# Patient Record
Sex: Female | Born: 1980
Health system: Southern US, Community
[De-identification: ages and names within clinical notes are randomized; demographics above are authoritative.]

## PROBLEM LIST (undated history)

## (undated) ENCOUNTER — Inpatient Hospital Stay (HOSPITAL_COMMUNITY): Payer: Self-pay

## (undated) VITALS — BP 103/75 | HR 125 | Temp 98.0°F | Resp 16 | Ht 64.25 in | Wt 166.0 lb

## (undated) DIAGNOSIS — Z87410 Personal history of cervical dysplasia: Secondary | ICD-10-CM

## (undated) DIAGNOSIS — B977 Papillomavirus as the cause of diseases classified elsewhere: Secondary | ICD-10-CM

## (undated) DIAGNOSIS — F250 Schizoaffective disorder, bipolar type: Secondary | ICD-10-CM

## (undated) DIAGNOSIS — Z8619 Personal history of other infectious and parasitic diseases: Secondary | ICD-10-CM

## (undated) DIAGNOSIS — N926 Irregular menstruation, unspecified: Secondary | ICD-10-CM

## (undated) DIAGNOSIS — Z8489 Family history of other specified conditions: Secondary | ICD-10-CM

## (undated) DIAGNOSIS — J4520 Mild intermittent asthma, uncomplicated: Secondary | ICD-10-CM

## (undated) DIAGNOSIS — K219 Gastro-esophageal reflux disease without esophagitis: Secondary | ICD-10-CM

## (undated) DIAGNOSIS — F32A Depression, unspecified: Secondary | ICD-10-CM

## (undated) DIAGNOSIS — L309 Dermatitis, unspecified: Secondary | ICD-10-CM

## (undated) DIAGNOSIS — M5136 Other intervertebral disc degeneration, lumbar region: Secondary | ICD-10-CM

## (undated) DIAGNOSIS — J189 Pneumonia, unspecified organism: Secondary | ICD-10-CM

## (undated) DIAGNOSIS — Z973 Presence of spectacles and contact lenses: Secondary | ICD-10-CM

## (undated) DIAGNOSIS — M25861 Other specified joint disorders, right knee: Secondary | ICD-10-CM

## (undated) DIAGNOSIS — R011 Cardiac murmur, unspecified: Secondary | ICD-10-CM

## (undated) DIAGNOSIS — M25561 Pain in right knee: Secondary | ICD-10-CM

## (undated) DIAGNOSIS — F329 Major depressive disorder, single episode, unspecified: Secondary | ICD-10-CM

## (undated) DIAGNOSIS — M7050 Other bursitis of knee, unspecified knee: Secondary | ICD-10-CM

## (undated) DIAGNOSIS — M7918 Myalgia, other site: Secondary | ICD-10-CM

## (undated) DIAGNOSIS — K259 Gastric ulcer, unspecified as acute or chronic, without hemorrhage or perforation: Secondary | ICD-10-CM

## (undated) DIAGNOSIS — R569 Unspecified convulsions: Secondary | ICD-10-CM

## (undated) DIAGNOSIS — F1011 Alcohol abuse, in remission: Secondary | ICD-10-CM

## (undated) DIAGNOSIS — Z9851 Tubal ligation status: Secondary | ICD-10-CM

## (undated) DIAGNOSIS — F259 Schizoaffective disorder, unspecified: Secondary | ICD-10-CM

## (undated) DIAGNOSIS — G894 Chronic pain syndrome: Secondary | ICD-10-CM

## (undated) DIAGNOSIS — F988 Other specified behavioral and emotional disorders with onset usually occurring in childhood and adolescence: Secondary | ICD-10-CM

## (undated) DIAGNOSIS — R87610 Atypical squamous cells of undetermined significance on cytologic smear of cervix (ASC-US): Secondary | ICD-10-CM

## (undated) DIAGNOSIS — L700 Acne vulgaris: Secondary | ICD-10-CM

## (undated) DIAGNOSIS — F3160 Bipolar disorder, current episode mixed, unspecified: Secondary | ICD-10-CM

## (undated) DIAGNOSIS — M545 Low back pain: Secondary | ICD-10-CM

## (undated) DIAGNOSIS — M25511 Pain in right shoulder: Secondary | ICD-10-CM

## (undated) DIAGNOSIS — Z8669 Personal history of other diseases of the nervous system and sense organs: Secondary | ICD-10-CM

## (undated) DIAGNOSIS — F17200 Nicotine dependence, unspecified, uncomplicated: Secondary | ICD-10-CM

## (undated) DIAGNOSIS — N760 Acute vaginitis: Secondary | ICD-10-CM

## (undated) DIAGNOSIS — N870 Mild cervical dysplasia: Secondary | ICD-10-CM

## (undated) DIAGNOSIS — I10 Essential (primary) hypertension: Secondary | ICD-10-CM

## (undated) DIAGNOSIS — M47817 Spondylosis without myelopathy or radiculopathy, lumbosacral region: Secondary | ICD-10-CM

## (undated) DIAGNOSIS — M25562 Pain in left knee: Secondary | ICD-10-CM

## (undated) DIAGNOSIS — M25512 Pain in left shoulder: Secondary | ICD-10-CM

## (undated) DIAGNOSIS — IMO0002 Reserved for concepts with insufficient information to code with codable children: Secondary | ICD-10-CM

## (undated) DIAGNOSIS — M47816 Spondylosis without myelopathy or radiculopathy, lumbar region: Secondary | ICD-10-CM

## (undated) DIAGNOSIS — B9689 Other specified bacterial agents as the cause of diseases classified elsewhere: Secondary | ICD-10-CM

## (undated) DIAGNOSIS — F4310 Post-traumatic stress disorder, unspecified: Secondary | ICD-10-CM

## (undated) DIAGNOSIS — M1990 Unspecified osteoarthritis, unspecified site: Secondary | ICD-10-CM

## (undated) DIAGNOSIS — Z8709 Personal history of other diseases of the respiratory system: Secondary | ICD-10-CM

## (undated) DIAGNOSIS — Z8659 Personal history of other mental and behavioral disorders: Secondary | ICD-10-CM

## (undated) DIAGNOSIS — Z8742 Personal history of other diseases of the female genital tract: Secondary | ICD-10-CM

## (undated) DIAGNOSIS — G8929 Other chronic pain: Secondary | ICD-10-CM

## (undated) DIAGNOSIS — L209 Atopic dermatitis, unspecified: Secondary | ICD-10-CM

## (undated) DIAGNOSIS — N951 Menopausal and female climacteric states: Secondary | ICD-10-CM

## (undated) DIAGNOSIS — H5021 Vertical strabismus, right eye: Secondary | ICD-10-CM

## (undated) DIAGNOSIS — M4057 Lordosis, unspecified, lumbosacral region: Secondary | ICD-10-CM

## (undated) DIAGNOSIS — A5400 Gonococcal infection of lower genitourinary tract, unspecified: Principal | ICD-10-CM

## (undated) DIAGNOSIS — Z7251 High risk heterosexual behavior: Secondary | ICD-10-CM

## (undated) DIAGNOSIS — I639 Cerebral infarction, unspecified: Secondary | ICD-10-CM

## (undated) DIAGNOSIS — F411 Generalized anxiety disorder: Secondary | ICD-10-CM

## (undated) DIAGNOSIS — F1010 Alcohol abuse, uncomplicated: Secondary | ICD-10-CM

## (undated) DIAGNOSIS — M5442 Lumbago with sciatica, left side: Secondary | ICD-10-CM

## (undated) DIAGNOSIS — E663 Overweight: Secondary | ICD-10-CM

## (undated) DIAGNOSIS — G549 Nerve root and plexus disorder, unspecified: Secondary | ICD-10-CM

## (undated) DIAGNOSIS — N920 Excessive and frequent menstruation with regular cycle: Secondary | ICD-10-CM

## (undated) DIAGNOSIS — M479 Spondylosis, unspecified: Secondary | ICD-10-CM

## (undated) HISTORY — DX: Acute vaginitis: N76.0

## (undated) HISTORY — DX: Personal history of cervical dysplasia: Z87.410

## (undated) HISTORY — DX: Chronic pain syndrome: G89.4

## (undated) HISTORY — DX: Pain in left shoulder: M25.512

## (undated) HISTORY — DX: Lordosis, unspecified, lumbosacral region: M40.57

## (undated) HISTORY — DX: Other intervertebral disc degeneration, lumbar region: M51.36

## (undated) HISTORY — DX: Schizoaffective disorder, bipolar type: F25.0

## (undated) HISTORY — DX: Personal history of other mental and behavioral disorders: Z86.59

## (undated) HISTORY — DX: Gonococcal infection of lower genitourinary tract, unspecified: A54.00

## (undated) HISTORY — DX: Pain in right shoulder: M25.511

## (undated) HISTORY — DX: Excessive and frequent menstruation with regular cycle: N92.0

## (undated) HISTORY — DX: Nicotine dependence, unspecified, uncomplicated: F17.200

## (undated) HISTORY — DX: Papillomavirus as the cause of diseases classified elsewhere: B97.7

## (undated) HISTORY — DX: Other specified joint disorders, right knee: M25.861

## (undated) HISTORY — DX: Alcohol abuse, uncomplicated: F10.10

## (undated) HISTORY — DX: Atopic dermatitis, unspecified: L20.9

## (undated) HISTORY — DX: Irregular menstruation, unspecified: N92.6

## (undated) HISTORY — DX: Gastro-esophageal reflux disease without esophagitis: K21.9

## (undated) HISTORY — DX: Bipolar disorder, current episode mixed, unspecified: F31.60

## (undated) HISTORY — DX: Essential (primary) hypertension: I10

## (undated) HISTORY — DX: Pain in left knee: M25.562

## (undated) HISTORY — DX: Acne vulgaris: L70.0

## (undated) HISTORY — DX: Tubal ligation status: Z98.51

## (undated) HISTORY — DX: Personal history of other diseases of the female genital tract: Z87.42

## (undated) HISTORY — DX: Generalized anxiety disorder: F41.1

## (undated) HISTORY — DX: Other bursitis of knee, unspecified knee: M70.50

## (undated) HISTORY — DX: Reserved for concepts with insufficient information to code with codable children: IMO0002

## (undated) HISTORY — DX: Lumbago with sciatica, left side: M54.42

## (undated) HISTORY — DX: Pain in right knee: M25.561

## (undated) HISTORY — DX: Personal history of other diseases of the respiratory system: Z87.09

## (undated) HISTORY — DX: Myalgia, other site: M79.18

## (undated) HISTORY — DX: Nerve root and plexus disorder, unspecified: G54.9

## (undated) HISTORY — DX: Spondylosis without myelopathy or radiculopathy, lumbosacral region: M47.817

## (undated) HISTORY — DX: Vertical strabismus, right eye: H50.21

## (undated) HISTORY — DX: Other chronic pain: G89.29

## (undated) HISTORY — DX: Other specified bacterial agents as the cause of diseases classified elsewhere: B96.89

## (undated) HISTORY — DX: Spondylosis, unspecified: M47.9

## (undated) HISTORY — DX: Personal history of other infectious and parasitic diseases: Z86.19

## (undated) NOTE — *Deleted (*Deleted)
    SUBJECTIVE:   CHIEF COMPLAINT / HPI:   [name] is a 8 year old female who presents with urinary incontinence  Urinary incontinence Patient endorses ** history of urinary incontinence. Denies dysuria, frequency, hematuria, urge, voiding.  Has never seen PCP for this.  Has never been seen urology  PERTINENT  PMH / PSH: Hypertension, asthma, GERD, eczema  OBJECTIVE:   LMP 09/05/2016 Comment: tubal ligation   General: Alert, no acute distress HEENT: Neck non-tender without lymphadenopathy, masses or thyromegaly Cardio: Normal S1 and S2, no S3 or S4. Rhythm is regular***. No murmurs or rubs.   Pulm: Clear to auscultation bilaterally, no crackles, wheezing, or diminished breath sounds. Normal respiratory effort Abdomen: Bowel sounds normal. Abdomen soft and non-tender.  Extremities: No peripheral edema. Warm/ well perfused.  Strong radial and pedal pulses***. Neuro: Cranial nerves grossly intact  ASSESSMENT/PLAN:   No problem-specific Assessment & Plan notes found for this encounter.     Towanda Octave, MD [facility]

---
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
## 1999-01-14 ENCOUNTER — Ambulatory Visit (HOSPITAL_COMMUNITY): Admission: RE | Admit: 1999-01-14 | Discharge: 1999-01-14 | Payer: Self-pay | Admitting: *Deleted

## 1999-01-19 ENCOUNTER — Other Ambulatory Visit: Admission: RE | Admit: 1999-01-19 | Discharge: 1999-01-19 | Payer: Self-pay | Admitting: Obstetrics & Gynecology

## 1999-03-17 ENCOUNTER — Other Ambulatory Visit: Admission: RE | Admit: 1999-03-17 | Discharge: 1999-03-17 | Payer: Self-pay | Admitting: Obstetrics and Gynecology

## 1999-03-17 ENCOUNTER — Inpatient Hospital Stay (HOSPITAL_COMMUNITY): Admission: AD | Admit: 1999-03-17 | Discharge: 1999-03-17 | Payer: Self-pay | Admitting: Obstetrics & Gynecology

## 1999-03-30 ENCOUNTER — Inpatient Hospital Stay (HOSPITAL_COMMUNITY): Admission: AD | Admit: 1999-03-30 | Discharge: 1999-03-30 | Payer: Self-pay | Admitting: *Deleted

## 1999-05-20 ENCOUNTER — Inpatient Hospital Stay (HOSPITAL_COMMUNITY): Admission: AD | Admit: 1999-05-20 | Discharge: 1999-05-20 | Payer: Self-pay | Admitting: Obstetrics and Gynecology

## 1999-06-02 ENCOUNTER — Inpatient Hospital Stay (HOSPITAL_COMMUNITY): Admission: AD | Admit: 1999-06-02 | Discharge: 1999-06-05 | Payer: Self-pay | Admitting: Obstetrics and Gynecology

## 1999-08-14 HISTORY — PX: STRABISMUS SURGERY: SHX218

## 1999-08-15 ENCOUNTER — Inpatient Hospital Stay (HOSPITAL_COMMUNITY): Admission: AD | Admit: 1999-08-15 | Discharge: 1999-08-15 | Payer: Self-pay | Admitting: Obstetrics and Gynecology

## 1999-09-08 ENCOUNTER — Encounter: Admission: RE | Admit: 1999-09-08 | Discharge: 1999-09-08 | Payer: Self-pay | Admitting: Sports Medicine

## 1999-09-23 ENCOUNTER — Emergency Department (HOSPITAL_COMMUNITY): Admission: EM | Admit: 1999-09-23 | Discharge: 1999-09-23 | Payer: Self-pay | Admitting: Emergency Medicine

## 1999-09-24 ENCOUNTER — Encounter: Payer: Self-pay | Admitting: Emergency Medicine

## 1999-09-25 ENCOUNTER — Encounter: Admission: RE | Admit: 1999-09-25 | Discharge: 1999-09-25 | Payer: Self-pay | Admitting: Family Medicine

## 1999-11-10 ENCOUNTER — Encounter: Admission: RE | Admit: 1999-11-10 | Discharge: 1999-11-10 | Payer: Self-pay | Admitting: Sports Medicine

## 1999-11-13 ENCOUNTER — Encounter: Admission: RE | Admit: 1999-11-13 | Discharge: 1999-11-13 | Payer: Self-pay | Admitting: Family Medicine

## 1999-11-18 ENCOUNTER — Encounter: Admission: RE | Admit: 1999-11-18 | Discharge: 1999-11-18 | Payer: Self-pay | Admitting: Family Medicine

## 1999-12-08 ENCOUNTER — Encounter: Admission: RE | Admit: 1999-12-08 | Discharge: 1999-12-08 | Payer: Self-pay | Admitting: Family Medicine

## 1999-12-21 ENCOUNTER — Encounter: Admission: RE | Admit: 1999-12-21 | Discharge: 1999-12-21 | Payer: Self-pay | Admitting: Family Medicine

## 2000-01-23 ENCOUNTER — Inpatient Hospital Stay (HOSPITAL_COMMUNITY): Admission: AD | Admit: 2000-01-23 | Discharge: 2000-01-23 | Payer: Self-pay | Admitting: *Deleted

## 2000-02-10 ENCOUNTER — Encounter: Admission: RE | Admit: 2000-02-10 | Discharge: 2000-02-10 | Payer: Self-pay | Admitting: Family Medicine

## 2000-03-09 ENCOUNTER — Encounter: Admission: RE | Admit: 2000-03-09 | Discharge: 2000-03-09 | Payer: Self-pay | Admitting: Family Medicine

## 2000-03-16 ENCOUNTER — Encounter: Admission: RE | Admit: 2000-03-16 | Discharge: 2000-03-16 | Payer: Self-pay | Admitting: Family Medicine

## 2000-04-04 ENCOUNTER — Emergency Department (HOSPITAL_COMMUNITY): Admission: EM | Admit: 2000-04-04 | Discharge: 2000-04-05 | Payer: Self-pay | Admitting: Emergency Medicine

## 2000-06-16 ENCOUNTER — Encounter: Admission: RE | Admit: 2000-06-16 | Discharge: 2000-06-16 | Payer: Self-pay | Admitting: Sports Medicine

## 2000-06-21 ENCOUNTER — Encounter: Admission: RE | Admit: 2000-06-21 | Discharge: 2000-06-21 | Payer: Self-pay | Admitting: Family Medicine

## 2000-06-23 ENCOUNTER — Encounter: Admission: RE | Admit: 2000-06-23 | Discharge: 2000-06-23 | Payer: Self-pay | Admitting: Family Medicine

## 2000-07-20 ENCOUNTER — Encounter: Admission: RE | Admit: 2000-07-20 | Discharge: 2000-07-20 | Payer: Self-pay | Admitting: Family Medicine

## 2000-07-20 ENCOUNTER — Other Ambulatory Visit: Admission: RE | Admit: 2000-07-20 | Discharge: 2000-07-20 | Payer: Self-pay | Admitting: Family Medicine

## 2000-07-29 ENCOUNTER — Encounter: Admission: RE | Admit: 2000-07-29 | Discharge: 2000-07-29 | Payer: Self-pay | Admitting: Family Medicine

## 2000-09-01 ENCOUNTER — Encounter: Admission: RE | Admit: 2000-09-01 | Discharge: 2000-09-01 | Payer: Self-pay | Admitting: Family Medicine

## 2000-09-13 ENCOUNTER — Encounter: Admission: RE | Admit: 2000-09-13 | Discharge: 2000-09-13 | Payer: Self-pay | Admitting: Family Medicine

## 2000-10-31 ENCOUNTER — Encounter: Admission: RE | Admit: 2000-10-31 | Discharge: 2000-10-31 | Payer: Self-pay | Admitting: Family Medicine

## 2000-11-02 ENCOUNTER — Encounter: Admission: RE | Admit: 2000-11-02 | Discharge: 2001-01-09 | Payer: Self-pay | Admitting: Family Medicine

## 2000-11-17 ENCOUNTER — Encounter: Admission: RE | Admit: 2000-11-17 | Discharge: 2000-11-17 | Payer: Self-pay | Admitting: Family Medicine

## 2000-11-24 ENCOUNTER — Encounter: Admission: RE | Admit: 2000-11-24 | Discharge: 2000-11-24 | Payer: Self-pay | Admitting: Family Medicine

## 2001-01-24 ENCOUNTER — Encounter: Admission: RE | Admit: 2001-01-24 | Discharge: 2001-01-24 | Payer: Self-pay | Admitting: Family Medicine

## 2001-01-27 ENCOUNTER — Encounter: Admission: RE | Admit: 2001-01-27 | Discharge: 2001-01-27 | Payer: Self-pay | Admitting: Family Medicine

## 2001-02-27 ENCOUNTER — Encounter: Admission: RE | Admit: 2001-02-27 | Discharge: 2001-02-27 | Payer: Self-pay | Admitting: Family Medicine

## 2001-03-30 ENCOUNTER — Encounter: Admission: RE | Admit: 2001-03-30 | Discharge: 2001-03-30 | Payer: Self-pay | Admitting: Family Medicine

## 2001-05-15 ENCOUNTER — Encounter: Admission: RE | Admit: 2001-05-15 | Discharge: 2001-05-15 | Payer: Self-pay | Admitting: Family Medicine

## 2001-05-24 ENCOUNTER — Emergency Department (HOSPITAL_COMMUNITY): Admission: EM | Admit: 2001-05-24 | Discharge: 2001-05-24 | Payer: Self-pay | Admitting: Emergency Medicine

## 2001-05-28 ENCOUNTER — Emergency Department (HOSPITAL_COMMUNITY): Admission: EM | Admit: 2001-05-28 | Discharge: 2001-05-28 | Payer: Self-pay | Admitting: Emergency Medicine

## 2001-07-03 ENCOUNTER — Ambulatory Visit: Admission: RE | Admit: 2001-07-03 | Discharge: 2001-07-03 | Payer: Self-pay | Admitting: Family Medicine

## 2001-07-03 ENCOUNTER — Encounter: Admission: RE | Admit: 2001-07-03 | Discharge: 2001-07-03 | Payer: Self-pay | Admitting: Family Medicine

## 2001-11-29 ENCOUNTER — Encounter: Payer: Self-pay | Admitting: Emergency Medicine

## 2001-11-29 ENCOUNTER — Emergency Department (HOSPITAL_COMMUNITY): Admission: EM | Admit: 2001-11-29 | Discharge: 2001-11-29 | Payer: Self-pay | Admitting: Emergency Medicine

## 2002-02-12 ENCOUNTER — Encounter: Admission: RE | Admit: 2002-02-12 | Discharge: 2002-02-12 | Payer: Self-pay | Admitting: Family Medicine

## 2002-03-21 ENCOUNTER — Encounter: Admission: RE | Admit: 2002-03-21 | Discharge: 2002-03-21 | Payer: Self-pay | Admitting: Family Medicine

## 2002-04-05 ENCOUNTER — Encounter: Admission: RE | Admit: 2002-04-05 | Discharge: 2002-04-05 | Payer: Self-pay | Admitting: Family Medicine

## 2002-05-04 ENCOUNTER — Encounter: Admission: RE | Admit: 2002-05-04 | Discharge: 2002-05-04 | Payer: Self-pay | Admitting: Family Medicine

## 2002-05-14 ENCOUNTER — Encounter: Admission: RE | Admit: 2002-05-14 | Discharge: 2002-05-14 | Payer: Self-pay | Admitting: Family Medicine

## 2002-06-19 ENCOUNTER — Encounter (INDEPENDENT_AMBULATORY_CARE_PROVIDER_SITE_OTHER): Payer: Self-pay | Admitting: *Deleted

## 2002-06-19 ENCOUNTER — Encounter: Admission: RE | Admit: 2002-06-19 | Discharge: 2002-06-19 | Payer: Self-pay | Admitting: Family Medicine

## 2002-06-21 ENCOUNTER — Encounter: Admission: RE | Admit: 2002-06-21 | Discharge: 2002-06-21 | Payer: Self-pay | Admitting: Family Medicine

## 2002-06-28 ENCOUNTER — Encounter: Admission: RE | Admit: 2002-06-28 | Discharge: 2002-06-28 | Payer: Self-pay | Admitting: Family Medicine

## 2002-07-19 ENCOUNTER — Encounter: Admission: RE | Admit: 2002-07-19 | Discharge: 2002-07-19 | Payer: Self-pay | Admitting: Family Medicine

## 2002-08-09 ENCOUNTER — Encounter: Admission: RE | Admit: 2002-08-09 | Discharge: 2002-08-09 | Payer: Self-pay | Admitting: Family Medicine

## 2002-09-06 ENCOUNTER — Encounter: Admission: RE | Admit: 2002-09-06 | Discharge: 2002-09-06 | Payer: Self-pay | Admitting: Family Medicine

## 2002-10-16 ENCOUNTER — Encounter: Admission: RE | Admit: 2002-10-16 | Discharge: 2002-10-16 | Payer: Self-pay | Admitting: Family Medicine

## 2002-11-06 ENCOUNTER — Encounter: Admission: RE | Admit: 2002-11-06 | Discharge: 2002-11-06 | Payer: Self-pay | Admitting: Sports Medicine

## 2002-11-27 ENCOUNTER — Encounter: Admission: RE | Admit: 2002-11-27 | Discharge: 2002-11-27 | Payer: Self-pay | Admitting: Sports Medicine

## 2002-11-29 ENCOUNTER — Encounter: Admission: RE | Admit: 2002-11-29 | Discharge: 2002-11-29 | Payer: Self-pay | Admitting: Family Medicine

## 2003-01-22 ENCOUNTER — Encounter: Admission: RE | Admit: 2003-01-22 | Discharge: 2003-01-22 | Payer: Self-pay | Admitting: Family Medicine

## 2003-02-04 ENCOUNTER — Encounter: Admission: RE | Admit: 2003-02-04 | Discharge: 2003-02-04 | Payer: Self-pay | Admitting: Family Medicine

## 2003-02-11 ENCOUNTER — Encounter: Admission: RE | Admit: 2003-02-11 | Discharge: 2003-02-11 | Payer: Self-pay | Admitting: Family Medicine

## 2003-04-12 ENCOUNTER — Encounter: Admission: RE | Admit: 2003-04-12 | Discharge: 2003-04-12 | Payer: Self-pay | Admitting: Sports Medicine

## 2003-05-16 ENCOUNTER — Encounter: Admission: RE | Admit: 2003-05-16 | Discharge: 2003-05-16 | Payer: Self-pay | Admitting: Family Medicine

## 2003-07-05 ENCOUNTER — Encounter: Admission: RE | Admit: 2003-07-05 | Discharge: 2003-07-05 | Payer: Self-pay | Admitting: Family Medicine

## 2003-07-25 ENCOUNTER — Emergency Department (HOSPITAL_COMMUNITY): Admission: EM | Admit: 2003-07-25 | Discharge: 2003-07-25 | Payer: Self-pay | Admitting: Family Medicine

## 2003-12-02 ENCOUNTER — Inpatient Hospital Stay (HOSPITAL_COMMUNITY): Admission: AD | Admit: 2003-12-02 | Discharge: 2003-12-02 | Payer: Self-pay | Admitting: Obstetrics & Gynecology

## 2003-12-23 ENCOUNTER — Ambulatory Visit: Payer: Self-pay | Admitting: Family Medicine

## 2003-12-24 ENCOUNTER — Inpatient Hospital Stay (HOSPITAL_COMMUNITY): Admission: AD | Admit: 2003-12-24 | Discharge: 2003-12-24 | Payer: Self-pay | Admitting: *Deleted

## 2004-01-15 ENCOUNTER — Inpatient Hospital Stay (HOSPITAL_COMMUNITY): Admission: AD | Admit: 2004-01-15 | Discharge: 2004-01-15 | Payer: Self-pay | Admitting: Obstetrics and Gynecology

## 2004-01-21 ENCOUNTER — Ambulatory Visit: Payer: Self-pay | Admitting: Family Medicine

## 2004-02-04 ENCOUNTER — Ambulatory Visit: Payer: Self-pay | Admitting: Family Medicine

## 2004-02-20 ENCOUNTER — Ambulatory Visit: Payer: Self-pay | Admitting: Family Medicine

## 2004-02-27 ENCOUNTER — Ambulatory Visit (HOSPITAL_COMMUNITY): Admission: RE | Admit: 2004-02-27 | Discharge: 2004-02-27 | Payer: Self-pay | Admitting: Family Medicine

## 2004-03-10 ENCOUNTER — Inpatient Hospital Stay (HOSPITAL_COMMUNITY): Admission: AD | Admit: 2004-03-10 | Discharge: 2004-03-10 | Payer: Self-pay | Admitting: Obstetrics and Gynecology

## 2004-04-03 ENCOUNTER — Ambulatory Visit: Payer: Self-pay | Admitting: Family Medicine

## 2004-04-03 ENCOUNTER — Inpatient Hospital Stay (HOSPITAL_COMMUNITY): Admission: AD | Admit: 2004-04-03 | Discharge: 2004-04-06 | Payer: Self-pay | Admitting: *Deleted

## 2004-04-03 ENCOUNTER — Ambulatory Visit: Payer: Self-pay | Admitting: *Deleted

## 2004-04-07 ENCOUNTER — Ambulatory Visit: Payer: Self-pay | Admitting: Family Medicine

## 2004-04-20 ENCOUNTER — Inpatient Hospital Stay (HOSPITAL_COMMUNITY): Admission: AD | Admit: 2004-04-20 | Discharge: 2004-04-23 | Payer: Self-pay | Admitting: Obstetrics and Gynecology

## 2004-04-20 ENCOUNTER — Ambulatory Visit: Payer: Self-pay | Admitting: Family Medicine

## 2004-04-28 ENCOUNTER — Ambulatory Visit: Payer: Self-pay | Admitting: Family Medicine

## 2004-04-29 ENCOUNTER — Ambulatory Visit: Payer: Self-pay | Admitting: Sports Medicine

## 2004-05-01 ENCOUNTER — Encounter (INDEPENDENT_AMBULATORY_CARE_PROVIDER_SITE_OTHER): Payer: Self-pay | Admitting: *Deleted

## 2004-05-01 ENCOUNTER — Inpatient Hospital Stay (HOSPITAL_COMMUNITY): Admission: AD | Admit: 2004-05-01 | Discharge: 2004-05-03 | Payer: Self-pay | Admitting: Obstetrics and Gynecology

## 2004-05-06 ENCOUNTER — Inpatient Hospital Stay (HOSPITAL_COMMUNITY): Admission: AD | Admit: 2004-05-06 | Discharge: 2004-05-06 | Payer: Self-pay | Admitting: Obstetrics and Gynecology

## 2004-05-06 ENCOUNTER — Ambulatory Visit: Payer: Self-pay | Admitting: Family Medicine

## 2004-06-22 ENCOUNTER — Other Ambulatory Visit: Admission: RE | Admit: 2004-06-22 | Discharge: 2004-06-22 | Payer: Self-pay | Admitting: Family Medicine

## 2004-06-22 ENCOUNTER — Ambulatory Visit: Payer: Self-pay | Admitting: Sports Medicine

## 2004-11-19 ENCOUNTER — Ambulatory Visit: Payer: Self-pay | Admitting: Family Medicine

## 2004-11-26 ENCOUNTER — Ambulatory Visit: Payer: Self-pay | Admitting: Family Medicine

## 2004-12-21 ENCOUNTER — Ambulatory Visit: Payer: Self-pay | Admitting: Family Medicine

## 2004-12-21 ENCOUNTER — Other Ambulatory Visit: Admission: RE | Admit: 2004-12-21 | Discharge: 2004-12-21 | Payer: Self-pay | Admitting: Family Medicine

## 2004-12-24 ENCOUNTER — Ambulatory Visit: Payer: Self-pay | Admitting: Family Medicine

## 2005-01-28 ENCOUNTER — Ambulatory Visit: Payer: Self-pay | Admitting: Family Medicine

## 2005-03-01 ENCOUNTER — Ambulatory Visit: Payer: Self-pay | Admitting: Family Medicine

## 2005-03-05 ENCOUNTER — Ambulatory Visit: Payer: Self-pay | Admitting: Family Medicine

## 2005-04-10 ENCOUNTER — Emergency Department (HOSPITAL_COMMUNITY): Admission: EM | Admit: 2005-04-10 | Discharge: 2005-04-10 | Payer: Self-pay | Admitting: Podiatry

## 2005-04-19 ENCOUNTER — Emergency Department (HOSPITAL_COMMUNITY): Admission: EM | Admit: 2005-04-19 | Discharge: 2005-04-19 | Payer: Self-pay | Admitting: Family Medicine

## 2005-09-24 ENCOUNTER — Emergency Department (HOSPITAL_COMMUNITY): Admission: EM | Admit: 2005-09-24 | Discharge: 2005-09-24 | Payer: Self-pay | Admitting: Family Medicine

## 2005-10-13 ENCOUNTER — Encounter (INDEPENDENT_AMBULATORY_CARE_PROVIDER_SITE_OTHER): Payer: Self-pay | Admitting: *Deleted

## 2005-10-28 ENCOUNTER — Ambulatory Visit: Payer: Self-pay | Admitting: Family Medicine

## 2005-10-28 ENCOUNTER — Encounter (INDEPENDENT_AMBULATORY_CARE_PROVIDER_SITE_OTHER): Payer: Self-pay | Admitting: *Deleted

## 2005-11-01 ENCOUNTER — Emergency Department (HOSPITAL_COMMUNITY): Admission: EM | Admit: 2005-11-01 | Discharge: 2005-11-01 | Payer: Self-pay | Admitting: Emergency Medicine

## 2005-11-01 ENCOUNTER — Ambulatory Visit: Payer: Self-pay | Admitting: Family Medicine

## 2005-11-08 ENCOUNTER — Ambulatory Visit: Payer: Self-pay | Admitting: Family Medicine

## 2005-11-23 ENCOUNTER — Encounter: Admission: RE | Admit: 2005-11-23 | Discharge: 2005-11-23 | Payer: Self-pay | Admitting: Sports Medicine

## 2005-11-23 ENCOUNTER — Ambulatory Visit: Payer: Self-pay | Admitting: Sports Medicine

## 2005-12-02 ENCOUNTER — Ambulatory Visit: Payer: Self-pay | Admitting: Family Medicine

## 2006-02-02 ENCOUNTER — Ambulatory Visit: Payer: Self-pay | Admitting: Family Medicine

## 2006-02-21 ENCOUNTER — Emergency Department (HOSPITAL_COMMUNITY): Admission: EM | Admit: 2006-02-21 | Discharge: 2006-02-21 | Payer: Self-pay | Admitting: Emergency Medicine

## 2006-04-26 ENCOUNTER — Encounter (INDEPENDENT_AMBULATORY_CARE_PROVIDER_SITE_OTHER): Payer: Self-pay | Admitting: *Deleted

## 2006-04-26 ENCOUNTER — Ambulatory Visit: Payer: Self-pay | Admitting: Family Medicine

## 2006-04-26 LAB — CONVERTED CEMR LAB
Albumin: 4.7 g/dL (ref 3.5–5.2)
BUN: 6 mg/dL (ref 6–23)
Calcium: 9.5 mg/dL (ref 8.4–10.5)
Chloride: 97 meq/L (ref 96–112)
Creatinine, Ser: 0.78 mg/dL (ref 0.40–1.20)
Glucose, Bld: 72 mg/dL (ref 70–99)
Potassium: 3.5 meq/L (ref 3.5–5.3)
Prealbumin: 17.7 mg/dL — ABNORMAL LOW (ref 18.0–45.0)
Sodium: 137 meq/L (ref 135–145)
Total Protein: 7.5 g/dL (ref 6.0–8.3)

## 2006-05-12 ENCOUNTER — Encounter (INDEPENDENT_AMBULATORY_CARE_PROVIDER_SITE_OTHER): Payer: Self-pay | Admitting: Family Medicine

## 2006-05-12 ENCOUNTER — Other Ambulatory Visit: Admission: RE | Admit: 2006-05-12 | Discharge: 2006-05-12 | Payer: Self-pay | Admitting: Family Medicine

## 2006-05-12 ENCOUNTER — Encounter: Payer: Self-pay | Admitting: Family Medicine

## 2006-05-12 ENCOUNTER — Ambulatory Visit: Payer: Self-pay | Admitting: Family Medicine

## 2006-05-12 DIAGNOSIS — M47816 Spondylosis without myelopathy or radiculopathy, lumbar region: Secondary | ICD-10-CM | POA: Insufficient documentation

## 2006-05-12 DIAGNOSIS — Z8709 Personal history of other diseases of the respiratory system: Secondary | ICD-10-CM

## 2006-05-12 DIAGNOSIS — Z720 Tobacco use: Secondary | ICD-10-CM | POA: Insufficient documentation

## 2006-05-12 DIAGNOSIS — M479 Spondylosis, unspecified: Secondary | ICD-10-CM

## 2006-05-12 DIAGNOSIS — J4520 Mild intermittent asthma, uncomplicated: Secondary | ICD-10-CM | POA: Insufficient documentation

## 2006-05-12 DIAGNOSIS — F988 Other specified behavioral and emotional disorders with onset usually occurring in childhood and adolescence: Secondary | ICD-10-CM | POA: Insufficient documentation

## 2006-05-12 DIAGNOSIS — Z8659 Personal history of other mental and behavioral disorders: Secondary | ICD-10-CM | POA: Insufficient documentation

## 2006-05-12 DIAGNOSIS — F17200 Nicotine dependence, unspecified, uncomplicated: Secondary | ICD-10-CM

## 2006-05-12 HISTORY — DX: Spondylosis without myelopathy or radiculopathy, lumbar region: M47.816

## 2006-05-12 HISTORY — DX: Personal history of other mental and behavioral disorders: Z86.59

## 2006-05-12 HISTORY — DX: Spondylosis, unspecified: M47.9

## 2006-05-12 HISTORY — DX: Other specified behavioral and emotional disorders with onset usually occurring in childhood and adolescence: F98.8

## 2006-05-12 HISTORY — DX: Nicotine dependence, unspecified, uncomplicated: F17.200

## 2006-05-12 HISTORY — DX: Personal history of other diseases of the respiratory system: Z87.09

## 2006-05-12 LAB — CONVERTED CEMR LAB
AST: 11 units/L (ref 0–37)
Alkaline Phosphatase: 53 units/L (ref 39–117)
BUN: 9 mg/dL (ref 6–23)
CO2: 29 meq/L (ref 19–32)
Chlamydia, DNA Probe: NEGATIVE
Glucose, Bld: 79 mg/dL (ref 70–99)
Pap Smear: NORMAL
Sodium: 139 meq/L (ref 135–145)
Total Bilirubin: 0.7 mg/dL (ref 0.3–1.2)
Total Protein: 7.3 g/dL (ref 6.0–8.3)

## 2006-05-13 ENCOUNTER — Encounter (INDEPENDENT_AMBULATORY_CARE_PROVIDER_SITE_OTHER): Payer: Self-pay | Admitting: *Deleted

## 2006-05-13 ENCOUNTER — Ambulatory Visit (HOSPITAL_COMMUNITY): Admission: RE | Admit: 2006-05-13 | Discharge: 2006-05-13 | Payer: Self-pay | Admitting: Family Medicine

## 2006-05-18 ENCOUNTER — Telehealth: Payer: Self-pay | Admitting: Family Medicine

## 2006-05-24 ENCOUNTER — Ambulatory Visit: Payer: Self-pay | Admitting: Family Medicine

## 2006-05-25 ENCOUNTER — Encounter: Payer: Self-pay | Admitting: Family Medicine

## 2006-05-25 ENCOUNTER — Telehealth: Payer: Self-pay | Admitting: *Deleted

## 2006-05-25 LAB — CONVERTED CEMR LAB
Amphetamine Screen, Ur: NEGATIVE
Barbiturate Quant, Ur: NEGATIVE
Benzodiazepines.: NEGATIVE
Methadone: NEGATIVE
Phencyclidine (PCP): NEGATIVE
Propoxyphene: NEGATIVE

## 2006-05-26 ENCOUNTER — Ambulatory Visit: Payer: Self-pay | Admitting: Family Medicine

## 2006-06-14 HISTORY — PX: COLONOSCOPY: SHX174

## 2006-06-14 HISTORY — PX: ESOPHAGOGASTRODUODENOSCOPY: SHX1529

## 2006-06-20 ENCOUNTER — Encounter: Payer: Self-pay | Admitting: Family Medicine

## 2006-06-20 DIAGNOSIS — Z8619 Personal history of other infectious and parasitic diseases: Secondary | ICD-10-CM

## 2006-06-20 DIAGNOSIS — N72 Inflammatory disease of cervix uteri: Secondary | ICD-10-CM | POA: Insufficient documentation

## 2006-06-20 DIAGNOSIS — A5403 Gonococcal cervicitis, unspecified: Secondary | ICD-10-CM | POA: Insufficient documentation

## 2006-06-20 HISTORY — DX: Personal history of other infectious and parasitic diseases: Z86.19

## 2006-06-23 ENCOUNTER — Ambulatory Visit: Payer: Self-pay | Admitting: Internal Medicine

## 2006-07-01 ENCOUNTER — Encounter: Payer: Self-pay | Admitting: Family Medicine

## 2006-07-01 ENCOUNTER — Ambulatory Visit (HOSPITAL_COMMUNITY): Admission: RE | Admit: 2006-07-01 | Discharge: 2006-07-01 | Payer: Self-pay | Admitting: Internal Medicine

## 2006-07-12 ENCOUNTER — Ambulatory Visit: Payer: Self-pay | Admitting: Family Medicine

## 2006-07-12 ENCOUNTER — Telehealth: Payer: Self-pay | Admitting: *Deleted

## 2006-07-12 ENCOUNTER — Encounter (INDEPENDENT_AMBULATORY_CARE_PROVIDER_SITE_OTHER): Payer: Self-pay | Admitting: Family Medicine

## 2006-07-12 LAB — CONVERTED CEMR LAB
Albumin: 4.6 g/dL (ref 3.5–5.2)
BUN: 6 mg/dL (ref 6–23)
Calcium: 9 mg/dL (ref 8.4–10.5)
Chloride: 102 meq/L (ref 96–112)
Eosinophils Absolute: 0.7 10*3/uL
Glucose, Bld: 62 mg/dL — ABNORMAL LOW (ref 70–99)
Granulocyte percent: 50.7 %
HCT: 41.5 %
Lipase: <10 units/L (ref 0–75)
Lymphs Abs: 3.4 10*3/uL
Monocytes Relative: 3.4 %
Platelets: 222 10*3/uL
Potassium: 3.8 meq/L (ref 3.5–5.3)
WBC: 7.3 10*3/uL

## 2006-07-14 ENCOUNTER — Ambulatory Visit: Payer: Self-pay | Admitting: Sports Medicine

## 2006-07-19 ENCOUNTER — Encounter: Payer: Self-pay | Admitting: Family Medicine

## 2006-08-04 ENCOUNTER — Telehealth: Payer: Self-pay | Admitting: *Deleted

## 2006-08-09 ENCOUNTER — Telehealth: Payer: Self-pay | Admitting: *Deleted

## 2006-08-10 ENCOUNTER — Ambulatory Visit: Payer: Self-pay | Admitting: Family Medicine

## 2006-08-10 LAB — CONVERTED CEMR LAB: Beta hcg, urine, semiquantitative: NEGATIVE

## 2006-08-15 ENCOUNTER — Ambulatory Visit: Payer: Self-pay | Admitting: Family Medicine

## 2006-08-16 ENCOUNTER — Encounter: Admission: RE | Admit: 2006-08-16 | Discharge: 2006-08-16 | Payer: Self-pay | Admitting: Sports Medicine

## 2006-08-16 ENCOUNTER — Encounter: Payer: Self-pay | Admitting: Family Medicine

## 2006-09-13 ENCOUNTER — Ambulatory Visit: Payer: Self-pay | Admitting: Family Medicine

## 2006-09-13 ENCOUNTER — Encounter (INDEPENDENT_AMBULATORY_CARE_PROVIDER_SITE_OTHER): Payer: Self-pay | Admitting: Family Medicine

## 2006-09-13 LAB — CONVERTED CEMR LAB
Basophils Absolute: 0 10*3/uL (ref 0.0–0.1)
Basophils Relative: 0 % (ref 0–1)
Eosinophils Absolute: 0 10*3/uL (ref 0.0–0.7)
Eosinophils Relative: 1 % (ref 0–5)
HCT: 39.3 % (ref 36.0–46.0)
Hemoglobin: 13.5 g/dL (ref 12.0–15.0)
Lymphocytes Relative: 45 % (ref 12–46)
MCHC: 34.4 g/dL (ref 30.0–36.0)
MCV: 86.6 fL (ref 78.0–100.0)
Monocytes Absolute: 0.3 10*3/uL (ref 0.2–0.7)
RDW: 13.5 % (ref 11.5–14.0)
Urine culture (CF units/mL): NEGATIVE CFunits/mL

## 2006-09-23 ENCOUNTER — Ambulatory Visit: Payer: Self-pay | Admitting: Family Medicine

## 2006-09-23 ENCOUNTER — Telehealth: Payer: Self-pay | Admitting: *Deleted

## 2006-09-23 LAB — CONVERTED CEMR LAB
Bilirubin Urine: NEGATIVE
Specific Gravity, Urine: 1.02
Urobilinogen, UA: 4
pH: 7

## 2006-10-05 ENCOUNTER — Inpatient Hospital Stay (HOSPITAL_COMMUNITY): Admission: AD | Admit: 2006-10-05 | Discharge: 2006-10-05 | Payer: Self-pay | Admitting: Gynecology

## 2006-10-05 ENCOUNTER — Encounter (INDEPENDENT_AMBULATORY_CARE_PROVIDER_SITE_OTHER): Payer: Self-pay | Admitting: Family Medicine

## 2006-10-05 ENCOUNTER — Encounter: Payer: Self-pay | Admitting: *Deleted

## 2006-10-06 ENCOUNTER — Ambulatory Visit: Payer: Self-pay | Admitting: Family Medicine

## 2006-10-06 ENCOUNTER — Encounter: Payer: Self-pay | Admitting: Family Medicine

## 2006-10-06 LAB — CONVERTED CEMR LAB: Chlamydia, DNA Probe: NEGATIVE

## 2006-10-07 ENCOUNTER — Telehealth (INDEPENDENT_AMBULATORY_CARE_PROVIDER_SITE_OTHER): Payer: Self-pay | Admitting: Family Medicine

## 2006-10-13 ENCOUNTER — Ambulatory Visit: Payer: Self-pay | Admitting: Family Medicine

## 2006-10-13 ENCOUNTER — Telehealth: Payer: Self-pay | Admitting: *Deleted

## 2006-10-13 LAB — CONVERTED CEMR LAB
Bilirubin Urine: NEGATIVE
Glucose, Urine, Semiquant: NEGATIVE
Specific Gravity, Urine: >=1.03
pH: 6

## 2006-10-17 ENCOUNTER — Telehealth (INDEPENDENT_AMBULATORY_CARE_PROVIDER_SITE_OTHER): Payer: Self-pay | Admitting: Family Medicine

## 2006-10-19 ENCOUNTER — Telehealth (INDEPENDENT_AMBULATORY_CARE_PROVIDER_SITE_OTHER): Payer: Self-pay | Admitting: Family Medicine

## 2006-10-20 ENCOUNTER — Telehealth (INDEPENDENT_AMBULATORY_CARE_PROVIDER_SITE_OTHER): Payer: Self-pay | Admitting: Family Medicine

## 2006-10-21 ENCOUNTER — Telehealth: Payer: Self-pay | Admitting: Family Medicine

## 2006-10-21 ENCOUNTER — Telehealth (INDEPENDENT_AMBULATORY_CARE_PROVIDER_SITE_OTHER): Payer: Self-pay | Admitting: Family Medicine

## 2006-10-24 ENCOUNTER — Telehealth: Payer: Self-pay | Admitting: *Deleted

## 2006-10-24 ENCOUNTER — Telehealth (INDEPENDENT_AMBULATORY_CARE_PROVIDER_SITE_OTHER): Payer: Self-pay | Admitting: Family Medicine

## 2006-10-26 ENCOUNTER — Ambulatory Visit: Payer: Self-pay | Admitting: Family Medicine

## 2006-10-26 LAB — CONVERTED CEMR LAB
Blood in Urine, dipstick: NEGATIVE
Nitrite: NEGATIVE
Specific Gravity, Urine: 1.025
Urobilinogen, UA: 1

## 2006-10-27 ENCOUNTER — Ambulatory Visit: Payer: Self-pay | Admitting: Family Medicine

## 2006-10-27 ENCOUNTER — Inpatient Hospital Stay (HOSPITAL_COMMUNITY): Admission: AD | Admit: 2006-10-27 | Discharge: 2006-10-27 | Payer: Self-pay | Admitting: Obstetrics and Gynecology

## 2006-10-27 LAB — CONVERTED CEMR LAB: Glucose, Urine, Semiquant: NEGATIVE

## 2006-10-28 ENCOUNTER — Encounter (INDEPENDENT_AMBULATORY_CARE_PROVIDER_SITE_OTHER): Payer: Self-pay | Admitting: Family Medicine

## 2006-10-31 ENCOUNTER — Telehealth (INDEPENDENT_AMBULATORY_CARE_PROVIDER_SITE_OTHER): Payer: Self-pay | Admitting: Family Medicine

## 2006-11-03 ENCOUNTER — Ambulatory Visit: Payer: Self-pay | Admitting: Family Medicine

## 2006-11-04 ENCOUNTER — Encounter (INDEPENDENT_AMBULATORY_CARE_PROVIDER_SITE_OTHER): Payer: Self-pay | Admitting: Family Medicine

## 2006-11-04 ENCOUNTER — Telehealth: Payer: Self-pay | Admitting: *Deleted

## 2006-11-16 ENCOUNTER — Encounter: Payer: Self-pay | Admitting: *Deleted

## 2006-11-17 ENCOUNTER — Ambulatory Visit (HOSPITAL_COMMUNITY): Admission: RE | Admit: 2006-11-17 | Discharge: 2006-11-17 | Payer: Self-pay | Admitting: Family Medicine

## 2006-11-17 ENCOUNTER — Telehealth (INDEPENDENT_AMBULATORY_CARE_PROVIDER_SITE_OTHER): Payer: Self-pay | Admitting: Family Medicine

## 2006-11-17 ENCOUNTER — Encounter (INDEPENDENT_AMBULATORY_CARE_PROVIDER_SITE_OTHER): Payer: Self-pay | Admitting: Family Medicine

## 2006-11-21 ENCOUNTER — Encounter (INDEPENDENT_AMBULATORY_CARE_PROVIDER_SITE_OTHER): Payer: Self-pay | Admitting: Family Medicine

## 2006-11-23 ENCOUNTER — Ambulatory Visit: Payer: Self-pay | Admitting: Family Medicine

## 2006-11-23 ENCOUNTER — Telehealth (INDEPENDENT_AMBULATORY_CARE_PROVIDER_SITE_OTHER): Payer: Self-pay | Admitting: Family Medicine

## 2006-11-23 DIAGNOSIS — G44209 Tension-type headache, unspecified, not intractable: Secondary | ICD-10-CM | POA: Insufficient documentation

## 2006-11-29 ENCOUNTER — Telehealth (INDEPENDENT_AMBULATORY_CARE_PROVIDER_SITE_OTHER): Payer: Self-pay | Admitting: Family Medicine

## 2006-11-30 ENCOUNTER — Ambulatory Visit (HOSPITAL_COMMUNITY): Admission: RE | Admit: 2006-11-30 | Discharge: 2006-11-30 | Payer: Self-pay | Admitting: Family Medicine

## 2006-12-05 ENCOUNTER — Encounter (INDEPENDENT_AMBULATORY_CARE_PROVIDER_SITE_OTHER): Payer: Self-pay | Admitting: Family Medicine

## 2006-12-05 ENCOUNTER — Ambulatory Visit: Payer: Self-pay | Admitting: Family Medicine

## 2006-12-05 LAB — CONVERTED CEMR LAB
Bilirubin Urine: NEGATIVE
Chlamydia, DNA Probe: NEGATIVE
Epithelial cells, urine: >20 /lpf
Nitrite: NEGATIVE
Protein, U semiquant: 30
Specific Gravity, Urine: >=1.03

## 2006-12-06 ENCOUNTER — Encounter (INDEPENDENT_AMBULATORY_CARE_PROVIDER_SITE_OTHER): Payer: Self-pay | Admitting: Family Medicine

## 2006-12-12 ENCOUNTER — Encounter: Payer: Self-pay | Admitting: *Deleted

## 2006-12-15 ENCOUNTER — Ambulatory Visit: Payer: Self-pay | Admitting: Obstetrics & Gynecology

## 2006-12-16 ENCOUNTER — Ambulatory Visit: Payer: Self-pay | Admitting: Gynecology

## 2006-12-16 ENCOUNTER — Ambulatory Visit (HOSPITAL_COMMUNITY): Admission: RE | Admit: 2006-12-16 | Discharge: 2006-12-16 | Payer: Self-pay | Admitting: Family Medicine

## 2006-12-20 ENCOUNTER — Encounter (INDEPENDENT_AMBULATORY_CARE_PROVIDER_SITE_OTHER): Payer: Self-pay | Admitting: Family Medicine

## 2006-12-23 ENCOUNTER — Ambulatory Visit: Payer: Self-pay | Admitting: Obstetrics & Gynecology

## 2006-12-26 ENCOUNTER — Encounter (INDEPENDENT_AMBULATORY_CARE_PROVIDER_SITE_OTHER): Payer: Self-pay | Admitting: Family Medicine

## 2006-12-30 ENCOUNTER — Ambulatory Visit: Payer: Self-pay | Admitting: Obstetrics and Gynecology

## 2007-01-02 ENCOUNTER — Ambulatory Visit: Payer: Self-pay | Admitting: Family Medicine

## 2007-01-02 ENCOUNTER — Encounter: Payer: Self-pay | Admitting: Family Medicine

## 2007-01-06 ENCOUNTER — Ambulatory Visit: Payer: Self-pay | Admitting: Family Medicine

## 2007-01-06 ENCOUNTER — Ambulatory Visit: Payer: Self-pay | Admitting: Obstetrics & Gynecology

## 2007-01-06 ENCOUNTER — Encounter (INDEPENDENT_AMBULATORY_CARE_PROVIDER_SITE_OTHER): Payer: Self-pay | Admitting: Family Medicine

## 2007-01-13 ENCOUNTER — Encounter: Payer: Self-pay | Admitting: *Deleted

## 2007-01-13 ENCOUNTER — Observation Stay (HOSPITAL_COMMUNITY): Admission: AD | Admit: 2007-01-13 | Discharge: 2007-01-14 | Payer: Self-pay | Admitting: Obstetrics and Gynecology

## 2007-01-13 ENCOUNTER — Ambulatory Visit: Payer: Self-pay | Admitting: Obstetrics & Gynecology

## 2007-01-13 ENCOUNTER — Encounter (INDEPENDENT_AMBULATORY_CARE_PROVIDER_SITE_OTHER): Payer: Self-pay | Admitting: Family Medicine

## 2007-01-13 ENCOUNTER — Ambulatory Visit: Payer: Self-pay | Admitting: Obstetrics and Gynecology

## 2007-01-16 ENCOUNTER — Encounter: Payer: Self-pay | Admitting: Family Medicine

## 2007-01-16 ENCOUNTER — Ambulatory Visit: Payer: Self-pay | Admitting: Family Medicine

## 2007-01-16 ENCOUNTER — Telehealth: Payer: Self-pay | Admitting: *Deleted

## 2007-01-16 LAB — CONVERTED CEMR LAB: Glucose, Urine, Semiquant: NEGATIVE

## 2007-01-19 ENCOUNTER — Ambulatory Visit: Payer: Self-pay | Admitting: Family Medicine

## 2007-01-19 ENCOUNTER — Ambulatory Visit: Payer: Self-pay | Admitting: Psychology

## 2007-01-20 ENCOUNTER — Ambulatory Visit: Payer: Self-pay | Admitting: Obstetrics and Gynecology

## 2007-01-23 ENCOUNTER — Telehealth (INDEPENDENT_AMBULATORY_CARE_PROVIDER_SITE_OTHER): Payer: Self-pay | Admitting: *Deleted

## 2007-01-24 ENCOUNTER — Encounter (INDEPENDENT_AMBULATORY_CARE_PROVIDER_SITE_OTHER): Payer: Self-pay | Admitting: *Deleted

## 2007-01-26 ENCOUNTER — Telehealth (INDEPENDENT_AMBULATORY_CARE_PROVIDER_SITE_OTHER): Payer: Self-pay | Admitting: *Deleted

## 2007-01-26 ENCOUNTER — Encounter (INDEPENDENT_AMBULATORY_CARE_PROVIDER_SITE_OTHER): Payer: Self-pay | Admitting: Family Medicine

## 2007-01-26 ENCOUNTER — Ambulatory Visit: Payer: Self-pay | Admitting: Obstetrics & Gynecology

## 2007-01-26 ENCOUNTER — Encounter: Payer: Self-pay | Admitting: Family Medicine

## 2007-01-26 ENCOUNTER — Ambulatory Visit: Payer: Self-pay | Admitting: Family Medicine

## 2007-01-26 ENCOUNTER — Inpatient Hospital Stay (HOSPITAL_COMMUNITY): Admission: AD | Admit: 2007-01-26 | Discharge: 2007-01-28 | Payer: Self-pay | Admitting: Obstetrics & Gynecology

## 2007-01-26 LAB — CONVERTED CEMR LAB
Bilirubin Urine: NEGATIVE
Blood in Urine, dipstick: NEGATIVE
Chlamydia, DNA Probe: NEGATIVE
GC Probe Amp, Genital: NEGATIVE
Glucose, Urine, Semiquant: NEGATIVE
Nitrite: NEGATIVE
Protein, U semiquant: 30
Specific Gravity, Urine: 1.02
Urobilinogen, UA: 2
Whiff Test: NEGATIVE
pH: 7

## 2007-01-30 ENCOUNTER — Ambulatory Visit: Payer: Self-pay | Admitting: Family Medicine

## 2007-02-02 ENCOUNTER — Ambulatory Visit: Payer: Self-pay | Admitting: Obstetrics & Gynecology

## 2007-02-02 ENCOUNTER — Ambulatory Visit: Payer: Self-pay | Admitting: Family Medicine

## 2007-02-06 ENCOUNTER — Telehealth (INDEPENDENT_AMBULATORY_CARE_PROVIDER_SITE_OTHER): Payer: Self-pay | Admitting: Family Medicine

## 2007-02-07 ENCOUNTER — Ambulatory Visit: Payer: Self-pay | Admitting: Family Medicine

## 2007-02-07 LAB — CONVERTED CEMR LAB: Glucose, Urine, Semiquant: NEGATIVE

## 2007-02-08 ENCOUNTER — Ambulatory Visit: Payer: Self-pay | Admitting: Obstetrics and Gynecology

## 2007-02-14 ENCOUNTER — Telehealth: Payer: Self-pay | Admitting: *Deleted

## 2007-02-16 ENCOUNTER — Telehealth: Payer: Self-pay | Admitting: Psychology

## 2007-02-16 ENCOUNTER — Ambulatory Visit: Payer: Self-pay | Admitting: Obstetrics & Gynecology

## 2007-02-21 ENCOUNTER — Ambulatory Visit: Payer: Self-pay | Admitting: *Deleted

## 2007-02-21 ENCOUNTER — Inpatient Hospital Stay (HOSPITAL_COMMUNITY): Admission: AD | Admit: 2007-02-21 | Discharge: 2007-02-23 | Payer: Self-pay | Admitting: Obstetrics & Gynecology

## 2007-02-24 ENCOUNTER — Ambulatory Visit: Payer: Self-pay | Admitting: *Deleted

## 2007-02-26 ENCOUNTER — Inpatient Hospital Stay (HOSPITAL_COMMUNITY): Admission: AD | Admit: 2007-02-26 | Discharge: 2007-03-01 | Payer: Self-pay | Admitting: Obstetrics & Gynecology

## 2007-02-26 ENCOUNTER — Ambulatory Visit: Payer: Self-pay | Admitting: Obstetrics & Gynecology

## 2007-02-28 ENCOUNTER — Encounter: Payer: Self-pay | Admitting: *Deleted

## 2007-03-02 ENCOUNTER — Encounter: Payer: Self-pay | Admitting: *Deleted

## 2007-03-02 ENCOUNTER — Ambulatory Visit: Payer: Self-pay | Admitting: Obstetrics & Gynecology

## 2007-03-03 ENCOUNTER — Ambulatory Visit: Payer: Self-pay | Admitting: Gynecology

## 2007-03-07 ENCOUNTER — Telehealth: Payer: Self-pay | Admitting: *Deleted

## 2007-03-08 ENCOUNTER — Ambulatory Visit: Payer: Self-pay | Admitting: Obstetrics & Gynecology

## 2007-03-14 ENCOUNTER — Ambulatory Visit: Payer: Self-pay | Admitting: Psychology

## 2007-03-15 ENCOUNTER — Ambulatory Visit: Payer: Self-pay | Admitting: Obstetrics & Gynecology

## 2007-03-21 ENCOUNTER — Encounter: Payer: Self-pay | Admitting: Family Medicine

## 2007-03-23 ENCOUNTER — Encounter: Payer: Self-pay | Admitting: Psychology

## 2007-03-23 ENCOUNTER — Ambulatory Visit: Payer: Self-pay | Admitting: Obstetrics & Gynecology

## 2007-03-27 ENCOUNTER — Observation Stay (HOSPITAL_COMMUNITY): Admission: AD | Admit: 2007-03-27 | Discharge: 2007-03-28 | Payer: Self-pay | Admitting: Obstetrics & Gynecology

## 2007-03-27 ENCOUNTER — Ambulatory Visit: Payer: Self-pay | Admitting: Obstetrics and Gynecology

## 2007-03-30 ENCOUNTER — Ambulatory Visit: Payer: Self-pay | Admitting: Family Medicine

## 2007-04-06 ENCOUNTER — Ambulatory Visit: Payer: Self-pay | Admitting: Family Medicine

## 2007-04-07 ENCOUNTER — Inpatient Hospital Stay (HOSPITAL_COMMUNITY): Admission: AD | Admit: 2007-04-07 | Discharge: 2007-04-09 | Payer: Self-pay | Admitting: Obstetrics & Gynecology

## 2007-04-07 ENCOUNTER — Ambulatory Visit: Payer: Self-pay | Admitting: Obstetrics and Gynecology

## 2007-04-08 HISTORY — PX: TUBAL LIGATION: SHX77

## 2007-04-28 ENCOUNTER — Telehealth (INDEPENDENT_AMBULATORY_CARE_PROVIDER_SITE_OTHER): Payer: Self-pay | Admitting: *Deleted

## 2007-05-01 ENCOUNTER — Encounter: Payer: Self-pay | Admitting: *Deleted

## 2007-05-04 ENCOUNTER — Telehealth: Payer: Self-pay | Admitting: *Deleted

## 2007-05-09 ENCOUNTER — Telehealth: Payer: Self-pay | Admitting: Psychology

## 2007-05-18 ENCOUNTER — Encounter: Payer: Self-pay | Admitting: Psychology

## 2007-05-23 ENCOUNTER — Ambulatory Visit: Payer: Self-pay | Admitting: Family Medicine

## 2007-05-25 ENCOUNTER — Encounter: Payer: Self-pay | Admitting: *Deleted

## 2007-05-26 ENCOUNTER — Telehealth: Payer: Self-pay | Admitting: *Deleted

## 2007-05-29 ENCOUNTER — Ambulatory Visit: Payer: Self-pay | Admitting: Sports Medicine

## 2007-05-30 ENCOUNTER — Encounter (INDEPENDENT_AMBULATORY_CARE_PROVIDER_SITE_OTHER): Payer: Self-pay | Admitting: *Deleted

## 2007-06-08 ENCOUNTER — Encounter: Payer: Self-pay | Admitting: *Deleted

## 2007-06-15 ENCOUNTER — Ambulatory Visit: Payer: Self-pay | Admitting: Family Medicine

## 2007-06-16 ENCOUNTER — Encounter: Payer: Self-pay | Admitting: Family Medicine

## 2007-07-25 ENCOUNTER — Telehealth: Payer: Self-pay | Admitting: *Deleted

## 2007-07-26 ENCOUNTER — Ambulatory Visit: Payer: Self-pay | Admitting: Family Medicine

## 2007-08-03 ENCOUNTER — Encounter: Payer: Self-pay | Admitting: *Deleted

## 2007-08-10 ENCOUNTER — Telehealth: Payer: Self-pay | Admitting: Family Medicine

## 2007-08-14 ENCOUNTER — Telehealth: Payer: Self-pay | Admitting: *Deleted

## 2007-08-16 ENCOUNTER — Telehealth: Payer: Self-pay | Admitting: Family Medicine

## 2007-08-21 ENCOUNTER — Ambulatory Visit: Payer: Self-pay | Admitting: Family Medicine

## 2007-08-21 DIAGNOSIS — F3160 Bipolar disorder, current episode mixed, unspecified: Secondary | ICD-10-CM

## 2007-08-21 HISTORY — DX: Bipolar disorder, current episode mixed, unspecified: F31.60

## 2007-08-21 LAB — CONVERTED CEMR LAB
AST: 15 units/L (ref 0–37)
Albumin: 5.2 g/dL (ref 3.5–5.2)
BUN: 8 mg/dL (ref 6–23)
Calcium: 10.2 mg/dL (ref 8.4–10.5)
Chloride: 106 meq/L (ref 96–112)
Creatinine, Ser: 0.84 mg/dL (ref 0.40–1.20)
Glucose, Bld: 80 mg/dL (ref 70–99)
HCT: 45.9 % (ref 36.0–46.0)
Hemoglobin: 15.3 g/dL — ABNORMAL HIGH (ref 12.0–15.0)
Potassium: 4.5 meq/L (ref 3.5–5.3)
RBC: 5.16 M/uL — ABNORMAL HIGH (ref 3.87–5.11)
RDW: 13.8 % (ref 11.5–15.5)

## 2007-08-24 ENCOUNTER — Telehealth (INDEPENDENT_AMBULATORY_CARE_PROVIDER_SITE_OTHER): Payer: Self-pay | Admitting: *Deleted

## 2007-08-24 ENCOUNTER — Ambulatory Visit: Payer: Self-pay | Admitting: Family Medicine

## 2007-08-24 ENCOUNTER — Encounter: Payer: Self-pay | Admitting: Family Medicine

## 2007-08-24 LAB — CONVERTED CEMR LAB
Beta hcg, urine, semiquantitative: NEGATIVE
Bilirubin Urine: NEGATIVE
Blood in Urine, dipstick: NEGATIVE
Epithelial cells, urine: >20 /lpf
GC Probe Amp, Genital: NEGATIVE
Glucose, Urine, Semiquant: NEGATIVE
Ketones, urine, test strip: NEGATIVE
Nitrite: NEGATIVE
Specific Gravity, Urine: 1.02
Whiff Test: NEGATIVE
pH: 7

## 2007-08-25 ENCOUNTER — Encounter: Payer: Self-pay | Admitting: Family Medicine

## 2007-08-28 ENCOUNTER — Telehealth: Payer: Self-pay | Admitting: Family Medicine

## 2007-09-07 ENCOUNTER — Telehealth: Payer: Self-pay | Admitting: Psychology

## 2007-09-13 ENCOUNTER — Telehealth: Payer: Self-pay | Admitting: Family Medicine

## 2007-09-18 ENCOUNTER — Ambulatory Visit: Payer: Self-pay | Admitting: Psychology

## 2007-09-21 ENCOUNTER — Ambulatory Visit: Payer: Self-pay | Admitting: Family Medicine

## 2007-09-21 DIAGNOSIS — F911 Conduct disorder, childhood-onset type: Secondary | ICD-10-CM | POA: Insufficient documentation

## 2007-09-22 ENCOUNTER — Ambulatory Visit: Payer: Self-pay | Admitting: Family Medicine

## 2007-09-28 ENCOUNTER — Ambulatory Visit: Payer: Self-pay | Admitting: Family Medicine

## 2007-09-28 LAB — CONVERTED CEMR LAB
ALT: 12 units/L (ref 0–35)
CO2: 22 meq/L (ref 19–32)
Calcium: 9.6 mg/dL (ref 8.4–10.5)
Chloride: 105 meq/L (ref 96–112)
Creatinine, Ser: 0.84 mg/dL (ref 0.40–1.20)
Glucose, Bld: 77 mg/dL (ref 70–99)
HCT: 41.5 % (ref 36.0–46.0)
MCV: 87.4 fL (ref 78.0–100.0)
Platelets: 193 10*3/uL (ref 150–400)
RBC: 4.75 M/uL (ref 3.87–5.11)
Total Bilirubin: 0.4 mg/dL (ref 0.3–1.2)
Total Protein: 7.1 g/dL (ref 6.0–8.3)
WBC: 5.4 10*3/uL (ref 4.0–10.5)

## 2007-10-02 ENCOUNTER — Ambulatory Visit: Payer: Self-pay | Admitting: Psychology

## 2007-10-04 ENCOUNTER — Encounter: Payer: Self-pay | Admitting: Family Medicine

## 2007-10-05 ENCOUNTER — Ambulatory Visit: Payer: Self-pay | Admitting: Family Medicine

## 2007-10-05 LAB — CONVERTED CEMR LAB
Beta hcg, urine, semiquantitative: NEGATIVE
Chlamydia, DNA Probe: NEGATIVE
Prolactin: 8.8 ng/mL
Prothrombin Time: 14.2 s (ref 11.6–15.2)

## 2007-10-09 ENCOUNTER — Telehealth: Payer: Self-pay | Admitting: Family Medicine

## 2007-10-09 ENCOUNTER — Ambulatory Visit: Payer: Self-pay | Admitting: Family Medicine

## 2007-10-09 ENCOUNTER — Encounter: Payer: Self-pay | Admitting: Family Medicine

## 2007-10-12 ENCOUNTER — Encounter: Payer: Self-pay | Admitting: Family Medicine

## 2007-10-16 ENCOUNTER — Ambulatory Visit: Payer: Self-pay | Admitting: Psychology

## 2007-10-20 ENCOUNTER — Telehealth: Payer: Self-pay | Admitting: *Deleted

## 2007-10-20 ENCOUNTER — Ambulatory Visit: Payer: Self-pay | Admitting: Family Medicine

## 2007-10-20 DIAGNOSIS — J069 Acute upper respiratory infection, unspecified: Secondary | ICD-10-CM | POA: Insufficient documentation

## 2007-10-20 LAB — CONVERTED CEMR LAB: Rapid Strep: NEGATIVE

## 2007-10-26 ENCOUNTER — Ambulatory Visit: Payer: Self-pay | Admitting: Family Medicine

## 2007-10-27 ENCOUNTER — Telehealth: Payer: Self-pay | Admitting: Psychology

## 2007-11-02 ENCOUNTER — Encounter: Payer: Self-pay | Admitting: Family Medicine

## 2007-11-02 DIAGNOSIS — K219 Gastro-esophageal reflux disease without esophagitis: Secondary | ICD-10-CM

## 2007-11-02 HISTORY — DX: Gastro-esophageal reflux disease without esophagitis: K21.9

## 2007-11-09 ENCOUNTER — Telehealth: Payer: Self-pay | Admitting: Psychology

## 2007-11-09 ENCOUNTER — Telehealth: Payer: Self-pay | Admitting: *Deleted

## 2007-11-16 ENCOUNTER — Telehealth: Payer: Self-pay | Admitting: Psychology

## 2007-11-16 ENCOUNTER — Emergency Department (HOSPITAL_COMMUNITY): Admission: EM | Admit: 2007-11-16 | Discharge: 2007-11-17 | Payer: Self-pay | Admitting: Emergency Medicine

## 2007-11-16 ENCOUNTER — Telehealth: Payer: Self-pay | Admitting: *Deleted

## 2007-11-16 LAB — CONVERTED CEMR LAB
Beta hcg, urine, semiquantitative: NEGATIVE
CO2: 29 meq/L
Carbamazepine Lvl: 2.7 ug/mL
Chloride: 107 meq/L
Hemoglobin: 14.5 g/dL
Lymphs Abs: 3.9 10*3/uL
Marijuana Metabolite: POSITIVE
Potassium: 4 meq/L
RBC: 4.78 M/uL
WBC: 6.5 10*3/uL

## 2007-11-30 ENCOUNTER — Ambulatory Visit: Payer: Self-pay | Admitting: Family Medicine

## 2007-11-30 LAB — CONVERTED CEMR LAB
ALT: 15 units/L (ref 0–35)
AST: 14 units/L (ref 0–37)
Albumin: 4.7 g/dL (ref 3.5–5.2)
Total Protein: 7.2 g/dL (ref 6.0–8.3)

## 2007-12-06 ENCOUNTER — Telehealth: Payer: Self-pay | Admitting: Psychology

## 2007-12-21 IMAGING — US US OB LIMITED
1 series · 14 of 28 positions shown · non-contrast
Comparison: none

OBSTETRICAL ULTRASOUND:
 This ultrasound was performed in The [HOSPITAL], and the [facility] report will be stored to [REDACTED] PACS.

[Series 1: us ob limited · 14 of 30 slices shown]
[im 2/30]
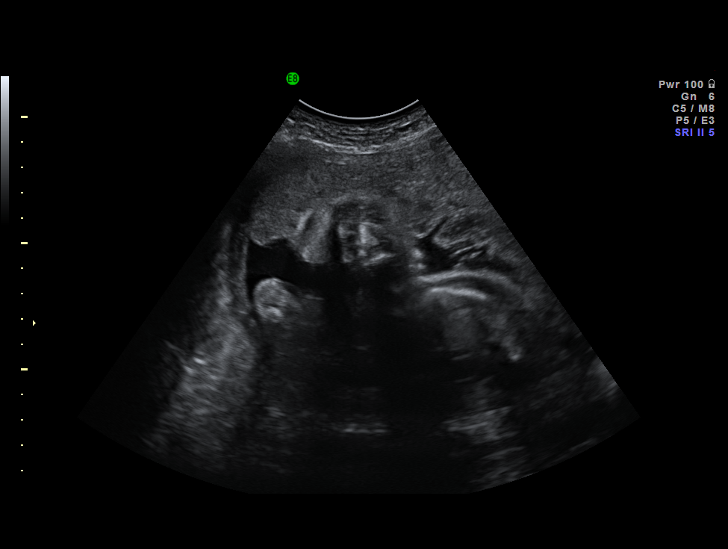
[im 4/30]
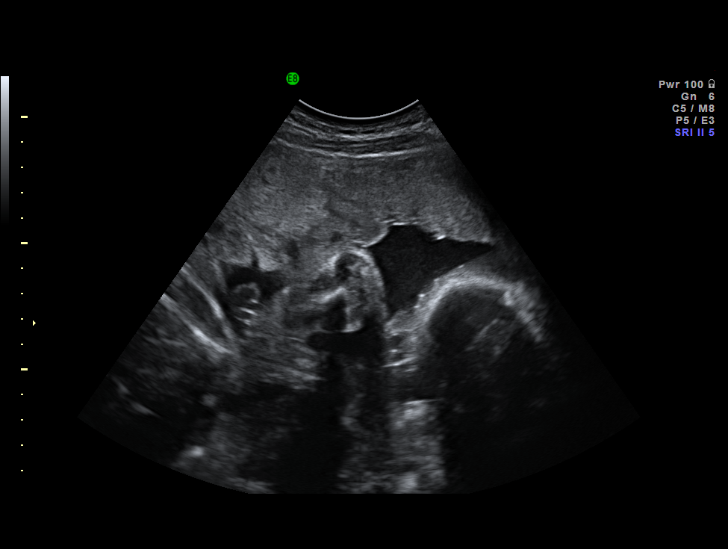
[im 6/30]
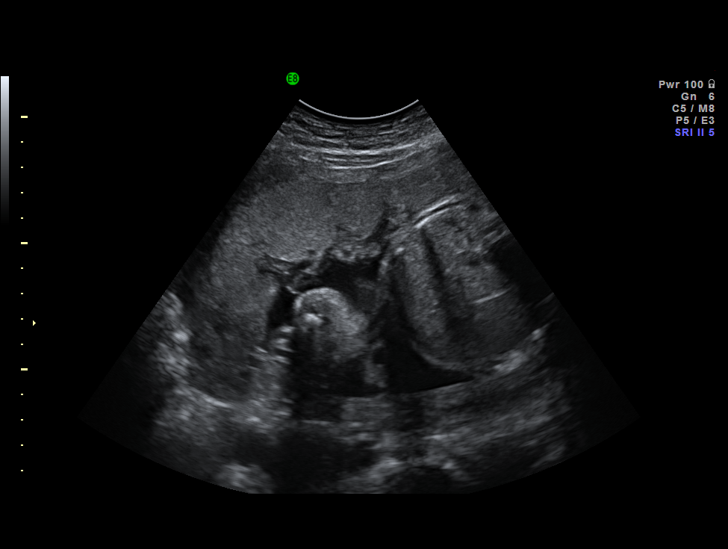
[im 8/30]
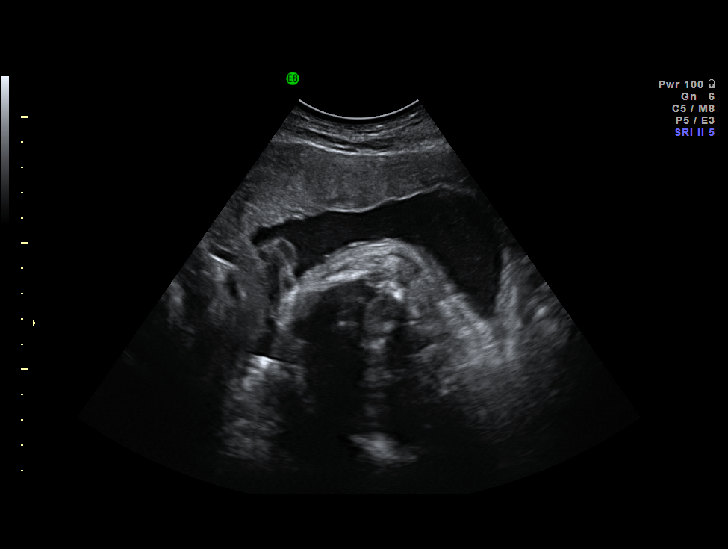
[im 10/30]
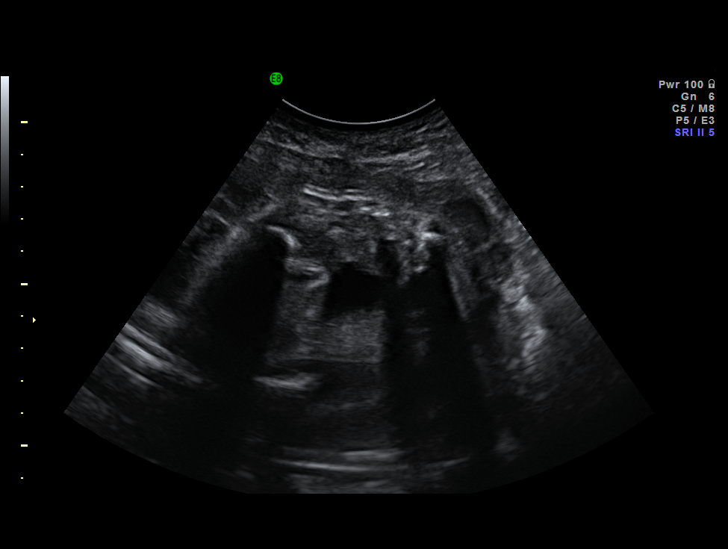
[im 12/30]
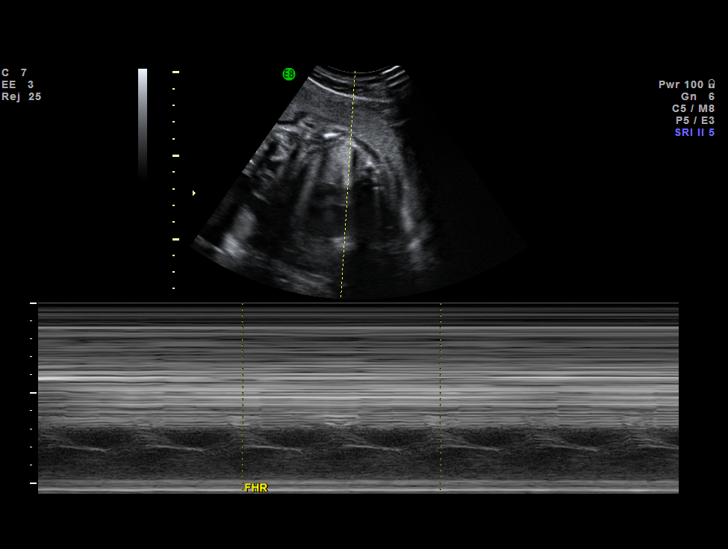
[im 14/30]
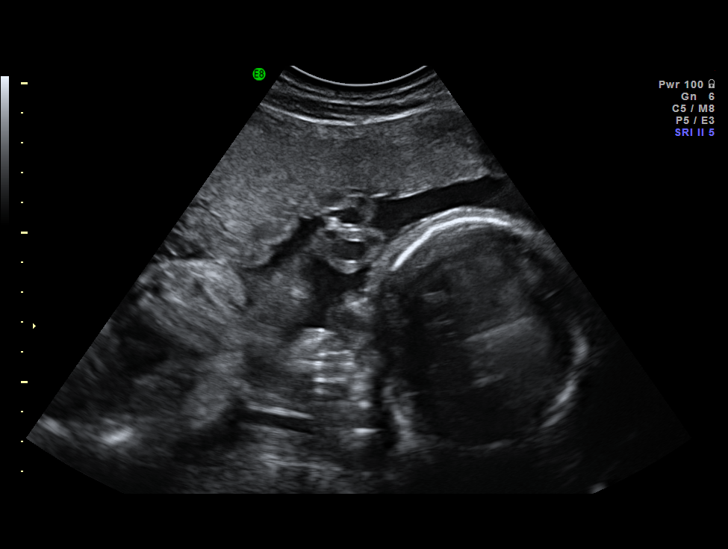
[im 17/30]
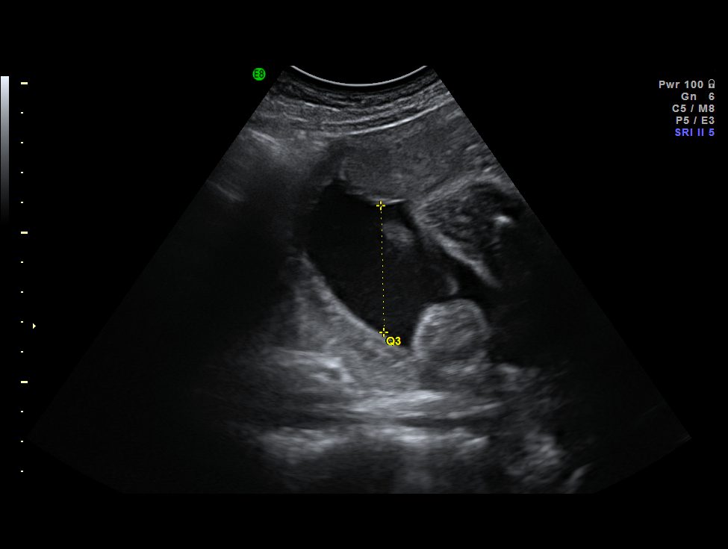
[im 19/30]
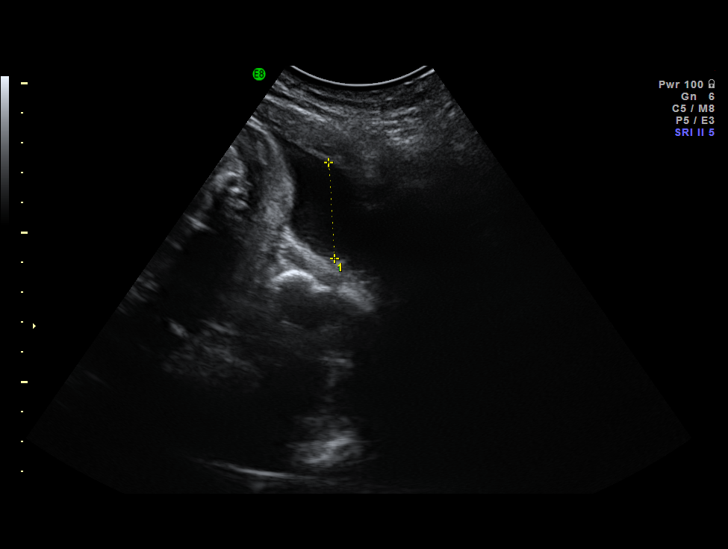
[im 21/30]
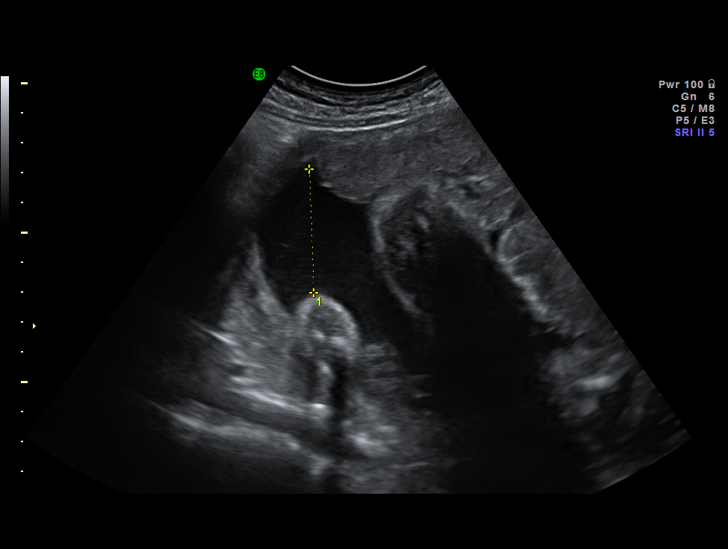
[im 23/30]
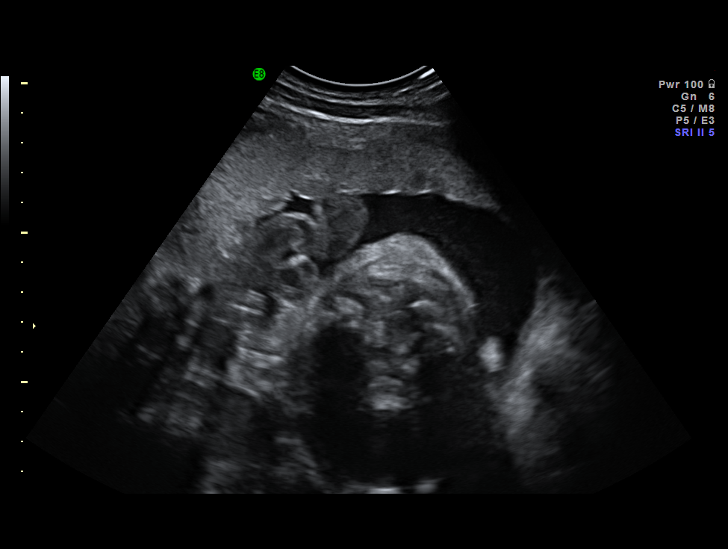
[im 25/30]
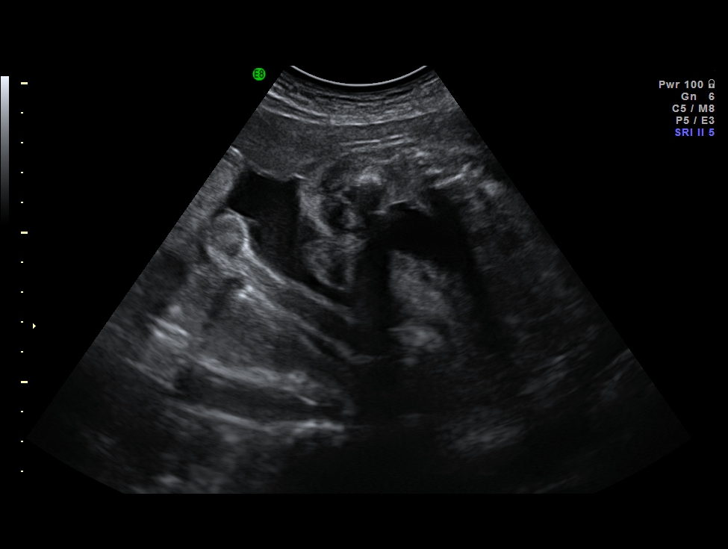
[im 27/30]
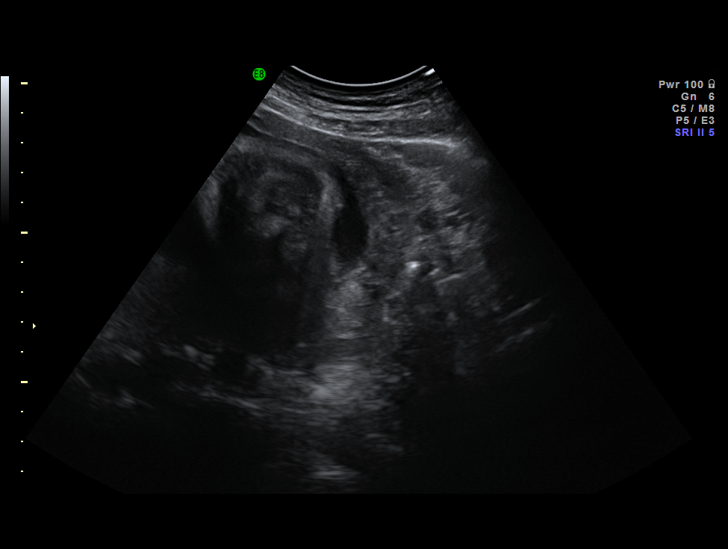
[im 30/30]
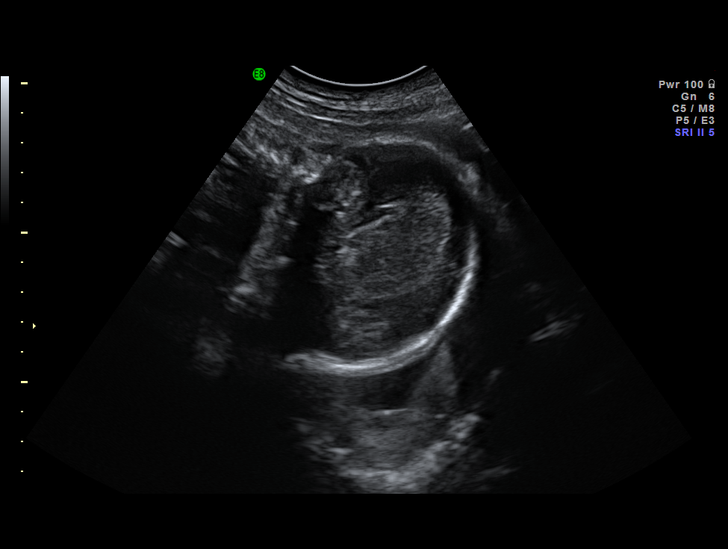

[14 of 28 positions shown; findings below may reference images not displayed]

IMPRESSION: The [facility] report has also been faxed to the ordering physician.

## 2007-12-28 ENCOUNTER — Encounter: Payer: Self-pay | Admitting: Psychology

## 2008-01-02 ENCOUNTER — Ambulatory Visit: Payer: Self-pay | Admitting: Family Medicine

## 2008-01-10 ENCOUNTER — Ambulatory Visit (HOSPITAL_COMMUNITY): Admission: RE | Admit: 2008-01-10 | Discharge: 2008-01-10 | Payer: Self-pay | Admitting: Family Medicine

## 2008-01-10 ENCOUNTER — Telehealth (INDEPENDENT_AMBULATORY_CARE_PROVIDER_SITE_OTHER): Payer: Self-pay | Admitting: *Deleted

## 2008-01-10 ENCOUNTER — Ambulatory Visit: Payer: Self-pay | Admitting: Family Medicine

## 2008-02-12 ENCOUNTER — Telehealth: Payer: Self-pay | Admitting: Family Medicine

## 2008-04-01 ENCOUNTER — Ambulatory Visit: Payer: Self-pay | Admitting: Family Medicine

## 2008-04-03 ENCOUNTER — Telehealth: Payer: Self-pay | Admitting: *Deleted

## 2008-04-04 ENCOUNTER — Encounter: Payer: Self-pay | Admitting: Family Medicine

## 2008-04-26 ENCOUNTER — Telehealth: Payer: Self-pay | Admitting: Family Medicine

## 2008-05-07 ENCOUNTER — Ambulatory Visit (HOSPITAL_COMMUNITY): Admission: RE | Admit: 2008-05-07 | Discharge: 2008-05-07 | Payer: Self-pay | Admitting: Family Medicine

## 2008-05-08 ENCOUNTER — Encounter: Payer: Self-pay | Admitting: Family Medicine

## 2008-05-13 ENCOUNTER — Ambulatory Visit: Payer: Self-pay | Admitting: Family Medicine

## 2008-05-13 DIAGNOSIS — N920 Excessive and frequent menstruation with regular cycle: Secondary | ICD-10-CM

## 2008-05-13 HISTORY — DX: Excessive and frequent menstruation with regular cycle: N92.0

## 2008-05-13 LAB — CONVERTED CEMR LAB: Chlamydia, DNA Probe: NEGATIVE

## 2008-05-15 ENCOUNTER — Telehealth: Payer: Self-pay | Admitting: Family Medicine

## 2008-05-15 DIAGNOSIS — N870 Mild cervical dysplasia: Secondary | ICD-10-CM

## 2008-05-15 DIAGNOSIS — N760 Acute vaginitis: Secondary | ICD-10-CM | POA: Insufficient documentation

## 2008-05-15 DIAGNOSIS — Z87410 Personal history of cervical dysplasia: Secondary | ICD-10-CM | POA: Insufficient documentation

## 2008-05-15 HISTORY — DX: Mild cervical dysplasia: N87.0

## 2008-05-15 HISTORY — DX: Personal history of cervical dysplasia: Z87.410

## 2008-06-12 ENCOUNTER — Telehealth: Payer: Self-pay | Admitting: Psychology

## 2008-06-21 ENCOUNTER — Ambulatory Visit: Payer: Self-pay | Admitting: Family Medicine

## 2008-06-27 ENCOUNTER — Encounter: Payer: Self-pay | Admitting: Psychology

## 2008-06-27 ENCOUNTER — Encounter: Payer: Self-pay | Admitting: Family Medicine

## 2008-06-27 ENCOUNTER — Telehealth: Payer: Self-pay | Admitting: Psychology

## 2008-07-15 ENCOUNTER — Telehealth: Payer: Self-pay | Admitting: Family Medicine

## 2008-07-18 ENCOUNTER — Ambulatory Visit: Payer: Self-pay | Admitting: Family Medicine

## 2008-07-18 DIAGNOSIS — F1010 Alcohol abuse, uncomplicated: Secondary | ICD-10-CM

## 2008-07-18 DIAGNOSIS — G4700 Insomnia, unspecified: Secondary | ICD-10-CM | POA: Insufficient documentation

## 2008-07-18 DIAGNOSIS — F1090 Alcohol use, unspecified, uncomplicated: Secondary | ICD-10-CM | POA: Insufficient documentation

## 2008-07-18 HISTORY — DX: Alcohol abuse, uncomplicated: F10.10

## 2008-07-29 ENCOUNTER — Encounter: Payer: Self-pay | Admitting: Family Medicine

## 2008-09-09 ENCOUNTER — Encounter: Payer: Self-pay | Admitting: Family Medicine

## 2008-10-14 ENCOUNTER — Telehealth: Payer: Self-pay | Admitting: Family Medicine

## 2008-10-17 ENCOUNTER — Ambulatory Visit: Payer: Self-pay | Admitting: Family Medicine

## 2008-10-18 LAB — CONVERTED CEMR LAB
Cholesterol: 147 mg/dL (ref 0–200)
HDL: 57 mg/dL (ref >39)
Total CHOL/HDL Ratio: 2.6
Triglycerides: 74 mg/dL (ref <150)
VLDL: 15 mg/dL (ref 0–40)

## 2008-11-01 IMAGING — CR DG FOOT COMPLETE 3+V*L*
3 series · 3 of 3 positions shown · non-contrast
Comparison: Left foot radiographs 07/25/2003.

CLINICAL DATA: Foot injury.  Question fracture of the fourth toe.

LEFT FOOT - COMPLETE 3+ VIEW

[t foot ap left]
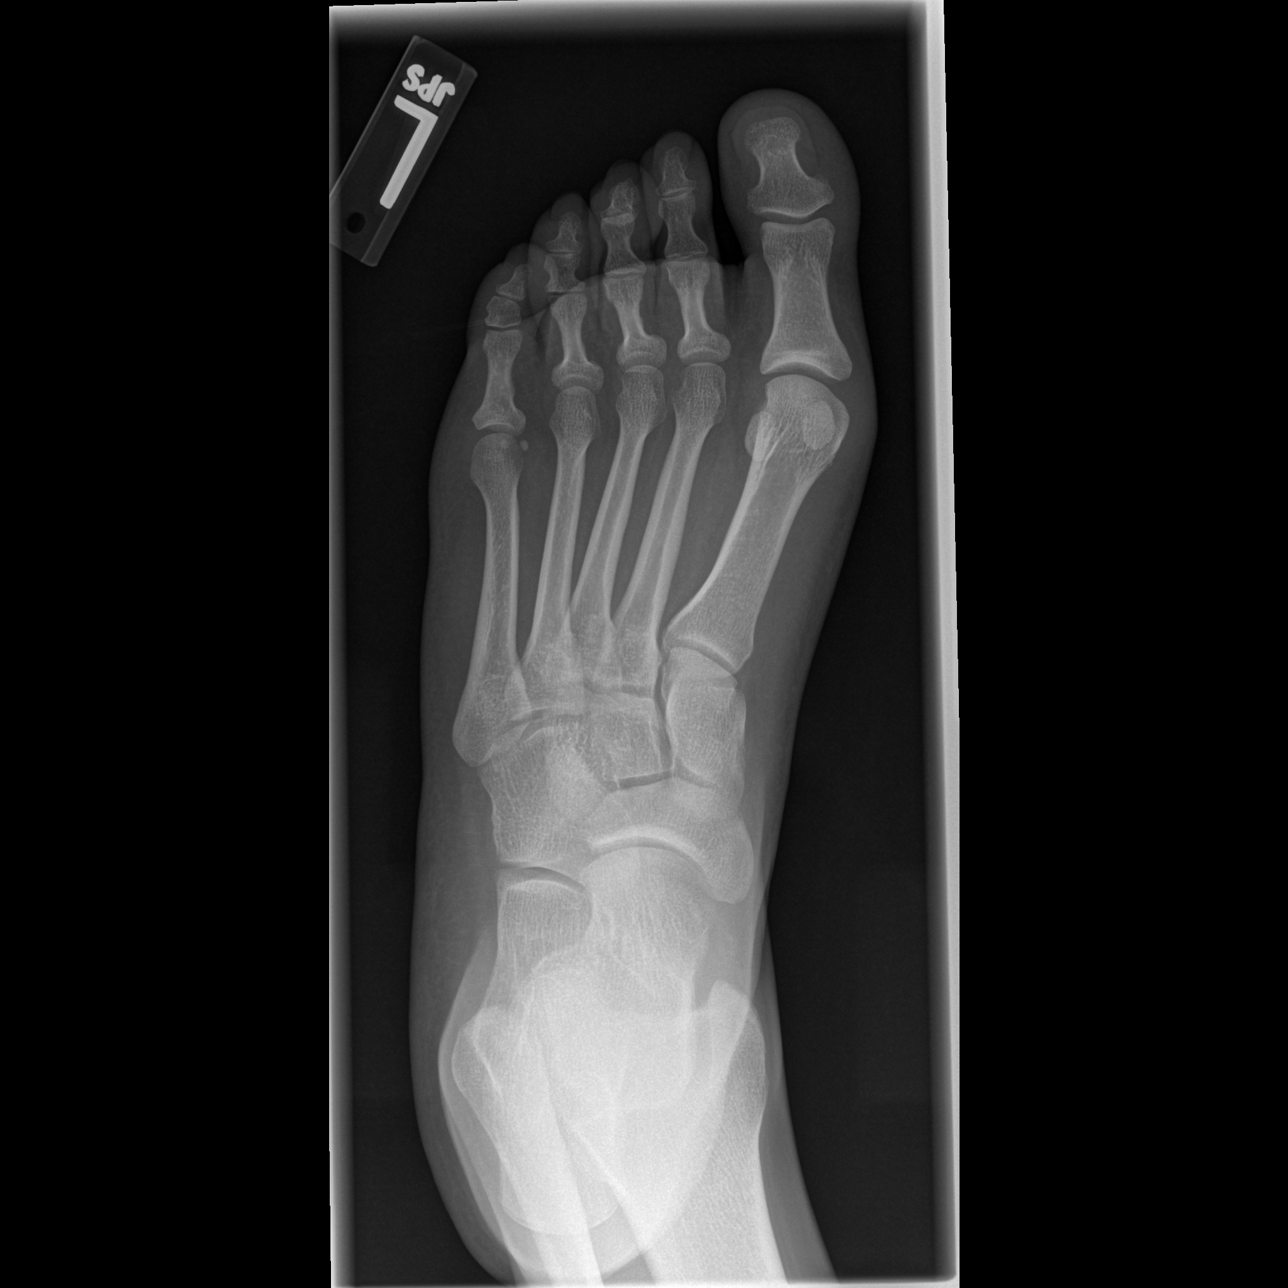

[t foot oblique left]
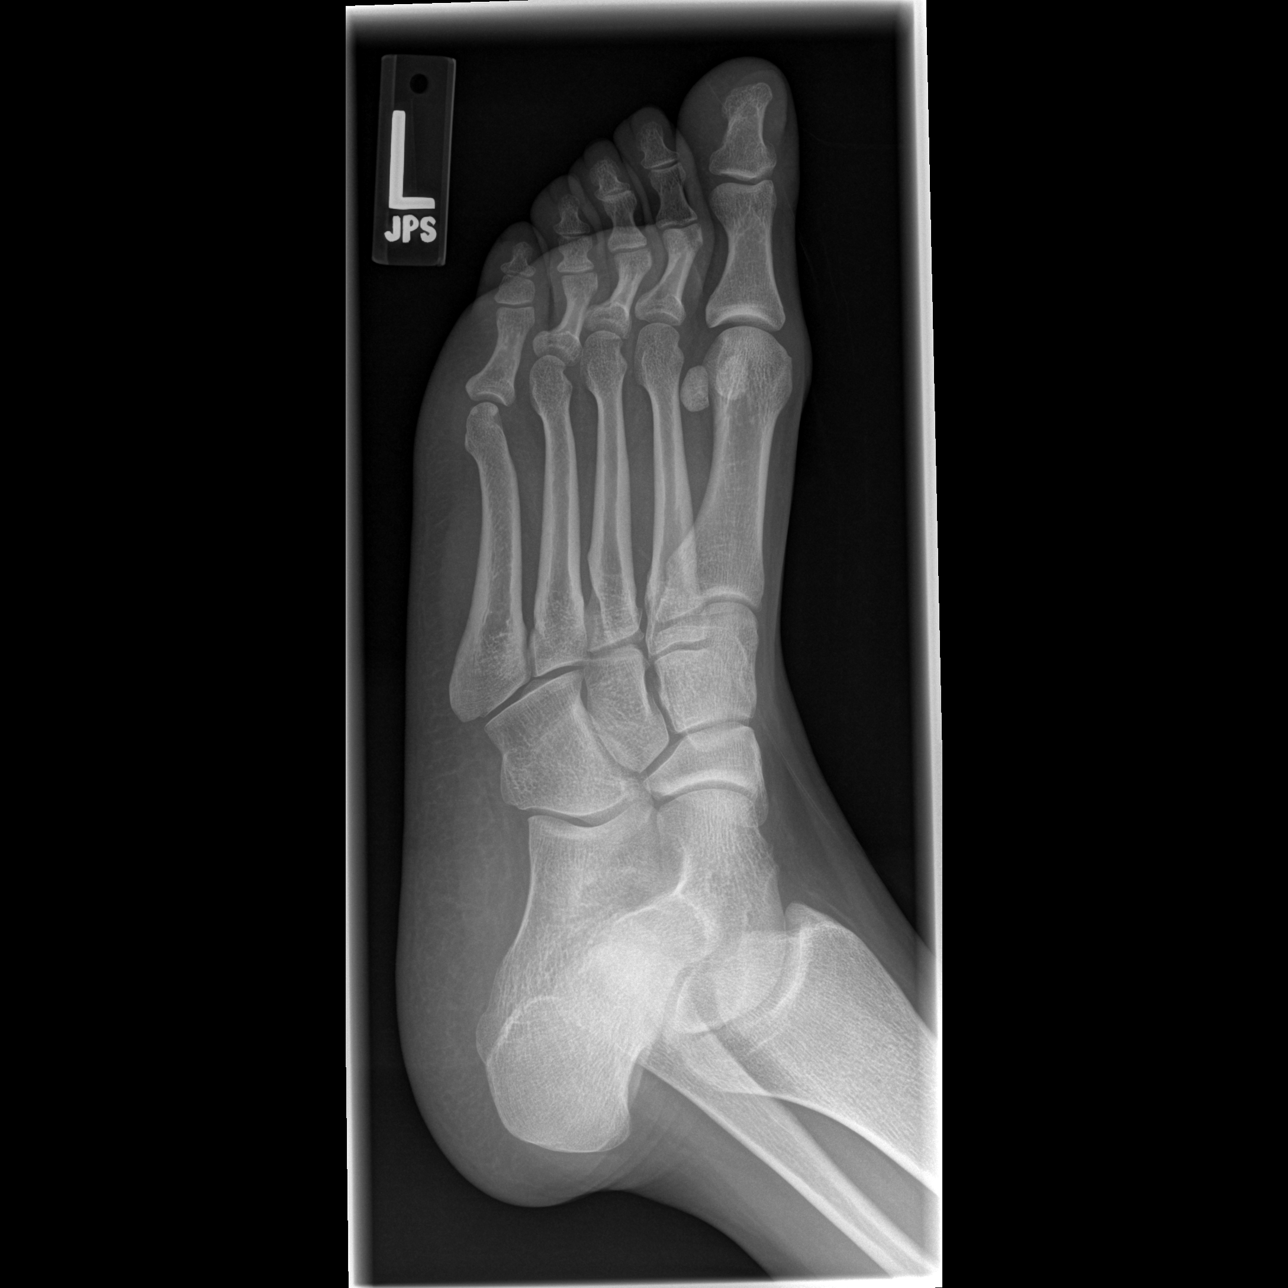

[t foot lat left]
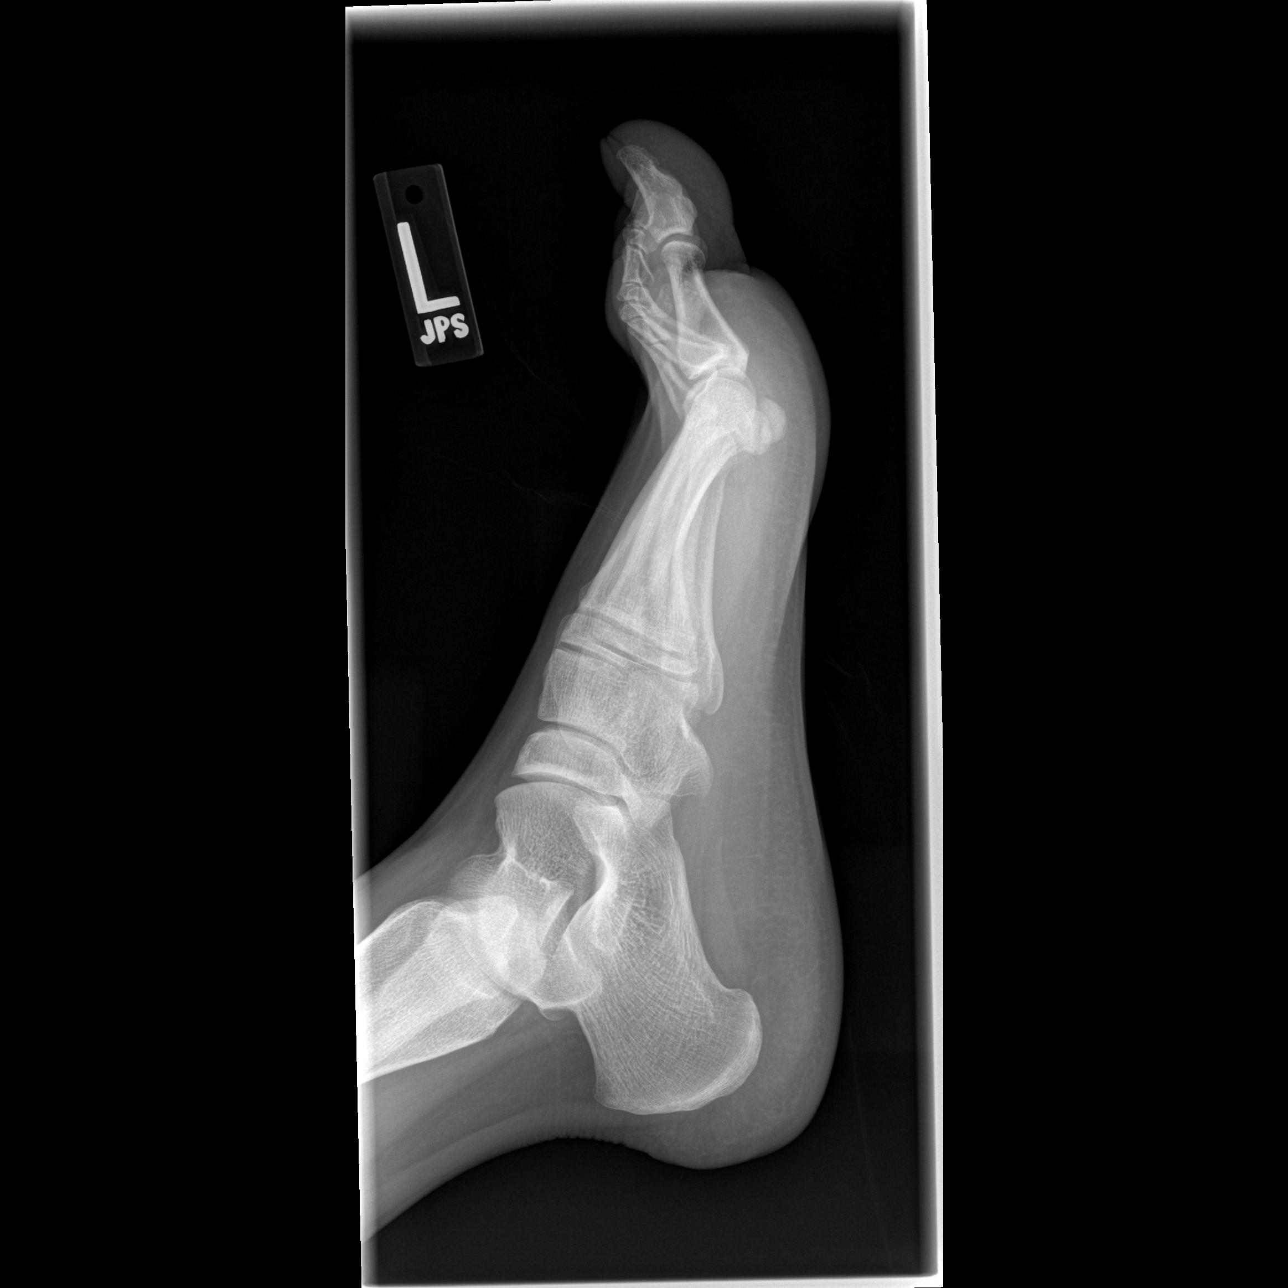

[3 of 3 positions shown; findings below may reference images not displayed]

FINDINGS: Mineralization and alignment appear stable.  There is no
evidence of acute fracture, dislocation or foreign body.
IMPRESSION: Stable examination.  No acute osseous findings.

## 2008-11-06 ENCOUNTER — Telehealth: Payer: Self-pay | Admitting: Psychology

## 2008-11-11 ENCOUNTER — Encounter: Payer: Self-pay | Admitting: Family Medicine

## 2008-11-13 ENCOUNTER — Encounter: Payer: Self-pay | Admitting: Family Medicine

## 2008-11-15 ENCOUNTER — Encounter: Payer: Self-pay | Admitting: Family Medicine

## 2008-11-28 ENCOUNTER — Ambulatory Visit: Payer: Self-pay | Admitting: Family Medicine

## 2008-12-16 ENCOUNTER — Encounter: Payer: Self-pay | Admitting: Family Medicine

## 2008-12-19 ENCOUNTER — Ambulatory Visit: Payer: Self-pay | Admitting: Family Medicine

## 2009-03-20 ENCOUNTER — Ambulatory Visit: Payer: Self-pay | Admitting: Family Medicine

## 2009-04-07 ENCOUNTER — Telehealth: Payer: Self-pay | Admitting: Family Medicine

## 2009-04-23 ENCOUNTER — Telehealth: Payer: Self-pay | Admitting: Family Medicine

## 2009-06-26 ENCOUNTER — Encounter: Payer: Self-pay | Admitting: *Deleted

## 2009-06-26 ENCOUNTER — Encounter: Payer: Self-pay | Admitting: Family Medicine

## 2009-06-26 ENCOUNTER — Ambulatory Visit: Payer: Self-pay | Admitting: Family Medicine

## 2009-06-26 LAB — CONVERTED CEMR LAB: Beta hcg, urine, semiquantitative: NEGATIVE

## 2009-06-30 ENCOUNTER — Encounter: Payer: Self-pay | Admitting: Family Medicine

## 2009-07-14 ENCOUNTER — Ambulatory Visit: Payer: Self-pay | Admitting: Family Medicine

## 2009-07-16 ENCOUNTER — Encounter: Payer: Self-pay | Admitting: Family Medicine

## 2009-07-18 ENCOUNTER — Telehealth: Payer: Self-pay | Admitting: *Deleted

## 2009-07-21 ENCOUNTER — Emergency Department (HOSPITAL_COMMUNITY): Admission: EM | Admit: 2009-07-21 | Discharge: 2009-07-21 | Payer: Self-pay | Admitting: Emergency Medicine

## 2009-07-21 ENCOUNTER — Encounter: Payer: Self-pay | Admitting: Family Medicine

## 2009-08-04 ENCOUNTER — Ambulatory Visit: Payer: Self-pay | Admitting: Family Medicine

## 2009-08-12 ENCOUNTER — Telehealth: Payer: Self-pay | Admitting: Family Medicine

## 2009-08-19 ENCOUNTER — Encounter: Payer: Self-pay | Admitting: Family Medicine

## 2009-08-21 ENCOUNTER — Ambulatory Visit: Payer: Self-pay | Admitting: Family Medicine

## 2009-08-21 DIAGNOSIS — M545 Low back pain, unspecified: Secondary | ICD-10-CM | POA: Insufficient documentation

## 2009-08-22 ENCOUNTER — Telehealth: Payer: Self-pay | Admitting: Family Medicine

## 2009-08-26 ENCOUNTER — Emergency Department (HOSPITAL_COMMUNITY): Admission: EM | Admit: 2009-08-26 | Discharge: 2009-08-26 | Payer: Self-pay | Admitting: Emergency Medicine

## 2009-08-26 ENCOUNTER — Encounter: Payer: Self-pay | Admitting: Family Medicine

## 2009-08-29 ENCOUNTER — Ambulatory Visit: Payer: Self-pay | Admitting: Family Medicine

## 2009-09-11 ENCOUNTER — Encounter: Payer: Self-pay | Admitting: Family Medicine

## 2009-09-11 ENCOUNTER — Ambulatory Visit: Payer: Self-pay | Admitting: Family Medicine

## 2009-09-11 LAB — CONVERTED CEMR LAB
ALT: 16 units/L (ref 0–35)
CO2: 26 meq/L (ref 19–32)
Calcium: 10.4 mg/dL (ref 8.4–10.5)
Chloride: 100 meq/L (ref 96–112)
Creatinine, Ser: 0.87 mg/dL (ref 0.40–1.20)
Total Protein: 7.6 g/dL (ref 6.0–8.3)

## 2009-09-12 ENCOUNTER — Telehealth: Payer: Self-pay | Admitting: *Deleted

## 2009-09-18 ENCOUNTER — Encounter: Payer: Self-pay | Admitting: Family Medicine

## 2009-11-14 ENCOUNTER — Telehealth: Payer: Self-pay | Admitting: *Deleted

## 2009-11-18 ENCOUNTER — Encounter: Payer: Self-pay | Admitting: Family Medicine

## 2009-12-15 ENCOUNTER — Telehealth: Payer: Self-pay | Admitting: Family Medicine

## 2010-01-12 ENCOUNTER — Ambulatory Visit: Payer: Self-pay | Admitting: Family Medicine

## 2010-01-12 DIAGNOSIS — L2089 Other atopic dermatitis: Secondary | ICD-10-CM | POA: Insufficient documentation

## 2010-01-12 LAB — CONVERTED CEMR LAB: Beta hcg, urine, semiquantitative: NEGATIVE

## 2010-02-02 ENCOUNTER — Telehealth: Payer: Self-pay | Admitting: Family Medicine

## 2010-02-13 ENCOUNTER — Encounter (INDEPENDENT_AMBULATORY_CARE_PROVIDER_SITE_OTHER): Payer: Self-pay | Admitting: *Deleted

## 2010-02-16 ENCOUNTER — Ambulatory Visit: Payer: Self-pay | Admitting: Family Medicine

## 2010-02-16 LAB — CONVERTED CEMR LAB
Calcium: 10.1 mg/dL (ref 8.4–10.5)
Chloride: 102 meq/L (ref 96–112)
Creatinine, Ser: 0.81 mg/dL (ref 0.40–1.20)
HDL: 47 mg/dL (ref >39)
Hemoglobin: 14 g/dL (ref 12.0–15.0)
LDL Cholesterol: 119 mg/dL — ABNORMAL HIGH (ref 0–99)
RBC: 4.5 M/uL (ref 3.87–5.11)
Total CHOL/HDL Ratio: 3.8
Triglycerides: 63 mg/dL (ref <150)
VLDL: 13 mg/dL (ref 0–40)

## 2010-02-17 DIAGNOSIS — I10 Essential (primary) hypertension: Secondary | ICD-10-CM | POA: Insufficient documentation

## 2010-02-17 HISTORY — DX: Essential (primary) hypertension: I10

## 2010-02-23 ENCOUNTER — Ambulatory Visit: Payer: Self-pay | Admitting: Family Medicine

## 2010-03-30 ENCOUNTER — Ambulatory Visit: Admit: 2010-03-30 | Payer: Self-pay | Admitting: Family Medicine

## 2010-04-05 ENCOUNTER — Encounter: Payer: Self-pay | Admitting: Family Medicine

## 2010-04-06 ENCOUNTER — Encounter: Payer: Self-pay | Admitting: Family Medicine

## 2010-04-14 NOTE — Miscellaneous (Signed)
Summary: [facility] ED Visit 08/26/09  Clinical Lists Changes  Observations: Added new observation of CT OF HEAD:  Clinical Data: Syncope/hypertension    CT HEAD WITHOUT CONTRAST    Technique:  Contiguous axial images were obtained from the base of   the skull through the vertex without contrast.    Comparison: 11/16/2007    Findings: Ventricular size and CSF spaces normal.     No evidence for acute infarct, hemorrhage, or mass lesion. No   extra-axial fluid collections or midline shift.  Calvarium intact.   No fluid in the sinuses visualized.    IMPRESSION:   No acute or significant findings.    Read By:  Bernerd Limbo,  M.D. (08/26/2009 8:25) Added new observation of CXR RESULTS: Clinical Data: Syncope and hypertension.  History of asthma.   Smoker    CHEST - 2 VIEW    Comparison: 02/26/2007    Findings: Heart and mediastinal contours are within normal limits.   The lung fields appear clear with no evidence for focal infiltrate   or congestive failure.  No pleural fluid or significant   peribronchial cuffing is seen.    Bony structures appear intact.    IMPRESSION:   No acute or focal cardiopulmonary abnormality noted.    Read By:  Bertha Stakes,  M.D. (08/26/2009 8:25)      CXR  Procedure date:  08/26/2009  Findings:      Clinical Data: Syncope and hypertension.  History of asthma.   Smoker    CHEST - 2 VIEW    Comparison: 02/26/2007    Findings: Heart and mediastinal contours are within normal limits.   The lung fields appear clear with no evidence for focal infiltrate   or congestive failure.  No pleural fluid or significant   peribronchial cuffing is seen.    Bony structures appear intact.    IMPRESSION:   No acute or focal cardiopulmonary abnormality noted.    Read By:  Bertha Stakes,  M.D.  CT Brain  Procedure date:  08/26/2009  Findings:       Clinical Data: Syncope/hypertension    CT HEAD WITHOUT CONTRAST    Technique:   Contiguous axial images were obtained from the base of   the skull through the vertex without contrast.    Comparison: 11/16/2007    Findings: Ventricular size and CSF spaces normal.     No evidence for acute infarct, hemorrhage, or mass lesion. No   extra-axial fluid collections or midline shift.  Calvarium intact.   No fluid in the sinuses visualized.    IMPRESSION:   No acute or significant findings.    Read By:  Bernerd Limbo,  M.D.   CXR  Procedure date:  08/26/2009  Findings:      Clinical Data: Syncope and hypertension.  History of asthma.   Smoker    CHEST - 2 VIEW    Comparison: 02/26/2007    Findings: Heart and mediastinal contours are within normal limits.   The lung fields appear clear with no evidence for focal infiltrate   or congestive failure.  No pleural fluid or significant   peribronchial cuffing is seen.    Bony structures appear intact.    IMPRESSION:   No acute or focal cardiopulmonary abnormality noted.    Read By:  Bertha Stakes,  M.D.  CT Brain  Procedure date:  08/26/2009  Findings:       Clinical Data: Syncope/hypertension    CT HEAD  WITHOUT CONTRAST    Technique:  Contiguous axial images were obtained from the base of   the skull through the vertex without contrast.    Comparison: 11/16/2007    Findings: Ventricular size and CSF spaces normal.     No evidence for acute infarct, hemorrhage, or mass lesion. No   extra-axial fluid collections or midline shift.  Calvarium intact.   No fluid in the sinuses visualized.    IMPRESSION:   No acute or significant findings.    Read By:  Bernerd Limbo,  M.D.

## 2010-04-14 NOTE — Progress Notes (Signed)
Summary: phn msg  Phone Note Call from Patient Call back at [facility] Phone [phone]   Caller: Patient Summary of Call: Pt wants Dr. Braxtin Bamba to call her so she can give him the information about the treatment he referred her to.  Would like only to speak with Dr. Aceyn Kathol about this.  Very personal. Initial call taken by: Clydell Hakim,  April 07, 2009 8:55 AM  Follow-up for Phone Call        Pt reports not drinking for last 2 weeks except for one drink of wine yesterday.   Patient has appointment with [facility] for recovery tx february 18.  No withdrawal symptoms.  Shaguana misses the calming effect on her nerves. Encourgaed Donye to attend open AA meetings with her husband.  Follow-up by: Tawanna Cooler Dyneshia Baccam MD,  April 07, 2009 9:44 AM

## 2010-04-14 NOTE — Miscellaneous (Signed)
Summary: RX refill  Prescriptions: [facility] OMEPRAZOLE 20.6 MG CPDR (OMEPRAZOLE MAGNESIUM) One tablet by mouth one hour before bedtime.  #30 x PRN   Entered and Authorized by:   Tawanna Cooler Zavannah Deblois MD   Signed by:   Tawanna Cooler Joud Pettinato MD on 06/26/2009   Method used:   Electronically to        [facility] [phone]* (retail)       [address]       [place], [place]  [address]       Ph: [phone]       Fax: [phone]   RxID:   [id number]  Patient needs a refill of Omeprazole called in to pharmacy.  She uses [facility] [address]. Bradly Bienenstock  June 26, 2009 12:17 PM

## 2010-04-14 NOTE — Miscellaneous (Signed)
Summary: Letter  Patient wants dr to write a letter for her disability.  She would like for it to be faxed  when ready. Bradly Bienenstock  June 26, 2009 2:35 PM  pt is applying for disability & wants pcp to write a letter for her. form to pcp.Golden Circle RN  June 26, 2009 2:55 PM  Pt sts that the number is on the back of the form. ............................................... Shanda Bumps [facility] June 27, 2009 10:58 AM  Letter fax'd today. Akim Watkinson MD  June 27, 2009 11:46 AM

## 2010-04-14 NOTE — Assessment & Plan Note (Signed)
Summary: depo  Nurse Visit    Allergies: No Known Drug Allergies Laboratory Results   Urine Tests  Date/Time Received: June 26, 2009 12:14 PM  Date/Time Reported: June 26, 2009 12:20 PM     Urine HCG: negative Comments: ...........test performed by...........Marland KitchenTerese Door, CMA     Medication Administration  Injection # 1:    Medication: Depo-Provera 150mg     Diagnosis: CONTRACEPTIVE MANAGEMENT (ICD-V25.09)    Route: IM    Site: RUOQ gluteus    Exp Date: 01/2012    Lot #: [id number]    Mfr: [email or web address]    Comments: next depo due June 30 thru September 25, 2009    Patient tolerated injection without complications    Given by: Theresia Lo RN (June 26, 2009 12:28 PM)  Orders Added: 1)  U Preg-[facility] [81025] 2)  Depo-Provera 150mg  [J1055] 3)  Admin of Injection (IM/SQ) [14782]    Medication Administration  Injection # 1:    Medication: Depo-Provera 150mg     Diagnosis: CONTRACEPTIVE MANAGEMENT (ICD-V25.09)    Route: IM    Site: RUOQ gluteus    Exp Date: 01/2012    Lot #: [id number]    Mfr: [email or web address]    Comments: next depo due June 30 thru September 25, 2009    Patient tolerated injection without complications    Given by: Theresia Lo RN (June 26, 2009 12:28 PM)  Orders Added: 1)  U Preg-[facility] [81025] 2)  Depo-Provera 150mg  [J1055] 3)  Admin of Injection (IM/SQ) [08657]  Appended Document: depo patient states she has had sexual activity in past 5 days. however states she has had tubal ligation and gets depo to regulate periods.
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
## 2010-04-14 NOTE — Assessment & Plan Note (Signed)
Summary: LOW POTASSIUM/KH    Complete Medication List: 1)  Depo-provera 150 Mg/ml Im Susp (Medroxyprogesterone acetate) .... 150mg  im injection every three months for contraception 2)  Fluoxetine Hcl 40 Mg Caps (Fluoxetine hcl) .... One capsule daily by mouth 3)  Cvs Omeprazole 20.6 Mg Cpdr (Omeprazole magnesium) .... One tablet by mouth one hour before bedtime. 4)  Zyprexa  .... Per [facility] 5)  Prilosec Otc 20 Mg Tbec (Omeprazole magnesium) .Marland Kitchen.. 1 tablet by mouth twice a day 6)  Naprosyn 500 Mg Tabs (Naproxen) .... One tablet by mouth three times a day for 10 days.  take with food. 7)  Nicotine Polacrilex 2 Mg Lozg (Nicotine polacrilex) .... Dissolve 1 lozenge in mouth q 1-2 hours as needed cravings for 6 weeks, then q 2-4 hours as needed for 2 weeks, then q 4-8 hours prn for 2 weeks. 8)  Proair Hfa 108 (90 Base) Mcg/act Aers (Albuterol sulfate) .... 2 sprays inhaled every 4 hours if needed for shorness of breath or wheezing 9)  Chlorthalidone 25 Mg Tabs (Chlorthalidone) .Marland Kitchen.. 1 tab by mouth qday 10)  Klor-con M20 20 Meq Cr-tabs (Potassium chloride crys cr) .... 2 tabs by mouth q4 hours x1 day, then 2 tabs by mouth daily.  Other Orders: Comp [facility] [phone]) Depo-Provera 150mg  (Z5638)  Patient Instructions: 1)  Please schedule follow up with Dr. McDiarmid for next thursday 7/7 Prescriptions: KLOR-CON M20 20 MEQ CR-TABS (POTASSIUM CHLORIDE CRYS CR) 2 tabs by mouth q4 hours x1 day, then 2 tabs by mouth daily.  #30 x 0   Entered by:   Loralee Pacas CMA   Authorized by:   Lequita Asal  MD   Signed by:   Lequita Asal  MD on 09/11/2009   Method used:   Electronically to        [facility] [phone]* (retail)       [address]       [place], [place]  [address]       Ph: [phone]       Fax: [phone]   RxID:   [id number]   patient seen by anesthesia yesterday for preop. found to have K+ of 2.6 (same on repeat). patient denies symptoms. started on  chlorthalidone earlier this month. will recheck today, start supplementation, and have patient follow-up in 1 week.  Lequita Asal  MD  September 11, 2009 10:07 AM  Medication Administration  Injection # 1:    Medication: Depo-Provera 150mg     Diagnosis: CONTRACEPTIVE MANAGEMENT (ICD-V25.09)    Route: IM    Site: R deltoid    Exp Date: 04/15/2012    Lot #: [id number]    Mfr: [email or web address]    Comments: To return to clinic Sept 9 - Sept 29    Patient tolerated injection without complications    Given by: Jone Baseman CMA (September 11, 2009 9:57 AM)  Orders Added: 1)  Comp [facility] [51761-60737] 2)  Depo-Provera 150mg  [J1055]   Appended Document: LOW POTASSIUM/KH    Clinical Lists Changes  Orders: Added new Test order of [facility]- Est Level  2 (10626) - Signed       Impression & Recommendations:  Problem # 1:  HYPOKALEMIA (ICD-276.8)  Orders: [facility]- Est Level  2 (94854)   Complete Medication List: 1)  Depo-provera 150 Mg/ml Im Susp (Medroxyprogesterone acetate) .... 150mg  im injection every three months for contraception 2)  Fluoxetine Hcl 40 Mg Caps (Fluoxetine hcl) .... One capsule daily by mouth 3)  Cvs Omeprazole 20.6  Mg Cpdr (Omeprazole magnesium) .... One tablet by mouth one hour before bedtime. 4)  Zyprexa  .... Per [facility] 5)  Prilosec Otc 20 Mg Tbec (Omeprazole magnesium) .Marland Kitchen.. 1 tablet by mouth twice a day 6)  Naprosyn 500 Mg Tabs (Naproxen) .... One tablet by mouth three times a day for 10 days.  take with food. 7)  Nicotine Polacrilex 2 Mg Lozg (Nicotine polacrilex) .... Dissolve 1 lozenge in mouth q 1-2 hours as needed cravings for 6 weeks, then q 2-4 hours as needed for 2 weeks, then q 4-8 hours prn for 2 weeks. 8)  Proair Hfa 108 (90 Base) Mcg/act Aers (Albuterol sulfate) .... 2 sprays inhaled every 4 hours if needed for shorness of breath or wheezing 9)  Chlorthalidone 25 Mg Tabs (Chlorthalidone) .Marland Kitchen.. 1 tab by mouth qday 10)  Klor-con M20 20 Meq Cr-tabs  (Potassium chloride crys cr) .... 2 tabs by mouth q4 hours x1 day, then 2 tabs by mouth daily.

## 2010-04-14 NOTE — Letter (Signed)
Summary: Letter of support for attending school   [facility]  [address]   [place], [place] [address]   Phone: [phone]  Fax: [phone]    June 27, 2009   To Whom It May Concern:   I am writing this letter at the request of Shirley [name] (DOB Jan 18, 1981) to describe those psychiatric conditions that may impact her ability to maintain learn and engage successfully with the student role.  I have provided primary care to [name] as her family physician since July 1999.     [name] is diagnosed with  Bipolar Disorder, type II.  Her psychiatric medications are managed by psychiatric providers at [facility]. Per my most recent office visit note, [name] is on mood-stabilizing medications that have been successful in stabilizing her mood.   My impression is that [name]'s psychiatric condition is in a stable state.  She is actively trying to improve herself and the situation for her family. I believe that [name] is in a state where she can engage successfully with a learning environment in a safe and constructive manner.    Sincerely,   Acquanetta Belling, MD Diplomat, [employer] [facility] Assistant Professor of Family Medicine,  [employer].

## 2010-04-14 NOTE — Miscellaneous (Signed)
Summary: rx for nicotine lozenge  Clinical Lists Changes  Medications: Added new medication of NICOTINE POLACRILEX 2 MG LOZG (NICOTINE POLACRILEX) Dissolve 1 lozenge in mouth q 1-2 hours as needed cravings for 6 weeks, then q 2-4 hours as needed for 2 weeks, then q 4-8 hours prn for 2 weeks. - Signed Removed medication of COMMIT 2 MG LOZG (NICOTINE POLACRILEX) 1 lozenger dissolve in mouth every 1-2 hours PRN for 6 wks, thenq2-4 hours PRN for 2 wks, then q  4-8 hours for 2 wks Rx of NICOTINE POLACRILEX 2 MG LOZG (NICOTINE POLACRILEX) Dissolve 1 lozenge in mouth q 1-2 hours as needed cravings for 6 weeks, then q 2-4 hours as needed for 2 weeks, then q 4-8 hours prn for 2 weeks.;  #72 x 0;  Signed;  Entered by: Sarah Swaziland MD;  Authorized by: Sarah Swaziland MD;  Method used: Faxed to [facility] [phone]*, [address], [place], [place]  [address], Ph: [phone], Fax: [phone]   Will change to other nicotine lozenge per preferred drug list. Sarah Swaziland MD  Jul 21, 2009 11:09 AM  Prescriptions: NICOTINE POLACRILEX 2 MG LOZG (NICOTINE POLACRILEX) Dissolve 1 lozenge in mouth q 1-2 hours as needed cravings for 6 weeks, then q 2-4 hours as needed for 2 weeks, then q 4-8 hours prn for 2 weeks.  #72 x 0   Entered and Authorized by:   Sarah Swaziland MD   Signed by:   Sarah Swaziland MD on 07/21/2009   Method used:   Faxed to ...       [facility] [address] [phone]* (retail)       [address]       [place], [place]  [address]       Ph: [phone]       Fax: [phone]   RxID:   [id number]

## 2010-04-14 NOTE — Progress Notes (Signed)
Summary: phn msg  Phone Note Call from Patient Call back at Home Phone [phone]   Caller: Patient Summary of Call: pt needs f/u on her mental health and need permission to double book Initial call taken by: De Nurse,  February 02, 2010 2:41 PM  Follow-up for Phone Call        May double book. Follow-up by: Tawanna Cooler Tony Friscia MD,  February 03, 2010 12:00 PM

## 2010-04-14 NOTE — Assessment & Plan Note (Signed)
Summary: left hip and leg pain/ls   Vital Signs:  Patient profile:   30 year old female Weight:      155.3 pounds BMI:     25.94 Temp:     98.6 degrees F Pulse rate:   75 / minute BP sitting:   119 / 82  (right arm)  Vitals Entered By: Starleen Blue RN (Jul 14, 2009 9:17 AM) CC: hip pain Is Patient Diabetic? No Pain Assessment Patient in pain? yes     Location: l hip Intensity: 7   Primary Care Provider:  Ellwood Steidle MD  CC:  hip pain.  History of Present Illness: Left leg pain Location: right anterior proximal thigh radiation: posterolateral thigh to just above knee Quality: cramping and intermittent sharp Preciptant: Walking and climbing stairs Improved: rest No weakness in leg.  No numbness in leg No back pain.   Habits & Providers  Alcohol-Tobacco-Diet     Alcohol drinks/day: <1     Tobacco Status: current     Cigarette Packs/Day: <0.25  Exercise-Depression-Behavior     Have you felt down or hopeless? no     Have you felt little pleasure in things? no     Depression Counseling: not indicated; screening negative for depression  Current Medications (verified): 1)  Depo-Provera 150 Mg/ml Im Susp (Medroxyprogesterone Acetate) .... 150mg  Im Injection Every Three Months For Contraception 2)  Fluoxetine Hcl 40 Mg Caps (Fluoxetine Hcl) .... One Capsule Daily By Mouth 3)  Cvs Omeprazole 20.6 Mg Cpdr (Omeprazole Magnesium) .... One Tablet By Mouth One Hour Before Bedtime. 4)  Zyprexa .... Per [facility] 5)  Nicotrol 10 Mg Inha (Nicotine) .... Inhale With Frequent Contiuous Puffing  For About 20 Minutes.  Use At Least 6 Cartridges A Day, Maxiumum 16 Catridges A Day. 6)  Prilosec Otc 20 Mg Tbec (Omeprazole Magnesium) .Marland Kitchen.. 1 Tablet By Mouth Twice A Day 7)  Naprosyn 500 Mg Tabs (Naproxen) .... One Tablet By Mouth Three Times A Day For 10 Days.  Take With Food.  Allergies: No Known Drug Allergies  Physical Exam  General:  Well-developed,well-nourished,in  no acute distress; alert,appropriate and cooperative throughout examination Msk:  Tender right proximal thigh distal to inguinal ligament. No palpable inguinal masses. No grtr trochanteric tenderness. Pain in anterior right  proximal thigh with resisted hip flexion and hip adduction. No pain with resisted hip abduction  No pain with passive hip extension.  No leg edema Equal calf circumferences bilaterally  Pulses:  R dorsalis pedis normal and L dorsalis pedis normal.   Neurologic:  (+)1 knee DTR Bilaterally (+)1 ankle DTR bilaterally    Impression & Recommendations:  Problem # 1:  Sx of THIGH PAIN (ICD-729.5) Assessment New  Right proximal anterior thigh pain with intermittent posterolateral thigh pain to knee.  General working diagnosis is nonspecific musculoskeletal thigh pain.  Trial of Naproxen 500mg  three times a day with increase in omeprazole to 20 mg two times a day General groin stretching exercises prescribed and patient education material given about exercises.  Orders: [facility]- Est Level  3 (88416)  Problem # 2:  Screening Cervical Cancer (ICD-V76.2) RTC in 2 months for Cervical cancer pap screening  Problem # 3:  TOBACCO DEPENDENCE (ICD-305.1)  patient smoking 6 to 10 cigarettes a day. Smoking filtered [place] 100s. Not reaching for cigarette first thing upon awakening after sleep. Interested in nicotrol inhalers. Dr Raymondo Band (Pharm D) demonstrated use of nicotrol inhaler. Ms Broman is to return to clinic in two weeks  to monitor success of smoking abstinence and asses tolerance to Nicotrol inhaler. Pt encorgaged to attend [facility] Smoking cessation classes.  Her updated medication list for this problem includes:    Nicotrol 10 Mg Inha (Nicotine) ..... Inhale with frequent contiuous puffing  for about 20 minutes.  use at least 6 cartridges a day, maxiumum 16 catridges a day.  Orders: [facility]- Est Level  3 (99213)  Complete Medication List: 1)  Depo-provera 150 Mg/ml Im  Susp (Medroxyprogesterone acetate) .... 150mg  im injection every three months for contraception 2)  Fluoxetine Hcl 40 Mg Caps (Fluoxetine hcl) .... One capsule daily by mouth 3)  Cvs Omeprazole 20.6 Mg Cpdr (Omeprazole magnesium) .... One tablet by mouth one hour before bedtime. 4)  Zyprexa  .... Per [facility] 5)  Nicotrol 10 Mg Inha (Nicotine) .... Inhale with frequent contiuous puffing  for about 20 minutes.  use at least 6 cartridges a day, maxiumum 16 catridges a day. 6)  Prilosec Otc 20 Mg Tbec (Omeprazole magnesium) .Marland Kitchen.. 1 tablet by mouth twice a day 7)  Naprosyn 500 Mg Tabs (Naproxen) .... One tablet by mouth three times a day for 10 days.  take with food.  Patient Instructions: 1)  Please schedule follow up office vist in 2 to 3 weeks with Dr Demir Titsworth to follow up smoking cessation.  May double book.  2)  Please schedule a follow-up appointment in 2 months.  Pap smear on next office visit.  3)  Consider coming in for Smoking Cessation classes at the [facility].  4)  Take one Naproxen tablet three times a day with food for next ten days.  This is to treat the bursititis in your left leg. 5)  Increase your omeprazole to one tablet twice a day while taking the Naproxen to avoid it upsetting your stomach.  6)    Prescriptions: NAPROSYN 500 MG TABS (NAPROXEN) One tablet by mouth three times a day for 10 days.  Take with food.  #30 x 1   Entered and Authorized by:   Tawanna Cooler Suzane Vanderweide MD   Signed by:   Tawanna Cooler Sargon Scouten MD on 07/14/2009   Method used:   Electronically to        [facility] [phone]* (retail)       [address]       [place], [place]  [address]       Ph: [phone]       Fax: [phone]   RxID:   [id number] PRILOSEC OTC 20 MG TBEC (OMEPRAZOLE MAGNESIUM) 1 tablet by mouth twice a day  #60 x 2   Entered and Authorized by:   Tawanna Cooler Delita Chiquito MD   Signed by:   Tawanna Cooler Ruslan Mccabe MD on 07/14/2009   Method used:   Electronically to        [facility] [phone]* (retail)       [address]       [place], [place]  [address]       Ph: [phone]       Fax: [phone]   RxID:   [id number] NICOTROL 10 MG INHA (NICOTINE) Inhale with frequent contiuous puffing  for about 20 minutes.  Use at least 6 cartridges a day, maxiumum 16 catridges a day.  #42 x 2   Entered and Authorized by:   Tawanna Cooler Harlis Champoux MD   Signed by:   Tawanna Cooler Ernesha Ramone MD on 07/14/2009   Method used:   Electronically to  [facility] [address] [phone]* (retail)       [address]       [place], [place]  [address]       Ph: [phone]       Fax: [phone]   RxID:   [id number]

## 2010-04-14 NOTE — Progress Notes (Signed)
Summary: pls call  Phone Note Call from Patient Call back at Home Phone [phone]   Caller: Patient Summary of Call: wants to talk to [facility] about bleeding issues Initial call taken by: De Nurse,  November 14, 2009 11:15 AM  Follow-up for Phone Call        Called but no answer.  Let VMM to call me back. Follow-up by: Dennison Nancy RN,  November 19, 2009 7:38 PM

## 2010-04-14 NOTE — Miscellaneous (Signed)
Summary: Asthma diagnosis staging  Clinical Lists Changes  Problems: Changed problem from ASTHMA, UNSPECIFIED (ICD-493.90) to ASTHMA, INTERMITTENT (ICD-493.90)
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
## 2010-04-14 NOTE — Assessment & Plan Note (Signed)
Summary: skin prob,df   Vital Signs:  Patient profile:   30 year old female Weight:      158.7 pounds Pulse rate:   92 / minute BP sitting:   130 / 87  (right arm)  Vitals Entered By: Renato Battles slade, cma CC: rash around neck x 5 mos. flu shot. depo provera. BTL 2009. depo was due 11-21-09 through 12-11-09 Is Patient Diabetic? No Pain Assessment Patient in pain? no        Primary Care Provider:  TODD MCDIARMID MD  CC:  rash around neck x 5 mos. flu shot. depo provera. BTL 2009. depo was due 11-21-09 through 12-11-09.  History of Present Illness: Digging and scratching at the back of her neck.  Has had problems with eczema in the past.  Has been using her daughters triamcinalone but not helping.  Itcing is worse at night or when she is hot.  Habits & Providers  Alcohol-Tobacco-Diet     Tobacco Status: current     Tobacco Counseling: to quit use of tobacco products     Cigarette Packs/Day: <0.25  Current Medications (verified): 1)  Depo-Provera 150 Mg/ml Im Susp (Medroxyprogesterone Acetate) .... 150mg  Im Injection Every Three Months For Contraception 2)  Cvs Omeprazole 20.6 Mg Cpdr (Omeprazole Magnesium) .... One Tablet By Mouth One Hour Before Bedtime. 3)  Zyprexa .... Per [facility] 4)  Prilosec Otc 20 Mg Tbec (Omeprazole Magnesium) .Marland Kitchen.. 1 Tablet By Mouth Twice A Day 5)  Proair Hfa 108 (90 Base) Mcg/act Aers (Albuterol Sulfate) .... 2 Sprays Inhaled Every 4 Hours If Needed For Shorness of Breath or Wheezing 6)  Chlorthalidone 25 Mg Tabs (Chlorthalidone) .Marland Kitchen.. 1 Tab By Mouth Qday 7)  Klor-Con M20 20 Meq Cr-Tabs (Potassium Chloride Crys Cr) .... 2 Tabs By Mouth Q4 Hours X1 Day, Then 2 Tabs By Mouth Daily. 8)  Zoloft 100 Mg Tabs (Sertraline Hcl) .... [facility] 9)  Depakote 500 Mg Tbec (Divalproex Sodium) .... [facility] 10)  Trazodone Hcl 150 Mg Tabs (Trazodone Hcl) .... [facility] 11)  Clobetasol Propionate 0.05 % Oint (Clobetasol Propionate) .... Apply To Dry,  Scaley Patches Two Times A Day, 30 Gm 12)  Hydroxyzine Hcl 25 Mg Tabs (Hydroxyzine Hcl) .... One At Bedtime For Severe Itching  Allergies: No Known Drug Allergies  Review of Systems      See HPI General:  Denies fatigue and fever. Derm:  Complains of dryness and itching.  Physical Exam  General:  Well-developed,well-nourished,in no acute distress; alert,appropriate and cooperative throughout examination Skin:  three eczemitized patches on the back of her neck with some break in skin integrity.  Other scars on scalp and neck from previous erruptions.   Impression & Recommendations:  Problem # 1:  DERMATITIS, ATOPIC (ICD-691.8) increase potency of topical steroid.  May use hydroxyzine 25 mg at bedtime to prevent scratching at night. Her updated medication list for this problem includes:    Clobetasol Propionate 0.05 % Oint (Clobetasol propionate) .Marland Kitchen... Apply to dry, scaley patches two times a day, 30 gm  Orders: [facility]- Est Level  3 (54098)  Complete Medication List: 1)  Depo-provera 150 Mg/ml Im Susp (Medroxyprogesterone acetate) .... 150mg  im injection every three months for contraception 2)  Cvs Omeprazole 20.6 Mg Cpdr (Omeprazole magnesium) .... One tablet by mouth one hour before bedtime. 3)  Zyprexa  .... Per [facility] 4)  Prilosec Otc 20 Mg Tbec (Omeprazole magnesium) .Marland Kitchen.. 1 tablet by mouth twice a day 5)  Proair Hfa 108 (  90 Base) Mcg/act Aers (Albuterol sulfate) .... 2 sprays inhaled every 4 hours if needed for shorness of breath or wheezing 6)  Chlorthalidone 25 Mg Tabs (Chlorthalidone) .Marland Kitchen.. 1 tab by mouth qday 7)  Klor-con M20 20 Meq Cr-tabs (Potassium chloride crys cr) .... 2 tabs by mouth q4 hours x1 day, then 2 tabs by mouth daily. 8)  Zoloft 100 Mg Tabs (Sertraline hcl) .... [facility] 9)  Depakote 500 Mg Tbec (Divalproex sodium) .... [facility] 10)  Trazodone Hcl 150 Mg Tabs (Trazodone hcl) .... [facility] 11)  Clobetasol Propionate 0.05 % Oint  (Clobetasol propionate) .... Apply to dry, scaley patches two times a day, 30 gm 12)  Hydroxyzine Hcl 25 Mg Tabs (Hydroxyzine hcl) .... One at bedtime for severe itching  Other Orders: U Preg-[facility] (60454) Influenza Vaccine NON MCR (09811) Depo-Provera 150mg  (B1478)  Patient Instructions: 1)  apply ointment as directed, do not scratch it 2)  Keep fingernails clean and cut 3)  my use hydroxyzine at bedtime to prevent scratchng during you sleep Prescriptions: HYDROXYZINE HCL 25 MG TABS (HYDROXYZINE HCL) one at bedtime for severe itching  #30 x 1   Entered and Authorized by:   Luretha Murphy NP   Signed by:   Luretha Murphy NP on 01/12/2010   Method used:   Electronically to        [facility] [address] [phone]* (retail)       [address]       [place], [place]  [address]       Ph: [phone]       Fax: [phone]   RxID:   [id number] CLOBETASOL PROPIONATE 0.05 % OINT (CLOBETASOL PROPIONATE) apply to dry, scaley patches two times a day, 30 GM  #1 x 6   Entered and Authorized by:   Luretha Murphy NP   Signed by:   Luretha Murphy NP on 01/12/2010   Method used:   Electronically to        [facility] [phone]* (retail)       [address]       [place], [place]  [address]       Ph: [phone]       Fax: [phone]   RxID:   [id number] PRILOSEC OTC 20 MG TBEC (OMEPRAZOLE MAGNESIUM) 1 tablet by mouth twice a day  #60 x 3   Entered and Authorized by:   Luretha Murphy NP   Signed by:   Luretha Murphy NP on 01/12/2010   Method used:   Electronically to        [facility] [phone]* (retail)       [address]       [place], [place]  [address]       Ph: [phone]       Fax: [phone]   RxID:   [id number]    Medication Administration  Injection # 1:    Medication: Depo-Provera 150mg     Diagnosis: Hx of POLYMENORRHEA (ICD-626.2)    Route: IM    Site: LUOQ gluteus    Exp Date: 02/13/2012    Lot #: [id number]    Mfr: [email or web address]    Comments: NEXT DEPO DUE 1-6 THROUGH  1-30, 2012. REMINDER CARD GIVEN TO PT.    Patient tolerated injection without complications    Given by: Arlyss Repress CMA, (January 12, 2010 10:43 AM)  Orders Added: 1)  U Preg-[facility] [81025] 2)  Influenza Vaccine NON MCR [00028] 3)  Depo-Provera 150mg  [J1055] 4)  [facility]- Est Level  3 [99213]   Immunizations Administered:  Influenza Vaccine # 1:    Vaccine Type: Fluvax Non-MCR    Site: left deltoid    Mfr: GlaxoSmithKline    Dose: 0.5 ml    Route: IM    Given by: Arlyss Repress CMA,    Exp. Date: 09/09/2010    Lot #: [id number]    VIS given: 10/07/09 version given January 12, 2010.  Flu Vaccine Consent Questions:    Do you have a history of severe allergic reactions to this vaccine? no    Any prior history of allergic reactions to egg and/or gelatin? no    Do you have a sensitivity to the preservative Thimersol? no    Do you have a past history of Guillan-Barre Syndrome? no    Do you currently have an acute febrile illness? no    Have you ever had a severe reaction to latex? no    Vaccine information given and explained to patient? yes    Are you currently pregnant? no   Immunizations Administered:  Influenza Vaccine # 1:    Vaccine Type: Fluvax Non-MCR    Site: left deltoid    Mfr: GlaxoSmithKline    Dose: 0.5 ml    Route: IM    Given by: Arlyss Repress CMA,    Exp. Date: 09/09/2010    Lot #: [id number]    VIS given: 10/07/09 version given January 12, 2010.  Laboratory Results   Urine Tests  Date/Time Received: January 12, 2010 10:22 AM  Date/Time Reported: January 12, 2010 10:28 AM     Urine HCG: negative Comments: ...........test performed by...........Marland KitchenTerese Door, CMA         Medication Administration  Injection # 1:    Medication: Depo-Provera 150mg     Diagnosis: Hx of POLYMENORRHEA (ICD-626.2)    Route: IM    Site: LUOQ gluteus    Exp Date: 02/13/2012    Lot #: [id number]    Mfr: [email or web address]    Comments: NEXT DEPO DUE 1-6 THROUGH 1-30, 2012.  REMINDER CARD GIVEN TO PT.    Patient tolerated injection without complications    Given by: Arlyss Repress CMA, (January 12, 2010 10:43 AM)  Orders Added: 1)  U Preg-[facility] [81025] 2)  Influenza Vaccine NON MCR [00028] 3)  Depo-Provera 150mg  [J1055] 4)  [facility]- Est Level  3 [73220]

## 2010-04-14 NOTE — Progress Notes (Signed)
Summary: needs letter  Phone Note Call from Patient Call back at Home Phone [phone]   Caller: Patient Summary of Call: trying to get back in school and needs a letter stating that she suffers from mental illness and how it affects at school.  Also that it is more controlled now  Please call when ready  Initial call taken by: De Nurse,  Aug 12, 2009 10:27 AM  Follow-up for Phone Call        Letter is ready for pick up from [employer]. Follow-up by: Knox Royalty,  August 18, 2009 9:19 AM

## 2010-04-14 NOTE — Progress Notes (Signed)
Summary: Rx Req  Phone Note Refill Request Call back at Home Phone [phone] Message from:  Patient  Refills Requested: Medication #1:  PROAIR HFA 108 (90 BASE) MCG/ACT AERS 2 sprays inhaled every 4 hours if needed for shorness of breath or wheezing [facility] [address]  AND ASKING ONE FOR ABBUTOROL.  Initial call taken by: Clydell Hakim,  December 15, 2009 9:58 AM    Prescriptions: PROAIR HFA 108 (90 BASE) MCG/ACT AERS (ALBUTEROL SULFATE) 2 sprays inhaled every 4 hours if needed for shorness of breath or wheezing  #1 x 3   Entered and Authorized by:   Tawanna Cooler McDiarmid MD   Signed by:   Tawanna Cooler McDiarmid MD on 12/15/2009   Method used:   Electronically to        [facility] [phone]* (retail)       [address]       [place], [place]  [address]       Ph: [phone]       Fax: [phone]   RxID:   [id number]

## 2010-04-14 NOTE — Assessment & Plan Note (Signed)
Summary: ER 6/14 f/u - syncope ,tcb   Vital Signs:  Patient profile:   30 year old female Weight:      153.7 pounds Temp:     98.5 degrees F oral Pulse rate:   82 / minute Pulse rhythm:   regular BP sitting:   131 / 88  (left arm) Cuff size:   regular  Vitals Entered By: Loralee Pacas CMA (August 29, 2009 11:12 AM) CC: follow-up visit   Primary Care Provider:  TODD MCDIARMID MD  CC:  follow-up visit.  History of Present Illness: 23F here for follow up of syncopal event x 2 (see prior note for full assessment); seen by Dr. Wallene Huh and discharged from ER on 08/26/09   1) Syncope: Likely secondary to some degree of orthostasis with starting diuretic (chlorthalidone) as well as some degree of dehydration. No red flags on exam while in ER, no new focal neurological signs, no acute findings on head CT, or CXR. No concern for ischemia on EKG.  Recommended that patient half dose of chlorthalidone, however she continued at full dose as her pressure remained elevated to 160's 170's at home. Patient has increased her water intake as instructed.    Denies further syncope, vertigo, chest pain, palpitations, abdominal pain, dyspnea, unilateral weakness or numbness, nausea, vomiting, diarrhea, URI symptoms.  2) Tobacco use: Actively quitting. Down from 1 pack per day to 2-3 cigarettes per day. Smokes when she is under stress.   Habits & Providers  Alcohol-Tobacco-Diet     Tobacco Status: current     Tobacco Counseling: to quit use of tobacco products     Cigarette Packs/Day: <0.25  Current Medications (verified): 1)  Depo-Provera 150 Mg/ml Im Susp (Medroxyprogesterone Acetate) .... 150mg  Im Injection Every Three Months For Contraception 2)  Fluoxetine Hcl 40 Mg Caps (Fluoxetine Hcl) .... One Capsule Daily By Mouth 3)  Cvs Omeprazole 20.6 Mg Cpdr (Omeprazole Magnesium) .... One Tablet By Mouth One Hour Before Bedtime. 4)  Zyprexa .... Per [facility] 5)  Prilosec Otc 20 Mg Tbec (Omeprazole  Magnesium) .Marland Kitchen.. 1 Tablet By Mouth Twice A Day 6)  Naprosyn 500 Mg Tabs (Naproxen) .... One Tablet By Mouth Three Times A Day For 10 Days.  Take With Food. 7)  Nicotine Polacrilex 2 Mg Lozg (Nicotine Polacrilex) .... Dissolve 1 Lozenge in Mouth Q 1-2 Hours As Needed Cravings For 6 Weeks, Then Q 2-4 Hours As Needed For 2 Weeks, Then Q 4-8 Hours Prn For 2 Weeks. 8)  Proair Hfa 108 (90 Base) Mcg/act Aers (Albuterol Sulfate) .... 2 Sprays Inhaled Every 4 Hours If Needed For Shorness of Breath or Wheezing 9)  Chlorthalidone 25 Mg Tabs (Chlorthalidone) .Marland Kitchen.. 1 Tab By Mouth Qday  Allergies (verified): No Known Drug Allergies  Review of Systems       see above   Physical Exam  General:  pleasant, NAD, alert  Eyes:  pupils equal, round and reactive to light, strabismus.   Mouth:  moist membranes  Neck:  no JVD  Lungs:  Normal respiratory effort, chest expands symmetrically. Lungs are clear to auscultation, no crackles or wheezes. Heart:  Normal rate and regular rhythm. S1 and S2 normal without gallop, murmur, click, rub or other extra sounds. Pulses:  2+ radials   Extremities:  no edema or cyanosis  Neurologic:  alert & oriented X3, cranial nerves II-XII intact, strength normal in all extremities, gait normal, DTRs symmetrical and normal, and finger-to-nose normal.   Psych:  Oriented X3, good eye  contact, not anxious appearing, not depressed appearing, and not agitated.     Impression & Recommendations:  Problem # 1:  SYNCOPE (ICD-780.2) Assessment Improved  Likely secondary to some degree of orthostasis and dehydration. Advised to continue at current dose of chlorthalidone, but to hold if systolic BP < 100 when she checks in AM; advised to stay well hydrated. Follow up with PCP in one month.   Orders: [facility]- Est  Level 4 ([address])  Problem # 2:  TOBACCO DEPENDENCE (ICD-305.1)  Currently quitting. Encouraged. Complete quit date is birthday.  Her updated medication list for this problem  includes:    Nicotine Polacrilex 2 Mg Lozg (Nicotine polacrilex) .Marland Kitchen... Dissolve 1 lozenge in mouth q 1-2 hours as needed cravings for 6 weeks, then q 2-4 hours as needed for 2 weeks, then q 4-8 hours prn for 2 weeks.  Orders: [facility]- Est  Level 4 ([address]214)  Complete Medication List: 1)  Depo-provera 150 Mg/ml Im Susp (Medroxyprogesterone acetate) .... 150mg  im injection every three months for contraception 2)  Fluoxetine Hcl 40 Mg Caps (Fluoxetine hcl) .... One capsule daily by mouth 3)  Cvs Omeprazole 20.6 Mg Cpdr (Omeprazole magnesium) .... One tablet by mouth one hour before bedtime. 4)  Zyprexa  .... Per [facility] 5)  Prilosec Otc 20 Mg Tbec (Omeprazole magnesium) .Marland Kitchen.. 1 tablet by mouth twice a day 6)  Naprosyn 500 Mg Tabs (Naproxen) .... One tablet by mouth three times a day for 10 days.  take with food. 7)  Nicotine Polacrilex 2 Mg Lozg (Nicotine polacrilex) .... Dissolve 1 lozenge in mouth q 1-2 hours as needed cravings for 6 weeks, then q 2-4 hours as needed for 2 weeks, then q 4-8 hours prn for 2 weeks. 8)  Proair Hfa 108 (90 Base) Mcg/act Aers (Albuterol sulfate) .... 2 sprays inhaled every 4 hours if needed for shorness of breath or wheezing 9)  Chlorthalidone 25 Mg Tabs (Chlorthalidone) .Marland Kitchen.. 1 tab by mouth qday

## 2010-04-14 NOTE — Progress Notes (Signed)
Summary: Rx Req  Phone Note Refill Request Call back at Home Phone [phone] Message from:  Patient  Refills Requested: Medication #1:  [facility] OMEPRAZOLE 20.6 MG CPDR One tablet by mouth one hour before bedtime. PT USES [facility] [address]  Initial call taken by: Clydell Hakim,  April 23, 2009 10:55 AM  Follow-up for Phone Call        will forward to MD. Follow-up by: Theresia Lo RN,  April 23, 2009 12:00 PM    Prescriptions: [facility]S OMEPRAZOLE 20.6 MG CPDR (OMEPRAZOLE MAGNESIUM) One tablet by mouth one hour before bedtime.  #30 x PRN   Entered and Authorized by:   Tawanna Cooler Carles Florea MD   Signed by:   Tawanna Cooler Hermila Millis MD on 04/24/2009   Method used:   Electronically to        [facility] [phone]* (retail)       [address]       [place], [place]  [address]       Ph: [phone]       Fax: [phone]   RxID:   [id number]

## 2010-04-14 NOTE — Letter (Signed)
Summary: Disability Letter  Disability Letter   Imported By: Bradly Bienenstock 06/30/2009 16:47:17  _____________________________________________________________________  External Attachment:    Type:   Image     Comment:   External Document

## 2010-04-14 NOTE — Miscellaneous (Signed)
  Clinical Lists Changes Medical Record Request Received medical record request Records went to [facility] Faxed on 01/24/2009 [facility]  February 13, 2010 2:34 PM

## 2010-04-14 NOTE — Miscellaneous (Signed)
Summary: prior auth  Clinical Lists Changes pa from [facility] with list of preferred drugs to pcp.Golden Circle RN  Jul 16, 2009 1:42 PM  Medicaid will not pay for Nicotrol Inhalers. I d/w Holy.  She agreed to switch to Nicotrol lozengers.   Medications: Removed medication of NICOTROL 10 MG INHA (NICOTINE) Inhale with frequent contiuous puffing  for about 20 minutes.  Use at least 6 cartridges a day, maxiumum 16 catridges a day. Added new medication of COMMIT 2 MG LOZG (NICOTINE POLACRILEX) 1 lozenger dissolve in mouth every 1-2 hours PRN for 6 wks, thenq2-4 hours PRN for 2 wks, then q  4-8 hours for 2 wks - Signed Rx of COMMIT 2 MG LOZG (NICOTINE POLACRILEX) 1 lozenger dissolve in mouth every 1-2 hours PRN for 6 wks, thenq2-4 hours PRN for 2 wks, then q  4-8 hours for 2 wks;  #72 x 1;  Signed;  Entered by: Tawanna Cooler Emilya Justen MD;  Authorized by: Tawanna Cooler Kiahna Banghart MD;  Method used: Electronically to [facility] [phone]*, [address] [place], [place]  [address], Ph: [phone], Fax: [phone]    Prescriptions: COMMIT 2 MG LOZG (NICOTINE POLACRILEX) 1 lozenger dissolve in mouth every 1-2 hours PRN for 6 wks, thenq2-4 hours PRN for 2 wks, then q  4-8 hours for 2 wks  #72 x 1   Entered and Authorized by:   Tawanna Cooler Torrey Horseman MD   Signed by:   Tawanna Cooler Jevonte Clanton MD on 07/16/2009   Method used:   Electronically to        [facility] [phone]* (retail)       [address]       [place], [place]  [address]       Ph: [phone]       Fax: [phone]   RxID:   [id number]

## 2010-04-14 NOTE — Progress Notes (Signed)
Summary: meds prob  Phone Note Call from Patient Call back at Home Phone [phone]   Caller: Patient Summary of Call: needs a PA before meds can be filled Initial call taken by: De Nurse,  Jul 18, 2009 9:13 AM  Follow-up for Phone Call        told her md has the form already. she is anxious to start. to pcp Follow-up by: Golden Circle RN,  Jul 18, 2009 9:17 AM  Additional Follow-up for Phone Call Additional follow up Details #1::        Please let patient know I hopefully have fixed the problem with her nicotene lozenger prescription at the pharmacy.  She should call them after 4 pm to see if they have gotten the authorization from [facility]. Additional Follow-up by: Tawanna Cooler McDiarmid MD,  Jul 18, 2009 2:32 PM    Additional Follow-up for Phone Call Additional follow up Details #2::    Patient informed of above, expressed understanding. Follow-up by: Garen Grams LPN,  Jul 18, 4780 3:43 PM

## 2010-04-14 NOTE — Progress Notes (Signed)
Summary: triage  Phone Note Call from Patient Call back at Home Phone [phone]   Caller: Patient Summary of Call: pt took BP this AM - 166/104, 141/104, 138/105, 143/101, 139/104  Initial call taken by: De Nurse,  August 22, 2009 8:41 AM  Follow-up for Phone Call        LM Follow-up by: Golden Circle RN,  August 22, 2009 8:43 AM  Additional Follow-up for Phone Call Additional follow up Details #1::        Pt returned call. Additional Follow-up by: Clydell Hakim,  August 22, 2009 8:46 AM    Additional Follow-up for Phone Call Additional follow up Details #2::    stated bp was high yesterday so she has been checking it. appt at 9:45 this am with Dr. Constance Goltz Follow-up by: Golden Circle RN,  August 22, 2009 8:49 AM  Additional Follow-up for Phone Call Additional follow up Details #3:: Details for Additional Follow-up Action Taken: I spoke with patient.  No worrisome symptoms.  Will have patient start low dose chlorthalidone and follow up with me at her next scheduled appointment.  Additional Follow-up by: Yousra Ivens MD,  August 22, 2009 9:17 AM  New/Updated Medications: CHLORTHALIDONE 25 MG TABS (CHLORTHALIDONE) One tablet by mouth daily Prescriptions: CHLORTHALIDONE 25 MG TABS (CHLORTHALIDONE) One tablet by mouth daily  #30 x 2   Entered and Authorized by:   Tawanna Cooler Veroncia Jezek MD   Signed by:   Tawanna Cooler Lasya Vetter MD on 08/22/2009   Method used:   Electronically to        [facility] [phone]* (retail)       [address]       [place], [place]  [address]       Ph: [phone]       Fax: [phone]   RxID:   [id number]   Appended Document: triage told her child's K form is at front desk

## 2010-04-14 NOTE — Progress Notes (Signed)
  Phone Note Outgoing Call   Call placed by: Lequita Asal  MD,  September 12, 2009 3:26 PM Summary of Call: potassium level normal. please inform. patient should go down to one 20 meq tab of KCL until her f/u appt Initial call taken by: Lequita Asal  MD,  September 12, 2009 3:26 PM  Follow-up for Phone Call        pt informed and will comply Follow-up by: Loralee Pacas CMA,  September 16, 2009 5:00 PM

## 2010-04-14 NOTE — Assessment & Plan Note (Signed)
Summary: stop smoking/rehab,df   Vital Signs:  Patient profile:   30 year old female Height:      65 inches Weight:      153 pounds BMI:     25.55 BSA:     1.77 Temp:     98.4 degrees F Pulse rate:   75 / minute BP sitting:   114 / 75  Vitals Entered By: Jone Baseman CMA (March 20, 2009 9:12 AM) CC: DISCUSS REHAB Is Patient Diabetic? No Pain Assessment Patient in pain? yes     Location: arthritis Intensity: 7   Primary Care Provider:  Essence Merle MD  CC:  DISCUSS REHAB.  History of Present Illness: Taresa wants to get into recovery from alcohol dependence. Drinking four to five drinks of spirits daily.  Has been trying to cut down on her own.  Drinking two drinks of spirits last two days.   She wants to stop drinking because she sees it impairing her relationships in her life. She drinks to control resentments and angers..While drinking once helped with these feelings, it does not work as well Smokes marijuana.  No tremors, fever, headache, n/v/d, seizure. Her disability application was denied.  She believes it was denied because of her alcohol use.   She continue to be seen at the [facility].  They changed her Seroquel to Zyprexa two weeks ago.  She has not picked the Zyprexa.  She continues on the prozac.   No thoughts of self-harm.  Sexual relations with husband in last two weeks  Genralized aches in shoulders, low back for last few days.      Habits & Providers  Alcohol-Tobacco-Diet     Alcohol drinks/day: 4     Alcohol type: spirits     >5/day in last 3 mos: yes     Feels need to cut down: yes     Feels annoyed by complaints: no     Feels guilty re: drinking: yes     Needs 'eye opener' in am: yes     Tobacco Status: current     Cigarette Packs/Day: <0.25  Current Medications (verified): 1)  Depo-Provera 150 Mg/ml Im Susp (Medroxyprogesterone Acetate) .... 150mg  Im Injection Every Three Months For Contraception 2)  Fluoxetine Hcl 40  Mg Caps (Fluoxetine Hcl) .... One Capsule Daily By Mouth 3)  Cvs Omeprazole 20.6 Mg Cpdr (Omeprazole Magnesium) .... One Tablet By Mouth One Hour Before Bedtime. 4)  Zyprexa .... Per [facility]  Allergies (verified): No Known Drug Allergies  Social History: Raised by mother in home broken by divorce at age 39 4 brothers or step-brothers Quit college @ [employer] to have baby,  Disability application denied 02/2009 Section 8 housing Smokes 1/4 to 1/2 pack per day,  Alcohol abuse (binging)  Marijuana abuse Has been [employer] member in past.   2nd child is former [redacted] week EGA premie, 3rd child is former [redacted] week EGA premie Unemployed Household Income is from her Dgt [name]'s disability check, and her husband's Workman's comp check.   Physical Exam  General:  alert, well-developed, well-nourished, and well-hydrated.   Psych:  Oriented X3, memory intact for recent and remote, normally interactive, good eye contact, not anxious appearing, and not depressed appearing. Occassionally tearful during interview when talking about her family    Impression & Recommendations:  Problem # 1:  ALCOHOL DEPENDENCE, CONTINUOUS (ICD-303.90) Assessment Deteriorated  Adwoa actively wants to enter recovery.  I believe she is at low risk for significant  alcohol withdrawal.  She does not appear intoxicated or  actively withdrawing currently. She was given telephone number for [facility], along with telephone number/URL for [facility] [place]. She said she would call them immediately.   My hope is that Cruz will enter an Intensive Outpatient Program thru [facility] with continued continuity via [facility].  I will see Madelyne next week to follow up on this plan.   Orders: [facility]- Est Level  3 ([address])  Problem # 2:  Hx of POLYMENORRHEA (ICD-626.2)  Urine pregancy negative.   Pt is S/P BTL.  Gave Depo-Provera 150 mg today for ongoing tx of her Heavy Menstrual Bleeding.  Her  updated medication list for this problem includes:    Depo-provera 150 Mg/ml Im Susp (Medroxyprogesterone acetate) ..... 150mg  im injection every three months for contraception  Orders: [facility]- Est Level  3 ([address])  Complete Medication List: 1)  Depo-provera 150 Mg/ml Im Susp (Medroxyprogesterone acetate) .... 150mg  im injection every three months for contraception 2)  Fluoxetine Hcl 40 Mg Caps (Fluoxetine hcl) .... One capsule daily by mouth 3)  Cvs Omeprazole 20.6 Mg Cpdr (Omeprazole magnesium) .... One tablet by mouth one hour before bedtime. 4)  Zyprexa  .... Per [facility]  Other Orders: U Preg-[facility] (09811) Ketorolac-Toradol 15mg  (B1478) Depo-Provera 150mg  (G9562)  Patient Instructions: 1)  Please schedule a follow-up appointment in 1 weeks.   Laboratory Results   Urine Tests  Date/Time Received: March 20, 2009 9:23 AM  Date/Time Reported: March 20, 2009 9:44 AM     Urine HCG: negative Comments: ...........test performed by...........Marland KitchenTerese Door, CMA      Medication Administration  Injection # 1:    Medication: Ketorolac-Toradol 15mg     Diagnosis: OSTEOARTHRITIS OF SPINE, NOS (ICD-721.90)    Route: IM    Site: RUOQ gluteus    Exp Date: 05/14/2010    Lot #: [id number]    Mfr: [email or web address]    Comments: 30mg  given    Patient tolerated injection without complications    Given by: Jone Baseman CMA (March 20, 2009 10:09 AM)  Injection # 2:    Medication: Depo-Provera 150mg     Diagnosis: CONTRACEPTIVE MANAGEMENT (ICD-V25.09)    Route: IM    Site: LUOQ gluteus    Exp Date: 06/14/2011    Lot #: [id number]    Mfr: [email or web address]    Comments: PT informed to RTC March 24 - April 7 for next injection    Patient tolerated injection without complications    Given by: Jone Baseman CMA (March 20, 2009 10:10 AM)  Orders Added: 1)  U Preg-[facility] [81025] 2)  Ketorolac-Toradol 15mg  [J1885] 3)  Depo-Provera 150mg  [J1055] 4)  [facility]- Est Level  3 [95284]

## 2010-04-14 NOTE — Assessment & Plan Note (Signed)
Summary: f/u,df   Vital Signs:  Patient profile:   30 year old female Height:      65 inches Weight:      162.3 pounds BMI:     27.11 Temp:     98.7 degrees F oral Pulse rate:   88 / minute BP sitting:   122 / 82  (left arm) Cuff size:   regular  Vitals Entered By: Garen Grams LPN (February 16, 2010 1:54 PM) CC: f/u Is Patient Diabetic? No Pain Assessment Patient in pain? no        Primary Care Provider:  TODD MCDIARMID MD  CC:  f/u.  History of Present Illness: [name] was not certain why she had made her appointment with me, but with conversation she bagan to talk about her troubled relationship with her husband, his family and her daughter, Sheria Lang. Over time she then said that she thinks her "stomach ulcer" is acting up, especially when she is in conflict with her husband. With further conversation she recalled that she is concerned about some intermittent pelvic pain.  Most of the interview was helping patient identify the reasons for her office visit.  She has not been on any medications for several months because she could not afford them despite having gotten her Medicaid enrollment back recently.  She has not been back to [facility] for her Bipolar disorder for several month.   Mood problems Intermittent sadness but not persistent Is seeing a therapist weekly, the same one her dgt Sheria Lang is seeing. She denies ahedonia.  She is having some difficulty falling and maintaining sleep. She denies loss of appetitie, or significant change in weight. She denies feeling guilty.  No thoughts of harming herself or others. Has had some difficulty with concentrating and organizing thoughts about her current situation Feels very tired and fatigued. She feels that she is not functioning as well as she has in past in role of mother and wife.  She has had two glasses of wine in last two weeks.  No drinking to intoxication.  She has not used other recreational drugs in  last month.   Denies hallucinations or unusual beliefs that others do not share She has become more active in her church and while she sometimes feels unaccepted there, she can also feel a connection there. She gfeels isolated from her friends patly because of her husband accusing her of seeing other men which she says is unfounded and sign of his irrational jealosy.  She thinks about leaving her husband and resents him not helping to support the family other than making car and car insurance payments.      [name] is interedsted in restarting her psychiatric medications  "Stomach Ulcer" taking omeprazole 20 mg daily with intermittent epigastric pain. No melena/hematochezia.  No N/V. No GER symptoms. Patient had Normal EGD and colonoscopy by Dr Leone Payor in 2008.  Habits & Providers  Alcohol-Tobacco-Diet     Alcohol drinks/day: <1     Alcohol Counseling: to decrease amount and/or frequency of alcohol intake     Alcohol type: spirits     >5/day in last 3 mos: yes     Feels need to cut down: yes     Feels annoyed by complaints: no     Feels guilty re: drinking: yes     Needs 'eye opener' in am: yes     Tobacco Status: current     Tobacco Counseling: to quit use of tobacco products  Cigarette Packs/Day: <0.25     Year Started: 11 yrs ago     Diet Counseling: to improve diet; diet is suboptimal  Current Problems (verified): 1)  Bipolar Affective Disorder, Mixed  (ICD-296.60) 2)  Asthma, Intermittent  (ICD-493.90) 3)  Dermatitis, Atopic  (ICD-691.8) 4)  Low Back Pain, Chronic  (ICD-724.2) 5)  Osteoarthritis of Spine, Nos  (ICD-721.90) 6)  Counseling Marital&partner Problems Unspecified  (ICD-V61.10) 7)  Hx of Intermittent Explosive Behavior  (ICD-312.34) 8)  Hx of Alcohol Dependence, Continuous  (ICD-303.90) 9)  Tobacco Dependence  (ICD-305.1) 10)  ? of Panic Attack  (ICD-300.01) 11)  Insomnia  (ICD-780.52) 12)  Psychosocial Problem  (ICD-V62.9) 13)  Hx of Attention Deficit  Hyperactivity Disorder, Adult  (ICD-314.01) 14)  Anger  (ICD-312.00) 15)  Headache, Tension  (ICD-307.81) 16)  Hx of Vaginitis, Bacterial  (ICD-616.10) 17)  Hx of Polymenorrhea  (ICD-626.2) 18)  Gastroesophageal Reflux Disease, Chronic  (ICD-530.81) 19)  Contraceptive Management  (ICD-V25.09) 20)  Hx of Dysplasia, Cervix, Mild  (ICD-622.11) 21)  Family Planning  (ICD-V25.09) 22)  Hx of Cervicitis, Chlamydial  (ICD-616.0) 23)  Hx of Cervicitis, Gonococcal, Acute  (ICD-098.15) 24)  Hx of Rhinitis, Allergic  (ICD-477.9)  Current Medications (verified): 1)  Depo-Provera 150 Mg/ml Im Susp (Medroxyprogesterone Acetate) .... 150mg  Im Injection Every Three Months For Contraception 2)  Omeprazole 40 Mg Cpdr (Omeprazole) .... One Tablet Twice A Day For Two Weeks , Then One Tablet Daily 3)  Proair Hfa 108 (90 Base) Mcg/act Aers (Albuterol Sulfate) .... 2 Sprays Inhaled Every 4 Hours If Needed For Shorness of Breath or Wheezing 4)  Chlorthalidone 25 Mg Tabs (Chlorthalidone) .Marland Kitchen.. 1 Tab By Mouth Qday  Allergies: No Known Drug Allergies PMH-FH-SH reviewed for relevance  Physical Exam  General:  Groomed, occassionally tearful during interview, sppech clear and prosodic, language organized and goal directed. Psych:  memory intact for recent and remote, normally interactive, and good eye contact.  Affect was congruent with mood.    Impression & Recommendations:  Problem # 1:  BIPOLAR AFFECTIVE DISORDER, MIXED (ICD-296.60) Assessment Deteriorated  It took a while for [name] to state what her concerns were.  Her mood appears to become more depressed with conflict with her husband, his family and her eldest dgt.  She is having some sleep difficulty.  I believe her initial difficulty in expressing the reason she was seeking care today may reflect to some degree her difficulty with organizing her thoughts about her current experiences and situation.   Will restart her Zoloft at 100mg  daily  and (re?)  start the atypical antipsychotic that she believes she was last on from the [facility], Zyprexa 10 mg at bedtime.  I will see her back next week to see how she is tolerating these psychotropic medications. I encouraged her to continue her counseling . Baseline LDL 119 and normal serum glucose today.  Will need to get release of information from [facility] to see what her last psychotropic medication regiment was. I will encourgae [name] to go back to the [facility]  Orders: [facility]- Est  Level 4 (84696)  Problem # 2:  HYPERTENSION, BENIGN ESSENTIAL (ICD-401.1) Assessment: Comment Only  Did not discuss this this OV.  Need to address in near future.  Tolerating Chlorthalidone, one talbet daily without syncope or dizziness. Her potassium today is within normal limits.  Her updated medication list for this problem includes:    Chlorthalidone 25 Mg Tabs (Chlorthalidone) .Marland Kitchen... 1 tab by mouth qday  Orders: [facility]- Est  Level 4 (52841)  Problem # 3:  GASTROESOPHAGEAL REFLUX DISEASE, CHRONIC (ICD-530.81)  Given no red flags and normal EGD in 2008, will just try trial of Increased PPI of omeprazole for two weeks.  Suspect much of her epigastric pain is a functional dyspepesia.  Will investigate more closely during F/U vist next week. Normal Hgb level today in office which is reassuring that no rush to judgment is necessary at this time.  The following medications were removed from the medication list:    Prilosec Otc 20 Mg Tbec (Omeprazole magnesium) .Marland Kitchen... 1 tablet by mouth twice a day Her updated medication list for this problem includes:    Omeprazole 40 Mg Cpdr (Omeprazole) ..... One tablet twice a day for two weeks , then one tablet daily  Orders: [facility]- Est  Level 4 (99214)  Complete Medication List: 1)  Depo-provera 150 Mg/ml Im Susp (Medroxyprogesterone acetate) .... 150mg  im injection every three months for contraception 2)  Omeprazole 40 Mg Cpdr (Omeprazole) .... One tablet  twice a day for two weeks , then one tablet daily 3)  Proair Hfa 108 (90 Base) Mcg/act Aers (Albuterol sulfate) .... 2 sprays inhaled every 4 hours if needed for shorness of breath or wheezing 4)  Chlorthalidone 25 Mg Tabs (Chlorthalidone) .Marland Kitchen.. 1 tab by mouth qday 5)  Zyprexa 10 Mg Tabs (Olanzapine) .... Take one talbet by moouth at bedtime  Other Orders: CBC-[facility] (32440) Basic Met-[facility] ([address]272-53664) Lipid-[facility] (40347-42595)  Patient Instructions: 1)  Please schedule a follow-up appointment in 1 weeks. May double book. 2)  Continue taking your Chlorthalidone for your blood pressure. 3)  Restart Zyprexa 10 mg tablet, one tablet daily 4)  Restart your Zoloft 100mg  , one tablet daily.  5)  Increase your Omeprazole to 40 mg by mouth twice a day for two weeks then decrease down to one tablet daily.  6)  There are refills for your ProAir inhaler for your asthma at the drug store.  Prescriptions: ZYPREXA 10 MG TABS (OLANZAPINE) Take one talbet by moouth at bedtime  #30 x 1   Entered and Authorized by:   Shirley Cooler McDiarmid MD   Signed by:   Shirley Cooler McDiarmid MD on 02/17/2010   Method used:   Electronically to        [facility] [phone]* (retail)       [address]       [place], [place]  [address]       Ph: [phone]       Fax: [phone]   RxID:   [id number] OMEPRAZOLE 40 MG CPDR (OMEPRAZOLE) One tablet twice a day for two weeks , then one tablet daily  #37 x 0   Entered and Authorized by:   Shirley Cooler McDiarmid MD   Signed by:   Shirley Cooler McDiarmid MD on 02/16/2010   Method used:   Electronically to        [facility] [phone]* (retail)       [address]       [place], [place]  [address]       Ph: [phone]       Fax: [phone]   RxID:   [id number] ZOLOFT 100 MG TABS (SERTRALINE HCL) [facility]  #30 x 1   Entered and Authorized by:   Shirley Cooler McDiarmid MD   Signed by:   Shirley Cooler McDiarmid MD on 02/16/2010   Method used:   Electronically to        [facility]  [id number]* (retail)       [address]       [place], [place]  [address]       Ph: [phone]       Fax: [phone]   RxID:   [id number] PROAIR HFA 108 (90 BASE) MCG/ACT AERS (ALBUTEROL SULFATE) 2 sprays inhaled every 4 hours if needed for shorness of breath or wheezing  #1 x 3   Entered and Authorized by:   Shirley Cooler McDiarmid MD   Signed by:   Shirley Cooler McDiarmid MD on 02/16/2010   Method used:   Electronically to        [id number]* (retail)       [address]       [place], [place]  [address]       Ph: [phone]       Fax: [phone]   RxID:   [id number] KLOR-CON M20 20 MEQ CR-TABS (POTASSIUM CHLORIDE CRYS CR) 2 tabs by mouth q4 hours x1 day, then 2 tabs by mouth daily.  #30 x 1   Entered and Authorized by:   Shirley Cooler McDiarmid MD   Signed by:   Shirley Cooler McDiarmid MD on 02/16/2010   Method used:   Electronically to        [facility] [phone]* (retail)       [address]       [place], [place]  [address]       Ph: [phone]       Fax: [phone]   RxID:   [id number] CHLORTHALIDONE 25 MG TABS (CHLORTHALIDONE) 1 tab by mouth qday  #30 x 1   Entered and Authorized by:   Shirley Cooler McDiarmid MD   Signed by:   Shirley Cooler McDiarmid MD on 02/16/2010   Method used:   Electronically to        [facility] [phone]* (retail)       [address]       [place], [place]  [address]       Ph: [phone]       Fax: [phone]   RxID:   [id number] ZYPREXA 10 MG TABS (OLANZAPINE) 1 tablet by mouth daily  #30 x 1   Entered and Authorized by:   Shirley Cooler McDiarmid MD   Signed by:   Shirley Cooler McDiarmid MD on 02/16/2010   Method used:   Electronically to        [facility] [phone]* (retail)       [address]       [place], [place]  [address]       Ph: [phone]       Fax: [phone]   RxID:   [id number]    Orders Added: 1)  CBC-[facility] [85027] 2)  Basic Met-[facility] [09233-00762] 3)  Lipid-[facility] [80061-22930] 4)  [facility]- Est  Level 4 [26333]     Prevention &  Chronic Care Immunizations   Influenza vaccine: Fluvax Non-MCR  (01/12/2010)   Influenza vaccine due: 11/29/2008    Tetanus booster: 11/30/2007: Tdap   Tetanus booster due: 11/29/2017    Pneumococcal vaccine: Done.  (03/15/2002)   Pneumococcal vaccine due: None  Other Screening   Pap smear: Normal  (10/12/2007)   Pap smear due: 10/11/2008   Smoking status: current  (02/16/2010)   Smoking cessation counseling: yes  (10/26/2007)  Hypertension   Last Blood Pressure: 122 / 82  (02/16/2010)   Serum creatinine: 0.87  (09/11/2009)   Serum potassium 3.7  (09/11/2009)    Hypertension  flowsheet reviewed?: Yes   Progress toward BP goal: At goal  Self-Management Support :   Personal Goals (by the next clinic visit) :      Personal blood pressure goal: 140/90  (02/16/2010)   Hypertension self-management support: Not documented    Hypertension self-management support not done because: Good outcomes  (02/16/2010)

## 2010-04-14 NOTE — Assessment & Plan Note (Signed)
Summary: f/u smoking cessation/eo   Vital Signs:  Patient profile:   30 year old female Height:      65 inches Weight:      152.5 pounds BMI:     25.47 Temp:     97.5 degrees F oral Pulse rate:   78 / minute BP sitting:   129 / 81  (left arm) Cuff size:   regular  Vitals Entered By: Gladstone Pih (Aug 04, 2009 9:25 AM) CC: F/U smoking cessation, form completed Is Patient Diabetic? No Pain Assessment Patient in pain? no        Primary Care Provider:  Sibel Khurana MD  CC:  F/U smoking cessation and form completed.  History of Present Illness: Smoking Cessation [name] did not start her smoking cessation process. She did pick up nicotene patches (not lozengers that we ordered) and has not started using the therapy yet. She is smoking 3 to 5 cigarettes a day.   She reports being abstinent from alcohol for last several weeks.   She chose not to start smoking cessation because of marital stress that has worsened recently.  Right thigh pain still a problem intermittently.  tolerating Naproxen.   I spent 20 minutes in counseling [name] around her marital stress.  She denies thoughts of harming herself or others.    Habits & Providers  Alcohol-Tobacco-Diet     Alcohol drinks/day: <1     Tobacco Status: current     Tobacco Counseling: to quit use of tobacco products     Cigarette Packs/Day: <0.25  Current Medications (verified): 1)  Depo-Provera 150 Mg/ml Im Susp (Medroxyprogesterone Acetate) .... 150mg  Im Injection Every Three Months For Contraception 2)  Fluoxetine Hcl 40 Mg Caps (Fluoxetine Hcl) .... One Capsule Daily By Mouth 3)  [facility] Omeprazole 20.6 Mg Cpdr (Omeprazole Magnesium) .... One Tablet By Mouth One Hour Before Bedtime. 4)  Zyprexa .... Per [facility] 5)  Prilosec Otc 20 Mg Tbec (Omeprazole Magnesium) .Marland Kitchen.. 1 Tablet By Mouth Twice A Day 6)  Naprosyn 500 Mg Tabs (Naproxen) .... One Tablet By Mouth Three Times A Day For 10 Days.  Take With Food. 7)   Nicotine Polacrilex 2 Mg Lozg (Nicotine Polacrilex) .... Dissolve 1 Lozenge in Mouth Q 1-2 Hours As Needed Cravings For 6 Weeks, Then Q 2-4 Hours As Needed For 2 Weeks, Then Q 4-8 Hours Prn For 2 Weeks.  Allergies: No Known Drug Allergies PMH reviewed for relevance  Physical Exam  General:  alert, well-nourished, and well-hydrated.   Psych:  memory intact for recent and remote, normally interactive, good eye contact, not anxious appearing, and not depressed appearing.     Impression & Recommendations:  Problem # 1:  COUNSELING MARITAL&PARTNER PROBLEMS UNSPECIFIED (ICD-V61.10) Assessment New  I spent 20 minutes in counseling with [name].  Ego supportive and problem solving process therapy provided.  I will see her back in one Month.  Orders: [facility]- Est Level  3 (47829)  Problem # 2:  TOBACCO DEPENDENCE (ICD-305.1)  [name] is planning to start the nicotene replacement this week along with smoking cessation.   Her updated medication list for this problem includes:    Nicotine Polacrilex 2 Mg Lozg (Nicotine polacrilex) .Marland Kitchen... Dissolve 1 lozenge in mouth q 1-2 hours as needed cravings for 6 weeks, then q 2-4 hours as needed for 2 weeks, then q 4-8 hours prn for 2 weeks.  Orders: [facility]- Est Level  3 (56213)  Problem # 3:  Sx of  THIGH PAIN (ICD-729.5)  Intermittent pain. If present on next visit will do imaging and possibly P.T. next visit.   Orders: [facility]- Est Level  3 (99213)  Complete Medication List: 1)  Depo-provera 150 Mg/ml Im Susp (Medroxyprogesterone acetate) .... 150mg  im injection every three months for contraception 2)  Fluoxetine Hcl 40 Mg Caps (Fluoxetine hcl) .... One capsule daily by mouth 3)  [facility]vs Omeprazole 20.6 Mg Cpdr (Omeprazole magnesium) .... One tablet by mouth one hour before bedtime. 4)  Zyprexa  .... Per [facility] 5)  Prilosec Otc 20 Mg Tbec (Omeprazole magnesium) .Marland Kitchen.. 1 tablet by mouth twice a day 6)  Naprosyn 500 Mg Tabs (Naproxen) .... One tablet  by mouth three times a day for 10 days.  take with food. 7)  Nicotine Polacrilex 2 Mg Lozg (Nicotine polacrilex) .... Dissolve 1 lozenge in mouth q 1-2 hours as needed cravings for 6 weeks, then q 2-4 hours as needed for 2 weeks, then q 4-8 hours prn for 2 weeks.  Patient Instructions: 1)  Please schedule a follow-up appointment in 1 month.
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
## 2010-04-17 ENCOUNTER — Inpatient Hospital Stay (INDEPENDENT_AMBULATORY_CARE_PROVIDER_SITE_OTHER)
Admission: RE | Admit: 2010-04-17 | Discharge: 2010-04-17 | Disposition: A | Discharge status: Home / Self Care | Payer: Medicaid Other | Source: Ambulatory Visit | Attending: Family Medicine | Admitting: Family Medicine

## 2010-04-17 DIAGNOSIS — M719 Bursopathy, unspecified: Secondary | ICD-10-CM

## 2010-04-17 DIAGNOSIS — M67919 Unspecified disorder of synovium and tendon, unspecified shoulder: Secondary | ICD-10-CM

## 2010-04-20 ENCOUNTER — Encounter: Payer: Self-pay | Admitting: *Deleted

## 2010-04-22 ENCOUNTER — Encounter: Payer: Self-pay | Admitting: Family Medicine

## 2010-04-23 ENCOUNTER — Ambulatory Visit (INDEPENDENT_AMBULATORY_CARE_PROVIDER_SITE_OTHER): Payer: Medicaid Other | Admitting: Family Medicine

## 2010-04-23 ENCOUNTER — Encounter: Payer: Self-pay | Admitting: Family Medicine

## 2010-04-23 ENCOUNTER — Other Ambulatory Visit: Payer: Self-pay | Admitting: Family Medicine

## 2010-04-23 VITALS — BP 112/80 | HR 72 | Temp 98.0°F | Ht 65.0 in | Wt 164.9 lb

## 2010-04-23 DIAGNOSIS — Z309 Encounter for contraceptive management, unspecified: Secondary | ICD-10-CM | POA: Insufficient documentation

## 2010-04-23 DIAGNOSIS — J069 Acute upper respiratory infection, unspecified: Secondary | ICD-10-CM

## 2010-04-23 DIAGNOSIS — M25519 Pain in unspecified shoulder: Secondary | ICD-10-CM

## 2010-04-23 MED ORDER — MEDROXYPROGESTERONE ACETATE 150 MG/ML IM SUSP
150.0000 mg | Freq: Once | INTRAMUSCULAR | Status: AC
  Administered 2010-04-23: 150 mg via INTRAMUSCULAR

## 2010-04-23 MED ORDER — CHLORPHEN-PSEUDOEPH-METHSCOP 120-2.5 & 8-2.5 MG PO TBCR: EXTENDED_RELEASE_TABLET | ORAL | Status: DC

## 2010-04-23 MED ORDER — SERTRALINE HCL 100 MG PO TABS: 100.0000 mg | ORAL_TABLET | Freq: Every day | ORAL | Status: DC

## 2010-04-23 NOTE — Assessment & Plan Note (Signed)
New problem. Recommend combo decongestant/chlorpheniramine as needed.  RTC if not improved in one week.

## 2010-04-23 NOTE — Patient Instructions (Addendum)
YOu may have a rotator cuff tenidinits. The injection steroid should start improving the pain in the next next or two. Let me know if you develop any swelling or redness or fever  Spell the alphabet with your arm hanging down for one minute ten times a day.   Take the antihistamine decongestant twice a day as needed for head and nasal congestion

## 2010-04-23 NOTE — Progress Notes (Signed)
  Subjective:    Patient ID: [name], female    DOB: 1980-09-16, 29 y.o.   MRN: [id number]  Shoulder Pain  The pain is present in the right shoulder (Right shoulder blade region). This is a new (Pt seen at [facility] on 2/3 and diagnosed with shoulder  tendonitis.  Treated with Prednisone  dose pack with only little improvement in pain and function of shoulder.   Pt is right handed and lifts her 31 year old frequently ) problem. The current episode started in the past 7 days. There has been no history of extremity trauma. The problem occurs intermittently. The problem has been unchanged. The pain is at a severity of 8/10. Associated symptoms include a limited range of motion. Pertinent negatives include no fever, numbness or tingling. Associated symptoms comments: Clicking with movement. Exacerbated by: movement of shoulder.  worse at night keeping her from sleeping. She has tried heat and NSAIDS (Prednisone from [facility] ) for the symptoms.  URI       Review of Systems  Constitutional: Negative for fever.  Musculoskeletal:       No swelling in shoulder.   Neurological: Negative for tingling, weakness and numbness.  (     Objective:   Physical Exam  Constitutional: She appears well-nourished.  HENT:  Right Ear: Tympanic membrane normal.  Left Ear: Tympanic membrane normal.  Nose: No mucosal edema or rhinorrhea.  Mouth/Throat: Uvula is midline and mucous membranes are normal. No oropharyngeal exudate, posterior oropharyngeal edema, posterior oropharyngeal erythema or tonsillar abscesses.  Eyes: Conjunctivae are normal.  Musculoskeletal:       Right shoulder: She exhibits decreased range of motion, tenderness and pain. She exhibits no swelling and no effusion.  Neurological:  Reflex Scores:      Tricep reflexes are 1+ on the right side.      Bicep reflexes are 1+ on the right side.  (+) impingement sign.  TTP right trapezius/supraspinatus/infrospinatus/deltoid FROM neck  ext/flex/lateral rotation right and left.         Assessment & Plan:

## 2010-04-23 NOTE — Assessment & Plan Note (Signed)
Diagnostic and Therapeutic right glenohumeral joint injection of 20 mg triamcinolone with 3 ml lidocaine after sterile prep.  Pt tolerated procedure well. She had 50% improvement in pain and ability to move the right shoulder about ten minutes after the injection.  Pt to do passive ROM exercises ten times a day for two minutes at a time.  RTC in 2 weeks to assess response to treatment.

## 2010-05-04 ENCOUNTER — Other Ambulatory Visit: Payer: Self-pay | Admitting: Family Medicine

## 2010-05-04 MED ORDER — OMEPRAZOLE 40 MG PO CPDR: 40.0000 mg | DELAYED_RELEASE_CAPSULE | Freq: Every day | ORAL | Status: DC

## 2010-05-07 ENCOUNTER — Ambulatory Visit: Payer: Medicaid Other | Admitting: Family Medicine

## 2010-05-08 ENCOUNTER — Other Ambulatory Visit: Payer: Self-pay | Admitting: Family Medicine

## 2010-05-08 ENCOUNTER — Telehealth: Payer: Self-pay | Admitting: Family Medicine

## 2010-05-08 DIAGNOSIS — K219 Gastro-esophageal reflux disease without esophagitis: Secondary | ICD-10-CM

## 2010-05-08 MED ORDER — OMEPRAZOLE 40 MG PO CPDR: 40.0000 mg | DELAYED_RELEASE_CAPSULE | Freq: Every day | ORAL | Status: DC

## 2010-05-08 NOTE — Telephone Encounter (Signed)
Please notify patient that prescription for omeprazole was sent to [facility] on [address].

## 2010-05-08 NOTE — Telephone Encounter (Signed)
Will route to her PCP. 

## 2010-05-08 NOTE — Telephone Encounter (Signed)
Pateints mother informed

## 2010-05-08 NOTE — Telephone Encounter (Signed)
Pt has been waiting all week to get a refill on her omeprazole, is completely out & is having pain from an ulcer. Pt goes to [facility]/[address].

## 2010-05-25 ENCOUNTER — Ambulatory Visit: Payer: Medicaid Other | Admitting: Family Medicine

## 2010-05-25 ENCOUNTER — Encounter: Payer: Self-pay | Admitting: Family Medicine

## 2010-05-25 ENCOUNTER — Other Ambulatory Visit: Payer: Self-pay | Admitting: Family Medicine

## 2010-05-25 DIAGNOSIS — K219 Gastro-esophageal reflux disease without esophagitis: Secondary | ICD-10-CM

## 2010-05-25 DIAGNOSIS — N920 Excessive and frequent menstruation with regular cycle: Secondary | ICD-10-CM

## 2010-05-25 DIAGNOSIS — F3160 Bipolar disorder, current episode mixed, unspecified: Secondary | ICD-10-CM

## 2010-05-25 DIAGNOSIS — J45909 Unspecified asthma, uncomplicated: Secondary | ICD-10-CM

## 2010-05-25 DIAGNOSIS — F17200 Nicotine dependence, unspecified, uncomplicated: Secondary | ICD-10-CM

## 2010-05-25 DIAGNOSIS — I10 Essential (primary) hypertension: Secondary | ICD-10-CM

## 2010-06-01 LAB — URINALYSIS, ROUTINE W REFLEX MICROSCOPIC
Bilirubin Urine: NEGATIVE
Hgb urine dipstick: NEGATIVE
Nitrite: NEGATIVE
Specific Gravity, Urine: 1.025 (ref 1.005–1.030)
pH: 6 (ref 5.0–8.0)

## 2010-06-01 LAB — DIFFERENTIAL
Basophils Relative: 1 % (ref 0–1)
Eosinophils Absolute: 0.1 10*3/uL (ref 0.0–0.7)
Eosinophils Relative: 1 % (ref 0–5)
Monocytes Absolute: 0.3 10*3/uL (ref 0.1–1.0)
Monocytes Relative: 5 % (ref 3–12)

## 2010-06-01 LAB — CBC
Hemoglobin: 15.1 g/dL — ABNORMAL HIGH (ref 12.0–15.0)
Platelets: 205 10*3/uL (ref 150–400)
RDW: 13.1 % (ref 11.5–15.5)
WBC: 6.2 10*3/uL (ref 4.0–10.5)

## 2010-06-01 LAB — COMPREHENSIVE METABOLIC PANEL
ALT: 17 U/L (ref 0–35)
Albumin: 4.4 g/dL (ref 3.5–5.2)
Alkaline Phosphatase: 62 U/L (ref 39–117)
Chloride: 100 mEq/L (ref 96–112)
Potassium: 3.2 mEq/L — ABNORMAL LOW (ref 3.5–5.1)
Sodium: 139 mEq/L (ref 135–145)
Total Bilirubin: 0.5 mg/dL (ref 0.3–1.2)
Total Protein: 7.4 g/dL (ref 6.0–8.3)

## 2010-06-01 LAB — URINE CULTURE: Colony Count: NO GROWTH

## 2010-06-02 ENCOUNTER — Telehealth: Payer: Self-pay | Admitting: Family Medicine

## 2010-06-02 NOTE — Telephone Encounter (Signed)
To Dr.Mcdiarmid

## 2010-06-02 NOTE — Telephone Encounter (Signed)
Wants referral Dr Onalee Hua Annalee Genta- [facility] - for her sinus drainage & acid reflux

## 2010-06-02 NOTE — Telephone Encounter (Signed)
Pt informed and agreeable. Will call back to schedule appt [place], [name]

## 2010-06-02 NOTE — Telephone Encounter (Signed)
[name] will need to be seen at [facility] prior to referral to ENT.  She may make appointment to see me or may make appointment as Same Day Appointment with first available provider.

## 2010-06-08 ENCOUNTER — Telehealth: Payer: Self-pay | Admitting: Family Medicine

## 2010-06-08 ENCOUNTER — Ambulatory Visit (INDEPENDENT_AMBULATORY_CARE_PROVIDER_SITE_OTHER): Payer: Medicaid Other | Admitting: *Deleted

## 2010-06-08 DIAGNOSIS — Z111 Encounter for screening for respiratory tuberculosis: Secondary | ICD-10-CM

## 2010-06-08 NOTE — Telephone Encounter (Signed)
Benjamin said that she needs an appt with you to get a referral.  Your first available is the middle of April and she doesn't want to wait that long.  Is there another time you could see her?

## 2010-06-09 ENCOUNTER — Other Ambulatory Visit: Payer: Self-pay | Admitting: Family Medicine

## 2010-06-09 DIAGNOSIS — J45909 Unspecified asthma, uncomplicated: Secondary | ICD-10-CM

## 2010-06-09 MED ORDER — ALBUTEROL SULFATE HFA 108 (90 BASE) MCG/ACT IN AERS: 2.0000 | INHALATION_SPRAY | RESPIRATORY_TRACT | Status: DC | PRN

## 2010-06-09 MED ORDER — CETIRIZINE HCL 10 MG PO TABS: 10.0000 mg | ORAL_TABLET | Freq: Every day | ORAL | Status: DC

## 2010-06-09 MED ORDER — FLUTICASONE PROPIONATE 50 MCG/ACT NA SUSP: NASAL | Status: DC

## 2010-06-09 NOTE — Telephone Encounter (Signed)
Ask Ms Reifsteck to try allergy medications I called into her [facility] pharmacy on [address] to see if these will help before she goes to see a specialist.

## 2010-06-10 ENCOUNTER — Ambulatory Visit: Payer: Medicaid Other

## 2010-06-12 ENCOUNTER — Telehealth: Payer: Self-pay | Admitting: Family Medicine

## 2010-06-12 DIAGNOSIS — I10 Essential (primary) hypertension: Secondary | ICD-10-CM

## 2010-06-12 MED ORDER — CHLORTHALIDONE 25 MG PO TABS: 25.0000 mg | ORAL_TABLET | Freq: Every day | ORAL | Status: DC

## 2010-06-12 NOTE — Telephone Encounter (Signed)
Please let Ms Mault know that her chlorthalidone Rx was sent to her pharmacy.

## 2010-06-12 NOTE — Telephone Encounter (Signed)
Pt has been out of Chlorithalidone for 2 days [facility] [address]

## 2010-06-12 NOTE — Telephone Encounter (Signed)
Refill request

## 2010-06-12 NOTE — Telephone Encounter (Signed)
Pt informed

## 2010-07-14 ENCOUNTER — Ambulatory Visit (INDEPENDENT_AMBULATORY_CARE_PROVIDER_SITE_OTHER): Payer: Medicaid Other | Admitting: *Deleted

## 2010-07-14 DIAGNOSIS — Z309 Encounter for contraceptive management, unspecified: Secondary | ICD-10-CM

## 2010-07-14 MED ORDER — MEDROXYPROGESTERONE ACETATE 150 MG/ML IM SUSP
150.0000 mg | Freq: Once | INTRAMUSCULAR | Status: AC
  Administered 2010-07-14: 150 mg via INTRAMUSCULAR

## 2010-07-28 NOTE — Discharge Summary (Signed)
[name], WREDE              ACCOUNT NO.:  [id number]   MEDICAL RECORD NO.:  [id number]          PATIENT TYPE:  WOC   LOCATION:  WOC                          FACILITY:  [facility]   PHYSICIAN:  Lesly Dukes, M.D. DATE OF BIRTH:  09-01-1980   DATE OF ADMISSION:  02/16/2007  DATE OF DISCHARGE:  02/23/2007                               DISCHARGE SUMMARY   HISTORY OF PRESENT ILLNESS:  The patient is a 30 year old G3, Para 1-1-0-  2, at 27-5/7 weeks estimated gestational age, who presented on February 21, 2007 with pre-term contractions. She has a history of a pre-term  delivery around 27 weeks. She had a slightly more advanced cervical  examination than the last time she was checked, approximately a week  ago. She was 2 to 3 cm, 50% effaced, and high. A week ago, she was 1 adn  50 and high. Her Procardia was stopped. She was started on Indocin and  this seemed to have helped. She was on this for approximately 48 hours  and her cervix is unchanged. She highly desires to go home because she  is having some issues with her family. She will go home today, come back  tomorrow for a cervical examination. She also is given strict pre-term  labor precautions and will come to the emergency room in the meantime,  if contractions start back up or cervical contractions resume. She  understands that she is to be on strict bedrest and strict vaginal rest.  Her followup appointment is in the [facility] at 11:00 a.m.  where Karlton Lemon will check her cervix, because he has done several  of her checks in the past and will be able to tell if she has made  cervical change.   DISCHARGE MEDICATIONS:  1. Prozac 20 mg p.o. daily.  2. Prenatal vitamin 1 tab p.o. daily.   DISCHARGE INSTRUCTIONS:  1. Vaginal rest.  2. Bedrest, may shower.  3. Come to the hospital if she has more than 5 contractions an hour,      breaks her water, has vaginal bleeding, or decreased fetal       movement.      Lesly Dukes, M.D.  Electronically Signed     KHL/MEDQ  D:  02/23/2007  T:  02/23/2007  Job:  [id number]

## 2010-07-28 NOTE — Op Note (Signed)
[name], SCHNIDER              ACCOUNT NO.:  [id number]   MEDICAL RECORD NO.:  [id number]          PATIENT TYPE:  INP   LOCATION:                                [facility]   PHYSICIAN:  Phil D. Okey Dupre, M.D.     DATE OF BIRTH:  [birth year]   DATE OF PROCEDURE:  04/08/2007  DATE OF DISCHARGE:                               OPERATIVE REPORT   PROCEDURE PERFORMED:  Bilateral tubal occlusion with Filshie clips.   PREOPERATIVE DIAGNOSIS:  Voluntary sterilization.   POSTOPERATIVE DIAGNOSIS:  Voluntary sterilization.   SURGEON:  Javier Glazier. Okey Dupre, M.D.   ANESTHESIA:  Spinal.   ESTIMATED BLOOD LOSS:  Minimal.   POSTOPERATIVE CONDITION:  Satisfactory.   SPECIMENS TO PATHOLOGY:  None.   PROCEDURE IN DETAIL:  The procedure went as follows.  Under satisfactory  spinal anesthesia, with the patient in dorsal supine position, the  abdomen was prepped and draped in the usual sterile manner and entered  through a transverse incision situated 1 cm below the umbilicus and  extending for a total length of 4 cm.  The abdomen was entered by layers  and entering the peritoneal cavity.  Each fallopian tube was identified,  followed out to the fimbriated end, and Filshie clips applied to the  midportion of each tube. No sign of any bleeding.  The peritoneum and  fascia were closed with continuous running zero Vicryl suture.  Atraumatic needle skin edges were approximated with Dermabond skin  adhesive.  Dry sterile dressing was applied, and the patient was  transferred to the recovery room in satisfactory condition.      Phil D. Okey Dupre, M.D.  Electronically Signed     PDR/MEDQ  D:  04/08/2007  T:  04/09/2007  Job:  [id number]

## 2010-07-28 NOTE — Discharge Summary (Signed)
[name], NELSON              ACCOUNT NO.:  [id number]   MEDICAL RECORD NO.:  [id number]          PATIENT TYPE:  INP   LOCATION:  9153                          [facility]ILITY[facility]  [facility]   PHYSICIAN:  Norton Blizzard, MD    DATE OF BIRTH:  1980/09/06   DATE OF ADMISSION:  01/26/2007  DATE OF DISCHARGE:  01/28/2007                               DISCHARGE SUMMARY   ADMISSION DIAGNOSIS:  Shortened cervix, rule out preterm labor.   DISCHARGE DIAGNOSIS:  Shortened cervix, no preterm labor.   HISTORY OF PRESENT ILLNESS:  The patient is a 30 year old G3, P1-1-0-2,  currently at 24-1/7 weeks who was admitted for concern of preterm labor.  The patient was seen in the clinic on January 26, 2007, with a chief  complaint of abdominal pain.  On examination she was found to be dilated  to 1-cm and a cervical length also of 1-cm.  Given the concern for  preterm labor, she was admitted for further observation.  Of note, the  patient denied any vaginal bleeding, loss of fluid, and had good fetal  movement.   PAST MEDICAL HISTORY:  1. Asthma.  2. Acid reflux.  3. Depression.   PAST SURGICAL HISTORY:  Eye surgery as a child.   PAST GYNECOLOGIC HISTORY:  1. History of abnormal Pap smears.  2. History of Gonorrhea and Chlamydia.   PAST OBSTETRICAL HISTORY:  1. One full term delivery.  2. One preterm delivery at 27 weeks.  Vaginal delivery for both.   SOCIAL HISTORY:  Quit tobacco and alcohol early in this pregnancy.  No  drugs.   FAMILY HISTORY:  Unremarkable.   MEDICATIONS:  Procardia, prenatal vitamins, Prozac, and weekly 17-P  injections.   ADMISSION PHYSICAL EXAMINATION:  VITAL SIGNS:  Temperature 98, blood  pressure 111/68, pulse 98, respirations 18.  GENERAL:  No apparent distress.  HEART:  Regular rate and rhythm.  LUNGS:  Clear to auscultation bilaterally.  ABDOMEN:  Soft, nontender, nondistended.  Gravid.  PELVIC:  On examination she was found to be fingertip, 50%, and high.  She  had a reassuring fetal heart tracing with a baseline in the 150s,  with moderate variability and no contractions on tocometer.   ADMISSION LABORATORY STUDIES:  Negative Gonorrhea, negative Chlamydia,  negative wet prep.  Ultrasound for growth on January 26, 2007, showed  an EFW of 579 grams, normal amniotic fluid volume, vertex, cervical  length 0.83-cm with V-shaped funneling.   HOSPITAL COURSE:  The patient was admitted and exhibited no signs or  symptoms of chorioamnionitis.  She remained afebrile, and there was no  maternal fetal tachycardia or abdominal tenderness.  She was not noted  to have any contractions.  She had several cervical checks which  remained stable at fingertip, 50, and high.  Throughout her visit she  did have reassuring fetal heart tracings in the 140s to 150s.  The  patient was also continued on her Procardia 30 mg b.i.d. and received a  full course of dexamethasone; her last dose was at 8 a.m. on January 28, 2007.  Of note, the patient was  seen by an MFM consult, who agreed  with the plan and also recommended further bed rest with bathroom  privileges.  On the morning of January 28, 2007, the patient was  asymptomatic.  Her cervical exam was fingertip, 50, and high which was  stable.  Given that she had no signs of preterm labor or  chorioamnionitis, the plan was made to send her home on bed rest with  bathroom privileges.   DISCHARGE CONDITION:  Stable.   DISCHARGE MEDICATIONS:  1. Procardia 30 mg p.o. b.i.d.  2. Prozac 10 mg p.o. b.i.d.  3. Prenatal vitamins p.o. daily.  4. 17-P injection IM weekly.   DISCHARGE INSTRUCTIONS:  The patient was told to return to the clinic or  come to the MAU for any contractions, vaginal bleeding, loss of fluid,  decreased fetal movement, or any other concerns.  She was told that bed  rest was recommended with bathroom privileges given her shortened  cervix; also recommended pelvic rest.  She was told to call for  any  concerns.  The patient is to follow up with [facility].      Norton Blizzard, MD  Electronically Signed     UAD/MEDQ  D:  01/28/2007  T:  01/29/2007  Job:  [id number]

## 2010-07-28 NOTE — Discharge Summary (Signed)
[name], [name]              ACCOUNT NO.:  [id number]   MEDICAL RECORD NO.:  [id number]          PATIENT TYPE:  INP   LOCATION:  9156                          FACILITY:  [facility]   PHYSICIAN:  Allie Bossier, MD        DATE OF BIRTH:  08-10-1980   DATE OF ADMISSION:  02/26/2007  DATE OF DISCHARGE:  03/01/2007                               DISCHARGE SUMMARY   ADMISSION DIAGNOSIS:  1. Intrauterine pregnancy at 28 weeks 0 days.  2. Preterm labor.  3. Persistent cough.   DISCHARGE DIAGNOSES:  1. Intrauterine gestation at 28 weeks 3 days.  2. Preterm labor, status post tocolysis with good response.  3. Bronchitis.   PROCEDURES:  1.Patient had a chest x-ray performed on December 14,  showing no acute abnormality.  1. Patient had an OB ultrasound performed on December 14, that showed      single intrauterine gestation, cephalic presentation.  Placenta was      anterior, above the cervical os.  AFI was 8.53.  2. Patient had repeat ultrasound performed on February 28, 2007,      showing a single intrauterine gestation, cephalic presentation.      Placenta was anterior above the cervical os.  The AFI was 11.67 at      that time.   COMPLICATIONS:  None.   CONSULTATIONS:  Neonatology.   PERTINENT LABORATORY FINDINGS:  Patient had an admission urinalysis that  showed 15 ketones and 2 urobilinogen, but it was otherwise normal.  She  had an admission CBC with white blood cell count 10, hemoglobin 11.5,  hematocrit 33, platelets 178, neutrophils 92, lymphocytes 6.  She had a  followup CBC later in the evening on hospital day #1.  White blood cell  count 10, hemoglobin 11, hematocrit 31.8, platelets 164, neutrophils 92  and lymphocytes 5.   BRIEF PERTINENT ADMISSION HISTORY:  This is a 30 year old gravida 3,  para 1-1-0-2, at 28 weeks 0 days with history of recent admission for  preterm labor that presents with complaints of increased pelvic pressure  and contractions.  She also states that  she was leaking fluid.  She has  a history of one preterm delivery at 27 weeks, currently receiving 17P  injections in the high-risk clinic.  On admission to the MAU, she was  noted to have cervical change from previous check to 3-4 cm dilated, 80%  effaced, -3 station.  She was assessed for rupture and found to be  pooling negative, fern negative, weakly positive nitrazine.  Her AFI at  that time was 8.  She was admitted with preterm labor.   HOSPITAL COURSE:  Upon admission, patient was started on Procardia 10 mg  p.o. every 20 minutes times four doses, then 20 mg every 6 hours.  She  was placed on continuous tocometry and NSTs daily.  She did have a cough  and was assessed with chest x-ray, which showed no abnormality.  This  cough persisted.  She was treated for bronchitis with Zithromax 500 mg  on day #1, followed by 250 mg days 2-5.  She  had not completed the  course prior to discharge.  She had received steroids on a previous  admission and she did not receive repeat doses of steroids.  She did  receive one albuterol treatment in the MAU for which she had a  tachycardic and anxiety-filled response.  Albuterol treatments were  discontinued at that time.  For her cough, she was also treated with  Tussionex, guaifenesin and Advair.  At time of discharge, her cough had  improved on the medications, but she did have some crackles and rhonchi  in her lungs.   She was reassessed on hospital day #3.  Cervix was felt to be unchanged  at that time.  She was monitored one more night and cervix was unchanged  on hospital day #4, at time of discharge.  Throughout her stay,  tocometry showed contractions as frequently as every ten minutes with a  lot of irritability.  At time of discharge, she was contracting 3-4  times an hour.  The baby did have reassuring NSTs while she was here.  Due to tocolysis without significant cervical change, patient was to be  discharged home on strict bed  rest.   DISCHARGE STATUS:  Stable.   DISCHARGE MEDICATIONS:  1. Continue home medications of prenatal vitamins, Prozac and      albuterol.  Patient has not seemed to respond in the same way to      albuterol inhaler in the past and it is felt safe that she can      continue this.  2. Procardia 20 mg one tablet by mouth every 6 hours.  3. Guaifenesin 600 mg one tablet by mouth twice daily as needed for      cough.  4. Zithromax 250 mg one tablet by mouth daily for four days.  5. Tussionex 5 mL by mouth every 12 hours as needed for cough.  6. Advair 250/50 disc one inhalation twice daily.  Labeled for home      use;no prescription provided.   DISCHARGE INSTRUCTIONS:  1. Discharge to home.  2. Regular diet.  3. Bed rest with bathroom privileges.  4. Nothing in the vagina for the remainder of the pregnancy.  5. Patient is to follow up in high-risk clinic on December 18 at 8:45      in the morning.      Karlton Lemon, MD  Electronically Signed     ______________________________  Allie Bossier, MD    NS/MEDQ  D:  03/01/2007  T:  03/01/2007  Job:  [id number]

## 2010-07-31 NOTE — Discharge Summary (Signed)
NAMESANAII, CAPORASO              ACCOUNT NO.:  [id number]   MEDICAL RECORD NO.:  [id number]          PATIENT TYPE:  INP   LOCATION:  9101                          [facility]ILITY[facility]  [facility]   PHYSICIAN:  Adrian Blackwater, MDDATE OF BIRTH:  03/23/1980   DATE OF ADMISSION:  04/03/2004  DATE OF DISCHARGE:  04/06/2004                                 DISCHARGE SUMMARY   DISCHARGE DIAGNOSES:  1.  A 23-week 5-day intrauterine pregnancy admitted with preterm uterine      contractions.  2.  Vaginosis.   DISCHARGE MEDICATIONS:  1.  Ampicillin 500 mg one tablet q.6 h. during three days for GBS      prophylaxis.  2.  Metronidazole 500 mg one tablet twice a day for four days for bacterial      vaginosis.  3.  Prenatal vitamins.  4.  Ferrous sulfate one tablet t.i.d. with meals.   BRIEF HISTORY:  This is a 30 year old G2, para 1-0-0-1 who presents at 23  weeks and two days estimated gestational age to MAU with complaints of  abdominal cramp and nausea since three days prior to admission.  The patient  has a previous MAU visit on March 11, 2004, with preterm contractions and  occasional palpitations.  The patient's last menstrual period was August  2005, estimated due date Jul 29, 2004.   PHYSICAL EXAMINATION:  VITAL SIGNS:  Stable.  CARDIOVASCULAR:  The patient presents in Irregular rhythm, systolic atrial  murmur 2/6.  CERVIX:  It was 1 cm dilated, 30% effaced, high position and vertex.  Fetal  heart rate baseline was 166 and patient was having contractions every two to  four minutes, lasting 60 seconds.   ADMISSION LABORATORY DATA:  Urinalysis was negative and wet prep done at  [facility] showed clue cells, also GC and Chlamydia done on  April 03, 2004, was negative, GBS April 03, 2004, was positive.   The patient was admitted to the antenatal unit with ibuprofen and treatment  with  __________ at 600 mg q.6 h. for 72 hours.  Also with GBS prophylaxis,  Unasyn 3 g IV q.6  h. to complete treatment for five days and with Flagyl  treatment for bacterial vaginosis to complete a seven-day course.   During the hospital course, the patient remained hemodynamically stable with  occasional complaint of cramps and heart rate regular and occasions with  systolic atrial murmur present.  Fetal heart monitor shows 150 heart rate.  Tocometer shows irritability but no contractions.  The day of discharge, had  one isolated contraction.  Ibuprofen was discontinued after 72 hours.  Also,  hemoglobin was 10.4 and ferrous sulfate therapy was started at one tablet  b.i.d.   PROCEDURE:  None.   LABORATORY DATA:  CBC with wbc 8.3, hemoglobin 10.4, hematocrit 29.9,  platelets 220.  Urinalysis negative.   The patient had ultrasound done on April 04, 2004, for growth and  presentation and shows single fetus, 168 beats per minute with positive  fetal movement, no __________, presentation cephalic, placenta - no previa,  posterior grade I.  Amniotic fluid index normal  with __________ 4.2 cm,  normal for 24 weeks.  Fetal biometry concurrent with mean gestational age of  [redacted] weeks and five days and shows fetal anatomy present lateral ventricle,  four chamber heart present, stomach present, core insertion site present,  both kidneys present and bladder present.  Additionally shows a female fetus  and cervical length, transabdominal, was 3.1 cm.   Patient had EKG done due to irregular heart rate and previous positive  history.  It showed normal sinus rhythm with occasional premature  supraventricular complex, nonspecific T-wave abnormality.   The patient will have follow-up appointment at [facility] with  Anastasio Auerbach, M.D., on April 07, 2004, at 2:45 p.m.      IM/MEDQ  D:  04/06/2004  T:  04/06/2004  Job:  [id number]

## 2010-07-31 NOTE — Assessment & Plan Note (Signed)
[facility]                         GASTROENTEROLOGY OFFICE NOTE   [name], [name]                     MRN:          [id number]  DATE:06/17/2006                            DOB:          December 14, 1980    PROBLEM:  Ulcer and possible Crohn's.   HISTORY:  [name] is a pleasant 30 year old African American female who  is referred for evaluation of weight loss and Hemoccult positive stool.  She says that she has been having abdominal discomfort in her right mid  quadrant, and epigastrium, which seems to migrate, but has been fairly  constant over the past few months.  She has been placed on Zantac by Dr.  McDiarmid, and was just given a prescription for omeprazole.  She has  been on the Zantac over the past several weeks, which she feels has  helped quite a bit.  She says she was having a lot of problems with  nausea and heartburn, which have decreased significantly, and had also  been having a lot of urgency and cramping with loose stools.  She is now  actually constipated, and not having bowel movements on a daily basis.  She has not noted any melena or hematochezia herself.  Still having some  burning in her abdomen with eating at times, but also feels that this  better.  She says that as far as her weight loss is concerned, she was  stressed about a year ago when the weight loss started due to some  personal problems, but those have resolved.  She feels that her weight  loss is not due to anxiety, etc.  She says she was having a lot of  nausea and stomach upset, and therefore was eating less.  She reports  that she has lost from 196 down to 134 over about the past year.  She is  not on any regular aspirin or NSAIDS.  Denies any regular ETOH.   CURRENT MEDICATIONS:  Zantac 150 b.i.d.   ALLERGIES:  NO KNOWN DRUG ALLERGIES.   PAST MEDICAL HISTORY:  1. Pertinent for history of asthma in the 90s.  2. Depression 2001 and 2006.  3. Seasonal  allergies.   She has not had any abdominal surgeries.  She did have a remote eye  surgery as an infant and at age 63, type unspecified.   FAMILY HISTORY:  Positive for diabetes in her grandparents and aunts.  Breast cancer in a maternal aunt.  No family history of colon cancer,  polyps, or inflammatory bowel disease.   SOCIAL HISTORY:  The patient is married.  She is employed with  [employer], and actually starting a new job this coming week.  She has 2  children.  She is a nonsmoker and nondrinker currently.   REVIEW OF SYSTEMS:  Chronic allergy sinus symptoms, seasonal.  She does  have intermittent headaches, low back pain.  Last menstrual period was  June 01, 2006.  GI:  As outlined above.  CARDIOVASCULAR:  Negative.  PULMONARY:  Negative.  GENITOURINARY:  Negative.   PHYSICAL EXAMINATION:  A well-developed, thin, African American female,  in no acute distress.  Height is 5 feet 6.  Weight is 134.  Blood pressure 110/82.  Pulse in  the 80s.  HEENT:  Atraumatic and normocephalic.  EOMI.  PERLA.  Sclerae anicteric.  No JVD.  No nodes.  CARDIOVASCULAR:  Regular rate and rhythm with S1 and S2.  No murmur, rub  or gallop.  PULMONARY:  Clear to auscultation and percussion.  ABDOMEN:  Soft.  Bowel sounds are active.  There is no palpable mass or  hepatosplenomegaly.  She is tender in the right lower quadrant.  There  is no mass palpable.  No guarding.  RECTAL EXAM:  Hemoccult positive previously documented, and not  repeated.  EXTREMITIES:  Without clubbing, cyanosis or edema.   IMPRESSION:  A 30 year old African Amercian female with weight loss,  nausea, gastroesophageal reflux disease, abdominal pain, epigastric and  right lower quadrant, and Hemoccult positive stool.  I suspect that she  does have reflux, which is being controlled with acid blockers.  Rule  out peptic ulcer disease or other gastropathy.  Rule out inflammatory  bowel disease.  Rule out occult colon lesion.   Rule out celiac disease,  though less likely, could.   PLAN:  1. Schedule upper endoscopy and colonoscopy.  2. Continue omeprazole at 40 mg p.o. daily.  She was given additional      samples of OTC Prilosec today.      Mike Gip, PA-C  Electronically Signed      Iva Boop, MD,FACG  Electronically Signed   AE/MedQ  DD: 06/17/2006  DT: 06/18/2006  Job #: [id number]   cc:   Leighton Roach McDiarmid, M.D.
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
## 2010-07-31 NOTE — Discharge Summary (Signed)
[name], [name]              ACCOUNT NO.:  [id number]   MEDICAL RECORD NO.:  [id number]          PATIENT TYPE:  INP   LOCATION:  9151                          FACILITY:  [facility]   PHYSICIAN:  Phil D. Okey Dupre, M.D.     DATE OF BIRTH:  08-07-1980   DATE OF ADMISSION:  04/20/2004  DATE OF DISCHARGE:  04/23/2004                                 DISCHARGE SUMMARY   DISCHARGE DIAGNOSES:  1.  Preterm labor.  2.  Intrauterine pregnancy at 26 weeks.  3.  TBS negative.   DISCHARGE MEDICATIONS:  Procardia XL 30 mg p.o. b.i.d.   DISPOSITION:  Patient discharged to home.   FOLLOWUP:  Follow up appointment at [facility] at 11:30 a.m.  February 14.   PROCEDURES:  1.  OB ultrasound which showed amniotic fluid at 13.2 cm.  Estimated      gestational age [redacted] weeks and 0 days done on February 6 and assigned      estimated due date Jul 27, 2004 by this ultrasound.  Growth was 50-75      percentile with AC being approximately two weeks behind the rest of      growth.  Cervix was 1.6 cm dilated at the office.  BPP was 6/8.  2.  Ultrasound for fetal Dopplers.  UA was 2.78 which was within normal      limits.  MCA 2.10 which was also within normal limits.  AFI at that time      was 16.2.   BRIEF ADMISSION HISTORY:  This is a 30 year old G2, P1-0-0-1 who presented  at 26-1/7 week after being seen at [facility] and noted to  have cervical change.  The patient was contracting approximately two times  within 45 minutes on the monitor with irritability.  Cervix at that time was  noted to be 1 cm loose, approximately 40-50% effaced, soft posterior.  The  patient was admitted for threatened preterm labor.   HOSPITAL COURSE:  #1 - PRETERM LABOR.  The patient received beta Methasone  x2.  She was also placed on Unasyn q.6h. during hospitalization.  She was  begun on Procardia XL 30 mg b.i.d. and tolerated well.  She was maintained  on bedrest.  Cervix was rechecked prior to  discharge, was 1 cm, 50% and  posterior.  Felt to be stable for discharge concerning cervix at 19.  Also,  on tocometer, the patient only had approximately 1-2 contractions within an  hour, on continuous monitoring.  On external fetal monitoring, infant was  well with good reactivity.  No decelerations.   #2 - FETAL GROWTH DISCREPANCY.  AC again was noticed to be two weeks behind  the rest of the infant's growth.  Fetal Dopplers were done to rule out any  insufficiency and flow, and again, those were within normal limits.   #3 - GBS was redone during hospitalization and was negative.      AD/MEDQ  D:  04/23/2004  T:  04/23/2004  Job:  [id number]   cc:   Anastasio Auerbach, MD  Fax: [phone]

## 2010-08-03 ENCOUNTER — Encounter: Payer: Medicaid Other | Admitting: Family Medicine

## 2010-08-17 ENCOUNTER — Encounter: Payer: Medicaid Other | Admitting: Family Medicine

## 2010-08-31 ENCOUNTER — Encounter: Payer: Medicaid Other | Admitting: Family Medicine

## 2010-09-17 ENCOUNTER — Other Ambulatory Visit: Payer: Self-pay | Admitting: Family Medicine

## 2010-09-17 DIAGNOSIS — K219 Gastro-esophageal reflux disease without esophagitis: Secondary | ICD-10-CM

## 2010-09-17 NOTE — Telephone Encounter (Signed)
Refill request

## 2010-09-21 ENCOUNTER — Ambulatory Visit (INDEPENDENT_AMBULATORY_CARE_PROVIDER_SITE_OTHER): Payer: Medicaid Other | Admitting: *Deleted

## 2010-09-21 DIAGNOSIS — Z309 Encounter for contraceptive management, unspecified: Secondary | ICD-10-CM

## 2010-09-21 MED ORDER — MEDROXYPROGESTERONE ACETATE 150 MG/ML IM SUSP
150.0000 mg | INTRAMUSCULAR | Status: DC
  Administered 2010-09-21: 150 mg via INTRAMUSCULAR

## 2010-09-21 NOTE — Progress Notes (Signed)
Patient came in 8 days early for her injection.  States that she was beginning to cramp.  Check with Dr. [place] who said it was okay to give it early.

## 2010-10-01 ENCOUNTER — Encounter: Payer: Self-pay | Admitting: Family Medicine

## 2010-10-01 ENCOUNTER — Ambulatory Visit (INDEPENDENT_AMBULATORY_CARE_PROVIDER_SITE_OTHER): Payer: Medicaid Other | Admitting: Family Medicine

## 2010-10-01 DIAGNOSIS — M25519 Pain in unspecified shoulder: Secondary | ICD-10-CM

## 2010-10-01 DIAGNOSIS — K219 Gastro-esophageal reflux disease without esophagitis: Secondary | ICD-10-CM

## 2010-10-01 DIAGNOSIS — F3160 Bipolar disorder, current episode mixed, unspecified: Secondary | ICD-10-CM

## 2010-10-01 DIAGNOSIS — N920 Excessive and frequent menstruation with regular cycle: Secondary | ICD-10-CM

## 2010-10-01 DIAGNOSIS — Z79899 Other long term (current) drug therapy: Secondary | ICD-10-CM

## 2010-10-01 LAB — COMPREHENSIVE METABOLIC PANEL
ALT: 21 U/L (ref 0–35)
AST: 25 U/L (ref 0–37)
Alkaline Phosphatase: 58 U/L (ref 39–117)
BUN: 9 mg/dL (ref 6–23)
Chloride: 99 mEq/L (ref 96–112)
Creat: 0.82 mg/dL (ref 0.50–1.10)
Total Bilirubin: 0.5 mg/dL (ref 0.3–1.2)

## 2010-10-01 LAB — CBC WITH DIFFERENTIAL/PLATELET
Basophils Absolute: 0 10*3/uL (ref 0.0–0.1)
Basophils Relative: 0 % (ref 0–1)
Eosinophils Absolute: 0 10*3/uL (ref 0.0–0.7)
Eosinophils Relative: 1 % (ref 0–5)
Lymphocytes Relative: 54 % — ABNORMAL HIGH (ref 12–46)
MCHC: 34.1 g/dL (ref 30.0–36.0)
MCV: 91.9 fL (ref 78.0–100.0)
Platelets: 285 10*3/uL (ref 150–400)
RDW: 14.7 % (ref 11.5–15.5)
WBC: 5.5 10*3/uL (ref 4.0–10.5)

## 2010-10-01 MED ORDER — SERTRALINE HCL 100 MG PO TABS: 100.0000 mg | ORAL_TABLET | Freq: Every day | ORAL | Status: DC

## 2010-10-01 MED ORDER — OLANZAPINE 10 MG PO TABS: 15.0000 mg | ORAL_TABLET | Freq: Every day | ORAL | Status: DC

## 2010-10-01 MED ORDER — CETIRIZINE HCL 10 MG PO TABS: 10.0000 mg | ORAL_TABLET | Freq: Every day | ORAL | Status: DC

## 2010-10-01 MED ORDER — OMEPRAZOLE 40 MG PO CPDR: 40.0000 mg | DELAYED_RELEASE_CAPSULE | Freq: Two times a day (BID) | ORAL | Status: DC

## 2010-10-02 ENCOUNTER — Telehealth: Payer: Self-pay | Admitting: Family Medicine

## 2010-10-02 MED ORDER — TRIAMCINOLONE ACETONIDE 10 MG/ML IJ SUSP: 20.0000 mg | Freq: Once | INTRAMUSCULAR | Status: DC

## 2010-10-02 MED ORDER — LIDOCAINE HCL 1 % IJ SOLN: 1.0000 mL | Freq: Once | INTRAMUSCULAR | Status: DC

## 2010-10-02 NOTE — Assessment & Plan Note (Signed)
Very high score on PHQ-9 of 23. Emotive components along with its interference with cognition and motivation is impairing her relationships.  No clear manic component currently.  Vegatative component of increased appetite. Not drinking alcohol for 2 weeks and denies use of marijuana or other illicit substances. Plan to restart Zyprexa at 15 mg daily and Zoloft 100 mg daily.  RTC in 2 weeks to monitor adverse effects and response.  Will need to get patient back into counseling thru [facility].

## 2010-10-02 NOTE — Telephone Encounter (Signed)
Informed patient of lower potassium level.  She had 5 tablets of Klor Con 20 M. I asked her to take all 5 tablets, one at a time, over the next 2 days.   Asked her to eat some dried fruits like apricots to increase her dietary potassium intake while on thiazide.

## 2010-10-02 NOTE — Assessment & Plan Note (Addendum)
Suspect that current family, financial and social stressors are major contributors as causes of Ms Finnan's epigastric pain and increased GER sympotoms.   No red flags at this time.  Will plan to tx with omeprazole 40 mg daily for now and follow up in 4 weeks. Will discuss her prolonged menstural bleed at that time.
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
## 2010-10-02 NOTE — Assessment & Plan Note (Signed)
Pain with abduction right shoulder at about 30 degrees. Unable elevate shoulder above 90 degree. (+) Hawkins and Neer sign.

## 2010-10-05 ENCOUNTER — Ambulatory Visit: Payer: Medicaid Other | Admitting: Family Medicine

## 2010-10-30 ENCOUNTER — Telehealth: Payer: Self-pay | Admitting: Family Medicine

## 2010-10-30 NOTE — Telephone Encounter (Signed)
Please let patient know when she get to to see you asap.  Still having bleeding and cramping real bad.  Let Britta Mccreedy know when to get pt in

## 2010-11-02 NOTE — Telephone Encounter (Signed)
If she needs to be seen immediately, then Ms Brinkley will need to be seen by another provider, otherwise, I can see her at my next clinic appointment time.

## 2010-11-03 NOTE — Telephone Encounter (Signed)
appt scheduled for 8/30/@ 11:00 with provider.  Pt agreed.

## 2010-11-12 ENCOUNTER — Other Ambulatory Visit: Payer: Self-pay | Admitting: Family Medicine

## 2010-11-12 ENCOUNTER — Ambulatory Visit (INDEPENDENT_AMBULATORY_CARE_PROVIDER_SITE_OTHER): Payer: Medicaid Other | Admitting: Family Medicine

## 2010-11-12 ENCOUNTER — Other Ambulatory Visit (HOSPITAL_COMMUNITY)
Admission: RE | Admit: 2010-11-12 | Discharge: 2010-11-12 | Disposition: A | Discharge status: Home / Self Care | Payer: Medicaid Other | Source: Ambulatory Visit | Attending: Family Medicine | Admitting: Family Medicine

## 2010-11-12 VITALS — BP 120/84 | HR 103 | Temp 98.0°F | Wt 165.9 lb

## 2010-11-12 DIAGNOSIS — N920 Excessive and frequent menstruation with regular cycle: Secondary | ICD-10-CM

## 2010-11-12 DIAGNOSIS — N921 Excessive and frequent menstruation with irregular cycle: Secondary | ICD-10-CM

## 2010-11-12 DIAGNOSIS — Z124 Encounter for screening for malignant neoplasm of cervix: Secondary | ICD-10-CM

## 2010-11-12 DIAGNOSIS — Z01419 Encounter for gynecological examination (general) (routine) without abnormal findings: Secondary | ICD-10-CM | POA: Insufficient documentation

## 2010-11-12 MED ORDER — IBUPROFEN 200 MG PO TABS
600.0000 mg | ORAL_TABLET | Freq: Once | ORAL | Status: AC
  Administered 2010-11-12: 600 mg via ORAL

## 2010-11-12 NOTE — Patient Instructions (Addendum)
Start Aleve (OTC) one tablet twice a day for next 14 days to see if this will stop the bleeding.  Once the biopsy results are back and if there is no evidence of cancer, will likely recommend two to three months of a hormonal therapy with estrogen to build back up the lining of the uterus.  Place endometrial biopsy patient instructions here.

## 2010-11-13 ENCOUNTER — Encounter: Payer: Self-pay | Admitting: Family Medicine

## 2010-11-13 DIAGNOSIS — Z8742 Personal history of other diseases of the female genital tract: Secondary | ICD-10-CM

## 2010-11-13 HISTORY — DX: Personal history of other diseases of the female genital tract: Z87.42

## 2010-11-13 LAB — CBC
HCT: 44.3 % (ref 36.0–46.0)
Hemoglobin: 14.6 g/dL (ref 12.0–15.0)
MCH: 31 pg (ref 26.0–34.0)
MCHC: 33 g/dL (ref 30.0–36.0)
MCV: 94.1 fL (ref 78.0–100.0)
RBC: 4.71 MIL/uL (ref 3.87–5.11)

## 2010-11-13 NOTE — Assessment & Plan Note (Addendum)
Await endometrial biopsy results results.  If unremarkable, then suspect that spotting and bleeding are secondary to atrophic endometrium with or without atrophic endometritis.  Will likely recommend an monophasic OCP with ethinyl estradiol 0.03mg  and levonorgesterel 0.15 mg daily, e.g. Portia or Nordette 28 for 3 months, possiblly longer, to rebuild endometrium with a scheduled sloughing of endometrium and stop the medroxyprogesterone.   If OCP helpful, could consider a 90 day cycle OCP, e.g. Camrese.   Gc/Chlamydia DNA probe 11/12/10 were negative.  Lab Results  Component Value Date   WBC 6.7 11/12/2010   HGB 14.6 11/12/2010   HCT 44.3 11/12/2010   MCV 94.1 11/12/2010   PLT 295 11/12/2010   Lab Results  Component Value Date   WBC 6.7 11/12/2010   HGB 14.6 11/12/2010   HCT 44.3 11/12/2010   MCV 94.1 11/12/2010   PLT 295 11/12/2010   Lab Results  Component Value Date   TSH 0.540 NORMAL 11/12/2010      Pt to return in 3 weeks to assess restarting psychotropic medications.

## 2010-11-13 NOTE — Progress Notes (Signed)
Subjective:    Patient ID: [name], female    DOB: [birth year], 31 y.o.   MRN: [id number]  HPI Polymenorrhea  Daily spotting to bleed similar to a menstrual bleed.  Dark clots occasionally.  Onset in July.  Did not improve with Depo-Provera 150 mg IM. Occasional cramping. Presumed mutually monogamous relations with husband. Normal Pap smear 7/09.  Hx of normal findings on coloposcopy 2008 for ASCUS/LGSIL?Marland Kitchen  Mother recently diagnosed with Endometrial Carcinoma which has her very worried about cancer for herself.  05/13/08 OV for polymenorrhea: [id number] 30 year old woman in a mutually monogamous relationship complaining of having daily vaginal bleeding since mid-December 2009. She has been using about 5 tampons a day.  The tampons are not soaked when she changes them.    Associated symptom: A new deep, aching,  constant pain in low LLQ since December 2009.  The pain can worsen with lifting heavy objects or straining to reach.    She is one year post-partum after NSVD of 34-week female.  Patient has been seen previously for complaint of polymenorrhea in July27, 20 09. She complained of break though bleeding twice a month since the birth of her son 04/2008. Each bleeding epidode lasting 2 to 3 days.  Each bleed required a half-box of tampons.  Her diagnostic work-up revealed normal TSH, normal prolactin, normal INR & PTT, normal GC/ Chlamydia DNA probe, negative urine prgegnancy, aan adequate and normal Pap smear.  The patient declined to go for Pelvic & TVUS.   Patient elected a trial of Depo-provera for both contraception and possible amenorrheic state.  She has had three cycles of Depo-provera.  Patient has been adherent to the injection every three months (10/09/2007, 01/02/2008, and 04/01/2008).  She was amenorrheic almost immediately until the start of this current daily bleeds starting in mid-December.  05/07/2008 TVUS Findings: The uterus has a normal size and echotexture.  The uterine  dimensions are 6.8 x 3.8 x 4.4 cm.  The endometrium is thin and homogeneous, measuring 8 mm in greatest width.  Both ovaries have a normal size and appearance.  The right ovary measures 2.7 x 1.8 x 2.2 cm, and the left ovary measures 2.8 x 1.6 x 2.8 cm.  No adnexal masses or free pelvic fluid are identified.  IMPRESSION: Normal pelvic ultrasound.  03/20/2009 OV for Polymenorrhea Urine pregancy negative.   Pt is S/P BTL.  Gave Depo-Provera 150 mg today for ongoing tx of her Heavy Menstrual Bleeding.  Her updated medication list for this problem includes:    Depo-provera 150 Mg/ml Im Susp (Medroxyprogesterone acetate) ..... 150mg  im injection every three months for contraception  Patients last Depo-Provera 150 mg injection was 09/21/10.   Review of Systems     Objective:   Physical Exam  Abdominal: Soft. Normal appearance and bowel sounds are normal. There is no tenderness.  Genitourinary: Vagina normal. Pelvic exam was performed with patient supine. There is no rash on the right labia. There is no rash on the left labia. Cervix exhibits no motion tenderness, no discharge and no friability.            Assessment & Plan:   Endometrial Biopsy Procedure Note  Pre-operative Diagnosis: metrorrhagia  Post-operative Diagnosis: normal  Indications: abnormal uterine bleeding  Procedure Details   Urine pregnancy test was done  and result was negative.  The risks (including infection, bleeding, pain, and uterine perforation) and benefits of the procedure were explained to the patient and Verbal informed consent was  obtained.  Antibiotic prophylaxis against endocarditis was not indicated.   The patient was placed in the dorsal lithotomy position A speculum inserted in the vagina, and the cervix prepped with povidone iodine.  Endocervical curettage with a Kevorkian curette was not performed.   A sharp tenaculum was applied to the anterior lip of the cervix for stabilization.  A Pipelle  endometrial aspirator was used to sample the endometrium.  Sample was sent for pathologic examination.  Pap smear of cervix was obtained just prior to endometrial biopsy.  Condition: Stable  Complications: None  Plan:  The patient was advised to call for any fever or for prolonged or severe pain or bleeding. She was advised to use NSAID and OTC ibuprofen as needed for mild to moderate pain. She was advised to avoid vaginal intercourse for 48 hours or until the bleeding has completely stopped.

## 2010-11-23 ENCOUNTER — Other Ambulatory Visit: Payer: Self-pay | Admitting: Family Medicine

## 2010-11-23 ENCOUNTER — Ambulatory Visit: Payer: Medicaid Other | Admitting: Family Medicine

## 2010-11-23 DIAGNOSIS — N921 Excessive and frequent menstruation with irregular cycle: Secondary | ICD-10-CM

## 2010-11-23 DIAGNOSIS — Z124 Encounter for screening for malignant neoplasm of cervix: Secondary | ICD-10-CM

## 2010-11-23 DIAGNOSIS — N920 Excessive and frequent menstruation with regular cycle: Secondary | ICD-10-CM

## 2010-11-24 ENCOUNTER — Telehealth: Payer: Self-pay | Admitting: Family Medicine

## 2010-11-24 ENCOUNTER — Encounter: Payer: Self-pay | Admitting: Family Medicine

## 2010-11-24 NOTE — Telephone Encounter (Signed)
I relayed reassuring results of cervical PAP, endometrial biopsy, and GC/Chlamydia DNA probe.  Will go over details of reports on patient OV in 2 weeks.

## 2010-12-03 ENCOUNTER — Ambulatory Visit (INDEPENDENT_AMBULATORY_CARE_PROVIDER_SITE_OTHER): Payer: Self-pay | Admitting: Family Medicine

## 2010-12-03 ENCOUNTER — Encounter: Payer: Self-pay | Admitting: Family Medicine

## 2010-12-03 DIAGNOSIS — F3160 Bipolar disorder, current episode mixed, unspecified: Secondary | ICD-10-CM

## 2010-12-03 DIAGNOSIS — N920 Excessive and frequent menstruation with regular cycle: Secondary | ICD-10-CM

## 2010-12-03 DIAGNOSIS — I10 Essential (primary) hypertension: Secondary | ICD-10-CM

## 2010-12-03 LAB — URINALYSIS, ROUTINE W REFLEX MICROSCOPIC
Glucose, UA: NEGATIVE
Hgb urine dipstick: NEGATIVE
Protein, ur: NEGATIVE
Specific Gravity, Urine: 1.015
Urobilinogen, UA: 0.2

## 2010-12-03 LAB — CBC
MCHC: 34.3
MCV: 92.7
Platelets: 189
RBC: 3.92

## 2010-12-03 LAB — POCT URINALYSIS DIP (DEVICE)
Bilirubin Urine: NEGATIVE
Glucose, UA: NEGATIVE
Hgb urine dipstick: NEGATIVE
Nitrite: NEGATIVE
Operator id: 120861
Specific Gravity, Urine: 1.015
Urobilinogen, UA: 1

## 2010-12-03 LAB — URINE MICROSCOPIC-ADD ON

## 2010-12-03 MED ORDER — HYDROCHLOROTHIAZIDE 25 MG PO TABS: 25.0000 mg | ORAL_TABLET | Freq: Every day | ORAL | Status: DC

## 2010-12-03 MED ORDER — RISPERIDONE 1 MG PO TABS: 1.0000 mg | ORAL_TABLET | Freq: Every day | ORAL | Status: DC

## 2010-12-03 MED ORDER — NORGESTIMATE-ETH ESTRADIOL 0.25-35 MG-MCG PO TABS: 1.0000 | ORAL_TABLET | Freq: Every day | ORAL | Status: DC

## 2010-12-03 MED ORDER — CITALOPRAM HYDROBROMIDE 20 MG PO TABS: 20.0000 mg | ORAL_TABLET | Freq: Every day | ORAL | Status: DC

## 2010-12-03 NOTE — Patient Instructions (Signed)
Stop Chlortalidone Start Hydrochlorothiazide to treat your high blood pressure.  Take one tablet a day.  Stop Zoloft Start Citalopram to treat your Bipolar disorder.  Take one tablet daily.  Stop Zyprexa Start Risperidone to treat your Bipolar disorder and to help with sleep.  Take one tablet at bedtime daily.   Start Sprintec hormonal therapy to treat your vaginal bleeding and improve the lining of your uterus.  Will likely stop the Depo-Provera if this Sprintec helps.

## 2010-12-04 LAB — CBC
HCT: 35.6 — ABNORMAL LOW
Hemoglobin: 12.2
Hemoglobin: 12.8
MCHC: 34
MCV: 92.7
MCV: 93
Platelets: 172
RBC: 4.04
RDW: 14.4
WBC: 10.2

## 2010-12-04 LAB — URINALYSIS, ROUTINE W REFLEX MICROSCOPIC
Glucose, UA: NEGATIVE
Ketones, ur: NEGATIVE
Nitrite: NEGATIVE
Protein, ur: NEGATIVE
Specific Gravity, Urine: 1.025
Urobilinogen, UA: 0.2
pH: 5.5

## 2010-12-04 LAB — POCT URINALYSIS DIP (DEVICE)
Glucose, UA: NEGATIVE
Glucose, UA: NEGATIVE
Nitrite: NEGATIVE
Nitrite: NEGATIVE
Operator id: 148111
Operator id: 159681
Protein, ur: 30 — AB
Protein, ur: NEGATIVE
Urobilinogen, UA: 1
Urobilinogen, UA: 1

## 2010-12-04 LAB — COMPREHENSIVE METABOLIC PANEL
ALT: 8
AST: 16
CO2: 23
Calcium: 9
GFR calc non Af Amer: >60

## 2010-12-04 LAB — URINE MICROSCOPIC-ADD ON

## 2010-12-04 LAB — RH IMMUNE GLOB WKUP(>/=20WKS)(NOT WOMEN'S HOSP): Fetal Screen: NEGATIVE

## 2010-12-04 NOTE — Assessment & Plan Note (Signed)
Patient unable to afford the Zyprexa or Zoloft secondary to cost.  Will start lower cost neuroleptic risperidal 1 mg by mouth at night, may increase to twice a day in future.  Will change to Celexa 20 mg daily which she can get $4 plan at [facility].  Recommend that pt start counseling thru [facility].

## 2010-12-04 NOTE — Assessment & Plan Note (Addendum)
Endometrial biopsy (11/12/10) showed inactive endometrium - no hyperplasia or carcinoma.  Plan: Promote endometrium recovery with Sprintec monophasic OCP. Trial for 3 months.  If successful and tolerated witll stop Depoprovera and continue with a Sprintec or a  could consider a 90 day cycle OCP, e.g. Camrese.

## 2010-12-04 NOTE — Progress Notes (Signed)
  Subjective:    Patient ID: [name], female    DOB: 1981-02-04, 30 y.o.   MRN: [id number]  HPI Polymenorrhea Has not had bleed or spotting.  Is having some cramping.    Bipolar Disorder  Not taking her Zyprexa or Zoloft Cannot afford.  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 Having trouble with both falling asleep and maintaining sleep.  Easily irritated and angry, especially at work.  Fears losing her job because of her irritability. Conitnued strained relations with her husband.  Children are doing well and not causing irriation for her.  No thoughts of harming herself or others.   No alcohol intake.  Denies illicit substance use.  Feels like her thoughts are racing and easily distractable.   Stressed by application process for her dgt, [name] recently lost Disability assignment.   Is interested in restarting her pyschiatric medications.   CHRONIC HYPERTENSION  Disease Monitoring  Blood pressure range: not measuring at homje  Chest pain: no   Dyspnea: no   Claudication: no   Medication compliance: no, could not afford chlorthalidone.   Medication Side Effects  Lightheadedness: no   Urinary frequency: no   Edema: no   Preventitive Healthcare:  Exercise: no, no formal exercise   Diet Pattern: high fat and sugar content.   Salt Restriction: watches sodium in diet.         Review of Systems     Objective:   Physical Exam        Assessment & Plan:

## 2010-12-04 NOTE — Assessment & Plan Note (Signed)
Inadequate DBP control likely secondary to noncompliance with medication secondary to cost.  Plan: Stop Chlorthalidone and start HCTZ 25 mg daily which she can get on $4 plans at [facility] pharmacy.

## 2010-12-16 LAB — DIFFERENTIAL
Basophils Absolute: 0.1
Eosinophils Absolute: 0.1
Lymphocytes Relative: 60 — ABNORMAL HIGH
Lymphs Abs: 3.9
Neutrophils Relative %: 33 — ABNORMAL LOW

## 2010-12-16 LAB — CARBAMAZEPINE LEVEL, TOTAL: Carbamazepine Lvl: 2.7 — ABNORMAL LOW

## 2010-12-16 LAB — RAPID URINE DRUG SCREEN, HOSP PERFORMED
Amphetamines: NOT DETECTED
Benzodiazepines: NOT DETECTED

## 2010-12-16 LAB — ETHANOL: Alcohol, Ethyl (B): <5

## 2010-12-16 LAB — PREGNANCY, URINE: Preg Test, Ur: NEGATIVE

## 2010-12-16 LAB — BASIC METABOLIC PANEL
BUN: 6
Creatinine, Ser: 0.75
GFR calc non Af Amer: >60

## 2010-12-16 LAB — CBC
MCV: 90.5
Platelets: 196
WBC: 6.5

## 2010-12-18 LAB — POCT URINALYSIS DIP (DEVICE)
Ketones, ur: NEGATIVE
Nitrite: NEGATIVE
Nitrite: NEGATIVE
Nitrite: NEGATIVE
Operator id: 120861
Operator id: 194561
Operator id: 297281
Protein, ur: 30 — AB
Protein, ur: NEGATIVE
Protein, ur: NEGATIVE
Urobilinogen, UA: 1
Urobilinogen, UA: 1

## 2010-12-21 ENCOUNTER — Telehealth: Payer: Self-pay | Admitting: Family Medicine

## 2010-12-21 LAB — DIFFERENTIAL
Basophils Absolute: 0
Basophils Relative: 0
Eosinophils Absolute: 0.1 — ABNORMAL LOW
Eosinophils Relative: 0
Eosinophils Relative: 1
Lymphocytes Relative: 6 — ABNORMAL LOW
Lymphs Abs: 0.6 — ABNORMAL LOW
Monocytes Absolute: 0.1
Monocytes Absolute: 0.3
Monocytes Relative: 1 — ABNORMAL LOW
Monocytes Relative: 3
Neutro Abs: 9.2 — ABNORMAL HIGH

## 2010-12-21 LAB — URINALYSIS, ROUTINE W REFLEX MICROSCOPIC
Glucose, UA: NEGATIVE
Glucose, UA: NEGATIVE
Hgb urine dipstick: NEGATIVE
Ketones, ur: 15 — AB
Protein, ur: NEGATIVE
Specific Gravity, Urine: 1.025
pH: 6
pH: 7

## 2010-12-21 LAB — POCT URINALYSIS DIP (DEVICE)
Operator id: 297281
Protein, ur: 30 — AB
Urobilinogen, UA: 1
pH: 6.5

## 2010-12-21 LAB — CBC
HCT: 31.8 — ABNORMAL LOW
HCT: 33 — ABNORMAL LOW
Hemoglobin: 11 — ABNORMAL LOW
Hemoglobin: 11.4 — ABNORMAL LOW
Hemoglobin: 11.5 — ABNORMAL LOW
MCHC: 34.5
MCHC: 34.6
MCV: 91.1
MCV: 91.7
MCV: 91.7
Platelets: 178
RBC: 3.47 — ABNORMAL LOW
RBC: 3.62 — ABNORMAL LOW
RDW: 14.3
WBC: 10.1
WBC: 12.5 — ABNORMAL HIGH

## 2010-12-21 LAB — RH IMMUNE GLOBULIN WORKUP (NOT WOMEN'S HOSP)
ABO/RH(D): O NEG
Antibody Screen: NEGATIVE

## 2010-12-21 LAB — WET PREP, GENITAL: Yeast Wet Prep HPF POC: NONE SEEN

## 2010-12-21 NOTE — Telephone Encounter (Signed)
Ms. Eckerson need an rx for uri.  Would like you to call her back regarding this.

## 2010-12-22 LAB — POCT URINALYSIS DIP (DEVICE)
Ketones, ur: >=160 — AB
Specific Gravity, Urine: 1.025
Urobilinogen, UA: 1

## 2010-12-22 LAB — STREP B DNA PROBE

## 2010-12-22 LAB — WET PREP, GENITAL: Yeast Wet Prep HPF POC: NONE SEEN

## 2010-12-23 LAB — URINALYSIS, ROUTINE W REFLEX MICROSCOPIC
Glucose, UA: 100 — AB
Hgb urine dipstick: NEGATIVE
Specific Gravity, Urine: 1.02
pH: 7

## 2010-12-23 LAB — STREP B DNA PROBE: Strep Group B Ag: NEGATIVE

## 2010-12-23 LAB — GC/CHLAMYDIA PROBE AMP, GENITAL: GC Probe Amp, Genital: NEGATIVE

## 2010-12-23 LAB — URINE MICROSCOPIC-ADD ON

## 2010-12-23 LAB — WET PREP, GENITAL: Trich, Wet Prep: NONE SEEN

## 2010-12-23 LAB — ABO/RH: ABO/RH(D): O NEG

## 2010-12-23 LAB — CBC
Platelets: 206
RDW: 13.9

## 2010-12-28 LAB — CBC
HCT: 37.3
Hemoglobin: 12.4
Hemoglobin: 12.6
RBC: 4.05
RBC: 4.26
RDW: 13
WBC: 7.7

## 2010-12-28 LAB — COMPREHENSIVE METABOLIC PANEL
ALT: 9
AST: 15
Alkaline Phosphatase: 37 — ABNORMAL LOW
CO2: 25
GFR calc Af Amer: >60
GFR calc non Af Amer: >60
Glucose, Bld: 81
Potassium: 3.8
Sodium: 138
Total Protein: 5.9 — ABNORMAL LOW

## 2010-12-28 LAB — GC/CHLAMYDIA PROBE AMP, GENITAL
Chlamydia, DNA Probe: NEGATIVE
GC Probe Amp, Genital: NEGATIVE

## 2010-12-28 LAB — URINALYSIS, ROUTINE W REFLEX MICROSCOPIC
Bilirubin Urine: NEGATIVE
Glucose, UA: NEGATIVE
Glucose, UA: NEGATIVE
Hgb urine dipstick: NEGATIVE
Ketones, ur: 15 — AB
Ketones, ur: NEGATIVE
Protein, ur: NEGATIVE
Protein, ur: NEGATIVE
Urobilinogen, UA: 0.2
pH: 8

## 2010-12-28 LAB — WET PREP, GENITAL: Trich, Wet Prep: NONE SEEN

## 2010-12-28 LAB — DIFFERENTIAL
Basophils Relative: 1
Eosinophils Absolute: 0
Eosinophils Relative: 0
Monocytes Relative: 5
Neutrophils Relative %: 68

## 2010-12-28 LAB — POCT PREGNANCY, URINE
Operator id: 220991
Preg Test, Ur: POSITIVE

## 2010-12-28 LAB — RPR: RPR Ser Ql: NONREACTIVE

## 2011-01-21 ENCOUNTER — Ambulatory Visit: Payer: Self-pay | Admitting: Family Medicine

## 2011-03-30 ENCOUNTER — Telehealth: Payer: Self-pay | Admitting: Family Medicine

## 2011-03-30 ENCOUNTER — Emergency Department (HOSPITAL_COMMUNITY)
Admission: EM | Admit: 2011-03-30 | Discharge: 2011-03-30 | Disposition: A | Discharge status: Home / Self Care | Payer: Medicaid Other | Attending: Emergency Medicine | Admitting: Emergency Medicine

## 2011-03-30 ENCOUNTER — Encounter (HOSPITAL_COMMUNITY): Payer: Self-pay | Admitting: *Deleted

## 2011-03-30 DIAGNOSIS — Z79899 Other long term (current) drug therapy: Secondary | ICD-10-CM | POA: Insufficient documentation

## 2011-03-30 DIAGNOSIS — K2970 Gastritis, unspecified, without bleeding: Secondary | ICD-10-CM

## 2011-03-30 DIAGNOSIS — R1013 Epigastric pain: Secondary | ICD-10-CM | POA: Insufficient documentation

## 2011-03-30 DIAGNOSIS — R1110 Vomiting, unspecified: Secondary | ICD-10-CM | POA: Insufficient documentation

## 2011-03-30 DIAGNOSIS — I10 Essential (primary) hypertension: Secondary | ICD-10-CM | POA: Insufficient documentation

## 2011-03-30 DIAGNOSIS — K219 Gastro-esophageal reflux disease without esophagitis: Secondary | ICD-10-CM | POA: Insufficient documentation

## 2011-03-30 DIAGNOSIS — Z823 Family history of stroke: Secondary | ICD-10-CM | POA: Insufficient documentation

## 2011-03-30 DIAGNOSIS — R195 Other fecal abnormalities: Secondary | ICD-10-CM | POA: Insufficient documentation

## 2011-03-30 LAB — CBC
MCH: 32.1 pg (ref 26.0–34.0)
Platelets: 224 10*3/uL (ref 150–400)
RBC: 4.61 MIL/uL (ref 3.87–5.11)
WBC: 5.9 10*3/uL (ref 4.0–10.5)

## 2011-03-30 LAB — URINE MICROSCOPIC-ADD ON

## 2011-03-30 LAB — COMPREHENSIVE METABOLIC PANEL
BUN: 8 mg/dL (ref 6–23)
Calcium: 9.7 mg/dL (ref 8.4–10.5)
Creatinine, Ser: 0.71 mg/dL (ref 0.50–1.10)
GFR calc Af Amer: >90 mL/min (ref >90)
GFR calc non Af Amer: >90 mL/min (ref >90)
Potassium: 3.2 mEq/L — ABNORMAL LOW (ref 3.5–5.1)
Sodium: 138 mEq/L (ref 135–145)
Total Bilirubin: 0.9 mg/dL (ref 0.3–1.2)

## 2011-03-30 LAB — URINALYSIS, ROUTINE W REFLEX MICROSCOPIC
Bilirubin Urine: NEGATIVE
Ketones, ur: 15 mg/dL — AB
Nitrite: NEGATIVE
Protein, ur: NEGATIVE mg/dL
Urobilinogen, UA: 1 mg/dL (ref 0.0–1.0)

## 2011-03-30 LAB — DIFFERENTIAL
Eosinophils Absolute: 0 10*3/uL (ref 0.0–0.7)
Lymphs Abs: 2.8 10*3/uL (ref 0.7–4.0)
Neutrophils Relative %: 44 % (ref 43–77)

## 2011-03-30 LAB — LIPASE, BLOOD: Lipase: 15 U/L (ref 11–59)

## 2011-03-30 LAB — PREGNANCY, URINE: Preg Test, Ur: NEGATIVE

## 2011-03-30 MED ORDER — ONDANSETRON HCL 4 MG/2ML IJ SOLN
4.0000 mg | Freq: Once | INTRAMUSCULAR | Status: AC
  Administered 2011-03-30: 4 mg via INTRAVENOUS
  Filled 2011-03-30: qty 2

## 2011-03-30 MED ORDER — ONDANSETRON 8 MG PO TBDP: 8.0000 mg | ORAL_TABLET | Freq: Three times a day (TID) | ORAL | Status: AC | PRN

## 2011-03-30 MED ORDER — GI COCKTAIL ~~LOC~~
30.0000 mL | Freq: Once | ORAL | Status: AC
  Administered 2011-03-30: 30 mL via ORAL
  Filled 2011-03-30: qty 30

## 2011-03-30 MED ORDER — PANTOPRAZOLE SODIUM 40 MG IV SOLR
INTRAVENOUS | Status: AC
  Filled 2011-03-30: qty 80

## 2011-03-30 MED ORDER — POTASSIUM CHLORIDE CRYS ER 20 MEQ PO TBCR
40.0000 meq | EXTENDED_RELEASE_TABLET | Freq: Once | ORAL | Status: AC
  Administered 2011-03-30: 40 meq via ORAL
  Filled 2011-03-30: qty 2

## 2011-03-30 MED ORDER — SODIUM CHLORIDE 0.9 % IV SOLN
80.0000 mg | INTRAVENOUS | Status: AC
  Administered 2011-03-30: 80 mg via INTRAVENOUS
  Filled 2011-03-30: qty 80

## 2011-03-30 MED ORDER — SODIUM CHLORIDE 0.9 % IV BOLUS (SEPSIS)
1000.0000 mL | Freq: Once | INTRAVENOUS | Status: AC
  Administered 2011-03-30: 1000 mL via INTRAVENOUS

## 2011-03-30 MED ORDER — HYDROMORPHONE HCL PF 1 MG/ML IJ SOLN
1.0000 mg | Freq: Once | INTRAMUSCULAR | Status: AC
  Administered 2011-03-30: 1 mg via INTRAVENOUS
  Filled 2011-03-30: qty 1
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
## 2011-03-30 NOTE — ED Notes (Signed)
Pt discharged home. Had no further questions. 

## 2011-03-30 NOTE — ED Notes (Signed)
Pt is here with complaints of vomiting blood at 0200 and yesterday had blood in stools.  Pt reports that she has 2 peptic ulcers

## 2011-03-30 NOTE — Telephone Encounter (Signed)
Dx with peptic ulcers 3-4 years ago.  Yesterday, felt bloated, ran to bathroom to have BM, and saw fragments of blood in stool.  Last night, started having abdominal pain, nausea and vomiting.  She threw up one time and saw fragments of blood.  She took Omeprazole but threw it up.  Patient is scared and wants to know if she should go to [facility] or ED.  Advised patient to go to ED for evaluation since she continues to vomit and may need imaging done.   Patient agreed with plan and will go to ED today.

## 2011-03-30 NOTE — ED Notes (Signed)
Patient resting quietly, calm with unlabored respirations.  

## 2011-04-05 ENCOUNTER — Ambulatory Visit: Payer: Medicaid Other | Admitting: Family Medicine

## 2011-04-08 ENCOUNTER — Encounter: Payer: Self-pay | Admitting: Family Medicine

## 2011-04-08 ENCOUNTER — Ambulatory Visit (INDEPENDENT_AMBULATORY_CARE_PROVIDER_SITE_OTHER): Payer: Medicaid Other | Admitting: Family Medicine

## 2011-04-08 VITALS — BP 148/104 | HR 71 | Temp 97.8°F | Ht 65.0 in | Wt 166.0 lb

## 2011-04-08 DIAGNOSIS — R1013 Epigastric pain: Secondary | ICD-10-CM | POA: Insufficient documentation

## 2011-04-08 MED ORDER — HYOSCYAMINE SULFATE CR 0.375 MG PO CP12: 0.3750 mg | ORAL_CAPSULE | Freq: Two times a day (BID) | ORAL | Status: DC | PRN
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
## 2011-04-08 NOTE — Assessment & Plan Note (Addendum)
Chronic nausea and epigastric discomfort with acute worsening with possible gastritis.  Patient had similar episode in 2008 which resolved spontaneously. EGD and colonoscopy at that time was unremarkable and Dr Leone Payor thought that a Functional GI disorder may be the explanation of the patient's discomfort.  Given the () Carnett sign there may be a component of abdominal wall pain.   Will check [place] to look for biliary source of postprandial pain. Check CMET/lipase/CBC/ H. Pylori titer..  Continue High dose Protonix and add Levsinex BID for possible functional bowel component and as adjuvant in PUD.  Asked patient to restart her Celexa 40 mg daily and Resperidal 1 mg qhs to help with sleep. RTC 2 weeks.  Pt to contact [facility] about reestablishing care there.

## 2011-04-08 NOTE — Patient Instructions (Addendum)
Go for Ultrasound of your abdomen to make sure this is not from gallstones.  Take Levsinex tablets twice a day if stomach starts aching. Continue taking your omeprazole twice a day.  Start back on your Celexa and Respiradone

## 2011-04-09 ENCOUNTER — Other Ambulatory Visit (HOSPITAL_COMMUNITY): Payer: Medicaid Other

## 2011-04-09 LAB — COMPREHENSIVE METABOLIC PANEL
ALT: 12 U/L (ref 0–35)
Albumin: 4.6 g/dL (ref 3.5–5.2)
CO2: 25 mEq/L (ref 19–32)
Chloride: 103 mEq/L (ref 96–112)
Potassium: 4 mEq/L (ref 3.5–5.3)
Sodium: 140 mEq/L (ref 135–145)
Total Bilirubin: 0.4 mg/dL (ref 0.3–1.2)
Total Protein: 7 g/dL (ref 6.0–8.3)

## 2011-04-09 LAB — CBC
MCV: 93.8 fL (ref 78.0–100.0)
Platelets: 331 10*3/uL (ref 150–400)
RDW: 15.2 % (ref 11.5–15.5)
WBC: 9.3 10*3/uL (ref 4.0–10.5)

## 2011-04-09 LAB — LIPASE: Lipase: <10 U/L (ref 0–75)

## 2011-04-12 ENCOUNTER — Telehealth: Payer: Self-pay | Admitting: Family Medicine

## 2011-04-12 ENCOUNTER — Ambulatory Visit (HOSPITAL_COMMUNITY)
Admission: RE | Admit: 2011-04-12 | Discharge: 2011-04-12 | Disposition: A | Discharge status: Home / Self Care | Payer: Medicaid Other | Source: Ambulatory Visit | Attending: Family Medicine | Admitting: Family Medicine

## 2011-04-12 DIAGNOSIS — R1013 Epigastric pain: Secondary | ICD-10-CM

## 2011-04-12 MED ORDER — AMITRIPTYLINE HCL 10 MG PO TABS: ORAL_TABLET | ORAL | Status: DC

## 2011-04-12 NOTE — Telephone Encounter (Signed)
Call in a different rx for patien'ts stomach because medicaid does not cover the one given any more.

## 2011-04-12 NOTE — Telephone Encounter (Signed)
Needs to get a copy of the Power Attorney for his children.

## 2011-04-12 NOTE — Telephone Encounter (Signed)
I spoke with Ms Stoffers.  Medicaid will not pay for her hyoscyamine.  She cannot afford to purchase it.   I cannot find dicyclomine on the Preferred Medication List for Medicaid. Will try trial of Amitriptiline 10 mg tab, 1 to 2 tablets at nite. Patient reports picking up her Celexa and Resperidal from Pharmacy, but has not yet started it.  She is taking Coricidin BP for cold. I instructed patient she may start the Amitriptiline if she is not being excessively sedated by the Resperidal.   If not improved by Amitriptiline, patient to let Dr Tiondra Fang know.  May need to contact Medicaid pharmacist via Dr Raymondo Band to see what my options are.

## 2011-04-12 NOTE — Telephone Encounter (Signed)
LVM for [name] about her request for the copy of the consent for her mom to bring her children.  It expired in March of 2012, so she is going to need an updated copy.

## 2011-04-22 ENCOUNTER — Ambulatory Visit: Payer: Medicaid Other | Admitting: Family Medicine

## 2011-04-26 ENCOUNTER — Ambulatory Visit (INDEPENDENT_AMBULATORY_CARE_PROVIDER_SITE_OTHER): Payer: Self-pay | Admitting: Family Medicine

## 2011-04-26 ENCOUNTER — Encounter: Payer: Self-pay | Admitting: Family Medicine

## 2011-04-26 DIAGNOSIS — F411 Generalized anxiety disorder: Secondary | ICD-10-CM

## 2011-04-26 DIAGNOSIS — F3160 Bipolar disorder, current episode mixed, unspecified: Secondary | ICD-10-CM

## 2011-04-26 DIAGNOSIS — R110 Nausea: Secondary | ICD-10-CM

## 2011-04-26 NOTE — Patient Instructions (Addendum)
Continue taking the Amitriptyline, Resperidone at bedtime. Continue taking the Celexa daily.  Contact [facility] [place] to discuss coming in for Parenting Classes for adolescent parenting.

## 2011-04-27 ENCOUNTER — Encounter: Payer: Self-pay | Admitting: Family Medicine

## 2011-04-27 DIAGNOSIS — F411 Generalized anxiety disorder: Secondary | ICD-10-CM | POA: Insufficient documentation

## 2011-04-27 DIAGNOSIS — R110 Nausea: Secondary | ICD-10-CM | POA: Insufficient documentation

## 2011-04-27 HISTORY — DX: Generalized anxiety disorder: F41.1

## 2011-04-27 MED ORDER — RISPERIDONE 1 MG PO TABS: 1.0000 mg | ORAL_TABLET | Freq: Every day | ORAL | Status: DC

## 2011-04-27 MED ORDER — CITALOPRAM HYDROBROMIDE 20 MG PO TABS: 20.0000 mg | ORAL_TABLET | Freq: Every day | ORAL | Status: DC

## 2011-04-27 MED ORDER — AMITRIPTYLINE HCL 10 MG PO TABS: ORAL_TABLET | ORAL | Status: DC

## 2011-04-27 NOTE — Assessment & Plan Note (Addendum)
Wt Readings from Last 3 Encounters:  04/26/11 165 lb (74.844 kg)  04/08/11 166 lb (75.297 kg)  12/03/10 173 lb (78.472 kg)  Working explanation of intermittent prolonged episodes of nausea/ epigastric pain/ with or without vomiting is a Functional Gastrointestinal Disorder.  As we were unable to obtain dicyclomine or hyoscyamine thru patient's Medicaid drug coverage, we started a trial of Amitriptyline 10 to 20 mg at bedtime.  The Amitriptyline is either helping with the symptoms or the patient's natural history has lead to a period of spontaneous remission.   [name] expressed a great deal of stress from relationaships with her husband, her eldest daughter, and her husband's family. She feels fatigue, irritability and difficulty with concentrating on how to cope with the relationships, along with sense of helplessness in making a change in her relationships. She is sleeping adequately and eating adequately. She reports running out of her Resperidal and Celexa recently, and that she is willing to continue them.  She believes the Amitriptyline is helping and would like to continue taking it.  She denies thoughts of harming herself or others.  She said she would contact [facility] to re-establish a relationship with them .   Consider use of Motivation interviewing next OV in one month to see if it will help [name] develop her own plan for change and coping with her family situation.

## 2011-04-30 NOTE — Progress Notes (Signed)
  Subjective:    Patient ID: [name], female    DOB: [birth year], 31 y.o.   MRN: [id number]  HPI See Chronic idiopathic nausea problem in problem list   Review of Systems     Objective:   Physical Exam        Assessment & Plan:

## 2011-05-31 ENCOUNTER — Ambulatory Visit (INDEPENDENT_AMBULATORY_CARE_PROVIDER_SITE_OTHER): Payer: Self-pay | Admitting: Family Medicine

## 2011-05-31 ENCOUNTER — Ambulatory Visit: Payer: Self-pay | Admitting: Family Medicine

## 2011-05-31 ENCOUNTER — Encounter: Payer: Self-pay | Admitting: Family Medicine

## 2011-05-31 VITALS — BP 131/81 | HR 78 | Temp 98.3°F | Ht 66.0 in | Wt 169.0 lb

## 2011-05-31 DIAGNOSIS — N63 Unspecified lump in unspecified breast: Secondary | ICD-10-CM

## 2011-05-31 DIAGNOSIS — N644 Mastodynia: Secondary | ICD-10-CM

## 2011-05-31 NOTE — Patient Instructions (Addendum)
Ok to take tylenol, use ice packs or warm compresses for breast pain  We discussed risks and you opted to wait until your medicaid is approved before scheduling mammogram  Please keep in touch and let us know if no improvement or worsening

## 2011-05-31 NOTE — Assessment & Plan Note (Signed)
Breast pain with palpable lump.   No evidence of mastitis or abscess on exam.  Scheduled diagnostic mammogram and [place] but patient would prefer to wait as she anticipatess medicaid approval in 2-3 weeks.  Advised to watch for signs of worsening or infection and to contact us. Will treat symptomatically with tylenol, warm compresses.

## 2011-05-31 NOTE — Progress Notes (Signed)
  Subjective:    Patient ID: [name], female    DOB: Oct 26, 1980, 31 y.o.   MRN: [id number]  HPI Right breast pain  3 months of right breast pain and a lump.  Non-cyclical, constant, has not changed.  Has tried warm compresses which help.  Cannot take NSAIDS due to ulcer.  No skin changes, nipple changes or nipple discharge.  No redness or swelling.   Has history of lactation clogged milk duct 4 years ago.  No history of previous mammogram or biopsy.  Mother is currently having a lumpectomy for benign breast disease.  I have reviewed patient's  PMH, FH, and Social history and Medications as related to this visit.  Review of Systemssee HPI     Objective:   Physical Exam GEN: NAD Right Breast:  Symmetrical on inspection.  No skin erythema or changes.  No nipple changes.  Tender to palpation on lateral right breast.  Small < 1cm tender mobile nodule 9 o clock position approx 5-7cm from areola.  No axillary nodes       Assessment & Plan:

## 2011-06-07 ENCOUNTER — Ambulatory Visit (INDEPENDENT_AMBULATORY_CARE_PROVIDER_SITE_OTHER): Payer: Self-pay | Admitting: Family Medicine

## 2011-06-07 ENCOUNTER — Encounter: Payer: Self-pay | Admitting: Family Medicine

## 2011-06-07 VITALS — BP 136/89 | HR 72 | Temp 98.2°F | Ht 66.0 in | Wt 167.0 lb

## 2011-06-07 DIAGNOSIS — F411 Generalized anxiety disorder: Secondary | ICD-10-CM

## 2011-06-07 DIAGNOSIS — R110 Nausea: Secondary | ICD-10-CM

## 2011-06-07 DIAGNOSIS — J45909 Unspecified asthma, uncomplicated: Secondary | ICD-10-CM

## 2011-06-07 DIAGNOSIS — F1010 Alcohol abuse, uncomplicated: Secondary | ICD-10-CM

## 2011-06-07 DIAGNOSIS — F17200 Nicotine dependence, unspecified, uncomplicated: Secondary | ICD-10-CM

## 2011-06-07 DIAGNOSIS — I10 Essential (primary) hypertension: Secondary | ICD-10-CM

## 2011-06-07 DIAGNOSIS — F3160 Bipolar disorder, current episode mixed, unspecified: Secondary | ICD-10-CM

## 2011-06-07 DIAGNOSIS — K219 Gastro-esophageal reflux disease without esophagitis: Secondary | ICD-10-CM

## 2011-06-07 DIAGNOSIS — N644 Mastodynia: Secondary | ICD-10-CM

## 2011-06-07 MED ORDER — ALBUTEROL SULFATE HFA 108 (90 BASE) MCG/ACT IN AERS: 2.0000 | INHALATION_SPRAY | RESPIRATORY_TRACT | Status: DC | PRN

## 2011-06-07 MED ORDER — OMEPRAZOLE 40 MG PO CPDR: 40.0000 mg | DELAYED_RELEASE_CAPSULE | Freq: Every day | ORAL | Status: DC

## 2011-06-07 NOTE — Patient Instructions (Addendum)
The [place] state tobacco cessation program can be accessed at 1-800-QUITNOW.  They can help with getting nicotene gum and support while you are quitting smoking.   [name]!  Restart your Omeprazole and Celexa.   Once you have your [employer] [facility] network thru [place]) let Dr Naydelin Ziegler know so he can arrange your Diagnostic Mammogram.

## 2011-06-08 ENCOUNTER — Encounter: Payer: Self-pay | Admitting: Family Medicine

## 2011-06-08 NOTE — Assessment & Plan Note (Signed)
[name] is actively quitting.  She is smoking approximately 3 mg nicotene daily. She identifies her ability to stop smoking as good and as very important to her.  She is interested in contacting the [place] smoking cessation program. Contact information given.

## 2011-06-08 NOTE — Assessment & Plan Note (Signed)
Denies drinking.

## 2011-06-08 NOTE — Assessment & Plan Note (Signed)
Adequate blood pressure control.  No evidence of new end organ damage.  Tolerating medication without significant adverse effects.  Plan to continue current blood pressure regiment.   

## 2011-06-08 NOTE — Assessment & Plan Note (Signed)
While I did not appreciate a palpable lump, the patient is tender to palpation of lateral right breast.  Pt is applying for [facility] Card), and once enrolled in the program, will schedule a diagnostic mammogram/US.

## 2011-06-08 NOTE — Assessment & Plan Note (Signed)
No worsening of symptoms.  Patient has not contacted [facility] for couseling. Patient not taking mood stabilization medications.

## 2011-06-08 NOTE — Assessment & Plan Note (Addendum)
Using low-dose Amitriptyline with good symptom control. Ran out of her PPI yesterday.  She want to continue taking PPI but she is uncertain is she can afford it. If she gets her [facility] enrollment then she could get her omeprazole thru the [facility] health Department formulary.  She has refills of her omeprazole available thru her community pharmacy.

## 2011-06-08 NOTE — Progress Notes (Signed)
  Subjective:    Patient ID: [name], female    DOB: [birth year], 30 y.o.   MRN: [id number]  HPI Breast Pain, right Still with breast pain on right Onset: 2 to 3 months ago Location: right lateral breast Quality: tender, hurts when compressed by brassiere cup foundation Severity: mild to moderate Function: no impairment in function other than provokes feelings of anxiety about its possible meaning Pattern: not cyclic Course: unchanged Radiation: none Relief: removal of brassiere Precipitant: Brassiere cup pressing against breast Associated Symptoms: No nipple discharge Trauma (Acute or Chronic): no recall of trauma Prior Diagnostic Testing or Treatments: Referral for diagnostic mammogram/US  Relevant PMH/PSH: I recall that Pt had diagnostic mammogram/US many years ago for mastalgia.  I believe the records were in her paper chart and I believe that the findings were benign.   CHRONIC HYPERTENSION  Disease Monitoring  Blood pressure range: not checking BP at home  Chest pain: no   Dyspnea: no   Claudication: no   Medication compliance: yes  Medication Side Effects  Lightheadedness: no   Urinary frequency: no   Edema: no  Preventitive Healthcare:  Exercise: no   Diet Pattern: healthy  Salt Restriction: avoids shaken salt    Patient has not obtained Medicaid.  She is in process of obtaining [facility] enrollment.   Tobacco Dependence Quit smoking yesterday. Smoking 3 cigarettes daily, full-flavor 100 mm [place] Denies drinking alcohol currently    Review of Systems See HPI     Objective:   Physical Exam  Constitutional: Vital signs are normal. She is cooperative. No distress.  Genitourinary:       Chaperoned breast exam ( Asha, CNA) Bilaterally symmetric breasts No overlying erythema No breast edema, no nipple drainage Tender right lateral breast. No palpable mass or fullness. No axillary or supraclavicular lymphadenopathy.  Neurological:  She is alert.  Psychiatric: Her behavior is normal. Thought content normal. Her mood appears anxious. Her speech is not rapid and/or pressured, not delayed, not tangential and not slurred. She is not agitated and not slowed. Cognition and memory are normal. Cognition and memory are not impaired. She is attentive.          Assessment & Plan:

## 2011-06-08 NOTE — Assessment & Plan Note (Signed)
Stable.  Not needing to use as needed albuterol in months.

## 2011-06-08 NOTE — Assessment & Plan Note (Addendum)
No worsening of symptoms. Not taking Celexa because of cost per patient.  Reminded patient of $4 plans.

## 2011-06-24 ENCOUNTER — Encounter: Payer: Self-pay | Admitting: Family Medicine

## 2011-06-24 ENCOUNTER — Ambulatory Visit (INDEPENDENT_AMBULATORY_CARE_PROVIDER_SITE_OTHER): Payer: Self-pay | Admitting: Family Medicine

## 2011-06-24 VITALS — BP 132/93 | HR 80 | Temp 98.4°F | Ht 66.0 in | Wt 160.0 lb

## 2011-06-24 DIAGNOSIS — F411 Generalized anxiety disorder: Secondary | ICD-10-CM

## 2011-06-24 DIAGNOSIS — F3160 Bipolar disorder, current episode mixed, unspecified: Secondary | ICD-10-CM

## 2011-06-24 DIAGNOSIS — J209 Acute bronchitis, unspecified: Secondary | ICD-10-CM

## 2011-06-24 MED ORDER — CITALOPRAM HYDROBROMIDE 20 MG PO TABS: 20.0000 mg | ORAL_TABLET | Freq: Every day | ORAL | Status: DC

## 2011-06-24 MED ORDER — BENZONATATE 200 MG PO CAPS: 200.0000 mg | ORAL_CAPSULE | Freq: Three times a day (TID) | ORAL | Status: AC | PRN

## 2011-06-24 MED ORDER — CHLORPHENIRAMINE-PSEUDOEPH 8-120 MG PO CPCR: 1.0000 | ORAL_CAPSULE | Freq: Two times a day (BID) | ORAL | Status: AC

## 2011-06-24 NOTE — Assessment & Plan Note (Signed)
Acute Bronchitis resolving with post-infectious cough.  Plan: Deconamine SR TWICE DAILY for one week Tessalon pearls THREE TIMES DAILY as needed cough.

## 2011-06-24 NOTE — Patient Instructions (Addendum)
I believe you have a viral bronchitis causing your cough. Start Deconamine SR (Antihistamine Chlorpheniramine/Decondestant-Pseudoephedrine) which will help with your cough and congestion. Start Tessalon Pearls for the cough Drink plenty of fluids.  Let Dr Russell Engelstad know if your cough does not improve over next 3 weeks, or if it worsens.

## 2011-06-24 NOTE — Progress Notes (Signed)
  Subjective:    Patient ID: [name], female    DOB: Aug 20, 1980, 31 y.o.   MRN: [id number]  HPIx    Review of Systems     Objective:   Physical Exam        Assessment & Plan:   Subjective:     [name] is a 31 y.o. female here for evaluation of a cough. Onset of symptoms was 2 weeks ago after acute onset of sore throat, nasal congestion. Symptoms have been gradually worsening since that time. The cough is dry and is aggravated by reclining position. Associated symptoms include: postnasal drip. Patient does have a history of asthma. Patient does not have a history of environmental allergens. Patient has not traveled recently. Patient does have a history of smoking. Quit smoking 3 weeks ago Patient has not had a previous chest x-ray. Patient has not had a PPD done.  The following portions of the patient's history were reviewed and updated as appropriate: past family history, past medical history, past social history and past surgical history.  Review of Systems Pertinent items are noted in HPI.    Objective:    Oxygen saturation 100% on room air BP 132/93  Pulse 80  Temp(Src) 98.4 F (36.9 C) (Oral)  Ht 5\' 6"  (1.676 m)  Wt 160 lb (72.576 kg)  BMI 25.82 kg/m2  SpO2 100%  PF 450 L/min  LMP 05/17/2011 General appearance: alert and cooperative Head: Normocephalic, without obvious abnormality, atraumatic Eyes: No conjunctival injection Ears: normal TM's and external ear canals both ears Nose: Nares normal. Septum midline. Mucosa normal. No drainage or sinus tenderness. Throat: normal findings: palate normal, tongue midline and normal, soft palate, uvula, and tonsils normal and oropharynx pink & moist without lesions or evidence of thrush Neck: no adenopathy Lungs: clear to auscultation bilaterally Heart: regular rate and rhythm, S1, S2 normal, no murmur, click, rub or gallop    Assessment:    Acute Bronchitis    Plan:    Explained lack of efficacy of  antibiotics in viral disease. Antitussives per medication orders. Avoid exposure to tobacco smoke and fumes. B-agonist inhaler. Call if shortness of breath worsens, blood in sputum, change in character of cough, development of fever or chills, inability to maintain nutrition and hydration. Avoid exposure to tobacco smoke and fumes.

## 2011-07-13 ENCOUNTER — Ambulatory Visit: Payer: Self-pay | Admitting: Family Medicine

## 2011-07-15 ENCOUNTER — Ambulatory Visit (INDEPENDENT_AMBULATORY_CARE_PROVIDER_SITE_OTHER): Payer: Self-pay | Admitting: Family Medicine

## 2011-07-15 ENCOUNTER — Encounter: Payer: Self-pay | Admitting: Family Medicine

## 2011-07-15 ENCOUNTER — Encounter (HOSPITAL_COMMUNITY): Payer: Self-pay | Admitting: *Deleted

## 2011-07-15 ENCOUNTER — Telehealth: Payer: Self-pay | Admitting: Home Health Services

## 2011-07-15 ENCOUNTER — Encounter: Payer: Self-pay | Admitting: Home Health Services

## 2011-07-15 ENCOUNTER — Inpatient Hospital Stay (HOSPITAL_COMMUNITY)
Admission: RE | Admit: 2011-07-15 | Discharge: 2011-07-17 | DRG: 897 | Disposition: A | Discharge status: Home / Self Care | Payer: PRIVATE HEALTH INSURANCE | Source: Intra-hospital | Attending: Psychiatry | Admitting: Psychiatry

## 2011-07-15 VITALS — BP 108/73 | HR 91 | Temp 98.2°F | Ht 64.0 in | Wt 166.1 lb

## 2011-07-15 DIAGNOSIS — J45909 Unspecified asthma, uncomplicated: Secondary | ICD-10-CM | POA: Diagnosis present

## 2011-07-15 DIAGNOSIS — F329 Major depressive disorder, single episode, unspecified: Secondary | ICD-10-CM | POA: Diagnosis present

## 2011-07-15 DIAGNOSIS — I10 Essential (primary) hypertension: Secondary | ICD-10-CM | POA: Diagnosis present

## 2011-07-15 DIAGNOSIS — Z9149 Other personal history of psychological trauma, not elsewhere classified: Secondary | ICD-10-CM

## 2011-07-15 DIAGNOSIS — F4310 Post-traumatic stress disorder, unspecified: Secondary | ICD-10-CM | POA: Diagnosis present

## 2011-07-15 DIAGNOSIS — F411 Generalized anxiety disorder: Secondary | ICD-10-CM

## 2011-07-15 DIAGNOSIS — F909 Attention-deficit hyperactivity disorder, unspecified type: Secondary | ICD-10-CM | POA: Diagnosis present

## 2011-07-15 DIAGNOSIS — Z8711 Personal history of peptic ulcer disease: Secondary | ICD-10-CM

## 2011-07-15 DIAGNOSIS — F3289 Other specified depressive episodes: Secondary | ICD-10-CM | POA: Diagnosis present

## 2011-07-15 DIAGNOSIS — F1210 Cannabis abuse, uncomplicated: Secondary | ICD-10-CM | POA: Diagnosis present

## 2011-07-15 DIAGNOSIS — K219 Gastro-esophageal reflux disease without esophagitis: Secondary | ICD-10-CM

## 2011-07-15 DIAGNOSIS — F3160 Bipolar disorder, current episode mixed, unspecified: Secondary | ICD-10-CM

## 2011-07-15 DIAGNOSIS — F1010 Alcohol abuse, uncomplicated: Principal | ICD-10-CM

## 2011-07-15 DIAGNOSIS — R45851 Suicidal ideations: Secondary | ICD-10-CM

## 2011-07-15 HISTORY — DX: Post-traumatic stress disorder, unspecified: F43.10

## 2011-07-15 LAB — COMPREHENSIVE METABOLIC PANEL
Albumin: 4.1 g/dL (ref 3.5–5.2)
Alkaline Phosphatase: 77 U/L (ref 39–117)
BUN: 9 mg/dL (ref 6–23)
Calcium: 9.3 mg/dL (ref 8.4–10.5)
GFR calc Af Amer: >90 mL/min (ref >90)
Glucose, Bld: 89 mg/dL (ref 70–99)
Potassium: 3.7 mEq/L (ref 3.5–5.1)
Sodium: 137 mEq/L (ref 135–145)
Total Protein: 7.1 g/dL (ref 6.0–8.3)

## 2011-07-15 LAB — CBC
Hemoglobin: 13.5 g/dL (ref 12.0–15.0)
MCH: 31.3 pg (ref 26.0–34.0)
MCHC: 34.9 g/dL (ref 30.0–36.0)
RDW: 13.1 % (ref 11.5–15.5)

## 2011-07-15 MED ORDER — MAGNESIUM HYDROXIDE 400 MG/5ML PO SUSP: 30.0000 mL | Freq: Every day | ORAL | Status: DC | PRN

## 2011-07-15 MED ORDER — ALUM & MAG HYDROXIDE-SIMETH 200-200-20 MG/5ML PO SUSP: 30.0000 mL | ORAL | Status: DC | PRN

## 2011-07-15 MED ORDER — CITALOPRAM HYDROBROMIDE 20 MG PO TABS: 20.0000 mg | ORAL_TABLET | Freq: Every day | ORAL | Status: DC

## 2011-07-15 MED ORDER — RISPERIDONE 1 MG PO TABS
1.0000 mg | ORAL_TABLET | Freq: Every day | ORAL | Status: DC
  Administered 2011-07-15 – 2011-07-16 (×2): 1 mg via ORAL
  Filled 2011-07-15: qty 14
  Filled 2011-07-15 (×4): qty 1

## 2011-07-15 MED ORDER — ALBUTEROL SULFATE HFA 108 (90 BASE) MCG/ACT IN AERS: 2.0000 | INHALATION_SPRAY | RESPIRATORY_TRACT | Status: DC | PRN

## 2011-07-15 MED ORDER — PANTOPRAZOLE SODIUM 40 MG PO TBEC
40.0000 mg | DELAYED_RELEASE_TABLET | Freq: Every day | ORAL | Status: DC
  Administered 2011-07-15: 40 mg via ORAL
  Filled 2011-07-15 (×2): qty 1

## 2011-07-15 MED ORDER — ACETAMINOPHEN 325 MG PO TABS
650.0000 mg | ORAL_TABLET | Freq: Four times a day (QID) | ORAL | Status: DC | PRN
  Administered 2011-07-17 (×2): 650 mg via ORAL

## 2011-07-15 MED ORDER — CITALOPRAM HYDROBROMIDE 20 MG PO TABS
20.0000 mg | ORAL_TABLET | Freq: Every day | ORAL | Status: DC
  Administered 2011-07-15 – 2011-07-17 (×3): 20 mg via ORAL
  Filled 2011-07-15 (×6): qty 1

## 2011-07-15 MED ORDER — RISPERIDONE 1 MG PO TABS: 1.0000 mg | ORAL_TABLET | Freq: Every day | ORAL | Status: DC

## 2011-07-15 NOTE — Progress Notes (Signed)
  Subjective:   [name] is an 31 y.o. female who presents for evaluation and treatment of depressive symptoms.  Onset approximately 1 week ago, gradually worsening since that time.  Says, "I feel like I am in a box and I can't get out" Current symptoms include depressed mood, anhedonia, insomnia, fatigue, hopelessness, suicidal thoughts without plan, Drinking alcohol in order to fall asleep.  Current treatment for depression:None Sleep problems: Marked   Early awakening:Moderate   Energy: Fair Motivation: Fair Concentration: Limited Rumination/worrying: Moderate Memory: Fair Tearfulness: Moderate  Anxiety: Moderate  Panic: Absent  Overall Mood: No change  Hopelessness: Mild Suicidal ideation: Mild  Other/Psychosocial Stressors: Loss of social security check for her dgt has left her without an income.  Pt feels financially dependent on her husband whom she states she wants to leave. She sees her mother's health as poor which weighs on her mind. She does not feel supported by family or friends in caring for her three children.  The FOBs of the her two dgts are not involved in their care.  Family history positive for depression in the patient's father.  Previous treatment modalities employed include Individual therapy, Medication and Substance abuse treatment.  Past episodes of depression: yes Organic causes of depression present: Alcohol abuse.CAGE questionnaire:   Felt the need to CUT DOWN drinking: yes. Felt GUILTY about drinking: yes..  Review of Systems Behavioral/Psych: positive for excessive alcohol consumption and loss of interest in favorite activities   Objective:   Mental Status Examination: Posture and motor behavior: Appropriate Dress, grooming, personal hygiene: Very Fashionably dressed and make-up'd. Facial expression: Appropriate Speech: Negative for pressured speech or psychomotor slowing. Mood: Positive for sadness  Affect: tearful Coherency and  relevance of thought: thought constructs creatively constricted but relevant to situation Thought content: no delusions Perceptions: No hallucinations Orientation:Appropriate Attention and concentration: Appropriate Memory: : Appropriate Information: Not examined Vocabulary: Appropriate Abstract reasoning: Not examined Judgment: Not examined    Assessment:   Experiencing the following symptoms of depression most of the day nearly every day for more than two consecutive weeks: depressed mood, loss of interests/pleasure, change in sleep, loss of energy, thoughts of worthlessness or guilt, thoughts about death or suicide  Depressive Disorder: Adjustment disorder vs endogenous exacerbation of Bipolar (Mixed) Disorder.  Suicide Risk Assessment:  Suicidal intent: While having thought of death, she has no intention of harming herself.  Suicidal plan: No plans Access to means for suicide: pills Lethality of means for suicide: potentially lethal.  Prior suicide attempts: none Recent exposure to suicide:none.    Plan:    1. Generalized anxiety disorder  risperiDONE (RISPERDAL) 1 MG tablet, citalopram (CELEXA) 20 MG tablet, DISCONTINUED: citalopram (CELEXA) 20 MG tablet  2. BIPOLAR AFFECTIVE DISORDER, MIXED  risperiDONE (RISPERDAL) 1 MG tablet, citalopram (CELEXA) 20 MG tablet, DISCONTINUED: citalopram (CELEXA) 20 MG tablet    Instructed patient to contact office or on-call physician promptly should condition worsen or any new symptoms appear and provided on-call telephone numbers.  Patient contracted for safety with Dr Enio Hornback.  Restart Celexa 20 mg daily Asked patient to restart Risperdal 1 mg at night to help with sleep. Refrain from using Alcohol at night to help with sleep induction.  Pt given cash from our indigent fund to cover the cost of one month of Celexa and Risperdal.   Patient willing to go to Behavioral [facility] assessment center this afternoon to see if IOP or inpatient  therapy is appropriate.

## 2011-07-15 NOTE — Telephone Encounter (Signed)
Pt reported needed help with some of her medication co-pays.   Physician agreed that the patient should not be with out her  Clexa and Risperdal.   Pt was given $49 from indigent fund to help with medications.   Pt was given note for pharmacist to fax back once she picked up her medications.  Pt is aware this is a one time resource.  We talked about resources such as [facility] Card/MAP program.

## 2011-07-15 NOTE — Patient Instructions (Signed)
Go to [facility] a [phone] ([phone]). The assessment Center will see if you would benefit from intensive outpatient or inpatient therapy. I recommend you go there immediately from this office visit.   If you decide you want to hurt yourself or others, let Dr Gregorey Nabor [phone]) know immediately.
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
## 2011-07-15 NOTE — Progress Notes (Signed)
Pt. Pleasant and cooperative.  Interacting appropriately with peers.  Compliant with medications and reinforced to patient what medications she would have this evening and their dosages.  Support given.
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
## 2011-07-15 NOTE — Tx Team (Signed)
Initial Interdisciplinary Treatment Plan  PATIENT STRENGTHS: (choose at least two)  Ability for insight Active sense of humor Average or above average intelligence General fund of knowledge Motivation for treatment/growth Physical Health Religious Affiliation Supportive family/friends Work skills  PATIENT STRESSORS: 1. Financial difficulties Health problems Marital or family conflict Substance abuse   PROBLEM LIST: Problem List/Patient Goals Date to be addressed Date deferred Reason deferred Estimated date of resolution  Bipolar      ADHD      Social anxiety disorder      PTSD                                     DISCHARGE CRITERIA:  Ability to meet basic life and health needs Adequate post-discharge living arrangements Improved stabilization in mood, thinking, and/or behavior Medical problems require only outpatient monitoring Motivation to continue treatment in a less acute level of care Need for constant or close observation no longer present Reduction of life-threatening or endangering symptoms to within safe limits Safe-care adequate arrangements made Verbal commitment to aftercare and medication compliance Withdrawal symptoms are absent or subacute and managed without 24-hour nursing intervention  PRELIMINARY DISCHARGE PLAN: Attend aftercare/continuing care group Outpatient therapy  PATIENT/FAMIILY INVOLVEMENT: This treatment plan has been presented to and reviewed with the patient, [name], and/or family member. The patient and family have been given the opportunity to ask questions and make suggestions.  Izola Price Mae 07/15/2011, 3:41 PM
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
## 2011-07-16 DIAGNOSIS — R45851 Suicidal ideations: Secondary | ICD-10-CM

## 2011-07-16 DIAGNOSIS — F3160 Bipolar disorder, current episode mixed, unspecified: Secondary | ICD-10-CM

## 2011-07-16 DIAGNOSIS — F1010 Alcohol abuse, uncomplicated: Principal | ICD-10-CM

## 2011-07-16 DIAGNOSIS — F411 Generalized anxiety disorder: Secondary | ICD-10-CM

## 2011-07-16 LAB — TSH: TSH: 2.821 u[IU]/mL (ref 0.350–4.500)

## 2011-07-16 MED ORDER — TRAZODONE HCL 50 MG PO TABS
50.0000 mg | ORAL_TABLET | Freq: Every evening | ORAL | Status: DC | PRN
  Administered 2011-07-16 (×2): 50 mg via ORAL
  Filled 2011-07-16 (×2): qty 1
  Filled 2011-07-16: qty 14
  Filled 2011-07-16 (×2): qty 1
  Filled 2011-07-16: qty 14
  Filled 2011-07-16: qty 1

## 2011-07-16 MED ORDER — HYDROCHLOROTHIAZIDE 25 MG PO TABS
25.0000 mg | ORAL_TABLET | Freq: Every day | ORAL | Status: DC
  Administered 2011-07-16 – 2011-07-17 (×2): 25 mg via ORAL
  Filled 2011-07-16 (×4): qty 1

## 2011-07-16 MED ORDER — NICOTINE 7 MG/24HR TD PT24
7.0000 mg | MEDICATED_PATCH | Freq: Every day | TRANSDERMAL | Status: DC
  Administered 2011-07-16 – 2011-07-17 (×2): 7 mg via TRANSDERMAL
  Filled 2011-07-16 (×5): qty 1

## 2011-07-16 MED ORDER — PANTOPRAZOLE SODIUM 40 MG PO TBEC
40.0000 mg | DELAYED_RELEASE_TABLET | Freq: Every day | ORAL | Status: DC
  Administered 2011-07-16 – 2011-07-17 (×2): 40 mg via ORAL
  Filled 2011-07-16 (×5): qty 1
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
## 2011-07-16 NOTE — Discharge Planning (Signed)
New patient attended AM group,.  Good mood.  Denies SI.  States that depression is minimal and she is now very hopeful that she has spoken to some of her children's fathers who are agreeable to increase financial support.  She states her relationship with her husband is over, and as soon as she can figure out a plan for self support, she will ask him to leave the home.  Admits to recent relapse of alcohol and marijuana.  States she feels confident she will continue her sobriety by staying away from the negative influence of her husband, and by continuing to get support through her church.  Follow up at [facility].

## 2011-07-16 NOTE — Progress Notes (Signed)
Pt pleasant on approach, hopeful for discharge soon.  Signed 72 hour request for discharge at 0500 on Saturday morning. Pt requested Trazodone for sleep and those orders were received.  Denies SI/HI/hallucinations, interacting appropriately on unit.  Support and encouragement offered, will continue to monitor.
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
## 2011-07-16 NOTE — Progress Notes (Signed)
[facility] Adult Inpatient Family/Significant Other Suicide Prevention Education  Suicide Prevention Education:  Education Completed; Mother Scharlene Corn at [phone] and patient's husband who was visiting and patiner provided verbal consent were each provided suicide prevention education. Husband has been identified by the patient as the family member/significant other with whom the patient will be residing, and both husband Mother identified as the person(s) who will aid the patient in the event of a mental health crisis (suicidal ideations/suicide attempt).  With written consent from the patient, the family member/significant other has been provided the following suicide prevention education, prior to the and/or following the discharge of the patient.  The suicide prevention education provided includes the following:  Suicide risk factors  Suicide prevention and interventions  National Suicide Hotline telephone number  [facility] assessment telephone number  [facility] Emergency Assistance 911  [facility] and/or Residential Mobile Crisis Unit telephone number  Request made of family/significant other to:  Remove weapons (e.g., guns, rifles, knives), all items previously/currently identified as safety concern.    Remove drugs/medications (over-the-counter, prescriptions, illicit drugs), all items previously/currently identified as a safety concern.  The family member/significant other verbalizes understanding of the suicide prevention education information provided.  The family member/significant other agrees to remove the items of safety concern listed above.  Clide Dales 07/16/2011, 7:05 PM

## 2011-07-16 NOTE — Progress Notes (Signed)
Slept poorly last nite, appetite is good, energy level is normal, ability to pay attention is improving, depressed 8/10 and hopeless 4/10, denies Si or Hi, attending group and participating, interacting w/peers in dayroom, eatin meals in the DR, taking meds as ordered by MD, requested HCTZ 25 for BP, asked NP for order. q40min safety checks continue and support offered Safety maintained

## 2011-07-16 NOTE — Progress Notes (Signed)
Pt laying in bed resting with eyes closed. Respirations even and unlabored. No distress noted.  

## 2011-07-16 NOTE — Treatment Plan (Signed)
Interdisciplinary Treatment Plan Update (Adult)  Date: 07/16/2011  Time Reviewed: 8:25 AM   Progress in Treatment: Attending groups: Yes Participating in groups: Yes Taking medication as prescribed: Yes Tolerating medication: Yes   Family/Significant other contact made: Counselor to contact if suicide prevention information needed Patient understands diagnosis:  Yes  As evidenced by asking for help with depression Discussing patient identified problems/goals with staff:  Yes See below Medical problems stabilized or resolved:  Yes Denies suicidal/homicidal ideation: Yes On self inventroy Issues/concerns per patient self-inventory:  Yes  Sleep poor  Depression 8, hopelessness 4 Other:  New problem(s) identified: N/A  Reason for Continuation of Hospitalization: Medication stabilization  Interventions implemented related to continuation of hospitalization: Medication trial of Celexa, Risperdal  Encourage group attendance and participation  Additional comments:  Estimated length of stay: 1-2 days  Discharge Plan: Return home, follow up [facility]  New goal(s): N/A  Review of initial/current patient goals per problem list:   1.  Goal(s):Eliminate SI  Met:  Yes  Target date:5/3  As evidenced [id number] denies SI today   2.  Goal (s):Decrease depression  Met:  No  Target date:5/5  As evidenced UJ:WJXB rating of 3 or less on self inventory  3.  Goal(s):Identify comprehensive mental wellness and sobriety plan  Met:  No  Target date:5/5  As evidenced JY:NWGN report  4.  Goal(s):  Met:  No  Target date:  As evidenced by:  Attendees: Patient:  [name] 07/16/2011 8:25 AM  Family:     Physician:  Mickeal Skinner 07/16/2011 8:25 AM   Nursing: Alease Frame   07/16/2011 8:25 AM   Case Manager:  Richelle Ito, LCSW 07/16/2011 8:25 AM   Counselor:  Ronda Fairly, LCSWA 07/16/2011 8:25 AM   Other:  Lynann Bologna 07/16/2011 8:25 AM  Other:     Other:     Other:       Scribe for Treatment Team:   Ida Rogue, 07/16/2011 8:25 AM
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
## 2011-07-17 DIAGNOSIS — F3289 Other specified depressive episodes: Secondary | ICD-10-CM

## 2011-07-17 DIAGNOSIS — F329 Major depressive disorder, single episode, unspecified: Secondary | ICD-10-CM

## 2011-07-17 MED ORDER — NICOTINE 7 MG/24HR TD PT24: 1.0000 | MEDICATED_PATCH | TRANSDERMAL | Status: DC

## 2011-07-17 MED ORDER — HYDROCHLOROTHIAZIDE 25 MG PO TABS: 25.0000 mg | ORAL_TABLET | Freq: Every day | ORAL | Status: DC

## 2011-07-17 MED ORDER — OMEPRAZOLE 40 MG PO CPDR: 40.0000 mg | DELAYED_RELEASE_CAPSULE | Freq: Two times a day (BID) | ORAL | Status: DC

## 2011-07-17 MED ORDER — CITALOPRAM HYDROBROMIDE 40 MG PO TABS: 40.0000 mg | ORAL_TABLET | Freq: Every day | ORAL | Status: DC

## 2011-07-17 MED ORDER — CITALOPRAM HYDROBROMIDE 20 MG PO TABS: 20.0000 mg | ORAL_TABLET | Freq: Every day | ORAL | Status: DC

## 2011-07-17 MED ORDER — RISPERIDONE 1 MG PO TABS: 1.0000 mg | ORAL_TABLET | Freq: Every day | ORAL | Status: DC

## 2011-07-17 MED ORDER — TRAZODONE HCL 50 MG PO TABS
50.0000 mg | ORAL_TABLET | Freq: Every day | ORAL | Status: DC
  Filled 2011-07-17: qty 14

## 2011-07-17 MED ORDER — CETIRIZINE HCL 10 MG PO TABS: 10.0000 mg | ORAL_TABLET | Freq: Every day | ORAL | Status: DC

## 2011-07-17 MED ORDER — CITALOPRAM HYDROBROMIDE 40 MG PO TABS
40.0000 mg | ORAL_TABLET | Freq: Every day | ORAL | Status: DC
  Filled 2011-07-17: qty 1
  Filled 2011-07-17: qty 14

## 2011-07-17 MED ORDER — TRAZODONE HCL 50 MG PO TABS: ORAL_TABLET | ORAL | Status: DC

## 2011-07-17 NOTE — Progress Notes (Signed)
Patient ID: [name], female   DOB: 1981-02-25, 31 y.o.   MRN: [id number] Discussed and reviewed discharge information, including follow-up information.  Patient verbalized understanding. Pt. Discharged home.
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
## 2011-07-17 NOTE — H&P (Signed)
Medical/psychiatric screening examination/treatment/procedure(s) were performed by non-physician practitioner and as supervising physician I was immediately available for consultation/collaboration.   

## 2011-07-17 NOTE — Progress Notes (Signed)
[facility] Case Management Discharge Plan:  Will you be returning to the same living situation after discharge: Yes,   At discharge, do you have transportation home?:Yes,   Do you have the ability to pay for your medications:Yes,    Interagency Information:     Release of information consent forms completed and in the chart;  Patient's signature needed at discharge.  Patient to Follow up at:  Follow-up Information    Follow up with [facility] on 07/20/2011. (Walk-in at [facility] for a hospital re-entry appointment.  If Tues does not work for you, Wed or Magdalene Molly is fine as well.)    Contact information:   [address]  [place]  [phone]         Patient denies SI/HI:   Yes,      Aeronautical engineer and Suicide Prevention discussed:  Yes,    Barrier to discharge identified:None  Summary and Recommendations: Pt. Will follow up with appointments and will be transported home by her husband.   Lamar Blinks [place] 07/17/2011, 4:05 PM

## 2011-07-17 NOTE — Progress Notes (Signed)
Patient ID: [name], female   DOB: Nov 13, 1980, 31 y.o.   MRN: [id number] Pt. attended and participated in aftercare planning group. Pt. accepted information on suicide prevention, warning signs to look for with suicide and crisis line numbers to use. The pt. agreed to call crisis line numbers if having warning signs or having thoughts of suicide. Pt. listed their current anxiety level as 0 and depression as 2 on scale of 1 to 10 with 10 being the high.

## 2011-07-17 NOTE — Progress Notes (Signed)
Patient ID: [name], female   DOB: 1980-10-24, 31 y.o.   MRN: [id number]  [facility] Group Notes:  (Counselor/Nursing/MHT/Case Management/Adjunct)  07/17/2011 1:15 PM  Type of Therapy:  Group Therapy, Dance/Movement Therapy   Participation Level:  Active  Participation Quality:  Appropriate  Affect:  Appropriate  Cognitive:  Appropriate  Insight:  Good  Engagement in Group:  Good  Engagement in Therapy:  Good  Modes of Intervention:  Clarification, Problem-solving, Role-play, Socialization and Support  Summary of Progress/Problems:  Therapist invited group to draw themselves climbing a mountain to include obstacles, challenges and supplies that will enable them to move up the mountain.  Therapist asked group to discuss the challenges in order for group to assess where they are presently and where they want to be.  Pt. stated "at the bottom of my mountain is anxiety, my husband and kids are pushing me up the mountain".      Rhunette Croft

## 2011-07-20 NOTE — Progress Notes (Signed)
Patient Discharge Instructions:  After Visit Summary (AVS):   Faxed to:  07/20/2011 Face Sheet:   Faxed to:  07/20/2011 Psychiatric Admission Assessment Note:   Faxed to:  07/20/2011 Suicide Risk Assessment - Discharge Assessment:   Faxed to:  07/20/2011 Faxed/Sent to the Next Level Care provider:  07/20/2011  Faxed to [facility] @ [phone]  Heloise Purpura Eduard Clos, 07/20/2011, 5:08 PM

## 2011-07-20 NOTE — H&P (Signed)
Medical/psychiatric screening examination/treatment/procedure(s) were performed by non-physician practitioner and as supervising physician I was immediately available for consultation/collaboration.   
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
## 2011-07-28 ENCOUNTER — Telehealth: Payer: Self-pay | Admitting: Family Medicine

## 2011-07-28 DIAGNOSIS — N644 Mastodynia: Secondary | ICD-10-CM

## 2011-07-28 NOTE — Telephone Encounter (Signed)
Medicaid has started back and she is ready for the referral for a Mammogram.  [facility]  Please call her with the appt.

## 2011-07-28 NOTE — Telephone Encounter (Signed)
Pt is only 30 will forward to MD to see if he know why she would need this?

## 2011-08-02 NOTE — Progress Notes (Signed)
Addended by: Jone Baseman D on: 08/02/2011 02:43 PM   Modules accepted: Orders

## 2011-08-02 NOTE — Telephone Encounter (Signed)
Pt informed and asked that mail her an appt, placed in mail today. [name], [name]

## 2011-08-02 NOTE — Telephone Encounter (Signed)
Please set up a diagnostic right breast mammogram for patient for persistent right breast pain.  Order is signed.

## 2011-08-06 ENCOUNTER — Other Ambulatory Visit: Payer: Self-pay

## 2011-08-16 ENCOUNTER — Ambulatory Visit
Admission: RE | Admit: 2011-08-16 | Discharge: 2011-08-16 | Disposition: A | Discharge status: Home / Self Care | Payer: PRIVATE HEALTH INSURANCE | Source: Ambulatory Visit | Attending: Family Medicine | Admitting: Family Medicine

## 2011-08-16 ENCOUNTER — Other Ambulatory Visit: Payer: Self-pay | Admitting: Family Medicine

## 2011-08-16 DIAGNOSIS — N63 Unspecified lump in unspecified breast: Secondary | ICD-10-CM

## 2011-08-23 ENCOUNTER — Encounter: Payer: Self-pay | Admitting: Family Medicine

## 2011-09-03 ENCOUNTER — Ambulatory Visit: Payer: Self-pay | Admitting: Family Medicine

## 2011-09-14 ENCOUNTER — Other Ambulatory Visit: Payer: Self-pay | Admitting: Family Medicine

## 2011-09-14 ENCOUNTER — Ambulatory Visit (INDEPENDENT_AMBULATORY_CARE_PROVIDER_SITE_OTHER): Payer: Medicaid Other | Admitting: Family Medicine

## 2011-09-14 ENCOUNTER — Encounter: Payer: Self-pay | Admitting: Family Medicine

## 2011-09-14 VITALS — BP 126/81 | HR 98 | Ht 66.0 in | Wt 177.0 lb

## 2011-09-14 DIAGNOSIS — Z3201 Encounter for pregnancy test, result positive: Secondary | ICD-10-CM

## 2011-09-14 DIAGNOSIS — N912 Amenorrhea, unspecified: Secondary | ICD-10-CM

## 2011-09-14 DIAGNOSIS — R110 Nausea: Secondary | ICD-10-CM

## 2011-09-14 DIAGNOSIS — Z331 Pregnant state, incidental: Secondary | ICD-10-CM

## 2011-09-14 LAB — POCT URINE PREGNANCY: Preg Test, Ur: POSITIVE

## 2011-09-14 MED ORDER — PROMETHAZINE HCL 25 MG PO TABS: 25.0000 mg | ORAL_TABLET | Freq: Three times a day (TID) | ORAL | Status: DC | PRN

## 2011-09-14 NOTE — Assessment & Plan Note (Addendum)
+   urine pregnancy test- in patient with history of tubal ligation.  Pt to take prenatal vitamin daily.  Pt to avoid etoh.  Pt to take phenergan prn for nausea.  Will order u/s to confirm pregnancy- check dating since LMP unsure, and to ensure proper placement of pregnancy since patient at increased risk for tubal pregnancy in the setting of tubal ligation. Pt to apply for pregnancy medicaid, and then return for New OB appointment. Need to review med list with patient at f/up appointment.  No medications are directly contraindicated.  But there are some medcations that are Class C- trazadone, risperdol- that pt and doctor need to weigh the risk: benefit ratio.

## 2011-09-14 NOTE — Patient Instructions (Addendum)
We will call you with your Ultrasound appointment.  Take phenergan as needed for nausea.    Folic Acid in Pregnancy Folic acid is a B vitamin that helps prevent neural tube defects (NTDs). The neural tube is the part of a developing baby that becomes the brain and spinal cord. When the neural tube does not close properly, a baby is born with an NTD. NTDs include spina bifida, hernia of the spinal cord, and the absence of part of, or all of the brain (anencephaly).  Take folic acid at least 4 weeks before getting pregnant and through the first 3 months of pregnancy. This is when the neural tube is developing. It is available in most multivitamins, as a folic acid-only supplement, and in some foods. Taking the right amount of folic acid before conception and during pregnancy lessens the chances of having a baby born with an NTD. Giving folic acid will not affect a neural tube defect if it is already present. DIAGNOSIS   An Alpha-Fetoprotein (AFP) blood or amniotic fluid test will show high levels of the alpha-feto protein if a woman is carrying a baby with an NTD. This test is done on all pregnant women in the first trimester.   An ultrasound may detect an NTD.  WHAT YOU CAN DO:  Take a multivitamin with at least 0.4 milligrams (400 micrograms) of folic acid daily at least 4 weeks before getting pregnant and through the first 12 weeks of pregnancy.   If you have already had a pregnancy affected by an NTD, take 4 milligrams (4,000 micrograms) of folic acid daily. Take this amount 1 month before you start trying to get pregnant and continue through the first 3 months of pregnancy.   Talk to your caregiver if you are taking medicines for seizures. Your caregiver will be able to manage your seizure medicines and your pregnancy in the best way.  FOLIC ACID IN FOODS Eat a healthy diet that has foods that contain folic acid, the natural form of the vitamin. Such foods include:  Fortified breakfast  cereals.   Lentils.   Asparagus.   Spinach.   Organ meats (liver).   Black beans.   Peanuts (eat only if you do not have a peanut allergy).   Broccoli.   Strawberries, oranges.   Orange juice (from concentrate is best).   Enriched breads and pasta.   Romaine lettuce.  TALK TO YOUR CAREGIVER IF:  You are in your first trimester and have high blood sugar.   You are in your first trimester and have an oral temperature above 102 F (38.9 C).   You are in your first trimester and had sauna treatments.   You are pregnant (or want to become pregnant) and take a prescription medicine called valproic acid.  In almost all cases, a fetus found to have an NTD will need specialized care that may not be available in all hospitals. Talk to your caregiver about what is best for you and your baby. Document Released: 03/04/2003 Document Revised: 02/18/2011 Document Reviewed: 06/04/2009 [facility] Patient Information 2012 [place], [place].

## 2011-09-14 NOTE — Progress Notes (Signed)
  Subjective:    Patient ID: [name], female    DOB: 09/04/1980, 31 y.o.   MRN: [id number]  HPI Concern for pregnancy: Patient states she had a tubal ligation 4 years ago. Patient states that she began to have nausea, breast tenderness, and just" felt pregnant". Took for pregnancy test at home yesterday-all positive. Patient is unsure of last menstrual cycle. She thinks it was mid May. This would put her at about 6.[redacted] weeks pregnant. No vaginal bleeding. No abdominal pain. No fever. No vaginal discharge. Patient has not been taking prenatal vitamin. Patient has been drinking some said she did not know she was at risk for pregnancy. Patient tearful concerning pregnancy. States that this was not planned of course. States that she does not believe an abortion. And could not consider adoption. Plans to keep pregnancy.   Review of Systems As per above.    Objective:   Physical Exam  Constitutional: She appears well-developed and well-nourished.  HENT:  Head: Normocephalic and atraumatic.  Cardiovascular: Normal rate, regular rhythm and normal heart sounds.   No murmur heard. Pulmonary/Chest: Effort normal. No respiratory distress. She has no wheezes. She has no rales.  Abdominal: Soft. She exhibits no distension. There is no tenderness. There is no rebound and no guarding.  Musculoskeletal: She exhibits no edema.  Skin: No rash noted.  Psychiatric: She has a normal mood and affect.          Assessment & Plan:

## 2011-09-17 ENCOUNTER — Ambulatory Visit (HOSPITAL_COMMUNITY)
Admission: RE | Admit: 2011-09-17 | Discharge: 2011-09-17 | Disposition: A | Discharge status: Home / Self Care | Payer: Medicaid Other | Source: Ambulatory Visit | Attending: Family Medicine | Admitting: Family Medicine

## 2011-09-17 DIAGNOSIS — N912 Amenorrhea, unspecified: Secondary | ICD-10-CM

## 2011-09-17 DIAGNOSIS — Z3689 Encounter for other specified antenatal screening: Secondary | ICD-10-CM | POA: Insufficient documentation

## 2011-09-17 DIAGNOSIS — O10019 Pre-existing essential hypertension complicating pregnancy, unspecified trimester: Secondary | ICD-10-CM | POA: Insufficient documentation

## 2011-09-22 ENCOUNTER — Inpatient Hospital Stay (HOSPITAL_COMMUNITY): Payer: Medicaid Other

## 2011-09-22 ENCOUNTER — Inpatient Hospital Stay (HOSPITAL_COMMUNITY)
Admission: AD | Admit: 2011-09-22 | Discharge: 2011-09-22 | Disposition: A | Discharge status: Home / Self Care | Payer: Medicaid Other | Source: Ambulatory Visit | Attending: Obstetrics & Gynecology | Admitting: Obstetrics & Gynecology

## 2011-09-22 ENCOUNTER — Encounter (HOSPITAL_COMMUNITY): Payer: Self-pay

## 2011-09-22 DIAGNOSIS — M545 Low back pain, unspecified: Secondary | ICD-10-CM

## 2011-09-22 DIAGNOSIS — R109 Unspecified abdominal pain: Secondary | ICD-10-CM | POA: Insufficient documentation

## 2011-09-22 DIAGNOSIS — O209 Hemorrhage in early pregnancy, unspecified: Secondary | ICD-10-CM

## 2011-09-22 HISTORY — DX: Depression, unspecified: F32.A

## 2011-09-22 HISTORY — DX: Major depressive disorder, single episode, unspecified: F32.9

## 2011-09-22 LAB — CBC
MCH: 30.2 pg (ref 26.0–34.0)
MCV: 87.6 fL (ref 78.0–100.0)
Platelets: 242 10*3/uL (ref 150–400)
RBC: 4.11 MIL/uL (ref 3.87–5.11)

## 2011-09-22 LAB — URINALYSIS, ROUTINE W REFLEX MICROSCOPIC
Bilirubin Urine: NEGATIVE
Nitrite: NEGATIVE
Specific Gravity, Urine: 1.015 (ref 1.005–1.030)
pH: 6.5 (ref 5.0–8.0)

## 2011-09-22 LAB — URINE MICROSCOPIC-ADD ON

## 2011-09-22 LAB — WET PREP, GENITAL

## 2011-09-22 MED ORDER — ACETAMINOPHEN 500 MG PO TABS
1000.0000 mg | ORAL_TABLET | Freq: Once | ORAL | Status: AC
  Administered 2011-09-22: 1000 mg via ORAL
  Filled 2011-09-22: qty 2

## 2011-09-22 MED ORDER — OXYCODONE-ACETAMINOPHEN 5-325 MG PO TABS: 1.0000 | ORAL_TABLET | ORAL | Status: AC | PRN

## 2011-09-22 MED ORDER — RHO D IMMUNE GLOBULIN 1500 UNIT/2ML IJ SOLN
300.0000 ug | Freq: Once | INTRAMUSCULAR | Status: AC
  Administered 2011-09-22: 300 ug via INTRAMUSCULAR

## 2011-09-22 MED ORDER — OXYCODONE HCL 5 MG PO TABS
10.0000 mg | ORAL_TABLET | Freq: Once | ORAL | Status: AC
  Administered 2011-09-22: 10 mg via ORAL
  Filled 2011-09-22: qty 2
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
## 2011-09-22 NOTE — MAU Note (Signed)
Patient states she has had spotting and cramping since 7-2, worse today and more dark bleeding.

## 2011-09-23 LAB — RH IG WORKUP (INCLUDES ABO/RH)
ABO/RH(D): O NEG
Gestational Age(Wks): 7.2

## 2011-09-24 LAB — URINE CULTURE: Special Requests: NORMAL

## 2011-09-28 ENCOUNTER — Encounter: Payer: Medicaid Other | Admitting: Family Medicine

## 2011-09-29 ENCOUNTER — Telehealth: Payer: Self-pay | Admitting: Obstetrics and Gynecology

## 2011-09-29 NOTE — Telephone Encounter (Signed)
After consult with VL, Ok for pt to be seen 09/30/11.  TC to pt. Requests appt with Dr Maryellen Pile.   Sched 09/30/11.   To call with any increase in sx.

## 2011-09-29 NOTE — Telephone Encounter (Signed)
Message copied by Mason Jim on Wed Sep 29, 2011  8:45 AM ------      Message from: Cornelius Moras      Created: Wed Sep 29, 2011  7:29 AM      Regarding: Patient needs appointment       Previous patient of [facility]--had BTL 4 years ago, now pregnant.      Spotting.  Had [place] at [facility] earlier this pregnancy for spotting, still spotting.  Wants appoitment at [facility]--please call her this am and get her set up to be seen.      Call me for information.

## 2011-09-30 ENCOUNTER — Ambulatory Visit (INDEPENDENT_AMBULATORY_CARE_PROVIDER_SITE_OTHER): Payer: Medicaid Other | Admitting: Obstetrics and Gynecology

## 2011-09-30 ENCOUNTER — Encounter: Payer: Medicaid Other | Admitting: Obstetrics and Gynecology

## 2011-09-30 ENCOUNTER — Encounter: Payer: Self-pay | Admitting: Obstetrics and Gynecology

## 2011-09-30 VITALS — BP 122/62 | Temp 98.7°F | Wt 172.0 lb

## 2011-09-30 VITALS — BP 122/62 | Wt 172.0 lb

## 2011-09-30 DIAGNOSIS — N912 Amenorrhea, unspecified: Secondary | ICD-10-CM

## 2011-09-30 DIAGNOSIS — N831 Corpus luteum cyst of ovary, unspecified side: Secondary | ICD-10-CM

## 2011-09-30 DIAGNOSIS — N939 Abnormal uterine and vaginal bleeding, unspecified: Secondary | ICD-10-CM

## 2011-09-30 DIAGNOSIS — Z331 Pregnant state, incidental: Secondary | ICD-10-CM

## 2011-09-30 DIAGNOSIS — N926 Irregular menstruation, unspecified: Secondary | ICD-10-CM

## 2011-09-30 DIAGNOSIS — Z9851 Tubal ligation status: Secondary | ICD-10-CM

## 2011-09-30 DIAGNOSIS — R1031 Right lower quadrant pain: Secondary | ICD-10-CM

## 2011-09-30 HISTORY — DX: Tubal ligation status: Z98.51

## 2011-09-30 NOTE — Patient Instructions (Addendum)
Vaginal Bleeding During Pregnancy, First Trimester  A small amount of bleeding (spotting) is relatively common in early pregnancy. It usually stops on its own. There are many causes for bleeding or spotting in early pregnancy. Some bleeding may be related to the pregnancy and some may not. Cramping with the bleeding is more serious and concerning. Tell your caregiver if you have any vaginal bleeding.   CAUSES    It is normal in most cases.   The pregnancy ends (miscarriage).   The pregnancy may end (threatened miscarriage).   Infection or inflammation of the cervix.   Growths (polyps) on the cervix.   Pregnancy happens outside of the uterus and in a fallopian tube (tubal pregnancy).   Many tiny cysts in the uterus instead of pregnancy tissue (molar pregnancy).  SYMPTOMS   Vaginal bleeding or spotting with or without cramps.  DIAGNOSIS   To evaluate the pregnancy, your caregiver may:   Do a pelvic exam.   Take blood tests.   Do an ultrasound.  It is very important to follow your caregiver's instructions.   TREATMENT    Evaluation of the pregnancy with blood tests and ultrasound.   Bed rest (getting up to use the bathroom only).   Rho-gam immunization if the mother is Rh negative and the father is Rh positive.  HOME CARE INSTRUCTIONS    If your caregiver orders bed rest, you may need to make arrangements for the care of other children and for other responsibilities. However, your caregiver may allow you to continue light activity.   Keep track of the number of pads you use each day, how often you change pads and how soaked (saturated) they are. Write this down.   Do not use tampons. Do not douche.   Do not have sexual intercourse or orgasms until approved by your physician.   Save any tissue that you pass for your caregiver to see.   Take medicine for cramps only with your caregiver's permission.   Do not take aspirin because it can make you bleed.  SEEK IMMEDIATE MEDICAL CARE IF:    You  experience severe cramps in your stomach, back or belly (abdomen).   You have an oral temperature above 102 F (38.9 C), not controlled by medicine.   You pass large clots or tissue.   Your bleeding increases or you become light-headed, weak or have fainting episodes.   You develop chills.   You are leaking or have a gush of fluid from your vagina.   You pass out while having a bowel movement. That may mean you have a ruptured tubal pregnancy.  Document Released: 12/09/2004 Document Revised: 02/18/2011 Document Reviewed: 06/20/2008  [employer] Patient Information 2012 [employer].

## 2011-09-30 NOTE — Progress Notes (Deleted)
Pt c/o vaginal bleeding. States that it is only when she wipes after voiding. C/o lots of back pain.

## 2011-10-01 NOTE — Progress Notes (Signed)
This encounter was created in error - please disregard.

## 2011-10-01 NOTE — Patient Instructions (Addendum)
Vaginal Bleeding During Pregnancy, First Trimester  A small amount of bleeding (spotting) is relatively common in early pregnancy. It usually stops on its own. There are many causes for bleeding or spotting in early pregnancy. Some bleeding may be related to the pregnancy and some may not. Cramping with the bleeding is more serious and concerning. Tell your caregiver if you have any vaginal bleeding.   CAUSES    It is normal in most cases.   The pregnancy ends (miscarriage).   The pregnancy may end (threatened miscarriage).   Infection or inflammation of the cervix.   Growths (polyps) on the cervix.   Pregnancy happens outside of the uterus and in a fallopian tube (tubal pregnancy).   Many tiny cysts in the uterus instead of pregnancy tissue (molar pregnancy).  SYMPTOMS   Vaginal bleeding or spotting with or without cramps.  DIAGNOSIS   To evaluate the pregnancy, your caregiver may:   Do a pelvic exam.   Take blood tests.   Do an ultrasound.  It is very important to follow your caregiver's instructions.   TREATMENT    Evaluation of the pregnancy with blood tests and ultrasound.   Bed rest (getting up to use the bathroom only).   Rho-gam immunization if the mother is Rh negative and the father is Rh positive.  HOME CARE INSTRUCTIONS    If your caregiver orders bed rest, you may need to make arrangements for the care of other children and for other responsibilities. However, your caregiver may allow you to continue light activity.   Keep track of the number of pads you use each day, how often you change pads and how soaked (saturated) they are. Write this down.   Do not use tampons. Do not douche.   Do not have sexual intercourse or orgasms until approved by your physician.   Save any tissue that you pass for your caregiver to see.   Take medicine for cramps only with your caregiver's permission.   Do not take aspirin because it can make you bleed.  SEEK IMMEDIATE MEDICAL CARE IF:    You  experience severe cramps in your stomach, back or belly (abdomen).   You have an oral temperature above 102 F (38.9 C), not controlled by medicine.   You pass large clots or tissue.   Your bleeding increases or you become light-headed, weak or have fainting episodes.   You develop chills.   You are leaking or have a gush of fluid from your vagina.   You pass out while having a bowel movement. That may mean you have a ruptured tubal pregnancy.  Document Released: 12/09/2004 Document Revised: 02/18/2011 Document Reviewed: 06/20/2008  [employer] Patient Information 2012 [employer].

## 2011-10-01 NOTE — Progress Notes (Signed)
  Patient ID: [name], female DOB: 29-Feb-1981, 31 y.o. MRN: [id number]  Medical history: Multiple medical problems. see problem list  Subjective:  LMP 07/05/11  Patient is status post tubal ligation with subsequent pregnancy. She had an established intrauterine pregnancy. She has had first trimester bleeding that had never been activity ad inferiorly. She has received RhoGAM appropriately. She states that her bleeding had almost stopped. She has right flank pain which is relieved by pain medication.  Objective:  BP 122/62  Temp 98.7 F (37.1 C)  Wt 172 lb (78.019 kg)  LMP 07/05/2011  Pelvic: Vulva: Normal  Vagina: Small amount of blood in the vault.  Cervix: Small amount of blood from the os. No cervical motion tenderness  Uterus: 8 weeks size and nontender  Adnexae: No Masses or tenderness  Ultrasound on 7/10/ 13= intrauterine pregnancy at 8 weeks. Right corpus luteum cyst  Urine culture:greater than 100,000 colonies of multiple species  Blood type O-. Received RhoGAM in maternity admission Unit.  Impression:  Intrauterine pregnancy at 8 weeks and 3 days  First trimester bleeding with accident intrauterine viable pregnancy  Bipolar disorder, currently under treatment with Latuda, a category B drug.  History of tubal sterilization  Recommendation:  New obstetrical interview and examination scheduled for October 25, 2011  Prenatal vitamin given

## 2011-10-02 NOTE — MAU Provider Note (Signed)
Attestation of Attending Supervision of Advanced Practitioner (CNM/NP): Evaluation and management procedures were performed by the Advanced Practitioner under my supervision and collaboration.  I have reviewed the Advanced Practitioner's note and chart, and I agree with the management and plan.  HARRAWAY-SMITH, Thaddius Manes 2:48 PM     

## 2011-10-12 ENCOUNTER — Encounter: Payer: Self-pay | Admitting: Obstetrics and Gynecology

## 2011-10-12 NOTE — Progress Notes (Signed)
REquest for dental letter received. EDC documented and dental letter faxed by  Sue Lush.

## 2011-10-13 ENCOUNTER — Ambulatory Visit (INDEPENDENT_AMBULATORY_CARE_PROVIDER_SITE_OTHER): Payer: Medicaid Other

## 2011-10-13 ENCOUNTER — Other Ambulatory Visit: Payer: Self-pay | Admitting: Obstetrics and Gynecology

## 2011-10-13 ENCOUNTER — Ambulatory Visit (INDEPENDENT_AMBULATORY_CARE_PROVIDER_SITE_OTHER): Payer: Medicaid Other | Admitting: Obstetrics and Gynecology

## 2011-10-13 ENCOUNTER — Other Ambulatory Visit: Payer: Self-pay

## 2011-10-13 DIAGNOSIS — O200 Threatened abortion: Secondary | ICD-10-CM

## 2011-10-13 DIAGNOSIS — Z332 Encounter for elective termination of pregnancy: Secondary | ICD-10-CM

## 2011-10-13 DIAGNOSIS — O039 Complete or unspecified spontaneous abortion without complication: Secondary | ICD-10-CM
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
## 2011-10-13 NOTE — Progress Notes (Signed)
[redacted]w[redacted]d S: Presented with 1st Trimester bleeding. O: USS for Viability of IUP     USS: 7w 0d SAB , No FHTs seen, Irregular gestational sac seen, Normal ovaries, no fluid in CDS, Normal Adnexas.     Patient very distressed by the USS findings.     Offered Conservative Management, Medical Management or D&E.     Patient wants to be proactive and have D&E     Stated that she has been drinking alcohol in the 1st Trimester (Hx of Alcohol Abuse) She blames herself for the demise. A: 1st Trimester SAB associated with PV Bleeding. P. Organized admission to [facility] Friday 2nd August 10.45 hrs for D&E (Dr Su Hilt)     Patient has been instructed to fast from Midnight  On Thursday night as Prep for Surgery on Friday.     Patient states that she understands the procedure and the instructions given today.     Earl Gala, CNM.

## 2011-10-14 ENCOUNTER — Encounter (HOSPITAL_COMMUNITY): Payer: Self-pay | Admitting: Anesthesiology

## 2011-10-14 ENCOUNTER — Inpatient Hospital Stay (HOSPITAL_COMMUNITY): Payer: Medicaid Other | Admitting: Anesthesiology

## 2011-10-14 ENCOUNTER — Other Ambulatory Visit: Payer: Self-pay | Admitting: Obstetrics and Gynecology

## 2011-10-14 ENCOUNTER — Encounter (HOSPITAL_COMMUNITY)
Admission: AD | Disposition: A | Discharge status: Home / Self Care | Payer: Self-pay | Source: Ambulatory Visit | Attending: Obstetrics and Gynecology

## 2011-10-14 ENCOUNTER — Ambulatory Visit: Payer: Self-pay | Admitting: Family Medicine

## 2011-10-14 ENCOUNTER — Telehealth: Payer: Self-pay | Admitting: Obstetrics and Gynecology

## 2011-10-14 ENCOUNTER — Ambulatory Visit (HOSPITAL_COMMUNITY)
Admission: AD | Admit: 2011-10-14 | Discharge: 2011-10-14 | Disposition: A | Discharge status: Home / Self Care | Payer: Medicaid Other | Source: Ambulatory Visit | Attending: Obstetrics and Gynecology | Admitting: Obstetrics and Gynecology

## 2011-10-14 ENCOUNTER — Encounter (HOSPITAL_COMMUNITY): Payer: Self-pay | Admitting: *Deleted

## 2011-10-14 ENCOUNTER — Inpatient Hospital Stay (HOSPITAL_COMMUNITY): Payer: Medicaid Other

## 2011-10-14 ENCOUNTER — Ambulatory Visit (HOSPITAL_COMMUNITY)
Admission: RE | Admit: 2011-10-14 | Payer: Medicaid Other | Source: Ambulatory Visit | Admitting: Obstetrics and Gynecology

## 2011-10-14 DIAGNOSIS — O021 Missed abortion: Principal | ICD-10-CM | POA: Insufficient documentation

## 2011-10-14 DIAGNOSIS — O034 Incomplete spontaneous abortion without complication: Secondary | ICD-10-CM

## 2011-10-14 HISTORY — PX: DILATION AND EVACUATION: SHX1459

## 2011-10-14 LAB — HCG, QUANTITATIVE, PREGNANCY: hCG, Beta Chain, Quant, S: 6344 m[IU]/mL — ABNORMAL HIGH (ref <5)

## 2011-10-14 LAB — CBC
Platelets: 219 10*3/uL (ref 150–400)
RDW: 12.7 % (ref 11.5–15.5)
WBC: 6.5 10*3/uL (ref 4.0–10.5)

## 2011-10-14 SURGERY — DILATION AND EVACUATION, UTERUS
Anesthesia: Monitor Anesthesia Care | Site: Vagina | Wound class: Clean Contaminated

## 2011-10-14 MED ORDER — ONDANSETRON HCL 4 MG/2ML IJ SOLN
INTRAMUSCULAR | Status: DC | PRN
  Administered 2011-10-14: 4 mg via INTRAVENOUS

## 2011-10-14 MED ORDER — ONDANSETRON HCL 4 MG/2ML IJ SOLN: 4.0000 mg | Freq: Once | INTRAMUSCULAR | Status: DC | PRN

## 2011-10-14 MED ORDER — LIDOCAINE HCL (CARDIAC) 20 MG/ML IV SOLN
INTRAVENOUS | Status: DC | PRN
  Administered 2011-10-14: 50 mg via INTRAVENOUS

## 2011-10-14 MED ORDER — FENTANYL CITRATE 0.05 MG/ML IJ SOLN
INTRAMUSCULAR | Status: AC
  Administered 2011-10-14: 50 ug via INTRAVENOUS
  Filled 2011-10-14: qty 2

## 2011-10-14 MED ORDER — PROPOFOL 10 MG/ML IV EMUL
INTRAVENOUS | Status: DC | PRN
  Administered 2011-10-14: 300 ug/kg/min via INTRAVENOUS

## 2011-10-14 MED ORDER — DOXYCYCLINE HYCLATE 50 MG PO CAPS: 100.0000 mg | ORAL_CAPSULE | Freq: Two times a day (BID) | ORAL | Status: AC

## 2011-10-14 MED ORDER — MEDROXYPROGESTERONE ACETATE 150 MG/ML IM SUSP
INTRAMUSCULAR | Status: AC
  Administered 2011-10-14: 150 mg via INTRAMUSCULAR
  Filled 2011-10-14: qty 1

## 2011-10-14 MED ORDER — DEXAMETHASONE SODIUM PHOSPHATE 10 MG/ML IJ SOLN
INTRAMUSCULAR | Status: DC | PRN
  Administered 2011-10-14: 10 mg via INTRAVENOUS

## 2011-10-14 MED ORDER — MIDAZOLAM HCL 5 MG/5ML IJ SOLN
INTRAMUSCULAR | Status: DC | PRN
  Administered 2011-10-14: 2 mg via INTRAVENOUS

## 2011-10-14 MED ORDER — CITRIC ACID-SODIUM CITRATE 334-500 MG/5ML PO SOLN
30.0000 mL | Freq: Once | ORAL | Status: AC
  Administered 2011-10-14: 30 mL via ORAL
  Filled 2011-10-14: qty 15

## 2011-10-14 MED ORDER — MEDROXYPROGESTERONE ACETATE 150 MG/ML IM SUSP
150.0000 mg | Freq: Once | INTRAMUSCULAR | Status: AC
  Administered 2011-10-14: 150 mg via INTRAMUSCULAR

## 2011-10-14 MED ORDER — TRAMADOL HCL 50 MG PO TABS
50.0000 mg | ORAL_TABLET | Freq: Once | ORAL | Status: AC
  Administered 2011-10-14: 50 mg via ORAL
  Filled 2011-10-14: qty 1

## 2011-10-14 MED ORDER — KETOROLAC TROMETHAMINE 30 MG/ML IJ SOLN
INTRAMUSCULAR | Status: DC | PRN
  Administered 2011-10-14: 30 mg via INTRAVENOUS

## 2011-10-14 MED ORDER — KETOROLAC TROMETHAMINE 30 MG/ML IJ SOLN: 30.0000 mg | Freq: Once | INTRAMUSCULAR | Status: DC

## 2011-10-14 MED ORDER — METHYLERGONOVINE MALEATE 0.2 MG PO TABS
0.2000 mg | ORAL_TABLET | Freq: Four times a day (QID) | ORAL | Status: DC
Start: 2011-10-14 — End: 2011-12-14

## 2011-10-14 MED ORDER — FENTANYL CITRATE 0.05 MG/ML IJ SOLN
INTRAMUSCULAR | Status: DC | PRN
  Administered 2011-10-14: 100 ug via INTRAVENOUS

## 2011-10-14 MED ORDER — KETOROLAC TROMETHAMINE 30 MG/ML IJ SOLN: 15.0000 mg | Freq: Once | INTRAMUSCULAR | Status: DC | PRN

## 2011-10-14 MED ORDER — BUTORPHANOL TARTRATE 1 MG/ML IJ SOLN
1.0000 mg | INTRAMUSCULAR | Status: DC | PRN
  Administered 2011-10-14 (×2): 1 mg via INTRAVENOUS
  Filled 2011-10-14 (×2): qty 1

## 2011-10-14 MED ORDER — FENTANYL CITRATE 0.05 MG/ML IJ SOLN
25.0000 ug | INTRAMUSCULAR | Status: DC | PRN
  Administered 2011-10-14 (×2): 50 ug via INTRAVENOUS

## 2011-10-14 MED ORDER — MEPERIDINE HCL 25 MG/ML IJ SOLN: 6.2500 mg | INTRAMUSCULAR | Status: DC | PRN

## 2011-10-14 MED ORDER — LIDOCAINE HCL 2 % IJ SOLN
INTRAMUSCULAR | Status: DC | PRN
  Administered 2011-10-14: 10 mL

## 2011-10-14 MED ORDER — LACTATED RINGERS IV SOLN
INTRAVENOUS | Status: DC
  Administered 2011-10-14: 12:00:00 via INTRAVENOUS

## 2011-10-14 MED ORDER — IBUPROFEN 600 MG PO TABS: 600.0000 mg | ORAL_TABLET | Freq: Four times a day (QID) | ORAL | Status: AC | PRN

## 2011-10-14 MED ORDER — FAMOTIDINE IN NACL 20-0.9 MG/50ML-% IV SOLN
20.0000 mg | Freq: Once | INTRAVENOUS | Status: AC
  Administered 2011-10-14: 20 mg via INTRAVENOUS
  Filled 2011-10-14: qty 50

## 2011-10-14 SURGICAL SUPPLY — 19 items
CATH ROBINSON RED A/P 16FR (CATHETERS) ×2 IMPLANT
CLOTH BEACON ORANGE TIMEOUT ST (SAFETY) ×2 IMPLANT
DECANTER SPIKE VIAL GLASS SM (MISCELLANEOUS) ×2 IMPLANT
DILATOR CANAL MILEX (MISCELLANEOUS) IMPLANT
GLOVE SURG SS PI 6.5 STRL IVOR (GLOVE) ×4 IMPLANT
GOWN PREVENTION PLUS LG XLONG (DISPOSABLE) ×2 IMPLANT
KIT BERKELEY 1ST TRIMESTER 3/8 (MISCELLANEOUS) ×2 IMPLANT
NDL SPNL 22GX3.5 QUINCKE BK (NEEDLE) ×1 IMPLANT
NEEDLE SPNL 22GX3.5 QUINCKE BK (NEEDLE) ×2 IMPLANT
NS IRRIG 1000ML POUR BTL (IV SOLUTION) ×2 IMPLANT
PACK VAGINAL MINOR WOMEN LF (CUSTOM PROCEDURE TRAY) ×2 IMPLANT
PAD PREP 24X48 CUFFED NSTRL (MISCELLANEOUS) ×2 IMPLANT
SET BERKELEY SUCTION TUBING (SUCTIONS) ×2 IMPLANT
SYR CONTROL 10ML LL (SYRINGE) ×2 IMPLANT
TOWEL OR 17X24 6PK STRL BLUE (TOWEL DISPOSABLE) ×4 IMPLANT
VACURETTE 10 RIGID CVD (CANNULA) IMPLANT
VACURETTE 7MM CVD STRL WRAP (CANNULA) ×1 IMPLANT
VACURETTE 8 RIGID CVD (CANNULA) IMPLANT
VACURETTE 9 RIGID CVD (CANNULA) IMPLANT

## 2011-10-14 NOTE — Transfer of Care (Signed)
Immediate Anesthesia Transfer of Care Note  Patient: [name]  Procedure(s) Performed: Procedure(s) (LRB): DILATATION AND EVACUATION (N/A)  Patient Location: PACU  Anesthesia Type: MAC  Level of Consciousness: awake, alert  and oriented  Airway & Oxygen Therapy: Patient Spontanous Breathing  Post-op Assessment: Report given to PACU RN and Post -op Vital signs reviewed and stable  Post vital signs: stable  Complications: No apparent anesthesia complications

## 2011-10-14 NOTE — Op Note (Addendum)
10/14/2011  5:33 PM  PATIENT:  [name]  31 y.o. female  PRE-OPERATIVE DIAGNOSIS:  missed ab  POST-OPERATIVE DIAGNOSIS:  missed ab  PROCEDURE:  Procedure(s): DILATATION AND EVACUATION  SURGEON:  Surgeon(s): Hal Morales, MD  ASSISTANTS: none   ANESTHESIA:   local and MAC  ESTIMATED BLOOD LOSS: minimal   COMPLICATIONS: none  FINDINGS:uterus 6-8 wks size  BLOOD ADMINISTERED:none  LOCAL MEDICATIONS USED:  LIDOCAINE   SPECIMEN:  Source of Specimen:  POC's  DISPOSITION OF SPECIMEN:  PATHOLOGY  COUNTS:  YES  DESCRIPTION OF PROCEDURE:  The patient was taken the operating room after appropriate identification placed on the operating table. After the attainment of adequate anesthesia she was placed in the lithotomy position. Perineum and vagina were prepped with multiple areas of Betadine and a straight in and out catheter used to empty the bladder. The perineum was draped as a sterile field. A weighted speculum was placed in the posterior vagina and a paracervical block achieved a total of 10 cc of 2% Xylocaine and the 5 and 7:00 positions. A single-tooth tenaculum was placed on the anterior cervix and the cervix was noted to be  dilated enough to accommodate a number 7 suction catheter. The suction catheter was then used to suction evacuate all quadrants of the uterus. A sharp curet was used to ensure that all products of conception had been removed. All instruments were then removed from the vagina and the patient had her anesthetic reversed and was taken to the recovery room in satisfactory condition having tolerated the procedure well sponge and instrument counts correct.  PLAN OF CARE: D/C  home  PATIENT DISPOSITION:  PACU - hemodynamically stable.   Delay start of Pharmacological VTE agent (>24hrs) due to surgical blood loss or risk of bleeding:  not applicable as SCDs were used  Blood type:  O neg.  Pt received Rhophylac 09/22/11    Hal Morales, MD 5:33  PM

## 2011-10-14 NOTE — Anesthesia Postprocedure Evaluation (Signed)
Anesthesia Post Note  Patient: [name]  Procedure(s) Performed: Procedure(s) (LRB): DILATATION AND EVACUATION (N/A)  Anesthesia type: MAC  Patient location: PACU  Post pain: Pain level controlled  Post assessment: Post-op Vital signs reviewed  Last Vitals:  Filed Vitals:   10/14/11 1737  BP: 112/75  Pulse: 78  Temp: 36.6 C  Resp: 20    Post vital signs: Reviewed  Level of consciousness: sedated  Complications: No apparent anesthesia complications

## 2011-10-14 NOTE — MAU Note (Signed)
Pt reports she was seen in office yesterday, and scheduled for [facility] tomorrow but started bleeding heavily at 0500, and began having worsening cramping then also
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
## 2011-10-14 NOTE — H&P (Signed)
  I have discussed the intended D&E procedure as well as the risks of anesthesia, bleeding, infection and damage to adjacent organs with the patient and she wishes to proceed.

## 2011-10-14 NOTE — Telephone Encounter (Signed)
D&E scheduled for 10/15/11 @ 12:15 with AR. Patient has MCD. -[name]

## 2011-10-14 NOTE — Telephone Encounter (Signed)
D&E scheduled for 10/14/11 @ 4:30p with [facility]. Patient has MCD. -Adrianne Pridgen

## 2011-10-14 NOTE — Anesthesia Preprocedure Evaluation (Signed)
Anesthesia Evaluation  Patient identified by MRN, date of birth, ID band Patient awake    Reviewed: Allergy & Precautions, H&P , NPO status , Patient's Chart, lab work & pertinent test results  Airway Mallampati: I TM Distance: >3 FB Neck ROM: full    Dental No notable dental hx. (+) Teeth Intact   Pulmonary asthma ,    Pulmonary exam normal       Cardiovascular     Neuro/Psych Depression    GI/Hepatic Neg liver ROS,   Endo/Other  negative endocrine ROS  Renal/GU negative Renal ROS  negative genitourinary   Musculoskeletal negative musculoskeletal ROS (+)   Abdominal Normal abdominal exam  (+)   Peds negative pediatric ROS (+)  Hematology negative hematology ROS (+)   Anesthesia Other Findings   Reproductive/Obstetrics negative OB ROS                           Anesthesia Physical Anesthesia Plan  ASA: II  Anesthesia Plan: MAC   Post-op Pain Management:    Induction: Intravenous  Airway Management Planned:   Additional Equipment:   Intra-op Plan:   Post-operative Plan:   Informed Consent: I have reviewed the patients History and Physical, chart, labs and discussed the procedure including the risks, benefits and alternatives for the proposed anesthesia with the patient or authorized representative who has indicated his/her understanding and acceptance.     Plan Discussed with: CRNA and Surgeon  Anesthesia Plan Comments:         Anesthesia Quick Evaluation

## 2011-10-15 ENCOUNTER — Encounter (HOSPITAL_COMMUNITY): Payer: Self-pay | Admitting: Obstetrics and Gynecology

## 2011-10-18 ENCOUNTER — Encounter: Payer: Medicaid Other | Admitting: Obstetrics and Gynecology

## 2011-10-25 ENCOUNTER — Encounter: Payer: Medicaid Other | Admitting: Obstetrics and Gynecology

## 2011-10-28 ENCOUNTER — Encounter: Payer: Self-pay | Admitting: Obstetrics and Gynecology

## 2011-10-28 ENCOUNTER — Ambulatory Visit (INDEPENDENT_AMBULATORY_CARE_PROVIDER_SITE_OTHER): Payer: Medicaid Other | Admitting: Obstetrics and Gynecology

## 2011-10-28 VITALS — BP 110/68 | Temp 98.7°F | Ht 66.0 in | Wt 167.0 lb

## 2011-10-28 DIAGNOSIS — O039 Complete or unspecified spontaneous abortion without complication: Secondary | ICD-10-CM

## 2011-10-28 NOTE — Progress Notes (Signed)
  Subjective:   Post Op: DATE OF SURGERY: 10/14/2011 TYPE OF SURGERY: D&E PAIN:Yes  cramping VAG BLEEDING: yes stopped bleeding 3 days ago. VAG DISCHARGE: yes brownish with no door NORMAL GI FUNCTN: yes NORMAL GU FUNCTN: yes Pt c/o feeling nauseas. With some vomiting   Pathology: Products of Conception PRODUCTS OF CONCEPTION Microscopic Comment There are immature chorionic villi present without evidence of hydatidiform change. Gwendolyn Grant LI MD Pathologist, Electronic Signature (Case signed 10/18/2011) Specimen Gross and Clinical Information Specimen(s) Obtained: Products of Conception Specimen Clinical Information missed abortion [jl] Gross Received in fixative is a 6.2 x 5 x 1.6 cm aggregate of tan pink to red, soft, fibromembranous and papillary tissue consistent with villi, with dark red soft material consistent with blood clot. Fetal parts are no identified. No cystic structures are associated with the villi. Representative sections are submitted in one block. (SSW:caf 10/15/11)   [name] is a 31 y.o. female who presents for post-op visit.  Pathology report:  was reviewed with patient.  The following portions of the patient's history were reviewed and updated as appropriate: allergies, current medications, past family history, past medical history, past social history, past surgical history and problem list.  Review of Systems Pertinent items are noted in HPI.   Objective:    BP 110/68  Ht 5\' 6"  (1.676 m)  Wt 167 lb (75.751 kg)  BMI 26.95 kg/m2  LMP 07/05/2011  Breastfeeding? Unknown Weight:  Wt Readings from Last 1 Encounters:  10/28/11 167 lb (75.751 kg)    BMI: Body mass index is 26.95 kg/(m^2).  General Appearance: Alert, appropriate appearance for age. No acute distress Lungs: clear to auscultation bilaterally Back: No CVA tenderness Cardiovascular: Regular rate and rhythm. S1, S2, no murmur Gastrointestinal: Soft, non-tender, no masses or  organomegaly Incision/s: n/a Pelvic Exam: EGBUS nl   Bimanual exam normal, nontender  Assessment:    Doing well postoperatively. s/p D&C for incomplete spontaneous abortion Pregnancy status post tubal ligation Operative findings again reviewed.   Plan:   Patient was treated with Depo-Provera 150 mg IM immediately postoperatively.  She will decide the for 3 months whether she wants to have a repeat tubal ligation or more definitive surgery as she has difficulty with severe cramps with her period  Continue any current medications.  Activity restrictions: none  Anticipated return to work: now.  Follow up: 2 months   Severa Jeremiah MD 8/15/20133:55 PM

## 2011-11-16 ENCOUNTER — Encounter: Payer: Medicaid Other | Admitting: Obstetrics and Gynecology

## 2011-11-16 ENCOUNTER — Telehealth: Payer: Self-pay | Admitting: Family Medicine

## 2011-11-16 NOTE — Telephone Encounter (Signed)
Wants to know if she can come in sooner than 9/30 - need permission to double book

## 2011-11-24 LAB — US OB TRANSVAGINAL

## 2011-11-29 ENCOUNTER — Ambulatory Visit: Payer: Self-pay | Admitting: Family Medicine

## 2011-12-08 ENCOUNTER — Ambulatory Visit: Payer: Self-pay | Admitting: Family Medicine

## 2011-12-09 ENCOUNTER — Encounter: Payer: Medicaid Other | Admitting: Obstetrics and Gynecology

## 2011-12-13 ENCOUNTER — Ambulatory Visit: Payer: Self-pay | Admitting: Family Medicine

## 2011-12-14 ENCOUNTER — Ambulatory Visit (INDEPENDENT_AMBULATORY_CARE_PROVIDER_SITE_OTHER): Payer: Medicaid Other | Admitting: Family Medicine

## 2011-12-14 ENCOUNTER — Encounter: Payer: Self-pay | Admitting: Family Medicine

## 2011-12-14 VITALS — BP 125/86 | HR 97 | Temp 99.0°F | Wt 182.0 lb

## 2011-12-14 DIAGNOSIS — M25511 Pain in right shoulder: Secondary | ICD-10-CM

## 2011-12-14 DIAGNOSIS — G8929 Other chronic pain: Secondary | ICD-10-CM

## 2011-12-14 DIAGNOSIS — M25519 Pain in unspecified shoulder: Secondary | ICD-10-CM

## 2011-12-14 HISTORY — DX: Other chronic pain: G89.29

## 2011-12-14 MED ORDER — DICLOFENAC SODIUM 1 % TD GEL: 2.0000 g | Freq: Four times a day (QID) | TRANSDERMAL | Status: DC

## 2011-12-14 NOTE — Patient Instructions (Addendum)
I am sorry your shoulder is hurting so badly.  Please ice your shoulder tonight.  I have sent in a prescription for Voltaren gel- it is a form of anti-inflammatory that will not hurt your stomach.  Please also ask your mental health provider if Tramadol would be safe with your mental health medications as that medication may be beneficial to you.   Please ask the front desk to schedule an appointment at Sports Medicine for shoulder pain.

## 2011-12-14 NOTE — Assessment & Plan Note (Signed)
Patient with severe pain on exam today, difficult to assess rotator cuff strength- I am concerned about a rotator cuff tear and she may be developing frozen shoulder.   - Corticosteroid injection today - Rx for Voltaren gel as she cannot tolerate NSAIDS - Patient to ask Psychiatrist if tramadol OK with other medications - Pt to f/u at [facility] for further evaluation.

## 2011-12-14 NOTE — Progress Notes (Signed)
  Subjective:    Patient ID: [name], female    DOB: 1980-10-24, 31 y.o.   MRN: [id number]  HPI  [name] comes in with severe right shoulder pain.  She has had shoulder pain for about 2 years, and Dr. McDiarmid has given her a few shoulder injections, she says the last one was about 6 months ago.  She says the pain is aching at night, keeping her up.  She says it limits her activities and makes it difficult to care for her children.  She is not taking any anti-inflammatories because of her acid reflux and stomach problems.  Also, she has Bipolar disorder, and takes several medications for that. She is unsure of the doses, but she takes Elavil, Lithium, Zyprexa for her Bipolar.   Review of Systems See HPI    Objective:   Physical Exam  BP 125/86  Pulse 97  Temp 99 F (37.2 C) (Oral)  Wt 182 lb (82.555 kg)  LMP 07/05/2011 General appearance: alert, cooperative and no distress R Shoulder: Inspection reveals no abnormalities, atrophy or asymmetry. Palpation reveals diffuse tenderness over AC joint, bicipital groove, and humeral head. Active ROM is limited to about 100 degrees in lateral raise, 90 degrees in forward raise, but IR is normal, pt can reach L 3 Passive ROM of lateral and forward raise only improved by about 10 degrees each Rotator cuff strength difficult to test due to severe patient discomfort.  + Neer and Hawkin's tests, empty can.  R Shoulder Injection: Consent obtained and verified. Sterile betadine prep. Furthur cleansed with alcohol. Topical analgesic spray: Ethyl chloride. Joint: R shoulder Approached in typical fashion with: Posterior approach Completed without difficulty Meds: 1cc 40% DepoMedrol, 4 cc 1% lidocaine Needle: 21 G 1 1/2 in Aftercare instructions and Red flags advised.       Assessment & Plan:

## 2011-12-15 ENCOUNTER — Telehealth: Payer: Self-pay | Admitting: Family Medicine

## 2011-12-15 NOTE — Telephone Encounter (Signed)
Pt states that medicaid will not cover the diclofenac - needs to know what to do [facility] [address]

## 2011-12-15 NOTE — Telephone Encounter (Signed)
Patient informed I will route note to Dr. Lula Olszewski.  Will call patient back tomorrow.  Gaylene Brooks, RN

## 2011-12-16 NOTE — Telephone Encounter (Signed)
Patient informed and will pick med up from pharmacy.  Gaylene Brooks, RN

## 2011-12-16 NOTE — Telephone Encounter (Signed)
Called and spoke with Romeo Apple at the Pharmacy, he took a look, is not sure why it did not go through yesterday- it went through today, will be $3 copay, please notify patient.

## 2011-12-20 ENCOUNTER — Encounter: Payer: Self-pay | Admitting: Sports Medicine

## 2011-12-20 ENCOUNTER — Ambulatory Visit (INDEPENDENT_AMBULATORY_CARE_PROVIDER_SITE_OTHER): Payer: Medicaid Other | Admitting: Sports Medicine

## 2011-12-20 VITALS — BP 129/89 | HR 76 | Ht 66.0 in | Wt 182.0 lb

## 2011-12-20 DIAGNOSIS — M25519 Pain in unspecified shoulder: Secondary | ICD-10-CM

## 2011-12-20 DIAGNOSIS — M67919 Unspecified disorder of synovium and tendon, unspecified shoulder: Secondary | ICD-10-CM

## 2011-12-20 MED ORDER — NITROGLYCERIN 0.2 MG/HR TD PT24: MEDICATED_PATCH | TRANSDERMAL | Status: DC

## 2011-12-20 NOTE — Progress Notes (Signed)
  Subjective:    Patient ID: [name], female    DOB: [birth year], 31 y.o.   MRN: [id number]  HPI chief complaint: Right shoulder pain  31 year old comes in today complaining of chronic right shoulder pain. Pain is been present now for 2-1/2 years. Only injury she can recall is a fall at work 6 years ago which did lead to some right shoulder pain but that resolved with physical therapy. She was completely pain-free up until 2 a half years ago. She most recently saw her PCP who did a subacromial cortisone injection but despite that her pain persists. Pain is diffuse in the shoulder. Worse with reaching overhead or with from her body. Symptoms improve with the arm at rest by her side. She does have some weakness in her right arm secondary to pain. No prior shoulder surgeries. Pain awakens her at night. She's been prescribed Voltaren gel because she is unable to take NSAIDs. Voltaren gel has not been helpful.  Her past medical history and medications are available for review. Medical history is significant for bipolar disease, hypertension, and reflux disease. No known drug allergies   Review of Systems     Objective:   Physical Exam Well-developed, well-nourished. No acute distress. Awake alert and oriented x3  Right shoulder: Normal to inspection. She has limited range of motion in all planes both actively and passively. Markedly positive painful ARC. Positive empty can, positive Hawkins. Rotator cuff is globally weak secondary to pain. She is tender to palpation over the bicipital groove. She is neurovascularly intact distally.  MSK ultrasound of the right shoulder: Patient has an area of hypoechogenicity at the insertion of the subscapularis concerning for a partial thickness tear here. There is also calcification and hypoechogenicity of the supraspinatus at its insertion which suggest a tear here as well. Visualized posterior labrum was within normal limits. A.c. joint was within normal  limits. Biceps tendon is intact. Please note that the scan was incomplete do to patient's level of pain. I was only able to obtain images in the longitudinal plane and was not able to perform any impingement testing.       Assessment & Plan:  1. Chronic right shoulder pain with ultrasound evidence of probable partial thickness rotator cuff tear  Were going to try the topical nitroglycerin protocol. Quarter patch daily. She has already been instructed in range of motion exercises and she will continue with these. Her symptoms are chronic enough and debilitating enough that if she doesn't show any initial improvement with our topical nitroglycerin protocol, I would consider plain x-rays and potentially an MRI for presurgical planning, followup with me in 4 weeks for reevaluation.

## 2011-12-20 NOTE — Patient Instructions (Addendum)
Nitroglycerin Protocol   Apply 1/4 nitroglycerin patch to affected area daily.  Change position of patch within the affected area every 24 hours.  You may experience a headache during the first 1-2 weeks of using the patch, these should subside.  If you experience headaches after beginning nitroglycerin patch treatment, you may take your preferred over the counter pain reliever.  Another side effect of the nitroglycerin patch is skin irritation or rash related to patch adhesive.  Please notify our office if you develop more severe headaches or rash, and stop the patch.  Tendon healing with nitroglycerin patch may require 12 to 24 weeks depending on the extent of injury.  Men should not use if taking Viagra, Cialis, or Levitra.   Do not use if you have migraines or rosacea.   Please follow up in 4 weeks  Thank you for seeing us today!  

## 2011-12-27 ENCOUNTER — Encounter: Payer: Self-pay | Admitting: Family Medicine

## 2011-12-27 NOTE — Patient Instructions (Signed)
Your blood pressure is under good control We are checking your electrolytes today, including your potassium

## 2011-12-28 ENCOUNTER — Telehealth: Payer: Self-pay | Admitting: Family Medicine

## 2011-12-28 NOTE — Progress Notes (Signed)
This encounter was created in error - please disregard.  This encounter was created in error - please disregard.

## 2011-12-28 NOTE — Telephone Encounter (Signed)
Ms. Gosser need to see you before you leave for your trip back to [place].  Need you to call her to inform when she can come.

## 2012-01-02 ENCOUNTER — Encounter (HOSPITAL_COMMUNITY): Payer: Self-pay | Admitting: Emergency Medicine

## 2012-01-02 ENCOUNTER — Emergency Department (INDEPENDENT_AMBULATORY_CARE_PROVIDER_SITE_OTHER)
Admission: EM | Admit: 2012-01-02 | Discharge: 2012-01-02 | Disposition: A | Discharge status: Home / Self Care | Payer: Medicaid Other | Source: Home / Self Care

## 2012-01-02 DIAGNOSIS — M25519 Pain in unspecified shoulder: Secondary | ICD-10-CM

## 2012-01-02 DIAGNOSIS — M25511 Pain in right shoulder: Secondary | ICD-10-CM

## 2012-01-02 MED ORDER — TRIAMCINOLONE ACETONIDE 40 MG/ML IJ SUSP
INTRAMUSCULAR | Status: AC
  Filled 2012-01-02: qty 5

## 2012-01-02 MED ORDER — TRIAMCINOLONE ACETONIDE 40 MG/ML IJ SUSP
40.0000 mg | Freq: Once | INTRAMUSCULAR | Status: AC
  Administered 2012-01-02: 40 mg via INTRAMUSCULAR
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
## 2012-01-02 NOTE — ED Notes (Signed)
Right shoulder pain, intensified last night.  Patient is a patient of family medicine and sports medicine.  Currently being treated by sports medicine.

## 2012-01-02 NOTE — ED Provider Notes (Signed)
Medical screening examination/treatment/procedure(s) were performed by non-physician practitioner and as supervising physician I was immediately available for consultation/collaboration.  Leslee Home, M.D.   Reuben Likes, MD 01/02/12 2128

## 2012-01-03 ENCOUNTER — Ambulatory Visit (INDEPENDENT_AMBULATORY_CARE_PROVIDER_SITE_OTHER): Payer: Medicaid Other | Admitting: Family Medicine

## 2012-01-03 VITALS — BP 136/93 | Ht 66.0 in | Wt 178.0 lb

## 2012-01-03 DIAGNOSIS — M25511 Pain in right shoulder: Secondary | ICD-10-CM

## 2012-01-03 DIAGNOSIS — M25519 Pain in unspecified shoulder: Secondary | ICD-10-CM

## 2012-01-03 MED ORDER — TRAMADOL HCL 50 MG PO TABS: 50.0000 mg | ORAL_TABLET | Freq: Four times a day (QID) | ORAL | Status: DC | PRN

## 2012-01-03 NOTE — Patient Instructions (Addendum)
[facility] MRI R SHOULDER 01/04/12 AT 9AM ARRIVE AT 830 FOR REGISTRATION AT ADMITTING ON THE 1ST FLOOR [phone]

## 2012-01-04 ENCOUNTER — Ambulatory Visit (HOSPITAL_COMMUNITY)
Admission: RE | Admit: 2012-01-04 | Discharge: 2012-01-04 | Disposition: A | Discharge status: Home / Self Care | Payer: Medicaid Other | Source: Ambulatory Visit | Attending: Family Medicine | Admitting: Family Medicine

## 2012-01-04 ENCOUNTER — Telehealth: Payer: Self-pay | Admitting: Family Medicine

## 2012-01-04 DIAGNOSIS — M25519 Pain in unspecified shoulder: Secondary | ICD-10-CM | POA: Insufficient documentation

## 2012-01-04 DIAGNOSIS — M25511 Pain in right shoulder: Secondary | ICD-10-CM

## 2012-01-04 DIAGNOSIS — M719 Bursopathy, unspecified: Secondary | ICD-10-CM | POA: Insufficient documentation

## 2012-01-04 DIAGNOSIS — M67919 Unspecified disorder of synovium and tendon, unspecified shoulder: Secondary | ICD-10-CM | POA: Insufficient documentation

## 2012-01-04 NOTE — Telephone Encounter (Signed)
Called pt- gave her message from Dr. Jennette Kettle.   She would like [place] to set up PT.   Order entered.

## 2012-01-04 NOTE — Telephone Encounter (Signed)
Amy plz call her and tell her  That the MRI shows NO ROTATOR cuff tears---this is great news.!!!!  I would recommend physical therapy---we can set that up OR her PCP can. I gave her some tramadol yesterday---that should be all she needs and I will NOT be refilling it--she needs to follow up w her PCP for that. Alternatively she can follow up with one of [place] for a steroid injection---she saw Dr Margaretha Sheffield initially. THANKS! THANKS! THANKS! Denny Levy

## 2012-01-04 NOTE — Progress Notes (Signed)
  Subjective:    Patient ID: [name], female    DOB: September 29, 1980, 31 y.o.   MRN: [id number]  HPI  Right shoulder pain. Was seen here previously and had ultrasound suspicious for rotator cuff tear. She is back today complaining of worsening pain or she can barely use her shoulder. She wants to have an MRI to get this firmly evaluated and possibly contemplate surgery. Pain is now 8-10 out of 10 most of the time, worse at night.  Review of Systems Denies numbness or tingling in the right upper extremity.    Objective:   Physical Exam Vital signs are reviewed GENERAL: Well-developed female no acute distress SHOULDER: Right. No defect. The shoulders are symmetrical. The bicep tendon is nontender to palpation. The deltoid area is generalized but tender. She refuses to elevate her right arm past 90 in lateral abduction. She also refuses to place her arm in full internal rotation secondary to pain.       Assessment & Plan:  #1. Shoulder pain. She really wants to proceed with MRI. Given her ultrasound readings per Dr. Cephus Shelling office visit I think this is reasonable we will set her up for that. I'll give her some tramadol in the interim for pain.

## 2012-01-05 ENCOUNTER — Telehealth: Payer: Self-pay | Admitting: Obstetrics and Gynecology

## 2012-01-05 NOTE — Telephone Encounter (Signed)
Returned pt call. Pt requests appointment with Dr. Pennie Rushing to discuss surgery for possible partial hysterectomy. Advised pt that first available with Dr. Pennie Rushing is 02/15/12 at 8:45am. Gave pt the option to try to get in earlier with another provider, pt only wants to see Dr. Pennie Rushing. When attempting to schedule appointment, pt insurance would not allow me to schedule. Advised pt to contact her pcp listed on her insurance card in order for them to call us to schedule appointment. Pt voiced understanding.

## 2012-01-11 ENCOUNTER — Ambulatory Visit: Payer: Medicaid Other | Attending: Family Medicine | Admitting: Physical Therapy

## 2012-01-11 ENCOUNTER — Ambulatory Visit: Payer: Medicaid Other

## 2012-01-11 DIAGNOSIS — M25619 Stiffness of unspecified shoulder, not elsewhere classified: Secondary | ICD-10-CM | POA: Insufficient documentation

## 2012-01-11 DIAGNOSIS — IMO0001 Reserved for inherently not codable concepts without codable children: Secondary | ICD-10-CM | POA: Insufficient documentation

## 2012-01-11 DIAGNOSIS — R293 Abnormal posture: Secondary | ICD-10-CM | POA: Insufficient documentation

## 2012-01-11 DIAGNOSIS — M25519 Pain in unspecified shoulder: Secondary | ICD-10-CM | POA: Insufficient documentation

## 2012-01-14 ENCOUNTER — Other Ambulatory Visit: Payer: Self-pay | Admitting: *Deleted

## 2012-01-14 ENCOUNTER — Ambulatory Visit (INDEPENDENT_AMBULATORY_CARE_PROVIDER_SITE_OTHER): Payer: Medicaid Other | Admitting: *Deleted

## 2012-01-14 DIAGNOSIS — Z309 Encounter for contraceptive management, unspecified: Secondary | ICD-10-CM

## 2012-01-14 LAB — POCT URINE PREGNANCY: Preg Test, Ur: NEGATIVE

## 2012-01-14 MED ORDER — MEDROXYPROGESTERONE ACETATE 150 MG/ML IM SUSP: 150.0000 mg | INTRAMUSCULAR | Status: DC

## 2012-01-14 MED ORDER — MEDROXYPROGESTERONE ACETATE 150 MG/ML IM SUSP
150.0000 mg | Freq: Once | INTRAMUSCULAR | Status: AC
  Administered 2012-01-14: 150 mg via INTRAMUSCULAR

## 2012-01-14 NOTE — Progress Notes (Signed)
Patient received depo provera at [facility] after D & C following miscarriage she states.  Medication is documented.  She is one day late to receive Depo so urine pregnancy test is done and is negative..  She denies sex in past 2 weeks. Advised to use extra protection for next 7 days.    Consulted with Dr. Deirdre Priest and he advises OK to give depo per protocol today.   Next depo due Jan 17 through Jan 31 , 2014. Reminder card given.

## 2012-01-19 ENCOUNTER — Encounter: Payer: Self-pay | Admitting: Sports Medicine

## 2012-01-19 ENCOUNTER — Ambulatory Visit: Payer: Medicaid Other | Attending: Family Medicine | Admitting: Rehabilitation

## 2012-01-19 ENCOUNTER — Ambulatory Visit (INDEPENDENT_AMBULATORY_CARE_PROVIDER_SITE_OTHER): Payer: Medicaid Other | Admitting: Sports Medicine

## 2012-01-19 VITALS — BP 120/85 | HR 98

## 2012-01-19 DIAGNOSIS — R293 Abnormal posture: Secondary | ICD-10-CM | POA: Insufficient documentation

## 2012-01-19 DIAGNOSIS — M25619 Stiffness of unspecified shoulder, not elsewhere classified: Secondary | ICD-10-CM | POA: Insufficient documentation

## 2012-01-19 DIAGNOSIS — M25519 Pain in unspecified shoulder: Secondary | ICD-10-CM

## 2012-01-19 DIAGNOSIS — IMO0001 Reserved for inherently not codable concepts without codable children: Secondary | ICD-10-CM | POA: Insufficient documentation

## 2012-01-19 MED ORDER — TRAMADOL HCL 50 MG PO TABS: 50.0000 mg | ORAL_TABLET | Freq: Two times a day (BID) | ORAL | Status: DC | PRN

## 2012-01-19 NOTE — Progress Notes (Signed)
  Subjective:    Patient ID: [name], female    DOB: [birth year], 31 y.o.   MRN: [id number]  HPI Patient comes in today with persistent right shoulder pain. Patient last saw Dr. Jennette Kettle who proceeded with an MRI scan of her right shoulder. That MRI is unremarkable. This is despite a previous ultrasound which suggested a possible small tear of the subscapularis tendon. Patient is currently in physical therapy. She continues to struggle with lateral shoulder pain which is worse with activity. She has pain with overhead reaching as well as reaching behind her back. She's had both the subacromial and intramuscular cortisone injections. She tried topical nitroglycerin patches but was unable to tolerate them due to headaches. She is currently using Voltaren and when necessary Ultram which are helpful but have not been curative. She is frustrated with her disability.    Review of Systems     Objective:   Physical Exam Well-developed, well-nourished. No acute distress. Awake alert and oriented x3  Right shoulder: Limited range of motion in all planes secondary to pain. Full passive external rotation. Exam is somewhat limited secondary to pain. She has diffuse tenderness to palpation around the shoulder. Positive signs of impingement. Weakness with rotator cuff stressing secondary to pain. Pain with O'Brien's testing. No muscle atrophy. Neurovascularly intact distally.       Assessment & Plan:  1. Chronic right shoulder pain  Given the chronicity of her problem as well as her failure to improve with conservative treatment, I referred the patient to Dr. Dion Saucier for evaluation. He may want to perform a diagnostic injection prior to considering anything surgical. I've agreed to refill her tramadol and she will take it twice daily as needed. This is a one-time refill. Future refills will need to come from her PCP. She will continue with Voltaren gel. She has 2 visits with physical therapy left with her  Medicaid and I'll defer the decision for more visits to the discretion of Dr. Dion Saucier. Followup when necessary

## 2012-01-20 ENCOUNTER — Encounter (HOSPITAL_BASED_OUTPATIENT_CLINIC_OR_DEPARTMENT_OTHER)
Admission: RE | Admit: 2012-01-20 | Discharge: 2012-01-20 | Disposition: A | Discharge status: Home / Self Care | Payer: Medicaid Other | Source: Ambulatory Visit | Attending: Orthopedic Surgery | Admitting: Orthopedic Surgery

## 2012-01-20 ENCOUNTER — Encounter (HOSPITAL_BASED_OUTPATIENT_CLINIC_OR_DEPARTMENT_OTHER): Payer: Self-pay | Admitting: *Deleted

## 2012-01-20 LAB — COMPREHENSIVE METABOLIC PANEL
AST: 14 U/L (ref 0–37)
BUN: 11 mg/dL (ref 6–23)
CO2: 28 mEq/L (ref 19–32)
Calcium: 9.7 mg/dL (ref 8.4–10.5)
Chloride: 99 mEq/L (ref 96–112)
Creatinine, Ser: 0.91 mg/dL (ref 0.50–1.10)
GFR calc Af Amer: >90 mL/min (ref >90)
GFR calc non Af Amer: 83 mL/min — ABNORMAL LOW (ref >90)
Total Bilirubin: 0.5 mg/dL (ref 0.3–1.2)

## 2012-01-21 ENCOUNTER — Encounter (HOSPITAL_BASED_OUTPATIENT_CLINIC_OR_DEPARTMENT_OTHER): Payer: Self-pay | Admitting: Anesthesiology

## 2012-01-21 ENCOUNTER — Ambulatory Visit (HOSPITAL_BASED_OUTPATIENT_CLINIC_OR_DEPARTMENT_OTHER)
Admission: RE | Admit: 2012-01-21 | Discharge: 2012-01-21 | Disposition: A | Discharge status: Home / Self Care | Payer: Medicaid Other | Source: Ambulatory Visit | Attending: Orthopedic Surgery | Admitting: Orthopedic Surgery

## 2012-01-21 ENCOUNTER — Encounter (HOSPITAL_BASED_OUTPATIENT_CLINIC_OR_DEPARTMENT_OTHER): Payer: Self-pay | Admitting: *Deleted

## 2012-01-21 ENCOUNTER — Encounter (HOSPITAL_BASED_OUTPATIENT_CLINIC_OR_DEPARTMENT_OTHER)
Admission: RE | Disposition: A | Discharge status: Home / Self Care | Payer: Self-pay | Source: Ambulatory Visit | Attending: Orthopedic Surgery

## 2012-01-21 ENCOUNTER — Encounter (HOSPITAL_BASED_OUTPATIENT_CLINIC_OR_DEPARTMENT_OTHER): Payer: Self-pay | Admitting: Orthopedic Surgery

## 2012-01-21 ENCOUNTER — Ambulatory Visit (HOSPITAL_BASED_OUTPATIENT_CLINIC_OR_DEPARTMENT_OTHER): Payer: Medicaid Other | Admitting: Anesthesiology

## 2012-01-21 DIAGNOSIS — J45909 Unspecified asthma, uncomplicated: Secondary | ICD-10-CM | POA: Insufficient documentation

## 2012-01-21 DIAGNOSIS — F17200 Nicotine dependence, unspecified, uncomplicated: Secondary | ICD-10-CM | POA: Insufficient documentation

## 2012-01-21 DIAGNOSIS — Z01810 Encounter for preprocedural cardiovascular examination: Secondary | ICD-10-CM | POA: Insufficient documentation

## 2012-01-21 DIAGNOSIS — I10 Essential (primary) hypertension: Secondary | ICD-10-CM | POA: Insufficient documentation

## 2012-01-21 DIAGNOSIS — F3130 Bipolar disorder, current episode depressed, mild or moderate severity, unspecified: Secondary | ICD-10-CM | POA: Insufficient documentation

## 2012-01-21 DIAGNOSIS — M1990 Unspecified osteoarthritis, unspecified site: Secondary | ICD-10-CM | POA: Insufficient documentation

## 2012-01-21 DIAGNOSIS — M249 Joint derangement, unspecified: Secondary | ICD-10-CM | POA: Insufficient documentation

## 2012-01-21 DIAGNOSIS — F909 Attention-deficit hyperactivity disorder, unspecified type: Secondary | ICD-10-CM | POA: Insufficient documentation

## 2012-01-21 DIAGNOSIS — M7500 Adhesive capsulitis of unspecified shoulder: Secondary | ICD-10-CM | POA: Insufficient documentation

## 2012-01-21 DIAGNOSIS — L408 Other psoriasis: Secondary | ICD-10-CM | POA: Insufficient documentation

## 2012-01-21 DIAGNOSIS — K219 Gastro-esophageal reflux disease without esophagitis: Secondary | ICD-10-CM | POA: Insufficient documentation

## 2012-01-21 DIAGNOSIS — F489 Nonpsychotic mental disorder, unspecified: Secondary | ICD-10-CM | POA: Insufficient documentation

## 2012-01-21 DIAGNOSIS — M25511 Pain in right shoulder: Secondary | ICD-10-CM | POA: Diagnosis present

## 2012-01-21 DIAGNOSIS — F4310 Post-traumatic stress disorder, unspecified: Secondary | ICD-10-CM | POA: Insufficient documentation

## 2012-01-21 DIAGNOSIS — Z01812 Encounter for preprocedural laboratory examination: Secondary | ICD-10-CM | POA: Insufficient documentation

## 2012-01-21 DIAGNOSIS — Z79899 Other long term (current) drug therapy: Secondary | ICD-10-CM | POA: Insufficient documentation

## 2012-01-21 HISTORY — DX: Cardiac murmur, unspecified: R01.1

## 2012-01-21 HISTORY — DX: Schizoaffective disorder, unspecified: F25.9

## 2012-01-21 HISTORY — DX: Alcohol abuse, in remission: F10.11

## 2012-01-21 HISTORY — DX: Personal history of other infectious and parasitic diseases: Z86.19

## 2012-01-21 HISTORY — DX: Personal history of other diseases of the female genital tract: Z87.42

## 2012-01-21 HISTORY — PX: EXAM UNDER ANESTHESIA WITH MANIPULATION OF SHOULDER: SHX5817

## 2012-01-21 HISTORY — DX: Personal history of other diseases of the respiratory system: Z87.09

## 2012-01-21 HISTORY — DX: Unspecified convulsions: R56.9

## 2012-01-21 HISTORY — PX: SHOULDER ARTHROSCOPY: SHX128

## 2012-01-21 LAB — POCT HEMOGLOBIN-HEMACUE: Hemoglobin: 14.8 g/dL (ref 12.0–15.0)

## 2012-01-21 SURGERY — ARTHROSCOPY, SHOULDER
Anesthesia: General | Site: Shoulder | Laterality: Right | Wound class: Clean

## 2012-01-21 MED ORDER — CEFAZOLIN SODIUM-DEXTROSE 2-3 GM-% IV SOLR
INTRAVENOUS | Status: DC | PRN
  Administered 2012-01-21: 2 g via INTRAVENOUS

## 2012-01-21 MED ORDER — SUCCINYLCHOLINE CHLORIDE 20 MG/ML IJ SOLN
INTRAMUSCULAR | Status: DC | PRN
  Administered 2012-01-21: 100 mg via INTRAVENOUS

## 2012-01-21 MED ORDER — PROPOFOL 10 MG/ML IV BOLUS
INTRAVENOUS | Status: DC | PRN
  Administered 2012-01-21: 200 mg via INTRAVENOUS

## 2012-01-21 MED ORDER — OXYCODONE HCL 5 MG PO TABS
5.0000 mg | ORAL_TABLET | Freq: Once | ORAL | Status: AC | PRN
  Administered 2012-01-21: 5 mg via ORAL

## 2012-01-21 MED ORDER — METHOCARBAMOL 500 MG PO TABS: 500.0000 mg | ORAL_TABLET | Freq: Four times a day (QID) | ORAL | Status: DC

## 2012-01-21 MED ORDER — MIDAZOLAM HCL 2 MG/2ML IJ SOLN
1.0000 mg | INTRAMUSCULAR | Status: DC | PRN
  Administered 2012-01-21: 2 mg via INTRAVENOUS

## 2012-01-21 MED ORDER — DEXAMETHASONE SODIUM PHOSPHATE 4 MG/ML IJ SOLN
INTRAMUSCULAR | Status: DC | PRN
  Administered 2012-01-21: 10 mg via INTRAVENOUS

## 2012-01-21 MED ORDER — FENTANYL CITRATE 0.05 MG/ML IJ SOLN
50.0000 ug | INTRAMUSCULAR | Status: DC | PRN
  Administered 2012-01-21: 100 ug via INTRAVENOUS

## 2012-01-21 MED ORDER — LACTATED RINGERS IV SOLN
INTRAVENOUS | Status: DC
  Administered 2012-01-21 (×3): via INTRAVENOUS

## 2012-01-21 MED ORDER — SODIUM CHLORIDE 0.9 % IR SOLN
Status: DC | PRN
  Administered 2012-01-21: 3500 mL

## 2012-01-21 MED ORDER — BUPIVACAINE-EPINEPHRINE PF 0.5-1:200000 % IJ SOLN
INTRAMUSCULAR | Status: DC | PRN
  Administered 2012-01-21: 30 mL

## 2012-01-21 MED ORDER — OXYCODONE HCL 5 MG/5ML PO SOLN: 5.0000 mg | Freq: Once | ORAL | Status: AC | PRN

## 2012-01-21 MED ORDER — MEPERIDINE HCL 25 MG/ML IJ SOLN: 6.2500 mg | INTRAMUSCULAR | Status: DC | PRN

## 2012-01-21 MED ORDER — ONDANSETRON HCL 4 MG/2ML IJ SOLN: 4.0000 mg | Freq: Once | INTRAMUSCULAR | Status: DC | PRN

## 2012-01-21 MED ORDER — HYDROMORPHONE HCL PF 1 MG/ML IJ SOLN
0.2500 mg | INTRAMUSCULAR | Status: DC | PRN
  Administered 2012-01-21 (×2): 0.5 mg via INTRAVENOUS

## 2012-01-21 MED ORDER — PROMETHAZINE HCL 25 MG PO TABS: 25.0000 mg | ORAL_TABLET | Freq: Four times a day (QID) | ORAL | Status: DC | PRN

## 2012-01-21 MED ORDER — MIDAZOLAM HCL 5 MG/5ML IJ SOLN
INTRAMUSCULAR | Status: DC | PRN
  Administered 2012-01-21: 2 mg via INTRAVENOUS

## 2012-01-21 MED ORDER — OXYCODONE-ACETAMINOPHEN 5-325 MG PO TABS: 1.0000 | ORAL_TABLET | Freq: Four times a day (QID) | ORAL | Status: DC | PRN

## 2012-01-21 MED ORDER — ONDANSETRON HCL 4 MG/2ML IJ SOLN
INTRAMUSCULAR | Status: DC | PRN
  Administered 2012-01-21: 4 mg via INTRAVENOUS

## 2012-01-21 MED ORDER — KETOROLAC TROMETHAMINE 10 MG PO TABS: 10.0000 mg | ORAL_TABLET | Freq: Four times a day (QID) | ORAL | Status: DC | PRN

## 2012-01-21 MED ORDER — LIDOCAINE HCL (CARDIAC) 10 MG/ML IV SOLN
INTRAVENOUS | Status: DC | PRN
  Administered 2012-01-21: 100 mg via INTRAVENOUS

## 2012-01-21 SURGICAL SUPPLY — 60 items
APL SKNCLS STERI-STRIP NONHPOA (GAUZE/BANDAGES/DRESSINGS) ×1
BENZOIN TINCTURE PRP APPL 2/3 (GAUZE/BANDAGES/DRESSINGS) ×2 IMPLANT
BLADE CUTTER GATOR 3.5 (BLADE) ×2 IMPLANT
BLADE GREAT WHITE 4.2 (BLADE) IMPLANT
BLADE SURG 15 STRL LF DISP TIS (BLADE) IMPLANT
BLADE SURG 15 STRL SS (BLADE)
BUR OVAL 4.0 (BURR) IMPLANT
BUR OVAL 6.0 (BURR) IMPLANT
CANISTER OMNI JUG 16 LITER (MISCELLANEOUS) ×2 IMPLANT
CANNULA 5.75X71 LONG (CANNULA) IMPLANT
CANNULA TWIST IN 8.25X7CM (CANNULA) ×1 IMPLANT
CLOTH BEACON ORANGE TIMEOUT ST (SAFETY) ×2 IMPLANT
DECANTER SPIKE VIAL GLASS SM (MISCELLANEOUS) IMPLANT
DRAPE INCISE IOBAN 66X45 STRL (DRAPES) ×2 IMPLANT
DRAPE SHOULDER BEACH CHAIR (DRAPES) ×2 IMPLANT
DRAPE U 20/CS (DRAPES) ×2 IMPLANT
DRAPE U-SHAPE 47X51 STRL (DRAPES) ×2 IMPLANT
DRSG PAD ABDOMINAL 8X10 ST (GAUZE/BANDAGES/DRESSINGS) ×2 IMPLANT
DURAPREP 26ML APPLICATOR (WOUND CARE) ×2 IMPLANT
ELECT REM PT RETURN 9FT ADLT (ELECTROSURGICAL)
ELECTRODE REM PT RTRN 9FT ADLT (ELECTROSURGICAL) IMPLANT
GLOVE BIO SURGEON STRL SZ8 (GLOVE) ×4 IMPLANT
GLOVE BIOGEL M STRL SZ7.5 (GLOVE) ×1 IMPLANT
GLOVE INDICATOR 8.0 STRL GRN (GLOVE) ×5 IMPLANT
GLOVE ORTHO TXT STRL SZ7.5 (GLOVE) ×2 IMPLANT
GOWN BRE IMP PREV XXLGXLNG (GOWN DISPOSABLE) ×5 IMPLANT
IMMOBILIZER SHOULDER XLGE (ORTHOPEDIC SUPPLIES) IMPLANT
IV NS IRRIG 3000ML ARTHROMATIC (IV SOLUTION) ×2 IMPLANT
KIT SHOULDER TRACTION (DRAPES) ×2 IMPLANT
LASSO SUT 90 DEGREE (SUTURE) IMPLANT
PACK ARTHROSCOPY DSU (CUSTOM PROCEDURE TRAY) ×2 IMPLANT
PACK BASIN DAY SURGERY FS (CUSTOM PROCEDURE TRAY) ×2 IMPLANT
SET ARTHROSCOPY TUBING (MISCELLANEOUS) ×2
SET ARTHROSCOPY TUBING LN (MISCELLANEOUS) ×1 IMPLANT
SHEET MEDIUM DRAPE 40X70 STRL (DRAPES) ×2 IMPLANT
SLEEVE SCD COMPRESS KNEE MED (MISCELLANEOUS) ×2 IMPLANT
SLING ARM FOAM STRAP LRG (SOFTGOODS) IMPLANT
SLING ARM FOAM STRAP MED (SOFTGOODS) IMPLANT
SLING ARM FOAM STRAP XLG (SOFTGOODS) IMPLANT
SLING ARM IMMOBILIZER LRG (SOFTGOODS) IMPLANT
SLING ARM IMMOBILIZER MED (SOFTGOODS) IMPLANT
SLING ULTRA II MEDIUM (SOFTGOODS) ×1 IMPLANT
SPONGE GAUZE 4X4 12PLY (GAUZE/BANDAGES/DRESSINGS) ×2 IMPLANT
STRIP CLOSURE SKIN 1/2X4 (GAUZE/BANDAGES/DRESSINGS) ×2 IMPLANT
SUT FIBERWIRE #2 38 T-5 BLUE (SUTURE)
SUT LASSO 45 DEGREE (SUTURE) IMPLANT
SUT LASSO 45 DEGREE LEFT (SUTURE) IMPLANT
SUT LASSO 45D RIGHT (SUTURE) IMPLANT
SUT MNCRL AB 4-0 PS2 18 (SUTURE) IMPLANT
SUT PDS AB 1 CT  36 (SUTURE)
SUT PDS AB 1 CT 36 (SUTURE) IMPLANT
SUT TIGER TAPE 7 IN WHITE (SUTURE) IMPLANT
SUT VIC AB 3-0 SH 27 (SUTURE)
SUT VIC AB 3-0 SH 27X BRD (SUTURE) IMPLANT
SUTURE FIBERWR #2 38 T-5 BLUE (SUTURE) IMPLANT
TOWEL OR 17X24 6PK STRL BLUE (TOWEL DISPOSABLE) ×2 IMPLANT
TOWEL OR NON WOVEN STRL DISP B (DISPOSABLE) ×2 IMPLANT
TUBE CONNECTING 20X1/4 (TUBING) IMPLANT
WAND STAR VAC 90 (SURGICAL WAND) ×2 IMPLANT
WATER STERILE IRR 1000ML POUR (IV SOLUTION) ×2 IMPLANT

## 2012-01-21 NOTE — Anesthesia Procedure Notes (Addendum)
Anesthesia Regional Block:  Interscalene brachial plexus block  Pre-Anesthetic Checklist: ,, timeout performed, Correct Patient, Correct Site, Correct Laterality, Correct Procedure, Correct Position, site marked, Risks and benefits discussed,  Surgical consent,  Pre-op evaluation,  At surgeon's request and post-op pain management  Laterality: Right  Prep: chloraprep       Needles:  Injection technique: Single-shot  Needle Type: Echogenic Stimulator Needle     Needle Length: 5cm 5 cm     Additional Needles:  Procedures: ultrasound guided (picture in chart) and nerve stimulator Interscalene brachial plexus block  Nerve Stimulator or Paresthesia:  Response: 0.4 mA,   Additional Responses:   Narrative:  Start time: 01/21/2012 2:36 PM End time: 01/21/2012 2:43 PM Injection made incrementally with aspirations every 5 mL.  Performed by: Personally  Anesthesiologist: Arta Bruce MD  Additional Notes: Monitors applied. Patient sedated. Sterile prep and drape,hand hygiene and sterile gloves were used. Relevant anatomy identified.Needle position confirmed.Local anesthetic injected incrementally after negative aspiration. Local anesthetic spread visualized around nerve(s). Vascular puncture avoided. No complications. Image printed for medical record.The patient tolerated the procedure well.       Interscalene brachial plexus block Procedure Name: Intubation Date/Time: 01/21/2012 3:40 PM Performed by: Gar Gibbon Pre-anesthesia Checklist: Patient identified, Emergency Drugs available, Suction available and Patient being monitored Patient Re-evaluated:Patient Re-evaluated prior to inductionOxygen Delivery Method: Circle system utilized Preoxygenation: Pre-oxygenation with 100% oxygen Intubation Type: IV induction Ventilation: Mask ventilation without difficulty Tube type: Oral Tube size: 7.0 mm Number of attempts: 1 Airway Equipment and Method: Stylet Placement  Confirmation: ETT inserted through vocal cords under direct vision,  positive ETCO2 and breath sounds checked- equal and bilateral Secured at: 22 cm Dental Injury: Teeth and Oropharynx as per pre-operative assessment  Comments: LTAx1

## 2012-01-21 NOTE — Op Note (Signed)
01/21/2012  4:18 PM  PATIENT:  [name]    PRE-OPERATIVE DIAGNOSIS:  RIGHT SHOULDER DISORDER ARTICULAR CARTILAGE-SHOULDER, ADHESIVE CAPSULITIS-FROZEN SHOULDER  POST-OPERATIVE DIAGNOSIS:  Right shoulder with normal structural architecture  PROCEDURE:  Diagnostic arthroscopy, right shoulder, examination under anesthesia.  SURGEON:  Eulas Post, MD  PHYSICIAN ASSISTANT: Janace Litten, OPA-C, present and scrubbed throughout the case, critical for completion in a timely fashion, and for retraction, instrumentation, and closure.  ANESTHESIA:   General  PREOPERATIVE INDICATIONS:  [name] is a  31 y.o. female with a diagnosis of right shoulder pain, possible adhesive capsulitis, question occult rotator cuff disease versus labral pathology, who failed conservative measures including 2 years of anti-inflammatories, injections, activity modification, and elected for surgical management.    Her preoperative MRI did not demonstrate any structural abnormalities, and I counseled her that there was a possibility that we would not find anything structural that could be addressed surgically.  The risks benefits and alternatives were discussed with the patient preoperatively including but not limited to the risks of infection, bleeding, nerve injury, cardiopulmonary complications, the need for revision surgery, among others, and the patient was willing to proceed.  OPERATIVE IMPLANTS: None  OPERATIVE FINDINGS: Normal glenohumeral articular cartilage. No superior labral tear, or anterior labral tear, or posterior labral tear. If anything she had a moderately patulous capsule, but that was the only operative finding of any abnormality. She had full motion, and did not dislocate during examination under anesthesia. The undersurface of the rotator cuff was completely normal. There may have been some very mild fraying right behind the biceps, but this is fairly minimal. The biceps tendon was  completely normal. Subscapularis and pulley were normal. Middle glenohumeral ligament was intact.  In the subacromial space there was no abnormality, no bursitis, no wear of the CA ligament. The rotator cuff was completely intact.  OPERATIVE PROCEDURE:  Patient was brought to the operating room and placed in supine position. General anesthesia was administered. Regional block and given. Time out was performed. The right upper extremity was examined and had full motion. No instability.   The extremity was prepped and draped in usual sterile fashion. Diagnostic arthroscopy was carried out the above-named findings. I did utilize the shaver to debride a small portion of the undersurface of the rotator cuff, but nothing significant.  After completion of the glenohumeral arthroscopy I went to the subacromial space, and did make a lateral portal for full inspection, but did not find any abnormalities.  Instruments were removed, and the portals closed with Monocryl followed by Steri-Strips and sterile gauze. She was awakened and returned back in stable and satisfactory condition.
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
## 2012-01-21 NOTE — Progress Notes (Signed)
2Assisted Dr. Michelle Piper with right, ultrasound guided, interscalene  block. Side rails up, monitors on throughout procedure. See vital signs in flow sheet. Tolerated Procedure well.

## 2012-01-21 NOTE — Transfer of Care (Signed)
Immediate Anesthesia Transfer of Care Note  Patient: [name]  Procedure(s) Performed: Procedure(s) (LRB) with comments: ARTHROSCOPY SHOULDER (Right) - RIGHT SHOULDER: ARTHROSCOPY SHOULDER DIAGNOSTIC, MANIPULATION SHOULDER UNDER ANESTHESIA EXAM UNDER ANESTHESIA WITH MANIPULATION OF SHOULDER (Right)  Patient Location: PACU  Anesthesia Type:GA combined with regional for post-op pain  Level of Consciousness: sedated and patient cooperative  Airway & Oxygen Therapy: Patient Spontanous Breathing and Patient connected to face mask oxygen  Post-op Assessment: Report given to PACU RN and Post -op Vital signs reviewed and stable  Post vital signs: Reviewed and stable  Complications: No apparent anesthesia complications

## 2012-01-21 NOTE — Anesthesia Postprocedure Evaluation (Signed)
Anesthesia Post Note  Patient: [name]  Procedure(s) Performed: Procedure(s) (LRB): ARTHROSCOPY SHOULDER (Right) EXAM UNDER ANESTHESIA WITH MANIPULATION OF SHOULDER (Right)  Anesthesia type: general  Patient location: PACU  Post pain: Pain level controlled  Post assessment: Patient's Cardiovascular Status Stable  Last Vitals:  Filed Vitals:   01/21/12 1700  BP: 141/90  Pulse: 82  Temp:   Resp: 13    Post vital signs: Reviewed and stable  Level of consciousness: sedated  Complications: No apparent anesthesia complications

## 2012-01-21 NOTE — Anesthesia Preprocedure Evaluation (Signed)
Anesthesia Evaluation   Patient awake    Reviewed: Allergy & Precautions, H&P , NPO status , Patient's Chart, lab work & pertinent test results  Airway Mallampati: I TM Distance: >3 FB Neck ROM: Full    Dental   Pulmonary asthma ,          Cardiovascular hypertension, Pt. on medications     Neuro/Psych Depression Bipolar Disorder    GI/Hepatic GERD-  Medicated and Controlled,  Endo/Other    Renal/GU      Musculoskeletal   Abdominal   Peds  Hematology   Anesthesia Other Findings   Reproductive/Obstetrics                           Anesthesia Physical Anesthesia Plan  ASA: III  Anesthesia Plan: General   Post-op Pain Management:    Induction: Intravenous  Airway Management Planned: Oral ETT  Additional Equipment:   Intra-op Plan:   Post-operative Plan: Extubation in OR  Informed Consent: I have reviewed the patients History and Physical, chart, labs and discussed the procedure including the risks, benefits and alternatives for the proposed anesthesia with the patient or authorized representative who has indicated his/her understanding and acceptance.     Plan Discussed with: CRNA and Surgeon  Anesthesia Plan Comments:         Anesthesia Quick Evaluation

## 2012-01-24 ENCOUNTER — Ambulatory Visit: Payer: Medicaid Other | Admitting: Physical Therapy

## 2012-01-25 ENCOUNTER — Encounter (HOSPITAL_BASED_OUTPATIENT_CLINIC_OR_DEPARTMENT_OTHER): Payer: Self-pay | Admitting: Orthopedic Surgery

## 2012-01-31 ENCOUNTER — Encounter: Payer: Medicaid Other | Admitting: Physical Therapy

## 2012-02-08 ENCOUNTER — Encounter: Payer: Medicaid Other | Admitting: Obstetrics and Gynecology

## 2012-02-23 ENCOUNTER — Telehealth: Payer: Self-pay | Admitting: Family Medicine

## 2012-02-23 NOTE — Telephone Encounter (Signed)
Patient needs a refill of Omeprazole called in to [facility] on [address].

## 2012-02-24 ENCOUNTER — Ambulatory Visit (INDEPENDENT_AMBULATORY_CARE_PROVIDER_SITE_OTHER): Payer: Medicaid Other | Admitting: Family Medicine

## 2012-02-24 ENCOUNTER — Encounter: Payer: Self-pay | Admitting: Family Medicine

## 2012-02-24 VITALS — BP 129/95 | HR 118 | Temp 97.9°F | Ht 66.5 in | Wt 194.0 lb

## 2012-02-24 DIAGNOSIS — R51 Headache: Secondary | ICD-10-CM

## 2012-02-24 DIAGNOSIS — I10 Essential (primary) hypertension: Secondary | ICD-10-CM

## 2012-02-24 MED ORDER — PROMETHAZINE HCL 25 MG PO TABS: 25.0000 mg | ORAL_TABLET | Freq: Three times a day (TID) | ORAL | Status: DC | PRN

## 2012-02-24 MED ORDER — KETOROLAC TROMETHAMINE 30 MG/ML IJ SOLN
30.0000 mg | Freq: Once | INTRAMUSCULAR | Status: AC
  Administered 2012-02-24: 30 mg via INTRAMUSCULAR

## 2012-02-24 MED ORDER — NAPROXEN 500 MG PO TABS: 500.0000 mg | ORAL_TABLET | Freq: Two times a day (BID) | ORAL | Status: DC

## 2012-02-24 MED ORDER — PROMETHAZINE HCL 25 MG/ML IJ SOLN
25.0000 mg | Freq: Once | INTRAMUSCULAR | Status: AC
  Administered 2012-02-24: 25 mg via INTRAMUSCULAR

## 2012-02-24 NOTE — Patient Instructions (Addendum)
The headache you are having is likely from a migraine or from a tension headache. I am going to prescribe a nausea medicine called phenergan and a NSAID medicine called naproxen to take with plenty of food and drink.  Please follow up Monday. Return to clinic or urgent care sooner if you have worsening headache that is not relieved by medicine.

## 2012-02-25 DIAGNOSIS — R51 Headache: Secondary | ICD-10-CM | POA: Insufficient documentation

## 2012-02-25 DIAGNOSIS — R519 Headache, unspecified: Secondary | ICD-10-CM | POA: Insufficient documentation

## 2012-02-25 NOTE — Assessment & Plan Note (Signed)
Elevated diastolic with narrow pulse pressure. Continue current management with HCTZ 25mg . Patient to follow up early next week.

## 2012-02-25 NOTE — Assessment & Plan Note (Signed)
Tension vs migraine. No focal neuro deficit making acute subarachnoid bleed unlikely.  Toradol 30mg  and phenergan 25mg  IM given in office with improvement of symptoms to 7/10 from 10/10.  Patient to take phenergan at home as needed for nausea. Also sent Rx for naproxen. Aware that patient has history of gastric ulcer and that this could increase risk of recurrence. Reviewed this with patient and since it will only be for temporary action, benefits seem to outweigh risks. Patient to continue PPI and aware of red flags (dark stools, light headedness, gastric pain). Naproxen only as needed until follow up early next week.

## 2012-02-25 NOTE — Progress Notes (Signed)
Patient ID: [name]    DOB: 1980/10/01, 31 y.o.   MRN: [id number] --- Subjective:  [name] is a 31 y.o.female with h/o HTN, Bipolar disorder and recent rotator cuff injury who presents with 2 day history of headache. Headache is located in bilateral temples and across her orbits. She describes it as a pulsating , constant pain. 10/10. She reports some associated dizziness, worst with standing. No change in vision. She reports photophobia and phonophobia. She also reports nausea but no vomiting. Reports chills but no fever. No recent head trauma.  No reported history of migraine headaches in the past. No focal weakness. No worsening headache with valsalva maneuvers.  She reports taking 6 extra tylenol yesterday which did not help.  ROS: see HPI Past Medical History: reviewed and updated medications and allergies. Social History: Tobacco: current smoker  Objective: Filed Vitals:   02/24/12 1053  BP: 129/95  Pulse: 118  Temp: 97.9 F (36.6 C)    Physical Examination:   General appearance - alert, in mild distress from pain, sitting in darker room Ears - bilateral TM's and external ear canals normal Nose - normal and patent, no erythema, discharge or polyps Mouth - mucous membranes moist, pharynx normal without lesions Neck - supple, no significant adenopathy Chest - clear to auscultation, no wheezes, rales or rhonchi, symmetric air entry Heart - normal rate, regular rhythm, normal S1, S2, no murmurs, rubs, clicks or gallops Neuro - CN2-12 grossly intact except for minimal leftward horizontal nystagmus, 5/5 strength in upper and lower extremities bilaterally, no clonus, negative babinski, normal sensation to light touch Head - no tenderness along temples or orbits.  Eyes - normal appearing conjunctiva, no injection.

## 2012-02-28 ENCOUNTER — Ambulatory Visit: Payer: Medicaid Other | Admitting: Family Medicine

## 2012-03-14 ENCOUNTER — Ambulatory Visit: Payer: Medicaid Other | Admitting: Physical Therapy

## 2012-03-16 ENCOUNTER — Ambulatory Visit: Payer: Medicaid Other | Attending: Family Medicine | Admitting: Physical Therapy

## 2012-03-16 DIAGNOSIS — M25619 Stiffness of unspecified shoulder, not elsewhere classified: Secondary | ICD-10-CM | POA: Insufficient documentation

## 2012-03-16 DIAGNOSIS — M25519 Pain in unspecified shoulder: Secondary | ICD-10-CM | POA: Insufficient documentation

## 2012-03-16 DIAGNOSIS — IMO0001 Reserved for inherently not codable concepts without codable children: Secondary | ICD-10-CM | POA: Insufficient documentation

## 2012-03-16 DIAGNOSIS — R293 Abnormal posture: Secondary | ICD-10-CM | POA: Insufficient documentation

## 2012-03-20 ENCOUNTER — Encounter: Payer: Self-pay | Admitting: Family Medicine

## 2012-03-20 ENCOUNTER — Encounter: Payer: Medicaid Other | Admitting: Obstetrics and Gynecology

## 2012-03-20 ENCOUNTER — Ambulatory Visit (INDEPENDENT_AMBULATORY_CARE_PROVIDER_SITE_OTHER): Payer: Medicaid Other | Admitting: Family Medicine

## 2012-03-20 VITALS — BP 129/85 | HR 99 | Temp 98.7°F | Ht 66.5 in | Wt 201.0 lb

## 2012-03-20 DIAGNOSIS — F3160 Bipolar disorder, current episode mixed, unspecified: Secondary | ICD-10-CM

## 2012-03-20 DIAGNOSIS — F17200 Nicotine dependence, unspecified, uncomplicated: Secondary | ICD-10-CM

## 2012-03-20 DIAGNOSIS — M25519 Pain in unspecified shoulder: Secondary | ICD-10-CM

## 2012-03-20 DIAGNOSIS — K219 Gastro-esophageal reflux disease without esophagitis: Secondary | ICD-10-CM

## 2012-03-20 DIAGNOSIS — Z309 Encounter for contraceptive management, unspecified: Secondary | ICD-10-CM

## 2012-03-20 DIAGNOSIS — Z23 Encounter for immunization: Secondary | ICD-10-CM

## 2012-03-20 DIAGNOSIS — F1010 Alcohol abuse, uncomplicated: Secondary | ICD-10-CM

## 2012-03-20 DIAGNOSIS — M25511 Pain in right shoulder: Secondary | ICD-10-CM

## 2012-03-20 DIAGNOSIS — Z9851 Tubal ligation status: Secondary | ICD-10-CM

## 2012-03-20 DIAGNOSIS — F259 Schizoaffective disorder, unspecified: Secondary | ICD-10-CM

## 2012-03-20 DIAGNOSIS — IMO0001 Reserved for inherently not codable concepts without codable children: Secondary | ICD-10-CM

## 2012-03-20 DIAGNOSIS — Z79899 Other long term (current) drug therapy: Secondary | ICD-10-CM

## 2012-03-20 DIAGNOSIS — I10 Essential (primary) hypertension: Secondary | ICD-10-CM

## 2012-03-20 LAB — BASIC METABOLIC PANEL
BUN: 6 mg/dL (ref 6–23)
Chloride: 100 mEq/L (ref 96–112)
Potassium: 3.6 mEq/L (ref 3.5–5.3)
Sodium: 139 mEq/L (ref 135–145)

## 2012-03-20 LAB — TSH: TSH: 1.166 u[IU]/mL (ref 0.350–4.500)

## 2012-03-20 MED ORDER — AMLODIPINE BESYLATE 5 MG PO TABS: 5.0000 mg | ORAL_TABLET | Freq: Every day | ORAL | Status: DC

## 2012-03-20 MED ORDER — OMEPRAZOLE 40 MG PO CPDR: 40.0000 mg | DELAYED_RELEASE_CAPSULE | Freq: Two times a day (BID) | ORAL | Status: DC

## 2012-03-20 NOTE — Patient Instructions (Addendum)
While your blood pressure is better, I would like you to stop the Hydrochlorothiazide because possible interactions with Lithium medication. Start Amlodipine instead of the Hydrochlorothiazide for your blood pressure.   Dr Lynnae Ludemann will work on arranging a consultation with a [facility] physician to discuss treatments   Increase your Omeprazole to one capsule twice a day for next 4 weeks then decrease back down to one tablet a day  You will receive the results of your lab results within 2 weeks. If you do not receive results; please call.

## 2012-03-21 ENCOUNTER — Encounter: Payer: Self-pay | Admitting: Family Medicine

## 2012-03-21 DIAGNOSIS — F250 Schizoaffective disorder, bipolar type: Secondary | ICD-10-CM | POA: Insufficient documentation

## 2012-03-21 NOTE — Progress Notes (Signed)
  Subjective:    Patient ID: [name], female    DOB: [birth year], 32 y.o.   MRN: [id number]  HPI CHRONIC HYPERTENSION  Disease Monitoring  Blood pressure range: not checking at home  Chest pain: no   Dyspnea: no   Claudication: no   Medication compliance: yes  Medication Side Effects  Lightheadedness: no   Urinary frequency: no   Edema: no    Bipolar Disorder and/or Schizoaffective Disorder (per problem list) Uncertain how schizoaffective disorder appeared on pt's PMH Being managed at [facility]  Pt is now on Lithium and Zyprexa.  Receiving in-home psychological counseling from [facility] Pt feels like therapy is helping her understand her situation and how to cope with it. No thoughts of self harm She reports that Vesta Mixer is monitoring her lithium levels and adjusting her psychoactive meds. No thoughts of self harm or harming others. She reports that she did hear voices in the past, some commanding her to hurt herself.  She denies hearing these voices now since being on Zyprexa. Pt is discouraged by her weight gain since starting the Zyprexa  Right shoulder pain  Recent right shoulder arthroscopy under regional anesthesia by Dr Dion Saucier (ortho): " The right upper extremity was examined and had full motion. No instability. ...  Diagnostic arthroscopy was carried out the above-named findings. I did utilize the shaver to debride a small portion of the undersurface of the rotator cuff, but nothing significant.  After completion of the glenohumeral arthroscopy I went to the subacromial space, and did make a lateral portal for full inspection, but did not find any abnormalities..."  Pt continues to complain of right shoulder pain and inability to raise her arm above her head. Pt noted during our interview that she thought the arthroscopy found support her ongoing application for disability.  SH: Patient is dependent on her Husband's disability  income now that her youngest daughter's disability income has been stopped.   Alcohol Dependence  Pt reports sobriety for alcohol for the last 2 months, though she reports smoking marijuana not infrequently to help with her shoulder pain and anxiety.   Contraception  Pt wants to explore a revision of her BTL given that she was able to conceive despite this intervention. The pregnancy ended in August 2013 in a spontaneous incomplete abortion that required Dilation and evacuation by Dr Pennie Rushing (GYN).   Review of Systems See HPI     Objective:   Physical Exam  Constitutional: She appears well-developed. No distress.       obese  Cardiovascular: Normal rate, regular rhythm and normal heart sounds.   No murmur heard. Pulmonary/Chest: Effort normal and breath sounds normal.  Musculoskeletal: She exhibits no edema.       Arms: Psychiatric: She has a normal mood and affect. Her behavior is normal. Thought content normal. Her mood appears not anxious. Her speech is not rapid and/or pressured, not tangential and not slurred. She is not agitated and not slowed. Thought content is not paranoid and not delusional. Cognition and memory are normal. She expresses no homicidal and no suicidal ideation. She expresses no suicidal plans. She exhibits normal recent memory. She is attentive.          Assessment & Plan:

## 2012-03-21 NOTE — Assessment & Plan Note (Signed)
Adequate BP control.   Given potential for interaction of Lithium with diuretics, I recommended that [name] stop the HCTZ and switch to Amlodipine 5 mg daily.  She is willling to try this.  She is to return in two weeks for recheck of BP on new antihypertensive.

## 2012-03-21 NOTE — Assessment & Plan Note (Signed)
Given the failed contraception from her BTL, the patient would like to consult with the OBGYN service at [facility] to discuss treatment options to ensure contraception.  Will make referral for consultation for this contraceptive issue. In interim, patient is on Depo-medrol.

## 2012-03-21 NOTE — Assessment & Plan Note (Signed)
Abstinent for 2 months per patient.  Using marijuana not infrequently for anxiety.

## 2012-03-21 NOTE — Assessment & Plan Note (Signed)
Has returned to smoking, not currently interested in cessation.  May consider in future.

## 2012-03-21 NOTE — Addendum Note (Signed)
Addended byPerley Jain, Tru Leopard D on: 03/21/2012 04:04 PM   Modules accepted: Orders

## 2012-03-21 NOTE — Assessment & Plan Note (Addendum)
Pt's relapsing nausea and epigastric discomfort has returned over the last couple months. No red flags of weight loss, melena, anemia, hematochezia. Lab Results  Component Value Date   WBC 6.5 10/14/2011   HGB 14.8 01/21/2012   HCT 37.1 10/14/2011   MCV 87.3 10/14/2011   PLT 219 10/14/2011    Will increase pt's omeprazole to 40 mg twice a day for 4 weeks, then return to 40 mg daily. If does not improve symptoms, will consider further diagnostic workup.

## 2012-03-21 NOTE — Assessment & Plan Note (Signed)
There were no significant pathologic findings on Dr Shelba Flake arthroscopic evaluation of the patient's right shoulder. Pain in shoulder continues.  It inhibits her ability to abduct her shoulder.  [name] expressed that she thought the arthroscopic findings would support her application for disability with [employer].

## 2012-03-21 NOTE — Assessment & Plan Note (Signed)
Now on Lithium and Zyprexa for Bipolar/Schizoaffective disorder. Managed by [employer]. Lab Results  Component Value Date   NA 139 03/20/2012   BUN 6 03/20/2012   CREATININE 0.84 03/20/2012   TSH 1.166 03/20/2012   WBC 6.5 10/14/2011

## 2012-04-03 ENCOUNTER — Ambulatory Visit: Payer: Medicaid Other | Admitting: Physical Therapy

## 2012-04-06 ENCOUNTER — Encounter: Payer: Self-pay | Admitting: Family Medicine

## 2012-04-06 ENCOUNTER — Ambulatory Visit (INDEPENDENT_AMBULATORY_CARE_PROVIDER_SITE_OTHER): Payer: Medicaid Other | Admitting: Family Medicine

## 2012-04-06 VITALS — BP 128/86 | HR 112 | Temp 98.0°F | Ht 66.5 in | Wt 202.2 lb

## 2012-04-06 DIAGNOSIS — Z8709 Personal history of other diseases of the respiratory system: Secondary | ICD-10-CM

## 2012-04-06 DIAGNOSIS — M25511 Pain in right shoulder: Secondary | ICD-10-CM

## 2012-04-06 DIAGNOSIS — K219 Gastro-esophageal reflux disease without esophagitis: Secondary | ICD-10-CM

## 2012-04-06 DIAGNOSIS — I10 Essential (primary) hypertension: Secondary | ICD-10-CM

## 2012-04-06 DIAGNOSIS — M25519 Pain in unspecified shoulder: Secondary | ICD-10-CM

## 2012-04-06 MED ORDER — AZELASTINE HCL 0.1 % NA SOLN: 2.0000 | Freq: Two times a day (BID) | NASAL | Status: DC

## 2012-04-06 MED ORDER — CETIRIZINE HCL 10 MG PO TABS: 10.0000 mg | ORAL_TABLET | Freq: Every day | ORAL | Status: DC

## 2012-04-06 MED ORDER — TRIAMCINOLONE ACETONIDE 0.1 % EX LOTN: TOPICAL_LOTION | Freq: Every day | CUTANEOUS | Status: DC | PRN

## 2012-04-06 MED ORDER — KETOROLAC TROMETHAMINE 30 MG/ML IJ SOLN
30.0000 mg | Freq: Once | INTRAMUSCULAR | Status: AC
  Administered 2012-04-06: 30 mg via INTRAMUSCULAR

## 2012-04-06 MED ORDER — AMITRIPTYLINE HCL 75 MG PO TABS: 75.0000 mg | ORAL_TABLET | Freq: Every day | ORAL | Status: DC

## 2012-04-06 NOTE — Progress Notes (Signed)
Patient ID: [name], female   DOB: 1981-01-14, 32 y.o.   MRN: [id number]  Subjective:    Patient ID: [name], female    DOB: 1980-06-10, 32 y.o.   MRN: [id number]  HPI CHRONIC HYPERTENSION  Disease Monitoring  Blood pressure range: not checking at home  Chest pain: no   Dyspnea: no   Claudication: no   Medication compliance: taking amlodipine 5 mg daily Medication Side Effects  Lightheadedness: no   Urinary frequency: no   Edema: no    Bipolar Disorder and/or Schizoaffective Disorder (per problem list) Has been changed to paliperidone. Continues on Lithium Alcohol Dependence  Pt reports sobriety for alcohol for the last 3 months  Contraception  Pt wants to explore a revision of her BTL given that she was able to conceive despite this intervention. The pregnancy ended in August 2013 in a spontaneous incomplete abortion that required Dilation and evacuation by Dr Pennie Rushing (GYN).   Review of Systems See HPI     Objective:   Physical Exam  Constitutional: She appears well-developed.       obese  Cardiovascular: Normal rate.   Psychiatric: She has a normal mood and affect. Her behavior is normal. Thought content normal. Her mood appears not anxious. Her speech is not rapid and/or pressured, not tangential and not slurred. She is not agitated and not slowed. Thought content is not paranoid and not delusional. Cognition and memory are normal. She expresses no homicidal and no suicidal ideation. She expresses no suicidal plans. She exhibits normal recent memory. She is attentive.          Assessment & Plan:

## 2012-04-06 NOTE — Assessment & Plan Note (Signed)
Adequate blood pressure control.  No evidence of new end organ damage.  Tolerating amlodipine without significant adverse effects.  Plan to continue current blood pressure regiment.

## 2012-04-06 NOTE — Assessment & Plan Note (Signed)
Improved symptoms on increase dose of omeprazole 40 mg twice a day.  No red flag symptoms of weight loss, dysphagia, melena.  Will continue 2 additional weeks of omeprazole 40 mg twice a day then return to 40 mg daily.

## 2012-04-06 NOTE — Patient Instructions (Addendum)
Continue twice a day omeprazole for 2 more weeks Start the Cetirizine antihistamine daily and Astelin 2 sprays each nostril daily for the post-nasal drip.  Start Triamcinolone ointment daily for eczema skin.   Your blood pressure is under good control with the Amlodipine.  You can request a Toradol injection once a week.  Call the [facility] for nursing appointment for Toradol injections when you need them for your shoulder pain.

## 2012-04-06 NOTE — Assessment & Plan Note (Signed)
Increase in allergic rhinitis symptoms of postnasal drip and globus. Did not tolerate nasal Flonase. Gave her headaches. Trial of Zyrtec and Astelin.

## 2012-04-07 ENCOUNTER — Other Ambulatory Visit: Payer: Self-pay | Admitting: Family Medicine

## 2012-04-07 ENCOUNTER — Telehealth: Payer: Self-pay | Admitting: *Deleted

## 2012-04-07 DIAGNOSIS — L209 Atopic dermatitis, unspecified: Secondary | ICD-10-CM | POA: Insufficient documentation

## 2012-04-07 DIAGNOSIS — J45909 Unspecified asthma, uncomplicated: Secondary | ICD-10-CM

## 2012-04-07 HISTORY — DX: Atopic dermatitis, unspecified: L20.9

## 2012-04-07 MED ORDER — ALBUTEROL SULFATE HFA 108 (90 BASE) MCG/ACT IN AERS: 2.0000 | INHALATION_SPRAY | Freq: Four times a day (QID) | RESPIRATORY_TRACT | Status: DC | PRN

## 2012-04-07 MED ORDER — TRIAMCINOLONE ACETONIDE 0.1 % EX CREA: TOPICAL_CREAM | Freq: Two times a day (BID) | CUTANEOUS | Status: DC

## 2012-04-07 NOTE — Telephone Encounter (Signed)
Please let patient know that I have change the triamcinolone from lotion to cream prescription. Medicaid should cover the prescription now.

## 2012-04-07 NOTE — Telephone Encounter (Signed)
Patient notified

## 2012-04-07 NOTE — Telephone Encounter (Signed)
PA required for triiamcinolone lotion. Form given to Dr. Perley Jain.

## 2012-04-10 ENCOUNTER — Ambulatory Visit: Payer: Medicaid Other | Admitting: Physical Therapy

## 2012-04-17 ENCOUNTER — Encounter: Payer: Medicaid Other | Admitting: Obstetrics and Gynecology

## 2012-04-20 ENCOUNTER — Ambulatory Visit: Payer: Medicaid Other | Admitting: Physical Therapy

## 2012-04-24 ENCOUNTER — Other Ambulatory Visit: Payer: Self-pay | Admitting: Sports Medicine

## 2012-04-27 ENCOUNTER — Ambulatory Visit: Payer: Medicaid Other | Admitting: Physical Therapy

## 2012-05-02 ENCOUNTER — Encounter: Payer: Medicaid Other | Admitting: Obstetrics and Gynecology

## 2012-05-11 ENCOUNTER — Ambulatory Visit: Payer: Medicaid Other

## 2012-05-24 ENCOUNTER — Ambulatory Visit: Payer: Medicaid Other

## 2012-05-24 ENCOUNTER — Ambulatory Visit: Payer: Medicaid Other | Attending: Family Medicine

## 2012-05-24 DIAGNOSIS — R293 Abnormal posture: Secondary | ICD-10-CM | POA: Insufficient documentation

## 2012-05-24 DIAGNOSIS — M25519 Pain in unspecified shoulder: Secondary | ICD-10-CM | POA: Insufficient documentation

## 2012-05-24 DIAGNOSIS — M25619 Stiffness of unspecified shoulder, not elsewhere classified: Secondary | ICD-10-CM | POA: Insufficient documentation

## 2012-05-24 DIAGNOSIS — IMO0001 Reserved for inherently not codable concepts without codable children: Secondary | ICD-10-CM | POA: Insufficient documentation

## 2012-06-07 ENCOUNTER — Ambulatory Visit: Payer: Medicaid Other | Admitting: Physical Therapy

## 2012-06-19 ENCOUNTER — Encounter (HOSPITAL_COMMUNITY): Payer: Self-pay | Admitting: *Deleted

## 2012-06-19 ENCOUNTER — Emergency Department (HOSPITAL_COMMUNITY): Payer: Medicaid Other

## 2012-06-19 ENCOUNTER — Emergency Department (HOSPITAL_COMMUNITY)
Admission: EM | Admit: 2012-06-19 | Discharge: 2012-06-19 | Disposition: A | Discharge status: Home / Self Care | Payer: Medicaid Other | Attending: Emergency Medicine | Admitting: Emergency Medicine

## 2012-06-19 DIAGNOSIS — Z79899 Other long term (current) drug therapy: Secondary | ICD-10-CM | POA: Insufficient documentation

## 2012-06-19 DIAGNOSIS — Z8709 Personal history of other diseases of the respiratory system: Secondary | ICD-10-CM | POA: Insufficient documentation

## 2012-06-19 DIAGNOSIS — R05 Cough: Secondary | ICD-10-CM | POA: Insufficient documentation

## 2012-06-19 DIAGNOSIS — J45901 Unspecified asthma with (acute) exacerbation: Secondary | ICD-10-CM | POA: Insufficient documentation

## 2012-06-19 DIAGNOSIS — F1010 Alcohol abuse, uncomplicated: Secondary | ICD-10-CM | POA: Insufficient documentation

## 2012-06-19 DIAGNOSIS — Z8739 Personal history of other diseases of the musculoskeletal system and connective tissue: Secondary | ICD-10-CM | POA: Insufficient documentation

## 2012-06-19 DIAGNOSIS — R509 Fever, unspecified: Secondary | ICD-10-CM | POA: Insufficient documentation

## 2012-06-19 DIAGNOSIS — F17200 Nicotine dependence, unspecified, uncomplicated: Secondary | ICD-10-CM | POA: Insufficient documentation

## 2012-06-19 DIAGNOSIS — Z8619 Personal history of other infectious and parasitic diseases: Secondary | ICD-10-CM | POA: Insufficient documentation

## 2012-06-19 DIAGNOSIS — I10 Essential (primary) hypertension: Secondary | ICD-10-CM | POA: Insufficient documentation

## 2012-06-19 DIAGNOSIS — K219 Gastro-esophageal reflux disease without esophagitis: Secondary | ICD-10-CM | POA: Insufficient documentation

## 2012-06-19 DIAGNOSIS — F259 Schizoaffective disorder, unspecified: Secondary | ICD-10-CM | POA: Insufficient documentation

## 2012-06-19 DIAGNOSIS — F4310 Post-traumatic stress disorder, unspecified: Secondary | ICD-10-CM | POA: Insufficient documentation

## 2012-06-19 DIAGNOSIS — Z87410 Personal history of cervical dysplasia: Secondary | ICD-10-CM | POA: Insufficient documentation

## 2012-06-19 DIAGNOSIS — J209 Acute bronchitis, unspecified: Secondary | ICD-10-CM

## 2012-06-19 DIAGNOSIS — R059 Cough, unspecified: Secondary | ICD-10-CM | POA: Insufficient documentation

## 2012-06-19 DIAGNOSIS — Z8659 Personal history of other mental and behavioral disorders: Secondary | ICD-10-CM | POA: Insufficient documentation

## 2012-06-19 DIAGNOSIS — IMO0002 Reserved for concepts with insufficient information to code with codable children: Secondary | ICD-10-CM | POA: Insufficient documentation

## 2012-06-19 DIAGNOSIS — Z8742 Personal history of other diseases of the female genital tract: Secondary | ICD-10-CM | POA: Insufficient documentation

## 2012-06-19 DIAGNOSIS — R51 Headache: Secondary | ICD-10-CM | POA: Insufficient documentation

## 2012-06-19 LAB — BASIC METABOLIC PANEL
BUN: 3 mg/dL — ABNORMAL LOW (ref 6–23)
CO2: 24 mEq/L (ref 19–32)
Calcium: 9 mg/dL (ref 8.4–10.5)
Glucose, Bld: 88 mg/dL (ref 70–99)
Sodium: 138 mEq/L (ref 135–145)

## 2012-06-19 LAB — CBC WITH DIFFERENTIAL/PLATELET
Eosinophils Relative: 0 % (ref 0–5)
HCT: 41.4 % (ref 36.0–46.0)
Hemoglobin: 14.6 g/dL (ref 12.0–15.0)
Lymphocytes Relative: 21 % (ref 12–46)
Lymphs Abs: 1.1 10*3/uL (ref 0.7–4.0)
MCH: 29.7 pg (ref 26.0–34.0)
MCV: 84.1 fL (ref 78.0–100.0)
Monocytes Relative: 6 % (ref 3–12)
Platelets: 232 10*3/uL (ref 150–400)
RBC: 4.92 MIL/uL (ref 3.87–5.11)
WBC: 5.4 10*3/uL (ref 4.0–10.5)

## 2012-06-19 MED ORDER — HYDROCOD POLST-CHLORPHEN POLST 10-8 MG/5ML PO LQCR
5.0000 mL | Freq: Once | ORAL | Status: AC
  Administered 2012-06-19: 5 mL via ORAL
  Filled 2012-06-19: qty 5

## 2012-06-19 MED ORDER — SODIUM CHLORIDE 0.9 % IV BOLUS (SEPSIS)
1000.0000 mL | Freq: Once | INTRAVENOUS | Status: AC
  Administered 2012-06-19: 1000 mL via INTRAVENOUS

## 2012-06-19 MED ORDER — PREDNISONE 20 MG PO TABS
60.0000 mg | ORAL_TABLET | Freq: Once | ORAL | Status: AC
  Administered 2012-06-19: 60 mg via ORAL
  Filled 2012-06-19: qty 3

## 2012-06-19 MED ORDER — ALBUTEROL SULFATE (5 MG/ML) 0.5% IN NEBU
5.0000 mg | INHALATION_SOLUTION | Freq: Once | RESPIRATORY_TRACT | Status: AC
  Administered 2012-06-19: 5 mg via RESPIRATORY_TRACT
  Filled 2012-06-19: qty 1

## 2012-06-19 MED ORDER — HYDROCODONE-HOMATROPINE 5-1.5 MG/5ML PO SYRP: 5.0000 mL | ORAL_SOLUTION | Freq: Four times a day (QID) | ORAL | Status: DC | PRN

## 2012-06-19 MED ORDER — PREDNISONE 20 MG PO TABS: 40.0000 mg | ORAL_TABLET | Freq: Every day | ORAL | Status: DC

## 2012-06-19 MED ORDER — ACETAMINOPHEN 325 MG PO TABS
650.0000 mg | ORAL_TABLET | Freq: Once | ORAL | Status: AC
  Administered 2012-06-19: 650 mg via ORAL
  Filled 2012-06-19: qty 1

## 2012-06-19 NOTE — ED Notes (Signed)
Pt has been coughing since working in yard yesterday. Has been using inhaler more than normal. Pt is a smoker. No resp distress noted

## 2012-06-19 NOTE — ED Notes (Signed)
Transported to X-ray
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
## 2012-06-19 NOTE — ED Provider Notes (Signed)
Medical screening examination/treatment/procedure(s) were performed by non-physician practitioner and as supervising physician I was immediately available for consultation/collaboration.   Dione Booze, MD 06/19/12 1106

## 2012-06-19 NOTE — ED Notes (Signed)
Pt states that yesterday she awoke with a cough, fever, and SOB.  No distress noted.  States that the phlegm is clear with some yellow.  Pt is able to talk in complete sentences.  Resp symmetrical and unlabored.  Skin warm and dry.

## 2012-06-20 ENCOUNTER — Telehealth (HOSPITAL_COMMUNITY): Payer: Self-pay | Admitting: Emergency Medicine

## 2012-06-20 NOTE — ED Notes (Signed)
Chart reviewed by Dr Jeraldine Loots.  Have pt get OTC medication for cough.  LVM for pt w/information.

## 2012-06-20 NOTE — ED Notes (Signed)
Pt called requesting change in Rx for Hycodan - not covered by Medicaid, too expensive.  Will have MD review .

## 2012-06-21 ENCOUNTER — Ambulatory Visit: Payer: Medicaid Other | Attending: Family Medicine | Admitting: Physical Therapy

## 2012-06-21 DIAGNOSIS — IMO0001 Reserved for inherently not codable concepts without codable children: Secondary | ICD-10-CM | POA: Insufficient documentation

## 2012-06-21 DIAGNOSIS — M25619 Stiffness of unspecified shoulder, not elsewhere classified: Secondary | ICD-10-CM | POA: Insufficient documentation

## 2012-06-21 DIAGNOSIS — R293 Abnormal posture: Secondary | ICD-10-CM | POA: Insufficient documentation

## 2012-06-21 DIAGNOSIS — M25519 Pain in unspecified shoulder: Secondary | ICD-10-CM | POA: Insufficient documentation

## 2012-06-27 ENCOUNTER — Encounter: Payer: Medicaid Other | Admitting: Physical Therapy

## 2012-07-05 ENCOUNTER — Ambulatory Visit: Payer: Medicaid Other

## 2012-07-12 ENCOUNTER — Ambulatory Visit: Payer: Medicaid Other

## 2012-07-14 ENCOUNTER — Encounter: Payer: Self-pay | Admitting: Family Medicine

## 2012-07-19 ENCOUNTER — Ambulatory Visit: Payer: Medicaid Other | Attending: Family Medicine

## 2012-07-19 DIAGNOSIS — R293 Abnormal posture: Secondary | ICD-10-CM | POA: Insufficient documentation

## 2012-07-19 DIAGNOSIS — IMO0001 Reserved for inherently not codable concepts without codable children: Secondary | ICD-10-CM | POA: Insufficient documentation

## 2012-07-19 DIAGNOSIS — M25619 Stiffness of unspecified shoulder, not elsewhere classified: Secondary | ICD-10-CM | POA: Insufficient documentation

## 2012-07-19 DIAGNOSIS — M25519 Pain in unspecified shoulder: Secondary | ICD-10-CM | POA: Insufficient documentation

## 2012-07-20 ENCOUNTER — Other Ambulatory Visit: Payer: Self-pay | Admitting: Family Medicine

## 2012-07-20 ENCOUNTER — Encounter: Payer: Self-pay | Admitting: Family Medicine

## 2012-07-20 ENCOUNTER — Ambulatory Visit (INDEPENDENT_AMBULATORY_CARE_PROVIDER_SITE_OTHER): Payer: Medicaid Other | Admitting: Family Medicine

## 2012-07-20 VITALS — BP 118/80 | Temp 98.3°F | Ht 66.0 in | Wt 209.0 lb

## 2012-07-20 DIAGNOSIS — IMO0001 Reserved for inherently not codable concepts without codable children: Secondary | ICD-10-CM

## 2012-07-20 DIAGNOSIS — Z309 Encounter for contraceptive management, unspecified: Secondary | ICD-10-CM

## 2012-07-20 DIAGNOSIS — Z79899 Other long term (current) drug therapy: Secondary | ICD-10-CM

## 2012-07-20 DIAGNOSIS — F259 Schizoaffective disorder, unspecified: Secondary | ICD-10-CM

## 2012-07-20 DIAGNOSIS — I10 Essential (primary) hypertension: Secondary | ICD-10-CM

## 2012-07-20 DIAGNOSIS — L209 Atopic dermatitis, unspecified: Secondary | ICD-10-CM

## 2012-07-20 DIAGNOSIS — M25511 Pain in right shoulder: Secondary | ICD-10-CM

## 2012-07-20 DIAGNOSIS — L2089 Other atopic dermatitis: Secondary | ICD-10-CM

## 2012-07-20 DIAGNOSIS — M25519 Pain in unspecified shoulder: Secondary | ICD-10-CM

## 2012-07-20 DIAGNOSIS — E663 Overweight: Secondary | ICD-10-CM | POA: Insufficient documentation

## 2012-07-20 DIAGNOSIS — F17200 Nicotine dependence, unspecified, uncomplicated: Secondary | ICD-10-CM

## 2012-07-20 HISTORY — DX: Overweight: E66.3

## 2012-07-20 LAB — LIPID PANEL
Cholesterol: 203 mg/dL — ABNORMAL HIGH (ref 0–200)
HDL: 43 mg/dL (ref >39)
LDL Cholesterol: 138 mg/dL — ABNORMAL HIGH (ref 0–99)
Triglycerides: 108 mg/dL (ref <150)
VLDL: 22 mg/dL (ref 0–40)

## 2012-07-20 MED ORDER — KETOROLAC TROMETHAMINE 30 MG/ML IJ SOLN
30.0000 mg | Freq: Once | INTRAMUSCULAR | Status: AC
  Administered 2012-07-20: 30 mg via INTRAMUSCULAR

## 2012-07-20 MED ORDER — FLUOCINONIDE 0.1 % EX CREA: TOPICAL_CREAM | CUTANEOUS | Status: DC

## 2012-07-20 MED ORDER — MEDROXYPROGESTERONE ACETATE 150 MG/ML IM SUSP
150.0000 mg | Freq: Once | INTRAMUSCULAR | Status: AC
  Administered 2012-07-20: 150 mg via INTRAMUSCULAR

## 2012-07-20 NOTE — Patient Instructions (Signed)
Stop the triamcinolone cream for your atopic dematitis. Start Fluocinonide cream for the skin rashes of your nape of neck, back, cleavage, and hand. Use it once daily.  We are checking your cholesterol panel today to monitor your cholesterol response to your medications.

## 2012-07-21 ENCOUNTER — Encounter: Payer: Self-pay | Admitting: Family Medicine

## 2012-07-21 NOTE — Assessment & Plan Note (Signed)
Adequate blood pressure control.  No evidence of new end organ damage.  Tolerating medication without significant adverse effects.  Plan to continue current blood pressure regiment.   

## 2012-07-21 NOTE — Assessment & Plan Note (Signed)
Exacerbation of chronic shoulder pain on right. Rx toradol 30 mg IM x 1.   Advised to restart her home shoulder exercises and neck stretching exercises.

## 2012-07-21 NOTE — Assessment & Plan Note (Signed)
Plaque formation on nape of neck with evidence of neurotic excoriation with secondary hyperpigmentation. Patches with Locations on nape of neck , cleavage and mid thoracic back and right hand dorsum  Plan: Increase potency to Fluocinonide daily to thicken plaques of neck nape.  Triamcinolone 0.1% between breast daily until resolved. Avoidance of scratching and emollienct to skin frequently.  Can try calcineurin in skin folds.

## 2012-07-21 NOTE — Assessment & Plan Note (Signed)
Contemplative.  No plan for smoking cessation.  Will consider in future. Advised patient on availability of assistance.

## 2012-07-21 NOTE — Progress Notes (Signed)
  Subjective:    Patient ID: [name], female    DOB: [birth year], 32 y.o.   MRN: [id number]  Psoriasis  Hip Pain   Shoulder Pain    Right shoulder pain Persistent No radiation No neck pain Ongoing for months Normal arthroscopy MRI right shoulder 12/2011: Os acromiale and subacromial bursitis.  Aching. Worse with activity. Not improved with otc nsaid No weakness in arm No trauma  Left hip pain Left buttock No radiation Aching No trauma No leg weakness No improvement with otc nsaid. No weakness in leg.  RASH  Location: nape of neck  Onset: months ago, with thickening of plaque on neck  Course: recent worsening Self-treated with: triamcinolone and hydrocortisone.             Improvement with treatment: no  History Pruritis: yes, with scratching until bleeds  Tenderness: no  New medications/antibiotics: no  Tick/insect/pet exposure: no  Recent travel: no  New detergent, new clothing, or other topical exposure: no   Red Flags Feeling ill: no  Fever: no  Mouth lesions: no  Facial/tongue swelling/difficulty breathing:  no  Diabetic or immunocompromised: no     SH: unable to afford complains of-pay for Lithium.  Has not taken it in 3 weeks.  Report feeling more agitated and angry off Lithium.  Wants to restart.  Smoking but not ready to quit.  Eventually would like to quit.   Review of Systems No headache No weight loss No joint swelling      Objective:   Physical Exam  Constitutional: She appears well-nourished. No distress.  Eyes: Conjunctivae are normal.  Neck: Normal range of motion and full passive range of motion without pain. Neck supple. No mass and no thyromegaly present.  Cardiovascular: Normal rate, regular rhythm, S1 normal and normal heart sounds.   Pulmonary/Chest: Effort normal and breath sounds normal.  Musculoskeletal:       Right shoulder: She exhibits tenderness and pain. She exhibits no bony tenderness, no swelling, no  effusion, no deformity, normal pulse and normal strength.       Left upper leg: She exhibits no swelling, no edema and no deformity.       Legs: Neurological: She has normal strength. Gait normal.  Reflex Scores:      Bicep reflexes are 1+ on the right side and 1+ on the left side.      Patellar reflexes are 1+ on the right side and 1+ on the left side.      Achilles reflexes are 1+ on the right side and 1+ on the left side.         Assessment & Plan:

## 2012-07-21 NOTE — Assessment & Plan Note (Signed)
Advised importance of restarting her Lithium therapy.

## 2012-08-11 ENCOUNTER — Ambulatory Visit (INDEPENDENT_AMBULATORY_CARE_PROVIDER_SITE_OTHER): Payer: Medicaid Other | Admitting: *Deleted

## 2012-08-11 DIAGNOSIS — M25519 Pain in unspecified shoulder: Secondary | ICD-10-CM

## 2012-08-11 MED ORDER — KETOROLAC TROMETHAMINE 30 MG/ML IJ SOLN
30.0000 mg | Freq: Once | INTRAMUSCULAR | Status: AC
  Administered 2012-08-11: 30 mg via INTRAMUSCULAR

## 2012-08-11 NOTE — Progress Notes (Signed)
Pt here today for toradol 30 mg shot per standing weekly order of Dr. Perley Jain. Tolerated well - No further issues or concerns.Wyatt Haste, RN-BSN

## 2012-08-21 ENCOUNTER — Ambulatory Visit (INDEPENDENT_AMBULATORY_CARE_PROVIDER_SITE_OTHER): Payer: Medicaid Other | Admitting: *Deleted

## 2012-08-21 ENCOUNTER — Encounter: Payer: Self-pay | Admitting: *Deleted

## 2012-08-21 ENCOUNTER — Ambulatory Visit: Payer: Medicaid Other

## 2012-08-21 DIAGNOSIS — M25519 Pain in unspecified shoulder: Secondary | ICD-10-CM

## 2012-08-21 MED ORDER — KETOROLAC TROMETHAMINE 30 MG/ML IJ SOLN
30.0000 mg | Freq: Once | INTRAMUSCULAR | Status: AC
  Administered 2012-08-21: 30 mg via INTRAMUSCULAR

## 2012-08-21 NOTE — Progress Notes (Signed)
Pt here today for weekly toradol 30 mg injection standing order per Dr. McDirmid. Tolerated well.  Wyatt Haste, RN-BSN

## 2012-08-22 ENCOUNTER — Ambulatory Visit (INDEPENDENT_AMBULATORY_CARE_PROVIDER_SITE_OTHER): Payer: Medicaid Other | Admitting: Family Medicine

## 2012-08-22 VITALS — BP 125/86 | HR 83 | Temp 98.1°F | Wt 201.0 lb

## 2012-08-22 DIAGNOSIS — M25519 Pain in unspecified shoulder: Secondary | ICD-10-CM

## 2012-08-22 DIAGNOSIS — M25511 Pain in right shoulder: Secondary | ICD-10-CM

## 2012-08-22 MED ORDER — CYCLOBENZAPRINE HCL 5 MG PO TABS: 5.0000 mg | ORAL_TABLET | Freq: Three times a day (TID) | ORAL | Status: DC | PRN

## 2012-08-22 MED ORDER — HYDROCODONE-ACETAMINOPHEN 10-325 MG PO TABS: 1.0000 | ORAL_TABLET | Freq: Three times a day (TID) | ORAL | Status: DC | PRN

## 2012-08-22 NOTE — Assessment & Plan Note (Signed)
Unsure of etiology of pain. She has had a full work up including surgery. I suspect that she may have a component of muscle spasm from protecting her shoulder contributing to her pain. Discussed in length the importance of shoulder exercising and home rehab. Also, I have informed patient that I do not prescribe long term pain medications however, I did prescribe Flexeril #20 and Vicodin #10. If patient's shoulder does not improve, she will likely need to be referred back to specialist for further investigation into her pain.

## 2012-08-22 NOTE — Progress Notes (Signed)
Patient ID: SENECA GADBOIS, female   DOB: June 22, 1980, 32 y.o.   MRN: [id number]  [facility] Mingo Siegert M. Lavi Sheehan, MD Phone: [phone]   Subjective: HPI: Patient is a 32 y.o. female presenting to clinic today for same day visit for shoulder pain.  Patient has been assessed by sports medicine as well as ortho. She had shoulder arthroscopy within the last year (November 2013) which was normal. She is getting weekly Toradol injections in this clinic. She did go to PT for 4 allowed visits.  She states that the pain is a sharp pain, toradol did not help. Pain is now posterior shoulder and shoulder blade. Using tramadol at home which also does not help. She uses Voltaren gel and hot/cold compresses. The pain is dulled but still persistent. Massage helps some too. Pain is worse with gripping or raising shoulder. Pain radiates to right side of neck.   History Reviewed: Daily smoker; ready to quit. Trying to wean herself from 1/2 ppd Health Maintenance: UTD  ROS: Please see HPI above.  Objective: Office vital signs reviewed. There were no vitals taken for this visit.  Physical Examination:  General: Awake, alert. NAD. Very pleasant HEENT: Atraumatic, normocephalic Extremities: No edema. Able to move shoulder in all directions but slowly. Appears to have good strength and a stable joint. Pain most severe with empty can test. TTP of paraspinal thoracic muscles as well as shoulder blade.  Neuro: Grossly intact  Assessment: 32 y.o. female with shoulder pain  Plan: See Problem List and After Visit Summary

## 2012-08-22 NOTE — Patient Instructions (Addendum)
It was good to meet you today. I am sorry your shoulder is still bothering you.  I have given pain pill as well as a muscle relaxer. Take these only as needed and when you can rest. If your shoulder does not improve with the medication and the home exercises, let me know.  Amber M. Hairford, M.D.

## 2012-08-25 NOTE — Telephone Encounter (Signed)
This encounter was created in error - please disregard.

## 2012-11-16 ENCOUNTER — Encounter: Payer: Self-pay | Admitting: Family Medicine

## 2012-11-16 ENCOUNTER — Ambulatory Visit (INDEPENDENT_AMBULATORY_CARE_PROVIDER_SITE_OTHER): Payer: Medicaid Other | Admitting: Family Medicine

## 2012-11-16 VITALS — BP 118/80 | HR 73 | Temp 98.1°F | Ht 66.0 in | Wt 205.0 lb

## 2012-11-16 DIAGNOSIS — M479 Spondylosis, unspecified: Secondary | ICD-10-CM

## 2012-11-16 DIAGNOSIS — B368 Other specified superficial mycoses: Secondary | ICD-10-CM

## 2012-11-16 DIAGNOSIS — B362 White piedra: Secondary | ICD-10-CM

## 2012-11-16 DIAGNOSIS — M25519 Pain in unspecified shoulder: Secondary | ICD-10-CM

## 2012-11-16 DIAGNOSIS — R21 Rash and other nonspecific skin eruption: Secondary | ICD-10-CM

## 2012-11-16 DIAGNOSIS — Z309 Encounter for contraceptive management, unspecified: Secondary | ICD-10-CM

## 2012-11-16 DIAGNOSIS — Z79899 Other long term (current) drug therapy: Secondary | ICD-10-CM

## 2012-11-16 DIAGNOSIS — Z23 Encounter for immunization: Secondary | ICD-10-CM

## 2012-11-16 DIAGNOSIS — I10 Essential (primary) hypertension: Secondary | ICD-10-CM

## 2012-11-16 DIAGNOSIS — M25511 Pain in right shoulder: Secondary | ICD-10-CM

## 2012-11-16 LAB — LIPID PANEL
Cholesterol: 188 mg/dL (ref 0–200)
LDL Cholesterol: 121 mg/dL — ABNORMAL HIGH (ref 0–99)
Triglycerides: 100 mg/dL (ref <150)
VLDL: 20 mg/dL (ref 0–40)

## 2012-11-16 LAB — BASIC METABOLIC PANEL
BUN: 6 mg/dL (ref 6–23)
Creat: 0.87 mg/dL (ref 0.50–1.10)
Glucose, Bld: 75 mg/dL (ref 70–99)
Potassium: 4.1 mEq/L (ref 3.5–5.3)

## 2012-11-16 MED ORDER — CETIRIZINE HCL 10 MG PO TABS: 10.0000 mg | ORAL_TABLET | Freq: Every day | ORAL | Status: DC

## 2012-11-16 MED ORDER — CYCLOBENZAPRINE HCL 5 MG PO TABS: 5.0000 mg | ORAL_TABLET | Freq: Three times a day (TID) | ORAL | Status: DC | PRN

## 2012-11-16 MED ORDER — CLOTRIMAZOLE-BETAMETHASONE 1-0.05 % EX CREA: TOPICAL_CREAM | Freq: Two times a day (BID) | CUTANEOUS | Status: DC

## 2012-11-16 MED ORDER — OMEPRAZOLE 40 MG PO CPDR: 40.0000 mg | DELAYED_RELEASE_CAPSULE | Freq: Two times a day (BID) | ORAL | Status: DC

## 2012-11-16 MED ORDER — KETOROLAC TROMETHAMINE 30 MG/ML IJ SOLN
30.0000 mg | Freq: Once | INTRAMUSCULAR | Status: AC
  Administered 2012-11-16: 30 mg via INTRAMUSCULAR

## 2012-11-16 MED ORDER — AMITRIPTYLINE HCL 75 MG PO TABS: 75.0000 mg | ORAL_TABLET | Freq: Every day | ORAL | Status: DC

## 2012-11-16 MED ORDER — MEDROXYPROGESTERONE ACETATE 150 MG/ML IM SUSP
150.0000 mg | Freq: Once | INTRAMUSCULAR | Status: AC
  Administered 2012-11-16: 150 mg via INTRAMUSCULAR

## 2012-11-16 NOTE — Assessment & Plan Note (Signed)
Will check for metabolic dysfunction from atypical antipsychotic medications with BMET and Lipid panel

## 2012-11-16 NOTE — Assessment & Plan Note (Signed)
Possible, new diagnosis Pruritic rash with elevated erythematous partial annular. KOH negative for hyphae but suspect appearance of lesion is such that still suspect Tinea corporis.  Recommend Stop topical steroid Trial of Clotrimazole cream twice a day to lesion for 4 weeks.

## 2012-11-16 NOTE — Progress Notes (Signed)
Patient ID: [name], female   DOB: Aug 26, 1980, 32 y.o.   MRN: [id number]  Subjective:    Patient ID: [name], female    DOB: Apr 04, 1980, 32 y.o.   MRN: [id number]  Shoulder Pain   Psoriasis  Hip Pain    Right shoulder pain Persistent No radiation No neck pain Ongoing for months Normal arthroscopy MRI right shoulder 12/2011: Os acromiale and subacromial bursitis.  Aching. Worse with activity. Improved with Flexeril and Vicodin pt prescribed by Dr Mikel Cella in June.  No weakness in arm No trauma  RASH  Location: nape of neck  Onset: around March 14.  Course: recent worsening Self-treated with: triamcinolone and hydrocortisone.             Improvement with treatment: no  History Pruritis: yes, with scratching until bleeds  Tenderness: no  New medications/antibiotics: no  Tick/insect/pet exposure: no  Recent travel: no  New detergent, new clothing, or other topical exposure: no   Red Flags Feeling ill: no  Fever: no  Mouth lesions: no  Facial/tongue swelling/difficulty breathing:  no  Diabetic or immunocompromised: no   Bipolar DO Changed to IM Invega and an SSRI by [employer].   SH:   Smoking but not ready to quit.  Eventually would like to quit.   Review of Systems No headache No weight loss No joint swelling      Objective:   Physical Exam  Constitutional: She appears well-nourished. No distress.  Eyes: Conjunctivae are normal.  Neck: Normal range of motion and full passive range of motion without pain. Neck supple. No mass and no thyromegaly present.  Cardiovascular: Normal rate, regular rhythm, S1 normal and normal heart sounds.   Pulmonary/Chest: Effort normal and breath sounds normal.  Musculoskeletal:       Right shoulder: She exhibits tenderness and pain. She exhibits no bony tenderness, no swelling, no effusion, no deformity, normal pulse and normal strength.       Left upper leg: She exhibits no swelling, no edema and no deformity.         Legs: Neurological: She has normal strength. Gait normal.  Reflex Scores:      Bicep reflexes are 1+ on the right side and 1+ on the left side.      Patellar reflexes are 1+ on the right side and 1+ on the left side.      Achilles reflexes are 1+ on the right side and 1+ on the left side.         Assessment & Plan:

## 2012-11-16 NOTE — Patient Instructions (Signed)
Treat the rash on the back of your neck with antifungal cream, clotrimazole (Lotrimin) by applying twice a day for next 4 weeks.  Do not apply any steroid creams to the rash.  This will prevent the clotrimazole from working.  Dr Maryem Shuffler will call you if your tests are not good. Otherwise he will send you a letter.  If you sign up for MyChart online, you will be able to see your test results once Dr Leviticus Harton has reviewed them.  If you do not hear from [place] with in 2 weeks please call our office  Will set up appointment with [facility] to discuss options around repeat  tubal ligation.

## 2012-11-16 NOTE — Assessment & Plan Note (Signed)
Adequate blood pressure control.  No evidence of new end organ damage.  Tolerating medication without significant adverse effects.  Plan to continue current blood pressure regiment.   

## 2012-11-16 NOTE — Assessment & Plan Note (Addendum)
Pt respoonded to use of cyclobenzaprine, particularly at bedtime.  Will refill for daily use at bedtime with additional tablets to allow intermittent as needed use up to threetimes a day. Given toradol 30 mg today IM x 1

## 2012-11-17 ENCOUNTER — Encounter: Payer: Self-pay | Admitting: Family Medicine

## 2012-11-17 ENCOUNTER — Other Ambulatory Visit: Payer: Self-pay | Admitting: Family Medicine

## 2012-11-17 DIAGNOSIS — Z3009 Encounter for other general counseling and advice on contraception: Secondary | ICD-10-CM

## 2012-11-17 DIAGNOSIS — IMO0001 Reserved for inherently not codable concepts without codable children: Secondary | ICD-10-CM

## 2012-11-17 DIAGNOSIS — Z9851 Tubal ligation status: Secondary | ICD-10-CM

## 2012-11-17 NOTE — Assessment & Plan Note (Signed)
S/P BTL in postpartum period after birth of last child in 2009 on [facility].

## 2013-01-10 ENCOUNTER — Ambulatory Visit (INDEPENDENT_AMBULATORY_CARE_PROVIDER_SITE_OTHER): Payer: Medicaid Other | Admitting: Obstetrics & Gynecology

## 2013-01-10 ENCOUNTER — Encounter: Payer: Self-pay | Admitting: Obstetrics & Gynecology

## 2013-01-10 VITALS — BP 113/85 | HR 103 | Temp 98.0°F | Ht 66.0 in | Wt 212.8 lb

## 2013-01-10 DIAGNOSIS — Z3009 Encounter for other general counseling and advice on contraception: Secondary | ICD-10-CM

## 2013-01-10 NOTE — Patient Instructions (Signed)
Sterilization Information, Female  Female sterilization is a procedure to permanently prevent pregnancy. There are different ways to perform sterilization, but all either block or close the fallopian tubes so that your eggs cannot reach your uterus. If your egg cannot reach your uterus, sperm cannot fertilize the egg, and you cannot get pregnant.   Sterilization is performed by a surgical procedure. Sometimes these procedures are performed in a hospital while a patient is asleep. Sometimes they can be done in a clinic setting with the patient awake. The fallopian tubes can be surgically cut, tied, or sealed through a procedure called tubal ligation. The fallopian tubes can also be closed with clips or rings. Sterilization can also be done by placing a tiny coil into each fallopian tube, which causes scar tissue to grow inside the tube. The scar tissue then blocks the tubes.   Discuss sterilization with your caregiver to answer any concerns you or your partner may have. You may want to ask what type of sterilization your caregiver performs. Some caregivers may not perform all the various options. Sterilization is permanent and should only be done if you are sure you do not want children or do not want any more children. Having a sterilization reversed may not be successful.   STERILIZATION PROCEDURES  · Laparoscopic sterilization. This is a surgical method performed at a time other than right after childbirth. Two incisions are made in the lower abdomen. A thin, lighted tube (laparoscope) is inserted into one of the incisions and is used to perform the procedure. The fallopian tubes are closed with a ring or a clip. An instrument that uses heat could be used to seal the tubes closed (electrocautery).    · Mini-laparotomy. This is a surgical method done 1 or 2 days after giving birth. Typically, a small incision is made just below the belly button (umbilicus) and the fallopian tubes are exposed. The tubes can then be  sealed, tied, or cut.    · Hysteroscopic sterilization. This is performed at a time other than right after childbirth. A tiny, spring-like coil is inserted through the cervix and uterus and placed into the fallopian tubes. The coil causes scaring and blocks the tubes. Other forms of contraception should be used for 3 months after the procedure to allow the scar tissue to form completely. Additionally, it is required hysterosalpingography be done 3 months later to ensure that the procedure was successful.  Hysterosalpingography is a procedure that uses X-rays to look at your uterus and fallopian tubes after a material to make them show up better has been inserted.  IS STERILIZATION SAFE?  Sterilization is considered safe with very rare complications. Risks depend on the type of procedure you have. As with any surgical procedure, there are risks. Some risks of sterilization by any means include:   · Bleeding.  · Infection.  · Reaction to anesthesia medicine.  · Injury to surrounding organs.  Risks specific to having hysteroscopic coils placed include:  · The coils may not be placed correctly the first time.     · The coils may move out of place.    · The tubes may not get completely blocked after 3 months.    · Injury to surrounding organs when placing the coil.    HOW EFFECTIVE IS FEMALE STERILIZATION?  Sterilization is nearly 100% effective, but it can fail. Depending on the type of sterilization, the rate of failure can be as high as 3%. After hysteroscopic sterilization with placement of fallopian tube coils, you will need back-up birth   control for 3 months after the procedure. Sterilization is effective for a lifetime.   BENEFITS OF STERILIZATION  · It does not affect your hormones, and therefore will not affect your menstrual periods, sexual desire, or performance.    · It is effective for a lifetime.    · It is safe.    · You do not need to worry about getting pregnant. Keep in mind that if you had the  hysteroscopic placement procedure, you must wait 3 months after the procedure (or until your caregiver confirms) before pregnancy is not considered possible.    · There are no side effects unlike other types of birth control (contraception).    DRAWBACKS OF STERILIZATION  · You must be sure you do not want children or any more children. The procedure is permanent.    · It does not provide protection against sexually transmitted infections (STIs).    · The tubes can grow back together. If this happens, there is a risk of pregnancy. There is also an increased risk (50%) of pregnancy being an ectopic pregnancy. This is a pregnancy that happens outside of the uterus.  Document Released: 08/18/2007 Document Revised: 08/31/2011 Document Reviewed: 06/17/2011  ExitCare® Patient Information ©2014 [employer].  Laparoscopic Tubal Ligation  Laparoscopic tubal ligation is a procedure that closes the fallopian tubes at a time other than right after childbirth. By closing the fallopian tubes, the eggs that are released from the ovaries cannot enter the uterus and sperm cannot reach the egg. Tubal ligation is also known as getting your "tubes tied." Tubal ligation is done so you will not be able to get pregnant or have a baby.   Although this procedure may be reversed, it should be considered permanent and irreversible. If you want to have future pregnancies, you should not have this procedure.   LET YOUR CAREGIVER KNOW ABOUT:  · Allergies to food or medicine.  · Medicines taken, including vitamins, herbs, eyedrops, over-the-counter medicines, and creams.  · Use of steroids (by mouth or creams).  · Previous problems with numbing medicines.  · History of bleeding problems or blood clots.  · Any recent colds or infections.  · Previous surgery.  · Other health problems, including diabetes and kidney problems.  · Possibility of pregnancy, if this applies.  · Any past pregnancies.  RISKS AND COMPLICATIONS    · Infection.  · Bleeding.  · Injury to surrounding organs.  · Anesthetic side effects.  · Failure of the procedure.  · Ectopic pregnancy.  · Future regret about having the procedure done.  BEFORE THE PROCEDURE  · Do not take aspirin or blood thinners a week before the procedure or as directed. This can cause bleeding.  · Do not eat or drink anything 6 to 8 hours before the procedure.  PROCEDURE   · You may be given a medicine to help you relax (sedative) before the procedure. You will be given a medicine to make you sleep (general anesthetic) during the procedure.  · A tube will be put down your throat to help your breath while under general anesthesia.  · Two small cuts (incisions) are made in the lower abdominal area and near the belly button.  · Your abdominal area will be inflated with a safe gas (carbon dioxide). This helps give the surgeon room to operate, visualize, and helps the surgeon avoid other organs.  · A thin, lighted tube (laparoscope) with a camera attached is inserted into your abdomen through one of the incisions near the belly button.   Other small instruments are also inserted through the other abdominal incision.  · The fallopian tubes are located and are either blocked with a ring, clip, or are burned (cauterized).  · After the fallopian tubes are blocked, the gas is released from the abdomen.  · The incisions will be closed with stitches (sutures), and a bandage may be placed over the incisions.  AFTER THE PROCEDURE   · You will rest in a recovery room for 1 4 hours until you are stable and doing well.  · You will also have some mild abdominal discomfort for 3 7 days. You will be given pain medicine to ease any discomfort.  · As long as there are no problems, you may be allowed to go home. Someone will need to drive you home and be with you for at least 24 hours once home.  · You may have some mild discomfort in the throat. This is from the tube placed in your throat while you were  sleeping.  · You may experience discomfort in the shoulder area from some trapped air between the liver and diaphragm. This sensation is normal and will slowly go away on its own.  Document Released: 06/07/2000 Document Revised: 08/31/2011 Document Reviewed: 06/12/2011  ExitCare® Patient Information ©2014 [employer].

## 2013-01-10 NOTE — Progress Notes (Signed)
Patient ID: [name], female   DOB: 1981-01-07, 32 y.o.   MRN: [id number] Patient desires surgical management with bilateral salpingectomy.  Title [place] papers signed today.  The risks of surgery were discussed in detail with the patient including but not limited to: bleeding which may require transfusion or reoperation; infection which may require prolonged hospitalization or re-hospitalization and antibiotic therapy; injury to bowel, bladder, ureters and major vessels or other surrounding organs; need for additional procedures including laparotomy; thromboembolic phenomenon, incisional problems and other postoperative or anesthesia complications.  Patient was told that the likelihood that her condition and symptoms will be treated effectively with this surgical management was very high; the postoperative expectations were also discussed in detail. The patient also understands the alternative treatment options which were discussed in full. All questions were answered.  She was told that she will be contacted by our surgical scheduler regarding the time and date of her surgery; routine preoperative instructions of having nothing to eat or drink after midnight on the day prior to surgery and also coming to the hospital 1 1/2 hours prior to her time of surgery were also emphasized.  She was told she may be called for a preoperative appointment about a week prior to surgery and will be given further preoperative instructions at that visit. Printed patient education handouts about the procedure were given to the patient to review at home.

## 2013-01-17 ENCOUNTER — Encounter: Payer: Self-pay | Admitting: *Deleted

## 2013-01-18 ENCOUNTER — Ambulatory Visit (INDEPENDENT_AMBULATORY_CARE_PROVIDER_SITE_OTHER): Payer: Medicaid Other | Admitting: *Deleted

## 2013-01-18 ENCOUNTER — Encounter: Payer: Self-pay | Admitting: Obstetrics & Gynecology

## 2013-01-18 DIAGNOSIS — M25519 Pain in unspecified shoulder: Secondary | ICD-10-CM

## 2013-01-18 DIAGNOSIS — M25511 Pain in right shoulder: Secondary | ICD-10-CM

## 2013-01-18 MED ORDER — KETOROLAC TROMETHAMINE 30 MG/ML IJ SOLN
30.0000 mg | Freq: Once | INTRAMUSCULAR | Status: AC
  Administered 2013-01-18: 30 mg via INTRAMUSCULAR

## 2013-01-18 NOTE — Progress Notes (Signed)
Patient in today for toradol injection. Injection given in the right deltoid, patient without complaints, site unremarkable.

## 2013-01-29 ENCOUNTER — Ambulatory Visit (INDEPENDENT_AMBULATORY_CARE_PROVIDER_SITE_OTHER): Payer: Medicaid Other | Admitting: Family Medicine

## 2013-01-29 ENCOUNTER — Encounter: Payer: Self-pay | Admitting: Family Medicine

## 2013-01-29 VITALS — BP 137/83 | HR 100 | Ht 66.0 in | Wt 227.0 lb

## 2013-01-29 DIAGNOSIS — M7050 Other bursitis of knee, unspecified knee: Secondary | ICD-10-CM

## 2013-01-29 DIAGNOSIS — M7052 Other bursitis of knee, left knee: Secondary | ICD-10-CM

## 2013-01-29 DIAGNOSIS — M76899 Other specified enthesopathies of unspecified lower limb, excluding foot: Secondary | ICD-10-CM

## 2013-01-29 DIAGNOSIS — M7650 Patellar tendinitis, unspecified knee: Secondary | ICD-10-CM | POA: Insufficient documentation

## 2013-01-29 DIAGNOSIS — M7051 Other bursitis of knee, right knee: Secondary | ICD-10-CM

## 2013-01-29 DIAGNOSIS — M7652 Patellar tendinitis, left knee: Secondary | ICD-10-CM

## 2013-01-29 HISTORY — DX: Other bursitis of knee, unspecified knee: M70.50

## 2013-01-29 MED ORDER — METHYLPREDNISOLONE ACETATE 40 MG/ML IJ SUSP
40.0000 mg | Freq: Once | INTRAMUSCULAR | Status: AC
  Administered 2013-01-29: 40 mg via INTRA_ARTICULAR

## 2013-01-29 NOTE — Patient Instructions (Signed)
Dear [name],   It was nice to meet you. I think that your knee pain is a combination of bursitis above the knee cap and patellar tendonitis. The steroid injection should reduce the inflammation. However, you still need to perform the exercises in the handout. Also, please use a compression sleeve for pain relief. Take ibuprofen for pain.   Once this feels better, you will need a regular exercise routine.   Come back in 4 weeks for repeat evaluation.   Sincerely,   Dr. Clinton Sawyer

## 2013-01-29 NOTE — Assessment & Plan Note (Signed)
Assessment: tendinitis of left patellar tendon without concern of tendon rupture Plan:  - Patient given instructions for exercises to complete at home for quad strengthening and tendon rehabilitation - NSAIDs when necessary - Follow up in 4 weeks

## 2013-01-29 NOTE — Progress Notes (Signed)
  Subjective:    Patient ID: [name], female    DOB: 1980/07/30, 32 y.o.   MRN: [id number]  HPI  32 year old F with schizoaffective disorder and obesity who presents for evaluation of left knee pain.   Obesity - Patient has gained 50 lbs in last year; Is concerned that psychiatric medications are causing weight gain, taking brintellix and invega depo form b/c she was missing doses of her PO psychiatric medication which caused suicidal thoughts   Knee Pain:  > Laterality: left worse than right > Onset: several weeks ago after cheering for alumni football game > Course: worsening > Character: shooting pain > Associated Symptoms: feels like it may "give out" > Alleviating: nothing, tried voltaren gel, flexeril and vicodin  > Exacerbating: walking up stairs > Hx of injury: no > Hx of imaging: none > No regular exercise  Review of Systems See HPI    Objective:   Physical Exam BP 137/83  Pulse 100  Ht 5\' 6"  (1.676 m)  Wt 227 lb (102.967 kg)  BMI 36.66 kg/m2 Gen: Knee Exam:  Laterality: left Appearance:  Edema: yes superior patellar region  Tenderness: yes  entire area, superior worse  Range of Motion: Passive Extension:normal Flexion:normal Active Extension: limited by pain at 165 degrees Flexion: normal Laxity: none Maneuvers: Lachman's: negative McMurray's: negative Patellar Compression: POSITIVE  Strength:  Quadricep: 4/5 - limited by pain Hamstring: 5/5 Gait: antalgic on left           Assessment & Plan:   Knee Arthrocentesis with Injection Procedure Note  Pre-operative Diagnosis: left suprapatellar bursitis  Post-operative Diagnosis: same  Indications: Symptomatic relief of large effusion  Anesthesia: Anesthetic spray   Procedure Details   Verbal consent was obtained for the procedure. Time out performed. The joint was prepped with Betadine. A 21 gauge needle was inserted into the superior aspect of the joint from a lateral approach. 0 ml of  fluid was removed from the joint. 3 ml 1% lidocaine and 1 ml of depomedrol 40mg /ml was then injected into the joint through the same needle. The needle was removed and the area cleansed and dressed.  Complications:  None; patient tolerated the procedure well.

## 2013-01-29 NOTE — Assessment & Plan Note (Signed)
Assessment: vigorous physical activity in the setting of otherwise sedentary lifestyle precipitated to patella bursitis that is persistently inflamed and causing pain Plan: attempted aspiration of suprapatellar bursa unsuccessful, injected with steroids and lidocaine and hope to reduce swelling and improve function status

## 2013-01-29 NOTE — Addendum Note (Signed)
Addended byArlyss Repress on: 01/29/2013 05:11 PM   Modules accepted: Orders

## 2013-02-12 ENCOUNTER — Ambulatory Visit (INDEPENDENT_AMBULATORY_CARE_PROVIDER_SITE_OTHER): Payer: Medicaid Other | Admitting: Family Medicine

## 2013-02-12 DIAGNOSIS — F259 Schizoaffective disorder, unspecified: Secondary | ICD-10-CM

## 2013-02-12 DIAGNOSIS — Z8742 Personal history of other diseases of the female genital tract: Secondary | ICD-10-CM

## 2013-02-12 DIAGNOSIS — I10 Essential (primary) hypertension: Secondary | ICD-10-CM

## 2013-02-12 DIAGNOSIS — E669 Obesity, unspecified: Secondary | ICD-10-CM

## 2013-02-12 DIAGNOSIS — M25561 Pain in right knee: Secondary | ICD-10-CM

## 2013-02-12 DIAGNOSIS — N920 Excessive and frequent menstruation with regular cycle: Secondary | ICD-10-CM

## 2013-02-12 DIAGNOSIS — M25569 Pain in unspecified knee: Secondary | ICD-10-CM

## 2013-02-12 DIAGNOSIS — Z79899 Other long term (current) drug therapy: Secondary | ICD-10-CM

## 2013-02-12 DIAGNOSIS — F17200 Nicotine dependence, unspecified, uncomplicated: Secondary | ICD-10-CM

## 2013-02-12 LAB — LDL CHOLESTEROL, DIRECT: Direct LDL: 123 mg/dL — ABNORMAL HIGH

## 2013-02-12 MED ORDER — CLOTRIMAZOLE 1 % EX CREA: 1.0000 "application " | TOPICAL_CREAM | Freq: Two times a day (BID) | CUTANEOUS | Status: DC

## 2013-02-12 NOTE — Patient Instructions (Signed)
Stop all creams on your skin. Start Clotrimazole on the rash on back of your neck and on your arm.

## 2013-02-13 ENCOUNTER — Encounter: Payer: Self-pay | Admitting: Family Medicine

## 2013-02-13 DIAGNOSIS — M25561 Pain in right knee: Secondary | ICD-10-CM

## 2013-02-13 HISTORY — DX: Pain in right knee: M25.561

## 2013-02-13 MED ORDER — MEDROXYPROGESTERONE ACETATE 150 MG/ML IM SUSP
150.0000 mg | Freq: Once | INTRAMUSCULAR | Status: AC
  Administered 2013-02-12: 150 mg via INTRAMUSCULAR

## 2013-02-13 NOTE — Progress Notes (Signed)
Patient ID: [name], female   DOB: [birth year], 32 y.o.   MRN: [id number] Patient ID: [name], female   DOB: 01-22-1981, 32 y.o.   MRN: [id number]  Subjective:    Patient ID: [name], female    DOB: 05-Apr-1980, 32 y.o.   MRN: [id number]  RASH  Location: nape of neck  Onset: around March 14.  Course: recent worsening Self-treated with: triamcinolone and hydrocortisone.             Improvement with treatment: no  History Pruritis: yes, Tenderness: no  New medications/antibiotics: no  Tick/insect/pet exposure: no  Recent travel: no  New detergent, new clothing, or other topical exposure: no   Red Flags Feeling ill: no  Fever: no  Mouth lesions: no  Facial/tongue swelling/difficulty breathing:  no  Diabetic or immunocompromised: no   Bipolar DO/Schizoaffective Longstanding problem for years On IM Invega and an antidepressant Vortioxetine by [employer]. Patient missed her last appointment with [facility].  Next appoinment is in January 2015. Pt reports social withdrawal as she does not feel comfortable in crowds.  She reports thoughts that strangers are following her, disapproving of her She still hears voices in the background that are not there.  They often talk about her or talk about how other people disapprove of her. [name] denies thoughts of harming herself and others. Her self-esteem is low; she is especially bothered by her recent weight gain/ She feels supported by her husband.  She will not go out in public unless [name] is with her.  Contraception She is anticipating having saphingectomies this month for permanent sterilization after failure of her BTL recently. She is anticipating losing some weight once she can stop the Depoprovera.    Right Knee pain Onset over a year ago Worse with recent increase in weight Taking apap and nsaid intermittently that helps.  Feels popping in knee, without pain.  Sometimes pain in knee interferes with shopping  or going to public events.      SH:   Smoking but not ready to quit.  Eventually would like to quit.   Review of Systems No headache No weight loss No joint swelling      Objective:   Physical Exam  Constitutional: She appears in No distress.  Obese (truncal) Eyes: Conjunctivae are normal.  Neck: Normal range of motion and full passive range of motion without pain. Neck supple. No mass and no thyromegaly present.  Cardiovascular: Normal rate, regular rhythm, S1 normal and normal heart sounds.   Pulmonary/Chest: Effort normal and breath sounds normal.  Musculoskeletal:            Right knee She exhibits no swelling, no edema and no deformity. (+) Crepitus.   Neurological: She has normal strength. Gait normal. Normal Rhomberg.  Reflex Scores:      Bicep reflexes are 1+ on the right side and 1+ on the left side.      Patellar reflexes are 1+ on the right side and 1+ on the left side.      Achilles reflexes are 1+ on the right side and 1+ on the left side.         Assessment & Plan:

## 2013-02-13 NOTE — Assessment & Plan Note (Signed)
Adequate blood pressure control.  No evidence of new end organ damage.  Tolerating medication without significant adverse effects.  Plan to continue current blood pressure regiment.   

## 2013-02-13 NOTE — Assessment & Plan Note (Signed)
Ongoing auditory hallucinations that can be persecutory, but not commanding. No thoughts of harming self or others.  Is taking her medications. Gaining weight on psychotropes. Normal recent glucose and LDL.

## 2013-02-13 NOTE — Assessment & Plan Note (Signed)
Wt Readings from Last 3 Encounters:  01/29/13 227 lb (102.967 kg)  01/10/13 212 lb 12.8 oz (96.525 kg)  11/16/12 205 lb (92.987 kg)   Significant weight gain over last 3 months.  Anticipating stopping Depoprovera soon.  Could try trial stopping the Amitriptyline for chronic pain soon.   Discuss referral to nutrition at next office visit.

## 2013-02-13 NOTE — Assessment & Plan Note (Signed)
Not currently interested in cessation.  Will continue to address.

## 2013-02-15 ENCOUNTER — Ambulatory Visit: Payer: Medicaid Other | Admitting: Obstetrics & Gynecology

## 2013-02-15 ENCOUNTER — Encounter: Payer: Self-pay | Admitting: Obstetrics & Gynecology

## 2013-02-15 NOTE — Progress Notes (Signed)
Patient ID: [name], female   DOB: December 25, 1980, 32 y.o.   MRN: [id number] Exam cancelled

## 2013-02-20 ENCOUNTER — Encounter (HOSPITAL_COMMUNITY): Payer: Self-pay | Admitting: Pharmacy Technician

## 2013-02-26 ENCOUNTER — Encounter (HOSPITAL_COMMUNITY): Payer: Self-pay

## 2013-02-26 ENCOUNTER — Encounter (HOSPITAL_COMMUNITY)
Admission: RE | Admit: 2013-02-26 | Discharge: 2013-02-26 | Disposition: A | Discharge status: Home / Self Care | Payer: Medicaid Other | Source: Ambulatory Visit | Attending: Obstetrics & Gynecology | Admitting: Obstetrics & Gynecology

## 2013-02-26 DIAGNOSIS — Z302 Encounter for sterilization: Secondary | ICD-10-CM | POA: Diagnosis present

## 2013-02-26 LAB — CBC
Hemoglobin: 13.5 g/dL (ref 12.0–15.0)
MCV: 86.6 fL (ref 78.0–100.0)
Platelets: 272 10*3/uL (ref 150–400)
RBC: 4.54 MIL/uL (ref 3.87–5.11)
WBC: 8.7 10*3/uL (ref 4.0–10.5)

## 2013-02-26 LAB — BASIC METABOLIC PANEL
CO2: 24 mEq/L (ref 19–32)
Calcium: 9.6 mg/dL (ref 8.4–10.5)
Chloride: 103 mEq/L (ref 96–112)
Glucose, Bld: 84 mg/dL (ref 70–99)
Potassium: 3.9 mEq/L (ref 3.5–5.1)
Sodium: 138 mEq/L (ref 135–145)

## 2013-02-26 NOTE — Patient Instructions (Signed)
20 MARITA BURNSED  02/26/2013   Your procedure is scheduled on:  03/01/13  Enter through the Main Entrance of [facility] at 6 AM.  Pick up the phone at the desk and dial [phone].   Call this number if you have problems the morning of surgery: [phone]   Remember:   Do not eat food:After Midnight.  Do not drink clear liquids: After Midnight.  Take these medicines the morning of surgery with A SIP OF WATER: Blood pressure medication, Prilosec, bring inhaler   Do not wear jewelry, make-up or nail polish.  Do not wear lotions, powders, or perfumes. You may wear deodorant.  Do not shave 48 hours prior to surgery.  Do not bring valuables to the hospital.  [facility] is not   responsible for any belongings or valuables brought to the hospital.  Contacts, dentures or bridgework may not be worn into surgery.  Leave suitcase in the car. After surgery it may be brought to your room.  For patients admitted to the hospital, checkout time is 11:00 AM the day of              discharge.   Patients discharged the day of surgery will not be allowed to drive             home.  Name and phone number of your driver: undecided  Special Instructions:   Shower using CHG 2 nights before surgery and the night before surgery.  If you shower the day of surgery use CHG.  Use special wash - you have one bottle of CHG for all showers.  You should use approximately 1/3 of the bottle for each shower.   Please read over the following fact sheets that you were given:   Surgical Site Infection Prevention

## 2013-03-01 ENCOUNTER — Ambulatory Visit (HOSPITAL_COMMUNITY)
Admission: RE | Admit: 2013-03-01 | Discharge: 2013-03-01 | Disposition: A | Discharge status: Home / Self Care | Payer: Medicaid Other | Source: Ambulatory Visit | Attending: Obstetrics & Gynecology | Admitting: Obstetrics & Gynecology

## 2013-03-01 ENCOUNTER — Ambulatory Visit (HOSPITAL_COMMUNITY): Payer: Medicaid Other | Admitting: Anesthesiology

## 2013-03-01 ENCOUNTER — Encounter (HOSPITAL_COMMUNITY): Payer: Medicaid Other | Admitting: Anesthesiology

## 2013-03-01 ENCOUNTER — Encounter (HOSPITAL_COMMUNITY): Payer: Self-pay | Admitting: Anesthesiology

## 2013-03-01 ENCOUNTER — Encounter (HOSPITAL_COMMUNITY)
Admission: RE | Disposition: A | Discharge status: Home / Self Care | Payer: Self-pay | Source: Ambulatory Visit | Attending: Obstetrics & Gynecology

## 2013-03-01 DIAGNOSIS — Z302 Encounter for sterilization: Secondary | ICD-10-CM | POA: Diagnosis not present

## 2013-03-01 HISTORY — PX: LAPAROSCOPIC BILATERAL SALPINGECTOMY: SHX5889

## 2013-03-01 LAB — PREGNANCY, URINE: Preg Test, Ur: NEGATIVE

## 2013-03-01 SURGERY — SALPINGECTOMY, BILATERAL, LAPAROSCOPIC
Anesthesia: General | Site: Abdomen

## 2013-03-01 MED ORDER — PROPOFOL 10 MG/ML IV EMUL
INTRAVENOUS | Status: AC
  Filled 2013-03-01: qty 20

## 2013-03-01 MED ORDER — FENTANYL CITRATE 0.05 MG/ML IJ SOLN
INTRAMUSCULAR | Status: AC
  Filled 2013-03-01: qty 2

## 2013-03-01 MED ORDER — ROCURONIUM BROMIDE 100 MG/10ML IV SOLN
INTRAVENOUS | Status: AC
  Filled 2013-03-01: qty 1

## 2013-03-01 MED ORDER — ONDANSETRON HCL 4 MG/2ML IJ SOLN
INTRAMUSCULAR | Status: AC
  Filled 2013-03-01: qty 2

## 2013-03-01 MED ORDER — FENTANYL CITRATE 0.05 MG/ML IJ SOLN
25.0000 ug | INTRAMUSCULAR | Status: DC | PRN
  Administered 2013-03-01 (×2): 50 ug via INTRAVENOUS

## 2013-03-01 MED ORDER — FENTANYL CITRATE 0.05 MG/ML IJ SOLN
INTRAMUSCULAR | Status: AC
  Administered 2013-03-01: 50 ug via INTRAVENOUS
  Filled 2013-03-01: qty 2

## 2013-03-01 MED ORDER — DEXAMETHASONE SODIUM PHOSPHATE 10 MG/ML IJ SOLN
INTRAMUSCULAR | Status: AC
  Filled 2013-03-01: qty 1

## 2013-03-01 MED ORDER — METOCLOPRAMIDE HCL 5 MG/ML IJ SOLN: 10.0000 mg | Freq: Once | INTRAMUSCULAR | Status: DC | PRN

## 2013-03-01 MED ORDER — ACETAMINOPHEN 160 MG/5ML PO SOLN
ORAL | Status: AC
  Administered 2013-03-01: 975 mg via ORAL
  Filled 2013-03-01: qty 20.3

## 2013-03-01 MED ORDER — OXYCODONE-ACETAMINOPHEN 5-325 MG PO TABS
ORAL_TABLET | ORAL | Status: AC
  Filled 2013-03-01: qty 1

## 2013-03-01 MED ORDER — OXYCODONE-ACETAMINOPHEN 5-325 MG PO TABS
1.0000 | ORAL_TABLET | Freq: Once | ORAL | Status: AC
  Administered 2013-03-01: 1 via ORAL

## 2013-03-01 MED ORDER — ACETAMINOPHEN 160 MG/5ML PO SOLN
975.0000 mg | Freq: Once | ORAL | Status: AC
  Administered 2013-03-01: 975 mg via ORAL

## 2013-03-01 MED ORDER — OXYCODONE-ACETAMINOPHEN 5-325 MG PO TABS: 1.0000 | ORAL_TABLET | Freq: Four times a day (QID) | ORAL | Status: DC | PRN

## 2013-03-01 MED ORDER — MEPERIDINE HCL 25 MG/ML IJ SOLN: 6.2500 mg | INTRAMUSCULAR | Status: DC | PRN

## 2013-03-01 MED ORDER — LIDOCAINE HCL (CARDIAC) 20 MG/ML IV SOLN
INTRAVENOUS | Status: AC
  Filled 2013-03-01: qty 5

## 2013-03-01 MED ORDER — GLYCOPYRROLATE 0.2 MG/ML IJ SOLN
INTRAMUSCULAR | Status: DC | PRN
  Administered 2013-03-01: 0.4 mg via INTRAVENOUS

## 2013-03-01 MED ORDER — PROPOFOL 10 MG/ML IV BOLUS
INTRAVENOUS | Status: DC | PRN
  Administered 2013-03-01: 30 mg via INTRAVENOUS
  Administered 2013-03-01: 170 mg via INTRAVENOUS

## 2013-03-01 MED ORDER — LIDOCAINE HCL (CARDIAC) 20 MG/ML IV SOLN
INTRAVENOUS | Status: DC | PRN
  Administered 2013-03-01: 70 mg via INTRAVENOUS
  Administered 2013-03-01: 30 mg via INTRAVENOUS

## 2013-03-01 MED ORDER — BUPIVACAINE HCL (PF) 0.25 % IJ SOLN
INTRAMUSCULAR | Status: DC | PRN
  Administered 2013-03-01: 10 mL

## 2013-03-01 MED ORDER — KETOROLAC TROMETHAMINE 30 MG/ML IJ SOLN
INTRAMUSCULAR | Status: DC | PRN
  Administered 2013-03-01: 30 mg via INTRAVENOUS

## 2013-03-01 MED ORDER — DEXAMETHASONE SODIUM PHOSPHATE 10 MG/ML IJ SOLN
INTRAMUSCULAR | Status: DC | PRN
  Administered 2013-03-01: 10 mg via INTRAVENOUS

## 2013-03-01 MED ORDER — SODIUM CHLORIDE 0.9 % IJ SOLN
INTRAMUSCULAR | Status: AC
  Filled 2013-03-01: qty 10

## 2013-03-01 MED ORDER — LACTATED RINGERS IV SOLN
INTRAVENOUS | Status: DC
  Administered 2013-03-01 (×2): via INTRAVENOUS

## 2013-03-01 MED ORDER — KETOROLAC TROMETHAMINE 30 MG/ML IJ SOLN
INTRAMUSCULAR | Status: AC
  Filled 2013-03-01: qty 1

## 2013-03-01 MED ORDER — NEOSTIGMINE METHYLSULFATE 1 MG/ML IJ SOLN
INTRAMUSCULAR | Status: AC
  Filled 2013-03-01: qty 1

## 2013-03-01 MED ORDER — ROCURONIUM BROMIDE 100 MG/10ML IV SOLN
INTRAVENOUS | Status: DC | PRN
  Administered 2013-03-01: 30 mg via INTRAVENOUS

## 2013-03-01 MED ORDER — MIDAZOLAM HCL 2 MG/2ML IJ SOLN
INTRAMUSCULAR | Status: DC | PRN
  Administered 2013-03-01: 2 mg via INTRAVENOUS

## 2013-03-01 MED ORDER — IBUPROFEN 800 MG PO TABS: 800.0000 mg | ORAL_TABLET | Freq: Three times a day (TID) | ORAL | Status: DC | PRN

## 2013-03-01 MED ORDER — FENTANYL CITRATE 0.05 MG/ML IJ SOLN
INTRAMUSCULAR | Status: DC | PRN
  Administered 2013-03-01 (×2): 50 ug via INTRAVENOUS
  Administered 2013-03-01: 100 ug via INTRAVENOUS

## 2013-03-01 MED ORDER — BUPIVACAINE HCL (PF) 0.25 % IJ SOLN
INTRAMUSCULAR | Status: AC
  Filled 2013-03-01: qty 30

## 2013-03-01 MED ORDER — NEOSTIGMINE METHYLSULFATE 1 MG/ML IJ SOLN
INTRAMUSCULAR | Status: DC | PRN
  Administered 2013-03-01: 2 mg via INTRAVENOUS

## 2013-03-01 MED ORDER — ALBUTEROL SULFATE HFA 108 (90 BASE) MCG/ACT IN AERS
INHALATION_SPRAY | RESPIRATORY_TRACT | Status: DC | PRN
  Administered 2013-03-01: 1 via RESPIRATORY_TRACT

## 2013-03-01 MED ORDER — MIDAZOLAM HCL 2 MG/2ML IJ SOLN
INTRAMUSCULAR | Status: AC
  Filled 2013-03-01: qty 2

## 2013-03-01 MED ORDER — ONDANSETRON HCL 4 MG/2ML IJ SOLN
INTRAMUSCULAR | Status: DC | PRN
  Administered 2013-03-01: 4 mg via INTRAVENOUS

## 2013-03-01 MED ORDER — GLYCOPYRROLATE 0.2 MG/ML IJ SOLN
INTRAMUSCULAR | Status: AC
  Filled 2013-03-01: qty 3

## 2013-03-01 MED ORDER — KETOROLAC TROMETHAMINE 30 MG/ML IJ SOLN: 15.0000 mg | Freq: Once | INTRAMUSCULAR | Status: DC | PRN

## 2013-03-01 SURGICAL SUPPLY — 29 items
BAG SPEC RTRVL LRG 6X4 10 (ENDOMECHANICALS)
CABLE HIGH FREQUENCY MONO STRZ (ELECTRODE) IMPLANT
CATH ROBINSON RED A/P 16FR (CATHETERS) IMPLANT
CHLORAPREP W/TINT 26ML (MISCELLANEOUS) ×2 IMPLANT
CLOTH BEACON ORANGE TIMEOUT ST (SAFETY) ×2 IMPLANT
DRSG COVADERM PLUS 2X2 (GAUZE/BANDAGES/DRESSINGS) ×1 IMPLANT
GLOVE BIO SURGEON STRL SZ7 (GLOVE) ×4 IMPLANT
GLOVE BIOGEL PI IND STRL 7.0 (GLOVE) ×1 IMPLANT
GLOVE BIOGEL PI INDICATOR 7.0 (GLOVE) ×3
GOWN PREVENTION PLUS LG XLONG (DISPOSABLE) ×4 IMPLANT
GOWN PREVENTION PLUS XLARGE (GOWN DISPOSABLE) ×2 IMPLANT
MANIPULATOR UTERINE 4.5 ZUMI (MISCELLANEOUS) ×2 IMPLANT
NEEDLE INSUFFLATION 120MM (ENDOMECHANICALS) ×2 IMPLANT
NS IRRIG 1000ML POUR BTL (IV SOLUTION) ×2 IMPLANT
PACK LAPAROSCOPY BASIN (CUSTOM PROCEDURE TRAY) ×2 IMPLANT
POUCH SPECIMEN RETRIEVAL 10MM (ENDOMECHANICALS) IMPLANT
PROTECTOR NERVE ULNAR (MISCELLANEOUS) ×2 IMPLANT
SCALPEL HARMONIC ACE (MISCELLANEOUS) ×1 IMPLANT
SET IRRIG TUBING LAPAROSCOPIC (IRRIGATION / IRRIGATOR) IMPLANT
STRIP CLOSURE SKIN 1/2X4 (GAUZE/BANDAGES/DRESSINGS) ×1 IMPLANT
SUT VIC AB 3-0 X1 27 (SUTURE) ×2 IMPLANT
SUT VICRYL 0 UR6 27IN ABS (SUTURE) ×4 IMPLANT
SUT VICRYL 4-0 PS2 18IN ABS (SUTURE) ×3 IMPLANT
TOWEL OR 17X24 6PK STRL BLUE (TOWEL DISPOSABLE) ×4 IMPLANT
TRAY FOLEY CATH 14FR (SET/KITS/TRAYS/PACK) ×2 IMPLANT
TROCAR BALLN 12MMX100 BLUNT (TROCAR) IMPLANT
TROCAR OPTI TIP 5M 100M (ENDOMECHANICALS) ×3 IMPLANT
TROCAR XCEL DIL TIP R 11M (ENDOMECHANICALS) ×1 IMPLANT
WATER STERILE IRR 1000ML POUR (IV SOLUTION) ×2 IMPLANT

## 2013-03-01 NOTE — Op Note (Signed)
03/01/2013  8:42 AM  PATIENT:  [name]  32 y.o. female  PRE-OPERATIVE DIAGNOSIS:  cpt [phone] - Undesired fertility; history of failed tubal previous  POST-OPERATIVE DIAGNOSIS:  cpt [phone] - Undesired fertility; history of failed tubal previous  PROCEDURE:  Procedure(s): LAPAROSCOPIC BILATERAL SALPINGECTOMY (N/A)  SURGEON:  Surgeon(s) and Role:    * Willodean Rosenthal, MD - Primary    * Reva Bores, MD - Assisting  ANESTHESIA:   general  EBL:  Total I/O In: 1500 [I.V.:1500] Out: 230 [Urine:200; Blood:30]  BLOOD ADMINISTERED:none  DRAINS: none   LOCAL MEDICATIONS USED:  MARCAINE     SPECIMEN:  Source of Specimen:  fallopian tubes  DISPOSITION OF SPECIMEN:  PATHOLOGY  COUNTS:  YES  TOURNIQUET:  * No tourniquets in log *  DICTATION: .Note written in EPIC  PLAN OF CARE: Discharge to home after PACU  PATIENT DISPOSITION:  PACU - hemodynamically stable.   Delay start of Pharmacological VTE agent (>24hrs) due to surgical blood loss or risk of bleeding: not applicable  IINDICATIONS: 32 y.o. [id number] with undesired fertility, desires permanent sterilization. Other reversible forms of contraception were discussed with patient; she declines all other modalities.  Risks of procedure discussed with patient including permanence of method, risk of regret, bleeding, infection, injury to surrounding organs and need for additional procedures including laparotomy.  Patient notified that there is still a remote possibility of an ectopic pregnancy should a pregnancy occur.  Written informed consent was obtained.    FINDINGS:  Normal uterus, fallopian tubes with Filshie clips noted bilateral, and ovaries.  TECHNIQUE:  The patient was taken to the operating room where general anesthesia was obtained without difficulty.  She was then placed in the dorsal lithotomy position and prepared and draped in sterile fashion.  After an adequate timeout was performed, a bivalved speculum was  then placed in the patient's vagina, and the anterior lip of cervix grasped with the single-tooth tenaculum.  The Zumi uterine manipulator was then advanced into the uterus.  The speculum was removed from the vagina.  Attention was then turned to the patient's abdomen where a 5-mm skin incision was made above the umbilicus.  A 5 mm trocar and sleeve were then advanced without difficulty with the laparoscope under direct visualization into the abdomen.  The abdomen was then insufflated with carbon dioxide gas.  Adequate pneumoperitoneum was obtained.  A survey of the patient's pelvis and abdomen revealed the findings above.  Two  5-mm lower quadrant ports  were then placed under direct visualization on the right side of the patients abdomen.  The fallopian tubes were transected from the uterine attachments and the underlying mesosalpinx with the Harmonic scalpel allowing for bilateral salpingectomy.  The lower port on the right side was converted to a 10mm port to remove the fallopian tube with the Filshie clip under direct visualization.  The operative site was surveyed there was a small amount of oozing of blood from the left mesosalpinx which was cauterized with the Harmonic scalpel.  Further evaluation found the entire pelvis to be hemostatic.   No intraoperative injury to other surrounding organs was noted.  The abdomen was desufflated and all instruments were then removed from the patient's abdomen. The fascial incision of the umbilicus was closed with a 0 Vicryl figure of eight stitch. All skin incisions were closed with Dermabond.  The uterine manipulator was removed from the vagina without complications. The patient tolerated the procedure well.  Sponge, lap, and needle counts  were correct times two.  The patient was then taken to the recovery room awake, extubated and in stable condition. 25cc total of 0.25% Marcaine was injected into the port sites.  The patient will be discharged to home as per PACU  criteria.  Routine postoperative instructions given.  She was prescribed Percocet and Ibuprofen.

## 2013-03-01 NOTE — Anesthesia Postprocedure Evaluation (Signed)
  Anesthesia Post-op Note  Patient: [name]  Procedure(s) Performed: Procedure(s): LAPAROSCOPIC BILATERAL SALPINGECTOMY (N/A)  Patient Location: PACU  Anesthesia Type:General  Level of Consciousness: awake, alert  and oriented  Airway and Oxygen Therapy: Patient Spontanous Breathing  Post-op Pain: none  Post-op Assessment: Post-op Vital signs reviewed, Patient's Cardiovascular Status Stable, Respiratory Function Stable, Patent Airway, No signs of Nausea or vomiting and Pain level controlled  Post-op Vital Signs: Reviewed and stable  Complications: No apparent anesthesia complications
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
## 2013-03-01 NOTE — Transfer of Care (Signed)
Immediate Anesthesia Transfer of Care Note  Patient: [name]  Procedure(s) Performed: Procedure(s): LAPAROSCOPIC BILATERAL SALPINGECTOMY (N/A)  Patient Location: PACU  Anesthesia Type:General  Level of Consciousness: awake, alert , oriented and patient cooperative  Airway & Oxygen Therapy: Patient Spontanous Breathing and Patient connected to nasal cannula oxygen  Post-op Assessment: Report given to PACU RN and Post -op Vital signs reviewed and stable  Post vital signs: Reviewed and stable  Complications: No apparent anesthesia complications

## 2013-03-01 NOTE — Brief Op Note (Signed)
03/01/2013  8:42 AM  PATIENT:  [name]  32 y.o. female  PRE-OPERATIVE DIAGNOSIS:  cpt [phone] - Undesired fertility; history of failed tubal previous  POST-OPERATIVE DIAGNOSIS:  cpt [phone] - Undesired fertility; history of failed tubal previous  PROCEDURE:  Procedure(s): LAPAROSCOPIC BILATERAL SALPINGECTOMY (N/A)  SURGEON:  Surgeon(s) and Role:    * Willodean Rosenthal, MD - Primary    * Reva Bores, MD - Assisting  ANESTHESIA:   general  EBL:  Total I/O In: 1500 [I.V.:1500] Out: 230 [Urine:200; Blood:30]  BLOOD ADMINISTERED:none  DRAINS: none   LOCAL MEDICATIONS USED:  MARCAINE     SPECIMEN:  Source of Specimen:  fallopian tubes  DISPOSITION OF SPECIMEN:  PATHOLOGY  COUNTS:  YES  TOURNIQUET:  * No tourniquets in log *  DICTATION: .Note written in EPIC  PLAN OF CARE: Discharge to home after PACU  PATIENT DISPOSITION:  PACU - hemodynamically stable.   Delay start of Pharmacological VTE agent (>24hrs) due to surgical blood loss or risk of bleeding: not applicable

## 2013-03-02 ENCOUNTER — Encounter (HOSPITAL_COMMUNITY): Payer: Self-pay | Admitting: Obstetrics & Gynecology

## 2013-03-05 ENCOUNTER — Inpatient Hospital Stay (HOSPITAL_COMMUNITY): Payer: Medicaid Other

## 2013-03-05 ENCOUNTER — Inpatient Hospital Stay (HOSPITAL_COMMUNITY)
Admission: AD | Admit: 2013-03-05 | Discharge: 2013-03-05 | Disposition: A | Discharge status: Home / Self Care | Payer: Medicaid Other | Source: Ambulatory Visit | Attending: Obstetrics & Gynecology | Admitting: Obstetrics & Gynecology

## 2013-03-05 ENCOUNTER — Encounter (HOSPITAL_COMMUNITY): Payer: Self-pay

## 2013-03-05 DIAGNOSIS — IMO0002 Reserved for concepts with insufficient information to code with codable children: Secondary | ICD-10-CM | POA: Insufficient documentation

## 2013-03-05 DIAGNOSIS — N939 Abnormal uterine and vaginal bleeding, unspecified: Secondary | ICD-10-CM

## 2013-03-05 DIAGNOSIS — N898 Other specified noninflammatory disorders of vagina: Secondary | ICD-10-CM

## 2013-03-05 DIAGNOSIS — Y836 Removal of other organ (partial) (total) as the cause of abnormal reaction of the patient, or of later complication, without mention of misadventure at the time of the procedure: Secondary | ICD-10-CM | POA: Insufficient documentation

## 2013-03-05 DIAGNOSIS — R109 Unspecified abdominal pain: Secondary | ICD-10-CM | POA: Insufficient documentation

## 2013-03-05 LAB — CBC
HCT: 38 % (ref 36.0–46.0)
Hemoglobin: 12.9 g/dL (ref 12.0–15.0)
MCH: 29.5 pg (ref 26.0–34.0)
MCHC: 33.9 g/dL (ref 30.0–36.0)
MCV: 86.8 fL (ref 78.0–100.0)
RDW: 14 % (ref 11.5–15.5)

## 2013-03-05 NOTE — MAU Note (Signed)
Had both tubes removed on 12/18. Large amount of vaginal bleeding with large clots at 0345 this morning accompanied by abdominal cramping.
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
## 2013-03-10 ENCOUNTER — Encounter (HOSPITAL_COMMUNITY): Payer: Self-pay | Admitting: *Deleted

## 2013-03-10 ENCOUNTER — Inpatient Hospital Stay (HOSPITAL_COMMUNITY)
Admission: AD | Admit: 2013-03-10 | Discharge: 2013-03-10 | Disposition: A | Discharge status: Home / Self Care | Payer: Medicaid Other | Source: Ambulatory Visit | Attending: Obstetrics & Gynecology | Admitting: Obstetrics & Gynecology

## 2013-03-10 ENCOUNTER — Inpatient Hospital Stay (HOSPITAL_COMMUNITY): Payer: Medicaid Other

## 2013-03-10 DIAGNOSIS — R05 Cough: Secondary | ICD-10-CM | POA: Insufficient documentation

## 2013-03-10 DIAGNOSIS — R509 Fever, unspecified: Secondary | ICD-10-CM | POA: Insufficient documentation

## 2013-03-10 DIAGNOSIS — F17200 Nicotine dependence, unspecified, uncomplicated: Secondary | ICD-10-CM | POA: Insufficient documentation

## 2013-03-10 DIAGNOSIS — J40 Bronchitis, not specified as acute or chronic: Secondary | ICD-10-CM | POA: Insufficient documentation

## 2013-03-10 DIAGNOSIS — IMO0002 Reserved for concepts with insufficient information to code with codable children: Secondary | ICD-10-CM | POA: Insufficient documentation

## 2013-03-10 DIAGNOSIS — J3489 Other specified disorders of nose and nasal sinuses: Secondary | ICD-10-CM | POA: Insufficient documentation

## 2013-03-10 DIAGNOSIS — R059 Cough, unspecified: Secondary | ICD-10-CM | POA: Insufficient documentation

## 2013-03-10 DIAGNOSIS — R6889 Other general symptoms and signs: Secondary | ICD-10-CM

## 2013-03-10 DIAGNOSIS — J209 Acute bronchitis, unspecified: Secondary | ICD-10-CM

## 2013-03-10 LAB — CBC
HCT: 38.9 % (ref 36.0–46.0)
Hemoglobin: 14 g/dL (ref 12.0–15.0)
MCH: 30.6 pg (ref 26.0–34.0)
MCHC: 36 g/dL (ref 30.0–36.0)
MCV: 85.1 fL (ref 78.0–100.0)
RBC: 4.57 MIL/uL (ref 3.87–5.11)

## 2013-03-10 NOTE — MAU Note (Signed)
Patient presents with complaints of a "busted incision from surgery" (sterilization on the 18th) and a cold X 4 days.
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
## 2013-03-10 NOTE — MAU Provider Note (Signed)
Attestation of Attending Supervision of Advanced Practitioner (CNM/NP): Evaluation and management procedures were performed by the Advanced Practitioner under my supervision and collaboration. I have reviewed the Advanced Practitioner's note and chart, and I agree with the management and plan.  Milynn Quirion H. 6:28 PM

## 2013-03-19 ENCOUNTER — Telehealth: Payer: Self-pay | Admitting: Family Medicine

## 2013-03-19 NOTE — Telephone Encounter (Signed)
Pt called and wants an appointment with Dr. Perley JainMcdiarmid, there is nothing available. She said she was told to call and see him only. I offered another doctor, but she wanted to know if that was okay. Jw

## 2013-03-29 ENCOUNTER — Encounter: Payer: Self-pay | Admitting: Obstetrics & Gynecology

## 2013-03-29 ENCOUNTER — Ambulatory Visit (INDEPENDENT_AMBULATORY_CARE_PROVIDER_SITE_OTHER): Payer: Medicaid Other | Admitting: Obstetrics & Gynecology

## 2013-03-29 VITALS — BP 117/78 | HR 117 | Temp 97.6°F | Ht 66.0 in | Wt 225.0 lb

## 2013-03-29 DIAGNOSIS — Z09 Encounter for follow-up examination after completed treatment for conditions other than malignant neoplasm: Secondary | ICD-10-CM

## 2013-03-29 DIAGNOSIS — Z9889 Other specified postprocedural states: Secondary | ICD-10-CM

## 2013-03-29 NOTE — Patient Instructions (Signed)
You are doing well and your incisions are healing well. Please let us know if there are any issues in the future.

## 2013-03-29 NOTE — Progress Notes (Signed)
Subjective:     Patient ID: [name], female [name]  DOB: 1980/08/21, 33 y.o.   MRN: [id number]  HPI Patient is a 33 yo female who presents for follow-up s/p bilateral salpingectomy on 03/01/13 for a failed bilateral tubal ligation in 2009. She presents for post-op check. She reports no complaints at this time. Notes she had the flu after the surgery and felt as though the umbilical incision popped. She was seen in the MAU with a well healing incision.  Review of Systems see HPI     Objective:   Physical Exam  Constitutional: She appears well-developed and well-nourished. No distress.  Skin:  Umbilical and RLQ incisions well healing without surrounding erythema, drainage, or swelling  BP 117/78  Pulse 117  Temp(Src) 97.6 F (36.4 C)  Ht 5\' 6"  (1.676 m)  Wt 225 lb (102.059 kg)  BMI 36.33 kg/m2  LMP 03/01/2013     Assessment:     S/p bilateral salpingectomy    Plan:     Incisions well healing.  Discussed that this is a permanent surgery.  Patient to follow-up as needed.

## 2013-04-02 ENCOUNTER — Ambulatory Visit (HOSPITAL_COMMUNITY)
Admission: RE | Admit: 2013-04-02 | Discharge: 2013-04-02 | Disposition: A | Discharge status: Home / Self Care | Payer: Medicaid Other | Source: Ambulatory Visit | Attending: Family Medicine | Admitting: Family Medicine

## 2013-04-02 ENCOUNTER — Telehealth: Payer: Self-pay | Admitting: *Deleted

## 2013-04-02 ENCOUNTER — Encounter: Payer: Self-pay | Admitting: Family Medicine

## 2013-04-02 ENCOUNTER — Ambulatory Visit (INDEPENDENT_AMBULATORY_CARE_PROVIDER_SITE_OTHER): Payer: Medicaid Other | Admitting: Family Medicine

## 2013-04-02 VITALS — BP 110/82 | HR 111 | Temp 98.4°F | Wt 224.0 lb

## 2013-04-02 DIAGNOSIS — L2089 Other atopic dermatitis: Secondary | ICD-10-CM

## 2013-04-02 DIAGNOSIS — M25562 Pain in left knee: Secondary | ICD-10-CM

## 2013-04-02 DIAGNOSIS — M25569 Pain in unspecified knee: Secondary | ICD-10-CM

## 2013-04-02 DIAGNOSIS — R32 Unspecified urinary incontinence: Secondary | ICD-10-CM

## 2013-04-02 DIAGNOSIS — L209 Atopic dermatitis, unspecified: Secondary | ICD-10-CM

## 2013-04-02 MED ORDER — OXYCODONE-ACETAMINOPHEN 5-325 MG PO TABS: 1.0000 | ORAL_TABLET | ORAL | Status: DC | PRN

## 2013-04-02 MED ORDER — PREDNISONE 50 MG PO TABS: 50.0000 mg | ORAL_TABLET | Freq: Every day | ORAL | Status: DC

## 2013-04-02 MED ORDER — OXYBUTYNIN CHLORIDE ER 10 MG PO TB24
10.0000 mg | ORAL_TABLET | Freq: Every day | ORAL | Status: DC
Start: 2013-04-02 — End: 2013-04-06

## 2013-04-02 NOTE — Progress Notes (Signed)
Patient ID: [name], female   DOB: 1980/09/04, 33 y.o.   MRN: [id number]    Subjective: HPI: Patient is a 33 y.o. female presenting to clinic today for follow up appointment. Concerns today include knee pain, urinary incontinence and atopic dermatitis  1. Knee pain- Has been told she has OA of knees. It has been getting worse for the last 2 weeks. She has tried Flexeril, ibuprofen, Voltaren gel without relief. No known injury, no new exercises or activities. Has gained more weight recently. She reports swelling of the knee, and entire left leg is bothering her. No obvious redness but states it feels warm. Does interfere with her daily life since it is difficult to walk  2. Rash- On back of neck. Tried multiple creams without any relief. Seen by Dr. McDiarmid. Has history of eczema, but is discouraged that it is not clearing up  3. Urinary incontinence- Persistent dribbling. Started 6-8 months ago, unchanged. She states she had a D&E in August 2014 and noticed it more after that surgery. She feels sudden urge to go to the bathroom. She also wets herself with coughing/sneezing.    History Reviewed: Everyday smoker. Health Maintenance: UTD  ROS: Please see HPI above.  Objective: Office vital signs reviewed. BP 110/82  Pulse 111  Temp(Src) 98.4 F (36.9 C) (Oral)  Wt 224 lb (101.606 kg)  LMP 03/01/2013  Physical Examination:  General: Awake, alert. NAD HEENT: Atraumatic, normocephalic Pulm: CTAB, no wheezes Cardio: RRR, no murmurs appreciated Abdomen:+BS, soft, nontender, nondistended Extremities: Left knee with small effusion, +crepitus. No redness or warmth. FROM despite pain. Walks with limp, but ambulatory. TTP of medial joint line Neuro: Grossly intact  Assessment: 33 y.o. female follow up  Plan: See Problem List and After Visit Summary

## 2013-04-02 NOTE — Telephone Encounter (Signed)
Pt is aware of results. Daesean Lazarz,CMA  

## 2013-04-02 NOTE — Telephone Encounter (Signed)
Message copied by Henri MedalHARTSELL, JAZMIN M on Mon Apr 02, 2013 11:02 AM ------      Message from: [place], [place] M      Created: Mon Apr 02, 2013 10:54 AM       Will you please let Ms. Cutler know that her X-ray was normal and did not show anything broken or out of place. Continue with plan we have already discussed.      Thanks, Continental Airlinesmber M. [place], M.D.       ------

## 2013-04-02 NOTE — Patient Instructions (Signed)
Knee pain - Take prednisone daily for 5 days and pain medicine as needed. Get your X-ray when you can. Skin- Use creams daily. Keep area moisturized Urine- Start one pill nightly and let me know how it is working.  Avery Klingbeil M. Torrence Hammack, M.D.

## 2013-04-03 DIAGNOSIS — G8929 Other chronic pain: Secondary | ICD-10-CM

## 2013-04-03 DIAGNOSIS — M25562 Pain in left knee: Secondary | ICD-10-CM

## 2013-04-03 DIAGNOSIS — R32 Unspecified urinary incontinence: Secondary | ICD-10-CM | POA: Insufficient documentation

## 2013-04-03 HISTORY — DX: Other chronic pain: G89.29

## 2013-04-03 HISTORY — DX: Pain in left knee: M25.562

## 2013-04-03 NOTE — Assessment & Plan Note (Signed)
Suspect OA. Will get Xray since she has not had any imaging. Will try burst of prednisone 50mg  x5 days which may help her atopic dermatitis as well. Given short supply of percocet to help with function right now, but pt informed this will NOT be a chronic medication. F/u as needed and consider steroid injection if needed.

## 2013-04-03 NOTE — Assessment & Plan Note (Signed)
Mixed type based on her explanation. Stress and urge. Will try trial of Oxybutynin and follow up. We have other options for medications (I would say Detrol would be my next choice) or referral to urology. Patient will let me know how she is doing.

## 2013-04-05 ENCOUNTER — Telehealth: Payer: Self-pay | Admitting: *Deleted

## 2013-04-05 NOTE — Telephone Encounter (Signed)
[facility] 10 mg tabs. Formulary and PA form placed in provider box for completion. Clovis PuMartin, Tamika L, RN

## 2013-04-06 ENCOUNTER — Other Ambulatory Visit: Payer: Self-pay | Admitting: Family Medicine

## 2013-04-06 MED ORDER — OXYBUTYNIN CHLORIDE 5 MG PO TABS: 10.0000 mg | ORAL_TABLET | Freq: Every day | ORAL | Status: DC

## 2013-04-06 MED ORDER — OXYBUTYNIN CHLORIDE 5 MG PO TABS: 10.0000 mg | ORAL_TABLET | Freq: Three times a day (TID) | ORAL | Status: DC

## 2013-04-06 NOTE — Telephone Encounter (Signed)
New script sent for Preferred, non-XL version.

## 2013-04-11 ENCOUNTER — Encounter: Payer: Self-pay | Admitting: *Deleted

## 2013-04-11 ENCOUNTER — Other Ambulatory Visit: Payer: Self-pay | Admitting: Family Medicine

## 2013-05-23 ENCOUNTER — Other Ambulatory Visit: Payer: Self-pay | Admitting: Family Medicine

## 2013-06-04 ENCOUNTER — Telehealth: Payer: Self-pay | Admitting: Family Medicine

## 2013-06-04 ENCOUNTER — Ambulatory Visit: Payer: Medicaid Other | Admitting: Family Medicine

## 2013-06-04 ENCOUNTER — Encounter: Payer: Self-pay | Admitting: Family Medicine

## 2013-06-04 ENCOUNTER — Ambulatory Visit: Payer: Medicaid Other

## 2013-06-04 ENCOUNTER — Ambulatory Visit (INDEPENDENT_AMBULATORY_CARE_PROVIDER_SITE_OTHER): Payer: Medicaid Other | Admitting: Family Medicine

## 2013-06-04 VITALS — BP 124/80 | HR 72 | Temp 98.3°F | Wt 225.0 lb

## 2013-06-04 DIAGNOSIS — M545 Low back pain, unspecified: Secondary | ICD-10-CM

## 2013-06-04 DIAGNOSIS — M533 Sacrococcygeal disorders, not elsewhere classified: Secondary | ICD-10-CM

## 2013-06-04 DIAGNOSIS — M25559 Pain in unspecified hip: Secondary | ICD-10-CM

## 2013-06-04 MED ORDER — METHYLPREDNISOLONE ACETATE 40 MG/ML IJ SUSP
40.0000 mg | Freq: Once | INTRAMUSCULAR | Status: AC
  Administered 2013-06-04: 40 mg via INTRA_ARTICULAR

## 2013-06-04 MED ORDER — OXYCODONE-ACETAMINOPHEN 5-325 MG PO TABS: 1.0000 | ORAL_TABLET | ORAL | Status: DC | PRN

## 2013-06-04 NOTE — Telephone Encounter (Signed)
Wants to know if there are any shots available for her back

## 2013-06-04 NOTE — Telephone Encounter (Signed)
[facility] to find out some information from Dr. Perley JainMcdiarmid

## 2013-06-04 NOTE — Telephone Encounter (Signed)
Pt returned call

## 2013-06-04 NOTE — Telephone Encounter (Signed)
Pt came in for office visit. Jazmin Hartsell,CMA

## 2013-06-04 NOTE — Patient Instructions (Signed)
Sacroiliac Joint Dysfunction The sacroiliac joint connects the lower part of the spine (the sacrum) with the bones of the pelvis.  SYMPTOMS  Symptoms may include:  Pain in the:  Lower back.  Buttocks.  Groin.  Thighs and legs.  Difficult sitting, standing, walking, lying, bending or lifting.  TREATMENT  There are a number of types of treatment used for sacroiliac joint dysfunction, including:  Only take over-the-counter or prescription medicines for pain, discomfort, or fever as directed by your caregiver.  Medications to relax muscles.  Rest. Decreasing activity can help cut down on painful muscle spasms and allow the back to heal.  Application of heat or ice to the lower back may improve muscle spasms and soothe pain.  Physical therapy can help teach comfortable positions and exercises to strengthen muscles that support the sacroiliac joint.  Cortisone injections. Injections of steroid medicine into the joint can help decrease swelling and improve pain.  Hyaluronic acid injections. This chemical improves lubrication within the sacroiliac joint, thereby decreasing pain.  Radiofrequency ablation. A special needle is placed into the joint, where it burns away nerves that are carrying pain messages from the joint.  Surgery. Because pain occurs during movement of the joint, screws and plates may be installed in order to limit or prevent joint motion. HOME CARE INSTRUCTIONS   Take all medications exactly as directed.  Follow instructions regarding both rest and physical activity, to avoid worsening the pain.  Do physical therapy exercises exactly as prescribed. SEEK IMMEDIATE MEDICAL CARE IF:  You experience increasingly severe pain.  You develop new symptoms, such as numbness or tingling in your legs or feet.  You lose bladder or bowel control. Document Released: 05/28/2008 Document Revised: 05/24/2011 Document Reviewed: 05/28/2008 [facility] Patient Information 2014  [employer] [place].

## 2013-06-05 ENCOUNTER — Encounter: Payer: Self-pay | Admitting: Family Medicine

## 2013-06-05 DIAGNOSIS — M545 Low back pain, unspecified: Secondary | ICD-10-CM | POA: Insufficient documentation

## 2013-06-05 MED ORDER — TRIAMCINOLONE ACETONIDE 10 MG/ML IJ SUSP: 40.0000 mg | Freq: Once | INTRAMUSCULAR | Status: DC

## 2013-06-05 MED ORDER — LIDOCAINE HCL 1 % IJ SOLN: 1.0000 mL | Freq: Once | INTRAMUSCULAR | Status: DC

## 2013-06-05 NOTE — Assessment & Plan Note (Signed)
Working explanation of acute pain is pain generator from region of left SI joint. After sterile prep and verbal consent: Injection into region of left venous dimple aiming for left SI Joint. Patient tolerated procedure without difficulty. Plan follow up if not improved. Pt may return for nurse administered Toradol IM injection once medication is available.  Reviewed SI joi9nt exercises with patient.

## 2013-06-05 NOTE — Progress Notes (Signed)
   Subjective:    Patient ID: [name], female    DOB: [birth year], 33 y.o.   MRN: [id number]  HPI Onset: 3 days ago Location: left lower back and buttock Quality: aching  Severity: moderate to severe Function: interfering with walking and sleep Duration: 3 days Pattern: constant Course: stable  Radiation: down into back of left leg down into knee Relief: no improvement with APAP, Flexeril, Percocet Precipitant: not certain if related to lifting a propane tank Associated Symptoms: no nauea, no dysuria, no constipation, no saddle numbness, no leg weakness, no difficulty voiding, no fever.  Trauma (Acute or Chronic): none recalled  Prior Diagnostic Testing or Treatments: Lumbar Xray in ~ 2007 showed DDD L5/S1 Relevant PMH/PSH: no Collagen vascular diseases.   Review of Systems see hpi     Objective:   Physical Exam Back Exam: loss of lumbar lordosis Motion: limited trunk flexion SLR seated:  neg  bilat                      XSLR seated: neg  bilat                       Palpable tenderness: tender over dimple of venus on left FABER: (+) on left Sensory change: no loss of touch and proprioception bilateral feet Reflex change: Ankle +1 bilater Knee +1 bi;atera;  Strength at foot Plantar-flexion:  5/ 5    Dorsi-flexion:  5/ 5    Eversion:  5/ 5   Inversion:  5/ 5 Leg strength Quad: 5 / 5   Hamstring:  5/ 5   Hip flexor:  5/ 5   Hip abductors:  5/ 5 Gait Walking: atalgic gait          Abdomen: soft, NT, ND, (+)BS Chest : BCTA      Assessment & Plan:   Left Sacroiliac Injection Procedure Note  Pre-operative Diagnosis: left Sacroiliac Joint Dysfunction  Post-operative Diagnosis: same  Indications: pain relief  Anesthesia: Lidocaine 1% without epinephrine -  Procedure Details   Verbal consent was obtained for the procedure. The joint was prepped with Betadine and ethyl chloride spray. A 25 gauge 1.5 inch needle was inserted into left dimple of venus  4 ml 1%  lidocaine and 1 ml of triamcinolone (KENALOG) 40mg /ml was then injected into the joint through the same needle. The needle was removed and the area cleansed and dressed.  Complications:  None; patient tolerated the procedure well.

## 2013-06-07 ENCOUNTER — Ambulatory Visit (INDEPENDENT_AMBULATORY_CARE_PROVIDER_SITE_OTHER): Payer: Medicaid Other | Admitting: *Deleted

## 2013-06-07 DIAGNOSIS — M545 Low back pain, unspecified: Secondary | ICD-10-CM

## 2013-06-07 MED ORDER — KETOROLAC TROMETHAMINE 60 MG/2ML IM SOLN
60.0000 mg | Freq: Once | INTRAMUSCULAR | Status: AC
  Administered 2013-06-07: 60 mg via INTRAMUSCULAR

## 2013-06-07 NOTE — Progress Notes (Signed)
   Pt in for Toradol injection.  Pt stated pain level is 8/10 lower back.  Per verbal order Dr. Perley JainMcdiarmid, Toradol 60 mg IM x 1 today.  Toradol 60 mg IM given Left Outer Upper Quadrant, site unremarkable.  Pt to call if no pain relief.  Will forward to PCP.  Clovis PuMartin, Tamika L, RN

## 2013-06-29 ENCOUNTER — Ambulatory Visit (INDEPENDENT_AMBULATORY_CARE_PROVIDER_SITE_OTHER): Payer: Medicaid Other | Admitting: *Deleted

## 2013-06-29 DIAGNOSIS — M25519 Pain in unspecified shoulder: Secondary | ICD-10-CM

## 2013-06-29 DIAGNOSIS — M545 Low back pain, unspecified: Secondary | ICD-10-CM

## 2013-06-29 MED ORDER — KETOROLAC TROMETHAMINE 60 MG/2ML IM SOLN
30.0000 mg | Freq: Once | INTRAMUSCULAR | Status: AC
  Administered 2013-06-29: 30 mg via INTRAMUSCULAR

## 2013-06-29 NOTE — Progress Notes (Signed)
   Patient in for Toradol injection. Patient complained of Shoulder and lower back pain. Pain level is 8/10. Toradol 30 mg/1 mL given in the left upper outer quadrant. Will forward to PCP. Clovis Puamika L Dior Dominik, RN

## 2013-09-25 ENCOUNTER — Encounter (HOSPITAL_BASED_OUTPATIENT_CLINIC_OR_DEPARTMENT_OTHER): Payer: Self-pay | Admitting: *Deleted

## 2013-09-28 ENCOUNTER — Encounter (HOSPITAL_BASED_OUTPATIENT_CLINIC_OR_DEPARTMENT_OTHER): Payer: Self-pay | Admitting: *Deleted

## 2013-09-28 NOTE — Progress Notes (Signed)
NPO AFTER MN. ARRIVE AT 0930. NEEDS ISTAT AND EKG. WILL TAKE ZYRTEC , NORVASC, AND PRILOSEC AM DOS W/ SIPS OF WATER.

## 2013-10-02 DIAGNOSIS — H5021 Vertical strabismus, right eye: Secondary | ICD-10-CM

## 2013-10-02 HISTORY — DX: Vertical strabismus, right eye: H50.21
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
## 2013-10-03 ENCOUNTER — Ambulatory Visit (HOSPITAL_BASED_OUTPATIENT_CLINIC_OR_DEPARTMENT_OTHER)
Admission: RE | Admit: 2013-10-03 | Discharge: 2013-10-03 | Disposition: A | Discharge status: Home / Self Care | Payer: Medicaid Other | Source: Ambulatory Visit | Attending: Ophthalmology | Admitting: Ophthalmology

## 2013-10-03 ENCOUNTER — Encounter (HOSPITAL_BASED_OUTPATIENT_CLINIC_OR_DEPARTMENT_OTHER): Payer: Medicaid Other | Admitting: Anesthesiology

## 2013-10-03 ENCOUNTER — Encounter (HOSPITAL_BASED_OUTPATIENT_CLINIC_OR_DEPARTMENT_OTHER)
Admission: RE | Disposition: A | Discharge status: Home / Self Care | Payer: Self-pay | Source: Ambulatory Visit | Attending: Ophthalmology

## 2013-10-03 ENCOUNTER — Ambulatory Visit (HOSPITAL_BASED_OUTPATIENT_CLINIC_OR_DEPARTMENT_OTHER): Payer: Medicaid Other | Admitting: Anesthesiology

## 2013-10-03 ENCOUNTER — Encounter (HOSPITAL_BASED_OUTPATIENT_CLINIC_OR_DEPARTMENT_OTHER): Payer: Self-pay | Admitting: *Deleted

## 2013-10-03 DIAGNOSIS — K279 Peptic ulcer, site unspecified, unspecified as acute or chronic, without hemorrhage or perforation: Secondary | ICD-10-CM | POA: Insufficient documentation

## 2013-10-03 DIAGNOSIS — I10 Essential (primary) hypertension: Secondary | ICD-10-CM | POA: Diagnosis not present

## 2013-10-03 DIAGNOSIS — IMO0002 Reserved for concepts with insufficient information to code with codable children: Secondary | ICD-10-CM | POA: Diagnosis present

## 2013-10-03 DIAGNOSIS — J45909 Unspecified asthma, uncomplicated: Secondary | ICD-10-CM | POA: Insufficient documentation

## 2013-10-03 DIAGNOSIS — Z8659 Personal history of other mental and behavioral disorders: Secondary | ICD-10-CM

## 2013-10-03 DIAGNOSIS — F17200 Nicotine dependence, unspecified, uncomplicated: Secondary | ICD-10-CM

## 2013-10-03 DIAGNOSIS — N870 Mild cervical dysplasia: Secondary | ICD-10-CM

## 2013-10-03 DIAGNOSIS — K219 Gastro-esophageal reflux disease without esophagitis: Secondary | ICD-10-CM | POA: Insufficient documentation

## 2013-10-03 DIAGNOSIS — Z79899 Other long term (current) drug therapy: Secondary | ICD-10-CM | POA: Diagnosis not present

## 2013-10-03 DIAGNOSIS — F259 Schizoaffective disorder, unspecified: Secondary | ICD-10-CM | POA: Insufficient documentation

## 2013-10-03 DIAGNOSIS — F1010 Alcohol abuse, uncomplicated: Secondary | ICD-10-CM

## 2013-10-03 DIAGNOSIS — F3160 Bipolar disorder, current episode mixed, unspecified: Secondary | ICD-10-CM

## 2013-10-03 DIAGNOSIS — Z8742 Personal history of other diseases of the female genital tract: Secondary | ICD-10-CM

## 2013-10-03 DIAGNOSIS — F319 Bipolar disorder, unspecified: Secondary | ICD-10-CM | POA: Diagnosis not present

## 2013-10-03 DIAGNOSIS — N920 Excessive and frequent menstruation with regular cycle: Secondary | ICD-10-CM

## 2013-10-03 DIAGNOSIS — H5021 Vertical strabismus, right eye: Secondary | ICD-10-CM

## 2013-10-03 HISTORY — DX: Personal history of other diseases of the nervous system and sense organs: Z86.69

## 2013-10-03 HISTORY — DX: Mild intermittent asthma, uncomplicated: J45.20

## 2013-10-03 HISTORY — DX: Presence of spectacles and contact lenses: Z97.3

## 2013-10-03 HISTORY — DX: Gastric ulcer, unspecified as acute or chronic, without hemorrhage or perforation: K25.9

## 2013-10-03 HISTORY — PX: MUSCLE RECESSION AND RESECTION: SHX5209

## 2013-10-03 LAB — POCT I-STAT 4, (NA,K, GLUC, HGB,HCT)
Glucose, Bld: 88 mg/dL (ref 70–99)
HEMATOCRIT: 47 % — AB (ref 36.0–46.0)
Hemoglobin: 16 g/dL — ABNORMAL HIGH (ref 12.0–15.0)
Potassium: 3.6 mEq/L — ABNORMAL LOW (ref 3.7–5.3)
Sodium: 141 mEq/L (ref 137–147)

## 2013-10-03 SURGERY — MUSCLE RECESSION/RESECTION
Anesthesia: General | Site: Eye | Laterality: Right

## 2013-10-03 MED ORDER — PHENYLEPHRINE HCL 2.5 % OP SOLN
OPHTHALMIC | Status: DC | PRN
  Administered 2013-10-03: 3 [drp] via OPHTHALMIC

## 2013-10-03 MED ORDER — MIDAZOLAM HCL 5 MG/5ML IJ SOLN
INTRAMUSCULAR | Status: DC | PRN
  Administered 2013-10-03: 2 mg via INTRAVENOUS

## 2013-10-03 MED ORDER — ONDANSETRON HCL 4 MG/2ML IJ SOLN
INTRAMUSCULAR | Status: DC | PRN
  Administered 2013-10-03: 4 mg via INTRAVENOUS

## 2013-10-03 MED ORDER — FENTANYL CITRATE 0.05 MG/ML IJ SOLN
INTRAMUSCULAR | Status: DC | PRN
  Administered 2013-10-03 (×4): 50 ug via INTRAVENOUS

## 2013-10-03 MED ORDER — FENTANYL CITRATE 0.05 MG/ML IJ SOLN
25.0000 ug | INTRAMUSCULAR | Status: DC | PRN
  Administered 2013-10-03 (×2): 50 ug via INTRAVENOUS
  Filled 2013-10-03: qty 1

## 2013-10-03 MED ORDER — BSS IO SOLN
INTRAOCULAR | Status: DC | PRN
  Administered 2013-10-03: 30 mL via INTRAOCULAR

## 2013-10-03 MED ORDER — PROMETHAZINE HCL 25 MG/ML IJ SOLN
6.2500 mg | INTRAMUSCULAR | Status: DC | PRN
  Administered 2013-10-03: 12.5 mg via INTRAVENOUS
  Filled 2013-10-03: qty 1

## 2013-10-03 MED ORDER — TOBRAMYCIN-DEXAMETHASONE 0.3-0.1 % OP OINT: 1.0000 "application " | TOPICAL_OINTMENT | Freq: Two times a day (BID) | OPHTHALMIC | Status: DC

## 2013-10-03 MED ORDER — PROPOFOL 10 MG/ML IV BOLUS
INTRAVENOUS | Status: DC | PRN
  Administered 2013-10-03: 200 mg via INTRAVENOUS

## 2013-10-03 MED ORDER — LACTATED RINGERS IV SOLN
INTRAVENOUS | Status: DC
  Administered 2013-10-03 (×2): via INTRAVENOUS
  Filled 2013-10-03: qty 1000

## 2013-10-03 MED ORDER — ACETAMINOPHEN 10 MG/ML IV SOLN
INTRAVENOUS | Status: DC | PRN
  Administered 2013-10-03: 1000 mg via INTRAVENOUS

## 2013-10-03 MED ORDER — LIDOCAINE HCL (CARDIAC) 20 MG/ML IV SOLN
INTRAVENOUS | Status: DC | PRN
  Administered 2013-10-03: 100 mg via INTRAVENOUS

## 2013-10-03 MED ORDER — ACETAMINOPHEN-CODEINE #2 300-15 MG PO TABS: 1.0000 | ORAL_TABLET | ORAL | Status: DC | PRN

## 2013-10-03 MED ORDER — PROMETHAZINE HCL 25 MG/ML IJ SOLN
INTRAMUSCULAR | Status: AC
  Filled 2013-10-03: qty 1

## 2013-10-03 MED ORDER — DEXAMETHASONE SODIUM PHOSPHATE 4 MG/ML IJ SOLN
INTRAMUSCULAR | Status: DC | PRN
  Administered 2013-10-03: 10 mg via INTRAVENOUS

## 2013-10-03 MED ORDER — STERILE WATER FOR IRRIGATION IR SOLN
Status: DC | PRN
  Administered 2013-10-03: 500 mL

## 2013-10-03 MED ORDER — MIDAZOLAM HCL 2 MG/2ML IJ SOLN
INTRAMUSCULAR | Status: AC
  Filled 2013-10-03: qty 2

## 2013-10-03 MED ORDER — FENTANYL CITRATE 0.05 MG/ML IJ SOLN
INTRAMUSCULAR | Status: AC
  Filled 2013-10-03: qty 4

## 2013-10-03 MED ORDER — KETOROLAC TROMETHAMINE 30 MG/ML IJ SOLN
INTRAMUSCULAR | Status: DC | PRN
  Administered 2013-10-03 (×2): 15 mg via INTRAVENOUS

## 2013-10-03 MED ORDER — FENTANYL CITRATE 0.05 MG/ML IJ SOLN
INTRAMUSCULAR | Status: AC
  Filled 2013-10-03: qty 2

## 2013-10-03 MED ORDER — LACTATED RINGERS IV SOLN
INTRAVENOUS | Status: DC
  Filled 2013-10-03: qty 1000

## 2013-10-03 MED ORDER — TOBRAMYCIN-DEXAMETHASONE 0.3-0.1 % OP OINT
TOPICAL_OINTMENT | OPHTHALMIC | Status: DC | PRN
  Administered 2013-10-03: 1 via OPHTHALMIC

## 2013-10-03 SURGICAL SUPPLY — 27 items
APL SRG 3 HI ABS STRL LF PLS (MISCELLANEOUS) ×2
APPLICATOR DR MATTHEWS STRL (MISCELLANEOUS) ×3 IMPLANT
BANDAGE EYE OVAL (MISCELLANEOUS) IMPLANT
CAUTERY EYE LOW TEMP 1300F FIN (OPHTHALMIC RELATED) ×2 IMPLANT
CLOTH BEACON ORANGE TIMEOUT ST (SAFETY) ×2 IMPLANT
CORDS BIPOLAR (ELECTRODE) IMPLANT
COVER MAYO STAND STRL (DRAPES) ×2 IMPLANT
COVER TABLE BACK 60X90 (DRAPES) ×2 IMPLANT
DRAPE LG THREE QUARTER DISP (DRAPES) ×2 IMPLANT
DRAPE SURG 17X23 STRL (DRAPES) ×6 IMPLANT
GLOVE BIO SURGEON STRL SZ 6 (GLOVE) ×1 IMPLANT
GLOVE INDICATOR 6.5 STRL GRN (GLOVE) ×1 IMPLANT
GLOVE SURG SIGNA 7.5 PF LTX (GLOVE) ×2 IMPLANT
GOWN PREVENTION PLUS LG XLONG (DISPOSABLE) ×1 IMPLANT
GOWN STRL REUS W/ TWL LRG LVL3 (GOWN DISPOSABLE) IMPLANT
GOWN STRL REUS W/TWL LRG LVL3 (GOWN DISPOSABLE) ×4
NS IRRIG 500ML POUR BTL (IV SOLUTION) ×2 IMPLANT
PACK BASIN DAY SURGERY FS (CUSTOM PROCEDURE TRAY) ×2 IMPLANT
SPEAR EYE SURGICAL ST (MISCELLANEOUS) IMPLANT
STRIP CLOSURE SKIN 1/2X4 (GAUZE/BANDAGES/DRESSINGS) ×2 IMPLANT
SUT MERSILENE 6 0 S14 DA (SUTURE) IMPLANT
SUT VICRYL 6 0 S 29 12 (SUTURE) ×2 IMPLANT
SUT VICRYL 7 0 TG140 8 (SUTURE) IMPLANT
SUT VICRYL 8 0 TG140 8 (SUTURE) IMPLANT
TOWEL OR 17X24 6PK STRL BLUE (TOWEL DISPOSABLE) ×3 IMPLANT
TRAY DSU PREP LF (CUSTOM PROCEDURE TRAY) ×2 IMPLANT
WATER STERILE IRR 500ML POUR (IV SOLUTION) ×1 IMPLANT

## 2013-10-03 NOTE — Interval H&P Note (Signed)
History and Physical Interval Note:  10/03/2013 10:12 AM  [name]  has presented today for surgery, with the diagnosis of hypertropia right eye  The various methods of treatment have been discussed with the patient and family. After consideration of risks, benefits and other options for treatment, the patient has consented to  Procedure(s): SUPERIOR OBILQUE RECESSION OF THE RIGHT EYE (Right) as a surgical intervention .  The patient's history has been reviewed, patient examined, no change in status, stable for surgery.  I have reviewed the patient's chart and labs.  Questions were answered to the patient's satisfaction.     Ustin Cruickshank A

## 2013-10-03 NOTE — Anesthesia Procedure Notes (Signed)
Procedure Name: LMA Insertion Date/Time: 10/03/2013 10:31 AM Performed by: Norva PavlovALLAWAY, Jaquia Benedicto G Pre-anesthesia Checklist: Patient identified, Emergency Drugs available, Suction available and Patient being monitored Patient Re-evaluated:Patient Re-evaluated prior to inductionOxygen Delivery Method: Circle System Utilized Preoxygenation: Pre-oxygenation with 100% oxygen Intubation Type: IV induction Ventilation: Mask ventilation without difficulty LMA: LMA flexible inserted LMA Size: 4.0 Number of attempts: 1 Airway Equipment and Method: bite block Placement Confirmation: positive ETCO2 Tube secured with: Tape Dental Injury: Teeth and Oropharynx as per pre-operative assessment

## 2013-10-03 NOTE — Transfer of Care (Signed)
Immediate Anesthesia Transfer of Care Note  Patient: [name]  Procedure(s) Performed: Procedure(s) (LRB): SUPERIOR OBILQUE RECESSION OF THE RIGHT EYE (Right)  Patient Location: PACU  Anesthesia Type: General  Level of Consciousness: awake, alert  and oriented  Airway & Oxygen Therapy: Patient Spontanous Breathing and Patient connected to face mask oxygen  Post-op Assessment: Report given to PACU RN and Post -op Vital signs reviewed and stable  Post vital signs: Reviewed and stable  Complications: No apparent anesthesia complications

## 2013-10-03 NOTE — Discharge Instructions (Signed)
°  Post Anesthesia Home Care Instructions  Activity: Get plenty of rest for the remainder of the day. A responsible adult should stay with you for 24 hours following the procedure.  For the next 24 hours, DO NOT: -Drive a car -Advertising copywriterperate machinery -Drink alcoholic beverages -Take any medication unless instructed by your physician -Make any legal decisions or sign important papers.  Meals: Start with liquid foods such as gelatin or soup. Progress to regular foods as tolerated. Avoid greasy, spicy, heavy foods. If nausea and/or vomiting occur, drink only clear liquids until the nausea and/or vomiting subsides. Call your physician if vomiting continues.  Special Instructions/Symptoms: Your throat may feel dry or sore from the anesthesia or the breathing tube placed in your throat during surgery. If this causes discomfort, gargle with warm salt water. The discomfort should disappear within 24 hours. Call your surgeon if you experience:    1.  Fever over 101.0. 2.  Inability to urinate. 3.  Nausea and/or vomiting. 4.  Extreme swelling or bruising at the surgical site. 5.  Continued bleeding from the incision. 6.  Increased pain, redness or drainage from the incision. 7.  Problems related to your pain medication. 8. Any change in color, movement and/or sensation 9. Any problems and/or concerns 10. Any change in vision

## 2013-10-03 NOTE — Anesthesia Preprocedure Evaluation (Addendum)
Anesthesia Evaluation   Patient awake    Reviewed: Allergy & Precautions, H&P , NPO status , Patient's Chart, lab work & pertinent test results  Airway Mallampati: I TM Distance: >3 FB Neck ROM: Full    Dental  (+) Teeth Intact, Dental Advisory Given   Pulmonary asthma , Current Smoker,  breath sounds clear to auscultation  Pulmonary exam normal       Cardiovascular hypertension, Pt. on medications Rhythm:Regular Rate:Normal     Neuro/Psych Seizures - (Seizures as child), Well Controlled,  PSYCHIATRIC DISORDERS (Schizoaffective disorder) Depression Bipolar Disorder    GI/Hepatic PUD, GERD-  Medicated and Controlled,(+)     substance abuse  alcohol use,   Endo/Other    Renal/GU      Musculoskeletal   Abdominal   Peds  Hematology   Anesthesia Other Findings   Reproductive/Obstetrics                          Anesthesia Physical Anesthesia Plan  ASA: III  Anesthesia Plan: General   Post-op Pain Management:    Induction: Intravenous  Airway Management Planned: LMA  Additional Equipment:   Intra-op Plan:   Post-operative Plan: Extubation in OR  Informed Consent: I have reviewed the patients History and Physical, chart, labs and discussed the procedure including the risks, benefits and alternatives for the proposed anesthesia with the patient or authorized representative who has indicated his/her understanding and acceptance.   Dental advisory given  Plan Discussed with: CRNA  Anesthesia Plan Comments:         Anesthesia Quick Evaluation

## 2013-10-04 ENCOUNTER — Encounter (HOSPITAL_BASED_OUTPATIENT_CLINIC_OR_DEPARTMENT_OTHER): Payer: Self-pay | Admitting: Ophthalmology

## 2013-10-04 NOTE — Anesthesia Postprocedure Evaluation (Signed)
Anesthesia Post Note  Patient: [name]  Procedure(s) Performed: Procedure(s) (LRB): SUPERIOR OBILQUE RECESSION OF THE RIGHT EYE (Right)  Anesthesia type: General  Patient location: PACU  Post pain: Pain level controlled  Post assessment: Post-op Vital signs reviewed  Last Vitals:  Filed Vitals:   10/03/13 1322  BP: 122/86  Pulse: 76  Temp: 36.7 C  Resp: 12    Post vital signs: Reviewed  Level of consciousness: sedated  Complications: No apparent anesthesia complications

## 2013-10-11 ENCOUNTER — Ambulatory Visit: Payer: Medicaid Other

## 2013-10-16 ENCOUNTER — Ambulatory Visit (INDEPENDENT_AMBULATORY_CARE_PROVIDER_SITE_OTHER): Payer: Medicaid Other | Admitting: *Deleted

## 2013-10-16 DIAGNOSIS — M25562 Pain in left knee: Secondary | ICD-10-CM

## 2013-10-16 DIAGNOSIS — M25569 Pain in unspecified knee: Secondary | ICD-10-CM

## 2013-10-16 DIAGNOSIS — M25511 Pain in right shoulder: Secondary | ICD-10-CM

## 2013-10-16 DIAGNOSIS — M25519 Pain in unspecified shoulder: Secondary | ICD-10-CM

## 2013-10-16 MED ORDER — KETOROLAC TROMETHAMINE 60 MG/2ML IM SOLN
60.0000 mg | Freq: Once | INTRAMUSCULAR | Status: AC
  Administered 2013-10-16: 60 mg via INTRAMUSCULAR

## 2013-10-16 NOTE — Progress Notes (Signed)
   Pt in nurse clinic for pain injection.  Pt stated her back, left knee and right shoulder hurts; pain level 8.  Toradol 60 mg/ 2 ML given left upper outer quadrant per verbal order Dr. Perley JainMcdiarmid.  Pt advised to schedule an appt for follow up with PCP for pain.  Clovis PuMartin, Connie Hilgert L, RN

## 2013-10-28 NOTE — Brief Op Note (Signed)
10/03/2013  5:09 PM  PATIENT:  [name]  [age] y.o. female  PRE-OPERATIVE DIAGNOSIS:  hypertropia right eye  POST-OPERATIVE DIAGNOSIS:  hypertropia right eye  PROCEDURE:  Procedure(s): SUPERIOR OBILQUE RECESSION OF THE RIGHT EYE (Right)  SURGEON:  Surgeon(s) and Role:    * Corinda GublerMichael A Monzerat Handler, MD - Primary  PHYSICIAN ASSISTANT:   ASSISTANTS: none   ANESTHESIA:   general  EBL:     BLOOD ADMINISTERED:none  DRAINS: none   LOCAL MEDICATIONS USED:  NONE  SPECIMEN:  No Specimen  DISPOSITION OF SPECIMEN:  N/A  COUNTS:  YES  TOURNIQUET:  * No tourniquets in log *  DICTATION: .Other Dictation: Dictation Number [id number]  PLAN OF CARE: Discharge to home after PACU  PATIENT DISPOSITION:  PACU - hemodynamically stable.   Delay start of Pharmacological VTE agent (>24hrs) due to surgical blood loss or risk of bleeding: yes

## 2013-10-29 NOTE — Op Note (Signed)
NAME:  Shirley ShutterBOLLER, SHEDONNA                  ACCOUNT NO.:  MEDICAL RECORD NO.:  [id number]  LOCATION:                                 FACILITY:  PHYSICIAN:  Casimiro NeedleMichael A. Karleen HampshireSpencer, M.D.    DATE OF BIRTH:  DATE OF PROCEDURE:  10/28/2013 DATE OF DISCHARGE:                              OPERATIVE REPORT   PREOPERATIVE DIAGNOSIS:  Right hypotropia.  POSTOPERATIVE DIAGNOSIS:  Status post right superior oblique recession.  PROCEDURE:  Right superior oblique recession.  SURGEON:  Tyrone AppleMichael A. Karleen HampshireSpencer, M.D.  ANESTHESIA:  General with laryngeal mask airway.  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  This procedure is indicated to restore  single binocular vision,and  restore alignment of the visual axis. The Risks and benefits of the procedure were explained to the patient.  Prior to procedure, informed consent was obtained.  DESCRIPTION OF TECHNIQUE:  The patient was taken into the operating room, placed in the supine position.  The entire face was prepped and draped in usual sterile fashion.  After induction by general anesthesia and establishment of laryngeal mask airway, my attention was first directed to the right eye.  A lid speculum was placed.  Forced duction tests were performed and found to be negative.  The globe was then held in the superior temporal quadrant.   The globe was then elevated and adducted.An incision was made through the conjunctiva taken down to the superior temporal fornix and the right superior rectus tendon was then isolated on a Stevens hook subsequently on a Green hook.  This was used to hold the globe in a depressed and adducted position.  Next, the superior oblique fibers were isolated coursing inferior to the superior rectus tendon.  Under direct visualization, they were sequestered on 2 Stevens hooks, and moved into the dissecting field and the superior oblique  tendon was then carefully dissected free from its overlying muscle fascia and intermuscular septum. Next, the tendon was then imbricated on 6-0 Vicryl suture at its insertion insertion straddling the vortex veins.It was then dissected free from its insertion point and recessed 5mm in a  direction along its axis from the trochlear notch.  It was reattached the globe at this point using the pre-placed sutures, the sutures tied securely and the conjunctiva was repositioned.  At the conclusion of the procedure, the conjunctiva was repositioned and TobraDex ointment was instilled into the fornices of the right eye.  There were no apparent complications.     Casimiro NeedleMichael A. Karleen HampshireSpencer, M.D.     MAS/MEDQ  D:  10/28/2013  T:  10/28/2013  Job:  [id number]

## 2013-11-05 ENCOUNTER — Other Ambulatory Visit: Payer: Self-pay | Admitting: Family Medicine

## 2013-11-06 ENCOUNTER — Encounter: Payer: Self-pay | Admitting: *Deleted

## 2013-11-06 DIAGNOSIS — L209 Atopic dermatitis, unspecified: Secondary | ICD-10-CM

## 2013-11-06 NOTE — Progress Notes (Signed)
Prior Authorization received from [facility] pharmacy for Fluocinolone 0.01% cream. Formulary and PA form placed in provider box for completion. Clovis Pu, RN

## 2013-11-07 MED ORDER — HYDROCORTISONE VALERATE 0.2 % EX OINT: 1.0000 "application " | TOPICAL_OINTMENT | Freq: Every day | CUTANEOUS | Status: DC

## 2013-11-07 NOTE — Progress Notes (Signed)
Pt informed of Rx sent in for Westcort 0.2%.  Clovis Pu, RN

## 2013-11-07 NOTE — Progress Notes (Signed)
Patient ID: [name], female   DOB: 1980/11/04, 33 y.o.   MRN: [id number] Please let patient know that Dr Jeriah Skufca prescribed an equivalent topical steroid to Fluocinolone called Westcort 0.2% which is on the [facility] Medicaid Preferred Medication List. The new prescription was sent to her pharmacy, [facility] on [address].

## 2013-11-22 ENCOUNTER — Ambulatory Visit (INDEPENDENT_AMBULATORY_CARE_PROVIDER_SITE_OTHER): Payer: Medicaid Other | Admitting: Family Medicine

## 2013-11-22 ENCOUNTER — Encounter: Payer: Self-pay | Admitting: Family Medicine

## 2013-11-22 VITALS — BP 119/88 | HR 72 | Temp 98.2°F | Wt 218.0 lb

## 2013-11-22 DIAGNOSIS — F3160 Bipolar disorder, current episode mixed, unspecified: Secondary | ICD-10-CM

## 2013-11-22 DIAGNOSIS — M25562 Pain in left knee: Secondary | ICD-10-CM

## 2013-11-22 DIAGNOSIS — G8929 Other chronic pain: Secondary | ICD-10-CM | POA: Diagnosis not present

## 2013-11-22 DIAGNOSIS — M545 Low back pain, unspecified: Secondary | ICD-10-CM

## 2013-11-22 DIAGNOSIS — L2089 Other atopic dermatitis: Secondary | ICD-10-CM

## 2013-11-22 DIAGNOSIS — M25569 Pain in unspecified knee: Secondary | ICD-10-CM | POA: Diagnosis not present

## 2013-11-22 DIAGNOSIS — M25519 Pain in unspecified shoulder: Secondary | ICD-10-CM

## 2013-11-22 DIAGNOSIS — M25561 Pain in right knee: Secondary | ICD-10-CM

## 2013-11-22 DIAGNOSIS — I10 Essential (primary) hypertension: Secondary | ICD-10-CM

## 2013-11-22 DIAGNOSIS — F250 Schizoaffective disorder, bipolar type: Secondary | ICD-10-CM

## 2013-11-22 DIAGNOSIS — F259 Schizoaffective disorder, unspecified: Secondary | ICD-10-CM

## 2013-11-22 DIAGNOSIS — F17200 Nicotine dependence, unspecified, uncomplicated: Secondary | ICD-10-CM

## 2013-11-22 DIAGNOSIS — Z23 Encounter for immunization: Secondary | ICD-10-CM

## 2013-11-22 DIAGNOSIS — L209 Atopic dermatitis, unspecified: Secondary | ICD-10-CM

## 2013-11-22 DIAGNOSIS — M25512 Pain in left shoulder: Secondary | ICD-10-CM

## 2013-11-22 MED ORDER — DESIPRAMINE HCL 25 MG PO TABS: 25.0000 mg | ORAL_TABLET | Freq: Every day | ORAL | Status: DC

## 2013-11-22 MED ORDER — KETOROLAC TROMETHAMINE 60 MG/2ML IM SOLN
60.0000 mg | Freq: Once | INTRAMUSCULAR | Status: AC
  Administered 2013-11-22: 60 mg via INTRAMUSCULAR

## 2013-11-22 NOTE — Patient Instructions (Addendum)
Try [employer] (?[employer]) foundation cosmetic to cover the darkened skin areas.  Toradol injection today.    Start a long-acting form of your Flexeril medication for muscle spasm pain called, Desipramine 25 mg at bedtime.  Return to clinic in 3 months for recheck.

## 2013-11-23 ENCOUNTER — Encounter: Payer: Self-pay | Admitting: Family Medicine

## 2013-11-23 DIAGNOSIS — M25512 Pain in left shoulder: Secondary | ICD-10-CM

## 2013-11-23 HISTORY — DX: Pain in left shoulder: M25.512

## 2013-11-23 NOTE — Assessment & Plan Note (Signed)
Not interested in cessation.  Assistance offered.  Will continue to address.

## 2013-11-23 NOTE — Assessment & Plan Note (Addendum)
Mild crepitus anteriorly with knee flex/ext.  No inflammatory changes. APAP as needed recommended. NSAIDs if APAP inadequate.   Given now four major joints (Bilateral shoulders and bilateral knees) pain with no recent Rheumatic Lab testing, will get ACCP, ANA, ESR, CBC next visit.   Given patient report of transient improvments in pain with Cyclobenzaprine as needed use, will try trial of a TCA, Desipramine. Start at 25 mg at bedtime with titration up towards 75-100 mg to treat for chronic pain/fibromyalgia. Will need to watch for precipitation of mania in patient with diagnosis of Bipolar disorder. Pt on mood stabilizer, Invega.

## 2013-11-23 NOTE — Assessment & Plan Note (Signed)
Stable pain. APAP as needed.

## 2013-11-23 NOTE — Assessment & Plan Note (Addendum)
Relatively quiescent except for active nummular patch on right hand dorsum. Encouraged patient to fill Westcort cream Rx and start applying it to right hand patch.  Enocouraged frequent use of emollients to hands and nape of neck  Pt may use Covermark cosmetic foundation to post-inflammatory hyperpigmentation marks on nape of neck.

## 2013-11-23 NOTE — Assessment & Plan Note (Addendum)
Adequate blood pressure control.  No evidence of new end organ damage.  Tolerating medication without significant adverse effects.  Plan to continue current blood pressure regiment of Amlodipine 5 mg daily.

## 2013-11-23 NOTE — Assessment & Plan Note (Signed)
Stable. Continue INvega IM mangement per [employer].

## 2013-11-23 NOTE — Assessment & Plan Note (Signed)
Stable. Continues on Invega intramuscular per [employer]

## 2013-11-23 NOTE — Progress Notes (Signed)
Subjective:    Patient ID: [name], female    DOB: [birth year], 33 y.o.   MRN: [id number]  Shoulder Pain  Pertinent negatives include no fever.  Back Pain Pertinent negatives include no fever.  Knee Pain    Problem List Items Addressed This Visit     Medium   TOBACCO DEPENDENCE (Chronic) -Longstanding since adolescence - Not interested in quitting currently   Schizoaffective disorder - Diagnosed several years ago at [facility] where patient continues to receive pharmacologic management.   - Denies depressed mood, ahedonia, delusions and hallucinations. - Taking Invega IM via [facility].    HYPERTENSION, BENIGN ESSENTIAL (Chronic) Disease Monitoring  Blood pressure range: not checking at home  Chest pain: no   Dyspnea: no   Claudication: no   Medication compliance: yes. Taking amlodipine 5 mg daily Medication Side Effects  Lightheadedness: no   Urinary frequency: no  Edema: no    Preventitive Healthcare:  Exercise: no   Diet Pattern: trying to lose weight  Salt Restriction: yes     BIPOLAR AFFECTIVE DISORDER, MIXED (Chronic) - See Schizoaffective Problem above.      Low   Atopic dermatitis (Chronic) - Chonic problem for years - Has patch of involved skin on dorsum left hand. - Has not gotten prescribed Westcort cream because could not afford $3 copay.  Planning of getting it this week.  - Bothered by post-inflammatory hyperpigmentation on nape of neck and inter-scapula skin.     Unprioritized   Low back pain - Longstanding problem - Currently with little pain - Able to perform ADL and iADLs. - Recently turned down again for Disability application.    Relevant Medications      ketorolac (TORADOL) injection 60 mg (Completed)   Left shoulder pain - New problem for patient Onset: month ago Location: back of left shoulder Quality: aching with intermittent sharp pains Severity: 6 out of 10 Function: pain with raising left arm over head  interferring with grooming Duration: month Pattern: intermittent Course: stable  Radiation: no Relief: Percocet Precipitant: none recalled Treatment: Taking Flexeril 5 mg three times day helps some for this pain as well as other pains in knees and shoulder.  Associated Symptoms: pain in right shoulder that has not had a medical explanation despite a arthroscopic evaluation.  Also complaining of pain in left knee.  Previously complaining of pain in right knee. Trauma (Acute or Chronic): none recalled Prior Diagnostic Testing or Treatments: Left knee 4V Xray 03/2013 for left knee pain was norm. Relevant PMH/PSH: MRI of right should in 02/04/12 should os acromiale and subacromial bursitis with normal rotator cuff.     Chronic pain of right knee (Chronic)   Chronic pain of left knee     Normal 4 View XRay of left knee (03/2013) for knee pain.     Relevant Medications      desipramine (NORPRAMIN) tablet      ketorolac (TORADOL) injection 60 mg (Completed)    Other Visit Diagnoses   Need for prophylactic vaccination and inoculation against influenza    -  Primary      Medications and PMH were reviewed and updated Recent strabismus surgery right eye (+) smoking   Review of Systems  Constitutional: Negative for fever and appetite change.  HENT: Negative for mouth sores.   Eyes: Negative for pain.  Respiratory: Negative for shortness of breath and wheezing.   Musculoskeletal: Positive for arthralgias and back pain. Negative for joint swelling.  Skin: Positive  for color change.  Psychiatric/Behavioral: Negative for hallucinations, sleep disturbance and dysphoric mood. The patient is not nervous/anxious.        Objective:   Physical Exam  Vitals reviewed. Constitutional: No distress.  obese  Eyes: Conjunctivae are normal.  Cardiovascular: Normal rate and regular rhythm.   No murmur heard. Pulmonary/Chest: Effort normal and breath sounds normal.  Musculoskeletal:       Left  shoulder: She exhibits decreased range of motion (pain with AROM > 90 degress) and tenderness (tender along angle of teres minor/infraspinatus with humerus). She exhibits no swelling, no crepitus and no deformity.  Skin:     Psychiatric: She has a normal mood and affect. Her behavior is normal. Thought content normal. Her affect is not blunt and not inappropriate. Her speech is not rapid and/or pressured, not delayed, not tangential and not slurred. She is not agitated, not aggressive, not hyperactive, not slowed and not withdrawn. Thought content is not paranoid. Cognition and memory are normal. Cognition and memory are not impaired. She does not exhibit a depressed mood. She exhibits normal recent memory. She is attentive.  Negative hawkins maneuver        Assessment & Plan:

## 2013-11-29 ENCOUNTER — Telehealth: Payer: Self-pay | Admitting: Family Medicine

## 2013-11-29 MED ORDER — HYDROCORTISONE VALERATE 0.2 % EX OINT: 1.0000 "application " | TOPICAL_OINTMENT | Freq: Every day | CUTANEOUS | Status: DC

## 2013-11-29 NOTE — Telephone Encounter (Signed)
rx resent to pharmacy. Evangeline Utley,CMA

## 2013-11-29 NOTE — Telephone Encounter (Signed)
Pt called because the prescription we called in on 8/26 for hydrocortisone was never received by the pharmacy. Can we re-call this in. jw

## 2013-12-17 ENCOUNTER — Ambulatory Visit (INDEPENDENT_AMBULATORY_CARE_PROVIDER_SITE_OTHER): Payer: Medicaid Other | Admitting: *Deleted

## 2013-12-17 DIAGNOSIS — M25562 Pain in left knee: Secondary | ICD-10-CM

## 2013-12-17 DIAGNOSIS — G8929 Other chronic pain: Secondary | ICD-10-CM

## 2013-12-17 MED ORDER — KETOROLAC TROMETHAMINE 60 MG/2ML IM SOLN
60.0000 mg | Freq: Once | INTRAMUSCULAR | Status: AC
  Administered 2013-12-17: 60 mg via INTRAMUSCULAR

## 2013-12-17 NOTE — Progress Notes (Signed)
   Pt in nurse clinic with left knee and shoulder pain.  Toradol injection 60 mg IM x 1 given today.  Pt advised to follow with PCP.  Will forward to PCP.  Clovis PuMartin, Tamika L, RN

## 2014-02-16 ENCOUNTER — Other Ambulatory Visit: Payer: Self-pay | Admitting: Family Medicine

## 2014-03-13 ENCOUNTER — Emergency Department (INDEPENDENT_AMBULATORY_CARE_PROVIDER_SITE_OTHER)
Admission: EM | Admit: 2014-03-13 | Discharge: 2014-03-13 | Disposition: A | Discharge status: Home / Self Care | Payer: Medicaid Other | Source: Home / Self Care | Attending: Family Medicine | Admitting: Family Medicine

## 2014-03-13 ENCOUNTER — Encounter (HOSPITAL_COMMUNITY): Payer: Self-pay

## 2014-03-13 DIAGNOSIS — H6012 Cellulitis of left external ear: Secondary | ICD-10-CM

## 2014-03-13 MED ORDER — MINOCYCLINE HCL 100 MG PO CAPS: 100.0000 mg | ORAL_CAPSULE | Freq: Two times a day (BID) | ORAL | Status: DC

## 2014-03-13 MED ORDER — MUPIROCIN 2 % EX OINT: 1.0000 "application " | TOPICAL_OINTMENT | Freq: Two times a day (BID) | CUTANEOUS | Status: DC
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
## 2014-03-13 NOTE — ED Notes (Signed)
Wore pair of fashion earrings yesterday , and started to have pain in her left ear lobe. C/o had problems sleeping due to pain

## 2014-03-20 ENCOUNTER — Ambulatory Visit (INDEPENDENT_AMBULATORY_CARE_PROVIDER_SITE_OTHER): Payer: Medicaid Other | Admitting: Family Medicine

## 2014-03-20 ENCOUNTER — Encounter: Payer: Self-pay | Admitting: Family Medicine

## 2014-03-20 VITALS — BP 134/100 | HR 110 | Temp 98.5°F | Ht 66.0 in | Wt 198.0 lb

## 2014-03-20 DIAGNOSIS — L298 Other pruritus: Secondary | ICD-10-CM

## 2014-03-20 DIAGNOSIS — N898 Other specified noninflammatory disorders of vagina: Secondary | ICD-10-CM

## 2014-03-20 LAB — POCT WET PREP (WET MOUNT): CLUE CELLS WET PREP WHIFF POC: NEGATIVE

## 2014-03-20 MED ORDER — FLUCONAZOLE 150 MG PO TABS: 150.0000 mg | ORAL_TABLET | Freq: Once | ORAL | Status: DC

## 2014-03-20 MED ORDER — METRONIDAZOLE 500 MG PO TABS: 500.0000 mg | ORAL_TABLET | Freq: Two times a day (BID) | ORAL | Status: DC

## 2014-03-20 NOTE — Assessment & Plan Note (Signed)
Mixed BV and yeast with buds, hyphae, clue cells and cocci - flagyl bid x7 days - diflucan now and after abx if still having symptoms

## 2014-03-20 NOTE — Patient Instructions (Signed)
Bacterial Vaginosis Bacterial vaginosis is a vaginal infection that occurs when the normal balance of bacteria in the vagina is disrupted. It results from an overgrowth of certain bacteria. This is the most common vaginal infection in women of childbearing age. Treatment is important to prevent complications, especially in pregnant women, as it can cause a premature delivery. CAUSES  Bacterial vaginosis is caused by an increase in harmful bacteria that are normally present in smaller amounts in the vagina. Several different kinds of bacteria can cause bacterial vaginosis. However, the reason that the condition develops is not fully understood. RISK FACTORS Certain activities or behaviors can put you at an increased risk of developing bacterial vaginosis, including:  Having a new sex partner or multiple sex partners.  Douching.  Using an intrauterine device (IUD) for contraception. Women do not get bacterial vaginosis from toilet seats, bedding, swimming pools, or contact with objects around them. SIGNS AND SYMPTOMS  Some women with bacterial vaginosis have no signs or symptoms. Common symptoms include:  Grey vaginal discharge.  A fishlike odor with discharge, especially after sexual intercourse.  Itching or burning of the vagina and vulva.  Burning or pain with urination. DIAGNOSIS  Your health care provider will take a medical history and examine the vagina for signs of bacterial vaginosis. A sample of vaginal fluid may be taken. Your health care provider will look at this sample under a microscope to check for bacteria and abnormal cells. A vaginal pH test may also be done.  TREATMENT  Bacterial vaginosis may be treated with antibiotic medicines. These may be given in the form of a pill or a vaginal cream. A second round of antibiotics may be prescribed if the condition comes back after treatment.  HOME CARE INSTRUCTIONS   Only take over-the-counter or prescription medicines as  directed by your health care provider.  If antibiotic medicine was prescribed, take it as directed. Make sure you finish it even if you start to feel better.  Do not have sex until treatment is completed.  Tell all sexual partners that you have a vaginal infection. They should see their health care provider and be treated if they have problems, such as a mild rash or itching.  Practice safe sex by using condoms and only having one sex partner. SEEK MEDICAL CARE IF:   Your symptoms are not improving after 3 days of treatment.  You have increased discharge or pain.  You have a fever. MAKE SURE YOU:   Understand these instructions.  Will watch your condition.  Will get help right away if you are not doing well or get worse. FOR MORE INFORMATION  Centers for Disease Control and Prevention, Division of STD Prevention: www.cdc.gov/std American Sexual Health Association (ASHA): www.ashastd.org  Document Released: 03/01/2005 Document Revised: 12/20/2012 Document Reviewed: 10/11/2012 ExitCare Patient Information 2015 [employer]. This information is not intended to replace advice given to you by your health care provider. Make sure you discuss any questions you have with your health care provider.  

## 2014-03-20 NOTE — Progress Notes (Signed)
   Subjective:    Patient ID: [name], female    DOB: 19-Dec-1980, 34 y.o.   MRN: [id number]  HPI Pt presents for vaginal itching and discharge. Patient seen in urgent care last week and placed on antibiotics for an ear cellulitis. For the past few days she has had vaginal itching and burning and white/lumpy discharge that "looks like skin peeling off." She has had yeast infections in the past that presented similarly and that is what she suspects is happening. No abd pain, fever, dysuria.   Review of Systems See HPI    Objective:   Physical Exam  Constitutional: She appears well-developed and well-nourished. No distress.  HENT:  Head: Normocephalic and atraumatic.  Eyes: Conjunctivae are normal. Right eye exhibits no discharge. Left eye exhibits no discharge. No scleral icterus.  Cardiovascular: Normal rate.   Pulmonary/Chest: Effort normal.  Abdominal: She exhibits no distension.  Genitourinary: Uterus normal. No labial fusion. There is no rash, tenderness, lesion or injury on the right labia. There is no rash, tenderness, lesion or injury on the left labia. Cervix exhibits no motion tenderness, no discharge and no friability. No erythema, tenderness or bleeding in the vagina. No foreign body around the vagina. No signs of injury around the vagina. Vaginal discharge found.  Moderate amount of thin white discharge  Skin: She is not diaphoretic.  Nursing note and vitals reviewed.         Assessment & Plan:

## 2014-03-23 ENCOUNTER — Encounter (HOSPITAL_COMMUNITY): Payer: Self-pay | Admitting: Emergency Medicine

## 2014-03-23 ENCOUNTER — Emergency Department (HOSPITAL_COMMUNITY)
Admission: EM | Admit: 2014-03-23 | Discharge: 2014-03-24 | Disposition: A | Discharge status: Home / Self Care | Payer: Medicaid Other | Attending: Emergency Medicine | Admitting: Emergency Medicine

## 2014-03-23 ENCOUNTER — Telehealth: Payer: Self-pay | Admitting: Family Medicine

## 2014-03-23 ENCOUNTER — Emergency Department (INDEPENDENT_AMBULATORY_CARE_PROVIDER_SITE_OTHER)
Admission: EM | Admit: 2014-03-23 | Discharge: 2014-03-23 | Disposition: A | Discharge status: Home / Self Care | Payer: Medicaid Other | Source: Home / Self Care | Attending: Emergency Medicine | Admitting: Emergency Medicine

## 2014-03-23 ENCOUNTER — Encounter (HOSPITAL_COMMUNITY): Payer: Self-pay | Admitting: *Deleted

## 2014-03-23 DIAGNOSIS — Z8742 Personal history of other diseases of the female genital tract: Secondary | ICD-10-CM | POA: Insufficient documentation

## 2014-03-23 DIAGNOSIS — I10 Essential (primary) hypertension: Secondary | ICD-10-CM | POA: Insufficient documentation

## 2014-03-23 DIAGNOSIS — G8929 Other chronic pain: Secondary | ICD-10-CM | POA: Diagnosis not present

## 2014-03-23 DIAGNOSIS — M1990 Unspecified osteoarthritis, unspecified site: Secondary | ICD-10-CM | POA: Insufficient documentation

## 2014-03-23 DIAGNOSIS — Z8619 Personal history of other infectious and parasitic diseases: Secondary | ICD-10-CM | POA: Diagnosis not present

## 2014-03-23 DIAGNOSIS — J4520 Mild intermittent asthma, uncomplicated: Secondary | ICD-10-CM | POA: Insufficient documentation

## 2014-03-23 DIAGNOSIS — Z79899 Other long term (current) drug therapy: Secondary | ICD-10-CM | POA: Insufficient documentation

## 2014-03-23 DIAGNOSIS — R112 Nausea with vomiting, unspecified: Secondary | ICD-10-CM

## 2014-03-23 DIAGNOSIS — Z792 Long term (current) use of antibiotics: Secondary | ICD-10-CM | POA: Insufficient documentation

## 2014-03-23 DIAGNOSIS — E876 Hypokalemia: Secondary | ICD-10-CM | POA: Diagnosis not present

## 2014-03-23 DIAGNOSIS — F259 Schizoaffective disorder, unspecified: Secondary | ICD-10-CM | POA: Diagnosis not present

## 2014-03-23 DIAGNOSIS — Z872 Personal history of diseases of the skin and subcutaneous tissue: Secondary | ICD-10-CM | POA: Insufficient documentation

## 2014-03-23 DIAGNOSIS — K219 Gastro-esophageal reflux disease without esophagitis: Secondary | ICD-10-CM | POA: Insufficient documentation

## 2014-03-23 DIAGNOSIS — Z87410 Personal history of cervical dysplasia: Secondary | ICD-10-CM | POA: Insufficient documentation

## 2014-03-23 DIAGNOSIS — Z720 Tobacco use: Secondary | ICD-10-CM | POA: Diagnosis not present

## 2014-03-23 DIAGNOSIS — Z9851 Tubal ligation status: Secondary | ICD-10-CM | POA: Diagnosis not present

## 2014-03-23 DIAGNOSIS — Z3202 Encounter for pregnancy test, result negative: Secondary | ICD-10-CM | POA: Insufficient documentation

## 2014-03-23 DIAGNOSIS — N390 Urinary tract infection, site not specified: Secondary | ICD-10-CM | POA: Insufficient documentation

## 2014-03-23 DIAGNOSIS — Z973 Presence of spectacles and contact lenses: Secondary | ICD-10-CM | POA: Diagnosis not present

## 2014-03-23 DIAGNOSIS — F329 Major depressive disorder, single episode, unspecified: Secondary | ICD-10-CM | POA: Insufficient documentation

## 2014-03-23 DIAGNOSIS — R1110 Vomiting, unspecified: Secondary | ICD-10-CM

## 2014-03-23 DIAGNOSIS — Z62810 Personal history of physical and sexual abuse in childhood: Secondary | ICD-10-CM | POA: Diagnosis not present

## 2014-03-23 LAB — LIPASE, BLOOD: Lipase: 23 U/L (ref 11–59)

## 2014-03-23 LAB — COMPREHENSIVE METABOLIC PANEL
ALBUMIN: 4.1 g/dL (ref 3.5–5.2)
ALT: 20 U/L (ref 0–35)
ANION GAP: 10 (ref 5–15)
AST: 24 U/L (ref 0–37)
Alkaline Phosphatase: 112 U/L (ref 39–117)
BUN: 6 mg/dL (ref 6–23)
CALCIUM: 9.1 mg/dL (ref 8.4–10.5)
CO2: 28 mmol/L (ref 19–32)
Chloride: 96 mEq/L (ref 96–112)
Creatinine, Ser: 0.99 mg/dL (ref 0.50–1.10)
GFR calc non Af Amer: 74 mL/min — ABNORMAL LOW (ref >90)
GFR, EST AFRICAN AMERICAN: 85 mL/min — AB (ref >90)
Glucose, Bld: 103 mg/dL — ABNORMAL HIGH (ref 70–99)
POTASSIUM: 2.9 mmol/L — AB (ref 3.5–5.1)
SODIUM: 134 mmol/L — AB (ref 135–145)
TOTAL PROTEIN: 6.7 g/dL (ref 6.0–8.3)
Total Bilirubin: 0.7 mg/dL (ref 0.3–1.2)

## 2014-03-23 LAB — CBC WITH DIFFERENTIAL/PLATELET
BASOS PCT: 1 % (ref 0–1)
Basophils Absolute: 0 10*3/uL (ref 0.0–0.1)
Eosinophils Absolute: 0.1 10*3/uL (ref 0.0–0.7)
Eosinophils Relative: 1 % (ref 0–5)
HCT: 46.5 % — ABNORMAL HIGH (ref 36.0–46.0)
HEMOGLOBIN: 16.5 g/dL — AB (ref 12.0–15.0)
Lymphocytes Relative: 58 % — ABNORMAL HIGH (ref 12–46)
Lymphs Abs: 3.5 10*3/uL (ref 0.7–4.0)
MCH: 30.7 pg (ref 26.0–34.0)
MCHC: 35.5 g/dL (ref 30.0–36.0)
MCV: 86.4 fL (ref 78.0–100.0)
Monocytes Absolute: 0.4 10*3/uL (ref 0.1–1.0)
Monocytes Relative: 7 % (ref 3–12)
NEUTROS ABS: 1.9 10*3/uL (ref 1.7–7.7)
NEUTROS PCT: 33 % — AB (ref 43–77)
Platelets: 183 10*3/uL (ref 150–400)
RBC: 5.38 MIL/uL — AB (ref 3.87–5.11)
RDW: 13.8 % (ref 11.5–15.5)
WBC: 5.9 10*3/uL (ref 4.0–10.5)

## 2014-03-23 LAB — URINALYSIS, ROUTINE W REFLEX MICROSCOPIC
Glucose, UA: NEGATIVE mg/dL
Hgb urine dipstick: NEGATIVE
Ketones, ur: 40 mg/dL — AB
Nitrite: POSITIVE — AB
PH: 5.5 (ref 5.0–8.0)
PROTEIN: NEGATIVE mg/dL
SPECIFIC GRAVITY, URINE: 1.029 (ref 1.005–1.030)
Urobilinogen, UA: 0.2 mg/dL (ref 0.0–1.0)

## 2014-03-23 LAB — URINE MICROSCOPIC-ADD ON

## 2014-03-23 LAB — PREGNANCY, URINE: PREG TEST UR: NEGATIVE

## 2014-03-23 MED ORDER — ONDANSETRON HCL 4 MG/2ML IJ SOLN
4.0000 mg | Freq: Once | INTRAMUSCULAR | Status: AC
  Administered 2014-03-23: 4 mg via INTRAMUSCULAR

## 2014-03-23 MED ORDER — SODIUM CHLORIDE 0.9 % IV BOLUS (SEPSIS)
1000.0000 mL | Freq: Once | INTRAVENOUS | Status: AC
  Administered 2014-03-23: 1000 mL via INTRAVENOUS

## 2014-03-23 MED ORDER — ONDANSETRON HCL 4 MG/2ML IJ SOLN
INTRAMUSCULAR | Status: AC
  Filled 2014-03-23: qty 2

## 2014-03-23 MED ORDER — POTASSIUM CHLORIDE ER 10 MEQ PO TBCR: 10.0000 meq | EXTENDED_RELEASE_TABLET | Freq: Every day | ORAL | Status: DC

## 2014-03-23 MED ORDER — PROMETHAZINE HCL 25 MG/ML IJ SOLN
25.0000 mg | Freq: Once | INTRAMUSCULAR | Status: AC
  Administered 2014-03-23: 25 mg via INTRAVENOUS
  Filled 2014-03-23: qty 1

## 2014-03-23 MED ORDER — POTASSIUM CHLORIDE CRYS ER 20 MEQ PO TBCR
20.0000 meq | EXTENDED_RELEASE_TABLET | Freq: Once | ORAL | Status: AC
  Administered 2014-03-23: 20 meq via ORAL
  Filled 2014-03-23: qty 1

## 2014-03-23 MED ORDER — ONDANSETRON 4 MG PO TBDP: 4.0000 mg | ORAL_TABLET | Freq: Three times a day (TID) | ORAL | Status: DC | PRN

## 2014-03-23 MED ORDER — POTASSIUM CHLORIDE 10 MEQ/100ML IV SOLN
10.0000 meq | Freq: Once | INTRAVENOUS | Status: AC
  Administered 2014-03-23: 10 meq via INTRAVENOUS
  Filled 2014-03-23: qty 100

## 2014-03-23 MED ORDER — NITROFURANTOIN MONOHYD MACRO 100 MG PO CAPS: 100.0000 mg | ORAL_CAPSULE | Freq: Two times a day (BID) | ORAL | Status: DC

## 2014-03-23 MED ORDER — PROMETHAZINE HCL 25 MG PO TABS: 25.0000 mg | ORAL_TABLET | Freq: Four times a day (QID) | ORAL | Status: DC | PRN
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
## 2014-03-23 NOTE — ED Notes (Signed)
Pt has been d/c... [facility]. Mother and pt verbalized understanding.

## 2014-03-23 NOTE — ED Notes (Signed)
Pt attemtping to give urine sample.

## 2014-03-23 NOTE — Discharge Instructions (Signed)
Your testing shows that you likely have a urine infection - please take the medicines as prescribed below:  1.  Phenergan or Zofran for nausea 2.  Macrobid two times daily for the urine infection.  Please call your doctor for a followup appointment within 24-48 hours. When you talk to your doctor please let them know that you were seen in the emergency department and have them acquire all of your records so that they can discuss the findings with you and formulate a treatment plan to fully care for your new and ongoing problems.

## 2014-03-23 NOTE — Telephone Encounter (Signed)
[facility]  Patient calling because she has been on antibiotics x 1.5 weeks and has been vomiting profusely 3 days straight. 12/30 minocycline was prescribed for ear skin infection. She feels very weak now and drained and does not know what to do. Reports abdominal pain when she vomits but otherwise no pain. Unable to keep food down. Sees some strings of red stuff that could be blood or medicine in her emesis. No diarrhea. She has not had fevers. Ear knot is gone and swelling is gone. Today was last day for minocycline. Has been unable to keep down food 3-4 days. She is not peeing much.   Discussed that she is likely dehydrated due to reaction / stomach upset from antibiotics. This should improve now that she is finished with minocycline, but I do recommend she be evaluated to be sure nothing else is going on. I recommended UC or ED, depending on patient preference. I let her know if they think this is just side effect from the antibiotic, they may prescribe zofran to help with nausea so she can improve hydration. I also briefly discussed c diff but told her this is unlikely with no fever and no diarrhea. She voiced understanding.   Leona SingletonMaria T Dannica Bickham, MD

## 2014-03-23 NOTE — Discharge Instructions (Signed)
Please have your mother [facility] and [facility].  Nausea and Vomiting Nausea is a sick feeling that often comes before throwing up (vomiting). Vomiting is a reflex where stomach contents come out of your mouth. Vomiting can cause severe loss of body fluids (dehydration). Children and elderly adults can become dehydrated quickly, especially if they also have diarrhea. Nausea and vomiting are symptoms of a condition or disease. It is important to find the cause of your symptoms. CAUSES   Direct irritation of the stomach lining. This irritation can result from increased acid production (gastroesophageal reflux disease), infection, food poisoning, taking certain medicines (such as nonsteroidal anti-inflammatory drugs), alcohol use, or tobacco use.  Signals from the brain.These signals could be caused by a headache, heat exposure, an inner ear disturbance, increased pressure in the brain from injury, infection, a tumor, or a concussion, pain, emotional stimulus, or metabolic problems.  An obstruction in the gastrointestinal tract (bowel obstruction).  Illnesses such as diabetes, hepatitis, gallbladder problems, appendicitis, kidney problems, cancer, sepsis, atypical symptoms of a heart attack, or eating disorders.  Medical treatments such as chemotherapy and radiation.  Receiving medicine that makes you sleep (general anesthetic) during surgery. DIAGNOSIS Your caregiver may ask for tests to be done if the problems do not improve after a few days. Tests may also be done if symptoms are severe or if the reason for the nausea and vomiting is not clear. Tests may include:  Urine tests.  Blood tests.  Stool tests.  Cultures (to look for evidence of infection).  X-rays or other imaging studies. Test results can help your caregiver make decisions about treatment or the need for additional tests. TREATMENT You need to stay well hydrated. Drink  frequently but in small amounts.You may wish to drink water, sports drinks, clear broth, or eat frozen ice pops or gelatin dessert to help stay hydrated.When you eat, eating slowly may help prevent nausea.There are also some antinausea medicines that may help prevent nausea. HOME CARE INSTRUCTIONS   Take all medicine as directed by your caregiver.  If you do not have an appetite, do not force yourself to eat. However, you must continue to drink fluids.  If you have an appetite, eat a normal diet unless your caregiver tells you differently.  Eat a variety of complex carbohydrates (rice, wheat, potatoes, bread), lean meats, yogurt, fruits, and vegetables.  Avoid high-fat foods because they are more difficult to digest.  Drink enough water and fluids to keep your urine clear or pale yellow.  If you are dehydrated, ask your caregiver for specific rehydration instructions. Signs of dehydration may include:  Severe thirst.  Dry lips and mouth.  Dizziness.  Dark urine.  Decreasing urine frequency and amount.  Confusion.  Rapid breathing or pulse. SEEK IMMEDIATE MEDICAL CARE IF:   You have blood or brown flecks (like coffee grounds) in your vomit.  You have black or bloody stools.  You have a severe headache or stiff neck.  You are confused.  You have severe abdominal pain.  You have chest pain or trouble breathing.  You do not urinate at least once every 8 hours.  You develop cold or clammy skin.  You continue to vomit for longer than 24 to 48 hours.  You have a fever. MAKE SURE YOU:   Understand these instructions.  Will watch your condition.  Will get help right away if you are not doing well or get worse. Document Released: 03/01/2005  Document Revised: 05/24/2011 Document Reviewed: 07/29/2010 [facility] Patient Information 2015 [place]. This information is not intended to replace advice given to you by your health care provider. Make sure you discuss  any questions you have with your health care provider.

## 2014-03-23 NOTE — ED Notes (Signed)
Pt aware of urine sample needed, pt states unable to go at this time.

## 2014-03-23 NOTE — ED Notes (Signed)
Pt in c/o vomiting since Wednesday, states she was recently started on multiple antibiotics for vaginitis and since she started them she has been vomiting. Went to urgent care tonight and sent here for further evaluation.

## 2014-03-23 NOTE — ED Notes (Signed)
Reports she has been vomiting since Wednesday Reports she was placed on minocycline on 03/13/14 and Flagyl on 03/20/14 Sx today also include nauseas and abd pain Alert, no signs of acute distress.
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
## 2014-03-25 ENCOUNTER — Encounter: Payer: Self-pay | Admitting: Family Medicine

## 2014-03-25 LAB — URINE CULTURE
CULTURE: NO GROWTH
Colony Count: NO GROWTH
Special Requests: NORMAL

## 2014-04-06 ENCOUNTER — Other Ambulatory Visit: Payer: Self-pay | Admitting: Family Medicine

## 2014-04-10 ENCOUNTER — Ambulatory Visit (INDEPENDENT_AMBULATORY_CARE_PROVIDER_SITE_OTHER): Payer: Medicaid Other | Admitting: Family Medicine

## 2014-04-10 ENCOUNTER — Encounter: Payer: Self-pay | Admitting: Family Medicine

## 2014-04-10 VITALS — BP 122/85 | HR 85 | Temp 98.4°F | Ht 66.0 in | Wt 193.0 lb

## 2014-04-10 DIAGNOSIS — R202 Paresthesia of skin: Secondary | ICD-10-CM

## 2014-04-10 NOTE — Progress Notes (Signed)
    Subjective   [name] is a 34 y.o. female that presents for a same day visit  1. Right and left hand numbness: symptoms occurred last night. Lasted for about an hour. This has occurred once or twice before. She has no history of DM, hypothyroidism and has had her tubes tied so no chance of pregnancy. She is a stay at home mother. She denies any recent trauma.   History  Substance Use Topics  . Smoking status: Current Every Day Smoker -- 0.25 packs/day for 15 years    Types: Cigarettes  . Smokeless tobacco: Never Used     Comment: 6 CIG PER DAY - per pt  . Alcohol Use: 7.0 oz/week    14 drink(s) per week     Comment: 2 drinks per day    ROS Per HPI  Objective   BP 122/85 mmHg  Pulse 85  Temp(Src) 98.4 F (36.9 C) (Oral)  Ht 5\' 6"  (1.676 m)  Wt 193 lb (87.544 kg)  BMI 31.17 kg/m2  General: Well appearing, NAD  Ext: Neurovascularly intact, 5/5 strength in UE b/l, sensation intact b/l UE,  Thumb/hand Exam:  Laterality: right Appearance: no erythema, symmetric  Edema: no  Tenderness: no  Palpation:  No metacarpal tenderness, no snuffbox tenderness  Range of Motion:  Wrist Flexion: normal Wrist Extension:normal Wrist Ulnar deviation: normal Wrist Radial deviation: normal  Thumb flexion:normal  Thumb extension:normal  Thumb opposition: normal Normal grip strength  Maneuvers: Tinel's: pos  Phalen's: pos  Strength:  Wrist extension: 5/5 Wrist flexion: 5/5 Ulnar Deviation: 5/5 Radial Deviation: 5/5  Thumb/hand Exam:  Laterality: left Appearance: no erythema, symmetric  Edema: no  Tenderness: no  Palpation: no metacarpal tenderness or snuff box tenderness   Range of Motion:  Wrist Flexion: normal Wrist Extension:normal Wrist Ulnar deviation: normal Wrist Radial deviation: normal  Thumb flexion:normal  Thumb extension:normal  Thumb opposition: normal Normal Grip strength  Maneuvers:  Tinel's: pos Phalen's: pos Strength:  Wrist extension:  5/5 Wrist flexion: 5/5 Ulnar Deviation: 5/5 Radial Deviation: 5/5    Assessment and Plan   Please refer to problem based charting of assessment and plan

## 2014-04-10 NOTE — Assessment & Plan Note (Signed)
Most likely carpal tunnel syndrome. Possibly related to her daily living. No suggestion of having DM, hypothyroidism or being pregnant.  - ibuprofen for pain  - cock up splint b/l at night.  - if no improvement in 3-4 weeks. Consider lab testing (hgb A1c, TSH)

## 2014-04-10 NOTE — Patient Instructions (Signed)
Thank you for coming in,   Try the wrist splints for 3-4 weeks. If you don't have any improvement follow up with me or Dr. Perley JainMcDiarmid.    Please feel free to call with any questions or concerns at any time, at [phone]. --Dr. Jordan LikesSchmitz

## 2014-04-12 ENCOUNTER — Telehealth: Payer: Self-pay | Admitting: Family Medicine

## 2014-04-12 DIAGNOSIS — N760 Acute vaginitis: Secondary | ICD-10-CM

## 2014-04-12 DIAGNOSIS — N898 Other specified noninflammatory disorders of vagina: Secondary | ICD-10-CM

## 2014-04-12 DIAGNOSIS — B9689 Other specified bacterial agents as the cause of diseases classified elsewhere: Secondary | ICD-10-CM

## 2014-04-12 NOTE — Telephone Encounter (Signed)
Shirley [name] called and asked if she could on the metronidazole 500 mg.tab because her BV is still prevalent and she need relief.  If she need to be seen please have someone call her.

## 2014-04-12 NOTE — Telephone Encounter (Signed)
Please call patient to get details of reason she needs metronidazole, include details about vaginal discharge, odor, pelvic pain, fever, vaginal itching.

## 2014-04-15 MED ORDER — METRONIDAZOLE 500 MG PO TABS: 500.0000 mg | ORAL_TABLET | Freq: Two times a day (BID) | ORAL | Status: DC

## 2014-04-15 NOTE — Telephone Encounter (Signed)
[name] reports that when she takes took the metronidazole by itself, without other antibiotics, it did not make her sick. Will repeat course of metronidazole for one week.  If the vaginal discharge does not resolve, she will need to [facility] to have the vaginal discharge re-evaluated.

## 2014-04-15 NOTE — Telephone Encounter (Signed)
Pt states that she is still having the discharge.  She was seen in ED and was informed to stop taking the medication. She just needs a new rx to get started on it again. Emilina Smarr,CMA

## 2014-04-18 ENCOUNTER — Telehealth: Payer: Self-pay | Admitting: Family Medicine

## 2014-04-18 NOTE — Telephone Encounter (Signed)
Will forward to Dr. Jordan LikesSchmitz who saw patient for this visit. Jairen Goldfarb,CMA

## 2014-04-18 NOTE — Telephone Encounter (Signed)
Pt called and would like the doctor to call in something for her yeast infection that she got from taking antibiotics. jw

## 2014-04-18 NOTE — Telephone Encounter (Signed)
Will forward to MD. Blakelee Allington,CMA  

## 2014-04-19 MED ORDER — FLUCONAZOLE 150 MG PO TABS: ORAL_TABLET | ORAL | Status: DC

## 2014-04-19 NOTE — Telephone Encounter (Signed)
Please send rx in for patient's yeast infection.

## 2014-04-19 NOTE — Telephone Encounter (Signed)
[facility] [address]. Please inform patient.

## 2014-04-19 NOTE — Telephone Encounter (Signed)
Pt is aware of medication.  Navdeep Halt,CMA  

## 2014-05-24 ENCOUNTER — Ambulatory Visit (INDEPENDENT_AMBULATORY_CARE_PROVIDER_SITE_OTHER): Payer: Medicaid Other | Admitting: *Deleted

## 2014-05-24 DIAGNOSIS — M25511 Pain in right shoulder: Secondary | ICD-10-CM

## 2014-05-24 DIAGNOSIS — G8929 Other chronic pain: Secondary | ICD-10-CM

## 2014-05-24 MED ORDER — KETOROLAC TROMETHAMINE 60 MG/2ML IM SOLN
60.0000 mg | Freq: Once | INTRAMUSCULAR | Status: AC
  Administered 2014-05-24: 60 mg via INTRAMUSCULAR

## 2014-05-24 NOTE — Progress Notes (Signed)
   Pt in nurse clinic for Toradol injection.  Pt can receive Toradol once a week per Dr. McDiarmid for pain.  Pt stated she is having pain in her back and shoulders. Toradol 60 mg/2 ML given LUOQ.  Pt scheduled an appt with PCP 07/11/14 at 9:30 AM.

## 2014-05-26 ENCOUNTER — Other Ambulatory Visit: Payer: Self-pay | Admitting: Family Medicine

## 2014-05-27 ENCOUNTER — Telehealth: Payer: Self-pay | Admitting: Psychology

## 2014-05-27 NOTE — Telephone Encounter (Signed)
[name] called to see if she could schedule an appointment for therapy.  She has been going to [facility] - gets her medicine there.  Last time she went, she had a "break down" and they suggested counseling.  She reports they only offer group counseling and this is not a good match for her.    Told her about my new role in providing integrated care rather than the traditional services I had been providing.  Explored that as a possibility but I think she might need more intensive therapy services.  Gave her the number for both [facility] and [facility].  Recommended [facility] first as they do a good job with Dialectical Behavior Therapy and that might be a good match for her.  Will let Dr. Levonne LappingMcD know.

## 2014-06-13 ENCOUNTER — Other Ambulatory Visit: Payer: Self-pay | Admitting: Family Medicine

## 2014-06-13 MED ORDER — TRIAMCINOLONE ACETONIDE 0.1 % EX CREA: 1.0000 "application " | TOPICAL_CREAM | Freq: Two times a day (BID) | CUTANEOUS | Status: DC

## 2014-06-25 ENCOUNTER — Encounter: Payer: Self-pay | Admitting: Family Medicine

## 2014-06-25 ENCOUNTER — Telehealth: Payer: Self-pay | Admitting: Family Medicine

## 2014-06-25 ENCOUNTER — Other Ambulatory Visit (HOSPITAL_COMMUNITY)
Admission: RE | Admit: 2014-06-25 | Discharge: 2014-06-25 | Disposition: A | Discharge status: Home / Self Care | Payer: Medicaid Other | Source: Ambulatory Visit | Attending: Family Medicine | Admitting: Family Medicine

## 2014-06-25 ENCOUNTER — Ambulatory Visit (INDEPENDENT_AMBULATORY_CARE_PROVIDER_SITE_OTHER): Payer: Medicaid Other | Admitting: Family Medicine

## 2014-06-25 VITALS — BP 119/86 | HR 108 | Temp 98.4°F | Ht 66.0 in | Wt 194.1 lb

## 2014-06-25 DIAGNOSIS — N939 Abnormal uterine and vaginal bleeding, unspecified: Secondary | ICD-10-CM

## 2014-06-25 DIAGNOSIS — Z113 Encounter for screening for infections with a predominantly sexual mode of transmission: Secondary | ICD-10-CM | POA: Diagnosis not present

## 2014-06-25 DIAGNOSIS — A499 Bacterial infection, unspecified: Secondary | ICD-10-CM

## 2014-06-25 DIAGNOSIS — Z711 Person with feared health complaint in whom no diagnosis is made: Secondary | ICD-10-CM | POA: Diagnosis not present

## 2014-06-25 DIAGNOSIS — N760 Acute vaginitis: Secondary | ICD-10-CM | POA: Diagnosis not present

## 2014-06-25 DIAGNOSIS — N898 Other specified noninflammatory disorders of vagina: Secondary | ICD-10-CM

## 2014-06-25 DIAGNOSIS — B9689 Other specified bacterial agents as the cause of diseases classified elsewhere: Secondary | ICD-10-CM

## 2014-06-25 LAB — POCT WET PREP (WET MOUNT): Clue Cells Wet Prep Whiff POC: NEGATIVE

## 2014-06-25 LAB — POCT URINE PREGNANCY: Preg Test, Ur: NEGATIVE

## 2014-06-25 MED ORDER — FLUCONAZOLE 150 MG PO TABS: ORAL_TABLET | ORAL | Status: DC

## 2014-06-25 MED ORDER — METRONIDAZOLE 500 MG PO TABS: 500.0000 mg | ORAL_TABLET | Freq: Two times a day (BID) | ORAL | Status: DC

## 2014-06-25 NOTE — Assessment & Plan Note (Signed)
Vaginal bleeding 4 days ago that has mostly resolved. Possibly related to vaginal discharge with infection causing cervical irritation. History of tubal removal and LMP in 2014. No current symptoms of anemia except mild tachycardia that is nonspecific. -Urine pregnancy test, wet prep, GC chlamydia to evaluate for infection -Given description of discharge, we'll treat empirically with Flagyl. Patient denies current alcohol use. -Return if bleeding does not completely resolve or returns at which point would perform transvaginal ultrasound, TSH, CBC, CMET

## 2014-06-25 NOTE — Telephone Encounter (Signed)
Called patient to let her know wet prep showed yeast, not BV. She has not yet picked up flagyl. D/c'ed this and sent in diflucan for pt.  Shirley SingletonMaria T Daquon Greenleaf, MD

## 2014-06-25 NOTE — Patient Instructions (Addendum)
This bleeding is most likely due to a vaginal infection. We are doing orders today and I will call you if NOT normal. Your symptoms sound most like bacterial vaginosis (a vaginal infection, NOT an STD). Take flagyl twice daily for 7 days. Follow up if your bleeding, which is almost gone, returns and we would do more workup. Seek immediate care if severe fevers, abdominal pain, or other concerns.  Best,  Shirley SingletonMaria T Dylan Monforte, MD

## 2014-06-25 NOTE — Progress Notes (Signed)
Patient ID: Christene LyeShadonna P Rho, female   DOB: Aug 04, 1980, 34 y.o.   MRN: [id number] Subjective:   CC: Vaginal bleeding  HPI:   Patient presents with vaginal bleeding 4 days ago that was slightly less than a period. Since then bleeding has diminished and now just has occasional spotting. She denies fevers, chills, abdominal pain, nausea, vomiting, diarrhea, or back pain. She also had thick yellowish vaginal mucus 2 weeks ago and still has mild discharge and some vaginal odor with no vaginal pain, itching, or dysuria. She reports history of tubal removal after getting pregnant status post BTL. She is not taking any hormones, denies new medications, and is not concerned about STD. LMP 2014.  Review of Systems - Per HPI. Additionally, no dyspnea or chest pain. No increase in fatigue or shortness of breath.  PMH: History of alcohol abuse, intermittent asthma, bipolar disorder, GERD, anxiety disorder, ADHD, cervical dysplasia, hypertension, obesity, OA, schizoaffective disorder, tobacco dependence     Objective:  Physical Exam BP 119/86 mmHg  Pulse 108  Temp(Src) 98.4 F (36.9 C) (Oral)  Ht 5\' 6"  (1.676 m)  Wt 194 lb 1 oz (88.026 kg)  BMI 31.34 kg/m2 GEN: NAD Pulm: normal effort Abdomen: Soft, nontender, nondistended GU: Normal external genitalia, vaginal mucosa with minimal clear discharge seen on cervix, mild cervical irritation with no active bleeding, no cervical motion tenderness, normal size and mobility of uterus and adnexa with no lesions palpable     Assessment:     Christene LyeShadonna P Blasius is a 34 y.o. female here for vaginal bleeding.    Plan:     # See problem list and after visit summary for problem-specific plans.   # Health Maintenance: STD testing today including HIV and RPR    Follow-up: Follow up 2 weeks if bleeding has not resolved for further workup.   Leona SingletonMaria T Czarina Gingras, MD [facility]

## 2014-06-26 ENCOUNTER — Encounter: Payer: Self-pay | Admitting: Family Medicine

## 2014-06-26 LAB — GC/CHLAMYDIA PROBE AMP (~~LOC~~) NOT AT ARMC
CHLAMYDIA, DNA PROBE: NEGATIVE
NEISSERIA GONORRHEA: NEGATIVE

## 2014-06-26 LAB — HIV ANTIBODY (ROUTINE TESTING W REFLEX): HIV: NONREACTIVE

## 2014-06-26 LAB — RPR

## 2014-06-26 NOTE — Progress Notes (Signed)
One of the clinic preceptor of the day. 

## 2014-06-30 ENCOUNTER — Encounter (HOSPITAL_COMMUNITY): Payer: Self-pay

## 2014-06-30 ENCOUNTER — Emergency Department (INDEPENDENT_AMBULATORY_CARE_PROVIDER_SITE_OTHER)
Admission: EM | Admit: 2014-06-30 | Discharge: 2014-06-30 | Disposition: A | Discharge status: Home / Self Care | Payer: Medicaid Other | Source: Home / Self Care | Attending: Family Medicine | Admitting: Family Medicine

## 2014-06-30 DIAGNOSIS — J45901 Unspecified asthma with (acute) exacerbation: Secondary | ICD-10-CM

## 2014-06-30 DIAGNOSIS — J301 Allergic rhinitis due to pollen: Secondary | ICD-10-CM

## 2014-06-30 MED ORDER — TRIAMCINOLONE ACETONIDE 40 MG/ML IJ SUSP
40.0000 mg | Freq: Once | INTRAMUSCULAR | Status: AC
  Administered 2014-06-30: 40 mg via INTRAMUSCULAR

## 2014-06-30 MED ORDER — TRIAMCINOLONE ACETONIDE 40 MG/ML IJ SUSP
INTRAMUSCULAR | Status: AC
  Filled 2014-06-30: qty 1

## 2014-06-30 MED ORDER — IPRATROPIUM-ALBUTEROL 0.5-2.5 (3) MG/3ML IN SOLN
3.0000 mL | Freq: Once | RESPIRATORY_TRACT | Status: AC
  Administered 2014-06-30: 3 mL via RESPIRATORY_TRACT

## 2014-06-30 MED ORDER — ALBUTEROL SULFATE HFA 108 (90 BASE) MCG/ACT IN AERS: 2.0000 | INHALATION_SPRAY | RESPIRATORY_TRACT | Status: DC | PRN

## 2014-06-30 MED ORDER — PREDNISONE 20 MG PO TABS: ORAL_TABLET | ORAL | Status: DC

## 2014-06-30 MED ORDER — ALBUTEROL SULFATE (2.5 MG/3ML) 0.083% IN NEBU
INHALATION_SOLUTION | RESPIRATORY_TRACT | Status: AC
  Filled 2014-06-30: qty 3

## 2014-06-30 MED ORDER — IPRATROPIUM-ALBUTEROL 0.5-2.5 (3) MG/3ML IN SOLN
RESPIRATORY_TRACT | Status: AC
  Filled 2014-06-30: qty 3

## 2014-06-30 MED ORDER — ALBUTEROL SULFATE (2.5 MG/3ML) 0.083% IN NEBU
2.5000 mg | INHALATION_SOLUTION | Freq: Once | RESPIRATORY_TRACT | Status: AC
  Administered 2014-06-30: 2.5 mg via RESPIRATORY_TRACT
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
## 2014-06-30 NOTE — Discharge Instructions (Signed)
Allergic Rhinitis May use Rhinocort or Nasacort nasal spray to help with allergies in addition to taking Zyrtec or Allegra. Use lots of saline nasal spray.  albuterol HFA inhaler 2 puffs every 4-6 hours for cough and trouble breathing. Allergic rhinitis is when the mucous membranes in the nose respond to allergens. Allergens are particles in the air that cause your body to have an allergic reaction. This causes you to release allergic antibodies. Through a chain of events, these eventually cause you to release histamine into the blood stream. Although meant to protect the body, it is this release of histamine that causes your discomfort, such as frequent sneezing, congestion, and an itchy, runny nose.  CAUSES  Seasonal allergic rhinitis (hay fever) is caused by pollen allergens that may come from grasses, trees, and weeds. Year-round allergic rhinitis (perennial allergic rhinitis) is caused by allergens such as house dust mites, pet dander, and mold spores.  SYMPTOMS   Nasal stuffiness (congestion).  Itchy, runny nose with sneezing and tearing of the eyes. DIAGNOSIS  Your health care provider can help you determine the allergen or allergens that trigger your symptoms. If you and your health care provider are unable to determine the allergen, skin or blood testing may be used. TREATMENT  Allergic rhinitis does not have a cure, but it can be controlled by:  Medicines and allergy shots (immunotherapy).  Avoiding the allergen. Hay fever may often be treated with antihistamines in pill or nasal spray forms. Antihistamines block the effects of histamine. There are over-the-counter medicines that may help with nasal congestion and swelling around the eyes. Check with your health care provider before taking or giving this medicine.  If avoiding the allergen or the medicine prescribed do not work, there are many new medicines your  health care provider can prescribe. Stronger medicine may be used if initial measures are ineffective. Desensitizing injections can be used if medicine and avoidance does not work. Desensitization is when a patient is given ongoing shots until the body becomes less sensitive to the allergen. Make sure you follow up with your health care provider if problems continue. HOME CARE INSTRUCTIONS It is not possible to completely avoid allergens, but you can reduce your symptoms by taking steps to limit your exposure to them. It helps to know exactly what you are allergic to so that you can avoid your specific triggers. SEEK MEDICAL CARE IF:   You have a fever.  You develop a cough that does not stop easily (persistent).  You have shortness of breath.  You start wheezing.  Symptoms interfere with normal daily activities. Document Released: 11/24/2000 Document Revised: 03/06/2013 Document Reviewed: 11/06/2012 [facility] Patient Information 2015 [place]. This information is not intended to replace advice given to you by your health care provider. Make sure you discuss any questions you have with your health care provider.  Asthma, Acute Bronchospasm Acute bronchospasm caused by asthma is also referred to as an asthma attack. Bronchospasm means your air passages become narrowed. The narrowing is caused by inflammation and tightening of the muscles in the air tubes (bronchi) in your lungs. This can make it hard to breathe or cause you to wheeze and cough. CAUSES Possible triggers are:  Animal dander from the skin, hair, or feathers of animals.  Dust mites contained in house dust.  Cockroaches.  Pollen from trees or grass.  Mold.  Cigarette or tobacco smoke.  Air pollutants such as dust, household cleaners, hair sprays, aerosol sprays, paint fumes, strong chemicals, or  strong odors.  Cold air or weather changes. Cold air may trigger inflammation. Winds increase molds and pollens in the  air.  Strong emotions such as crying or laughing hard.  Stress.  Certain medicines such as aspirin or beta-blockers.  Sulfites in foods and drinks, such as dried fruits and wine.  Infections or inflammatory conditions, such as a flu, cold, or inflammation of the nasal membranes (rhinitis).  Gastroesophageal reflux disease (GERD). GERD is a condition where stomach acid backs up into your esophagus.  Exercise or strenuous activity. SIGNS AND SYMPTOMS   Wheezing.  Excessive coughing, particularly at night.  Chest tightness.  Shortness of breath. DIAGNOSIS  Your health care provider will ask you about your medical history and perform a physical exam. A chest X-ray or blood testing may be performed to look for other causes of your symptoms or other conditions that may have triggered your asthma attack. TREATMENT  Treatment is aimed at reducing inflammation and opening up the airways in your lungs. Most asthma attacks are treated with inhaled medicines. These include quick relief or rescue medicines (such as bronchodilators) and controller medicines (such as inhaled corticosteroids). These medicines are sometimes given through an inhaler or a nebulizer. Systemic steroid medicine taken by mouth or given through an IV tube also can be used to reduce the inflammation when an attack is moderate or severe. Antibiotic medicines are only used if a bacterial infection is present.  HOME CARE INSTRUCTIONS   Rest.  Drink plenty of liquids. This helps the mucus to remain thin and be easily coughed up. Only use caffeine in moderation and do not use alcohol until you have recovered from your illness.  Do not smoke. Avoid being exposed to secondhand smoke.  You play a critical role in keeping yourself in good health. Avoid exposure to things that cause you to wheeze or to have breathing problems.  Keep your medicines up-to-date and available. Carefully follow your health care provider's treatment  plan.  Take your medicine exactly as prescribed.  When pollen or pollution is bad, keep windows closed and use an air conditioner or go to places with air conditioning.  Asthma requires careful medical care. See your health care provider for a follow-up as advised. If you are more than [redacted] weeks pregnant and you were prescribed any new medicines, let your obstetrician know about the visit and how you are doing. Follow up with your health care provider as directed.  After you have recovered from your asthma attack, make an appointment with your outpatient doctor to talk about ways to reduce the likelihood of future attacks. If you do not have a doctor who manages your asthma, make an appointment with a primary care doctor to discuss your asthma. SEEK IMMEDIATE MEDICAL CARE IF:   You are getting worse.  You have trouble breathing. If severe, call your local emergency services (911 in the [place]  You develop chest pain or discomfort.  You are vomiting.  You are not able to keep fluids down.  You are coughing up yellow, green, brown, or bloody sputum.  You have a fever and your symptoms suddenly get worse.  You have trouble swallowing. MAKE SURE YOU:   Understand these instructions.  Will watch your condition.  Will get help right away if you are not doing well or get worse. Document Released: 06/16/2006 Document Revised: 03/06/2013 Document Reviewed: 09/06/2012 [facility] Patient Information 2015 [place], [place]. This information is not intended to replace advice given to you by your  health care provider. Make sure you discuss any questions you have with your health care provider.

## 2014-06-30 NOTE — ED Notes (Signed)
[facility] 4[facility]12 for URI type symptoms that reportedly started 4-11. C/o still not feeling well. NAD at present

## 2014-07-01 ENCOUNTER — Telehealth: Payer: Self-pay | Admitting: Family Medicine

## 2014-07-01 DIAGNOSIS — M25511 Pain in right shoulder: Principal | ICD-10-CM

## 2014-07-01 DIAGNOSIS — G8929 Other chronic pain: Secondary | ICD-10-CM

## 2014-07-01 DIAGNOSIS — M47817 Spondylosis without myelopathy or radiculopathy, lumbosacral region: Secondary | ICD-10-CM

## 2014-07-01 NOTE — Telephone Encounter (Signed)
Will forward to MD to see if it is possible for patient to increase her dose of this medication. Anchor Dwan,CMA

## 2014-07-01 NOTE — Telephone Encounter (Signed)
Pt called and would like the dosage of her Zyrtec increased. jw

## 2014-07-02 MED ORDER — MONTELUKAST SODIUM 10 MG PO TABS: 10.0000 mg | ORAL_TABLET | Freq: Every day | ORAL | Status: DC

## 2014-07-02 MED ORDER — DESIPRAMINE HCL 25 MG PO TABS: 25.0000 mg | ORAL_TABLET | Freq: Every day | ORAL | Status: DC

## 2014-07-02 NOTE — Telephone Encounter (Signed)
Singulair sent in. I have not been prescribing Desipramine for patient.  He may want to ask [facility] about refill of this medication.

## 2014-07-02 NOTE — Telephone Encounter (Signed)
Prescription was from September of last year.  It was for chronic arthritis pain. Will refill. Rx desipramine sent to her [facility].

## 2014-07-02 NOTE — Telephone Encounter (Signed)
Pt is aware.  Jazmin Hartsell,CMA  

## 2014-07-02 NOTE — Telephone Encounter (Signed)
Spoke with patient and she is agreeable to adding singulair to her regiment.  She also would like a refill on her desipramine (NORPRAMIN) 25mg . She doesn't have any refills and is out of medication.  Jazmin Hartsell,CMA

## 2014-07-02 NOTE — Telephone Encounter (Signed)
Increasing Zyrtec dose will not improve allergy symptoms. [name] may try Singulair, an allergy tablet medication, along with continuing her Zyrtec. [facility] Dr Luismiguel Lamere to send in Rx for Singulair.

## 2014-07-02 NOTE — Telephone Encounter (Signed)
Pt is aware of singulair prescription.  She states that she was started on this medication by you and that your name is on her bottle.  Please advise. Jazmin Hartsell,CMA

## 2014-07-11 ENCOUNTER — Ambulatory Visit (INDEPENDENT_AMBULATORY_CARE_PROVIDER_SITE_OTHER): Payer: Medicaid Other | Admitting: Family Medicine

## 2014-07-11 ENCOUNTER — Encounter: Payer: Self-pay | Admitting: Family Medicine

## 2014-07-11 VITALS — BP 105/60 | HR 106 | Temp 98.2°F | Ht 66.0 in | Wt 195.0 lb

## 2014-07-11 DIAGNOSIS — N870 Mild cervical dysplasia: Secondary | ICD-10-CM

## 2014-07-11 DIAGNOSIS — E669 Obesity, unspecified: Secondary | ICD-10-CM | POA: Diagnosis not present

## 2014-07-11 DIAGNOSIS — M7918 Myalgia, other site: Secondary | ICD-10-CM

## 2014-07-11 DIAGNOSIS — M791 Myalgia: Secondary | ICD-10-CM | POA: Diagnosis not present

## 2014-07-11 DIAGNOSIS — J4520 Mild intermittent asthma, uncomplicated: Secondary | ICD-10-CM

## 2014-07-11 DIAGNOSIS — I10 Essential (primary) hypertension: Secondary | ICD-10-CM | POA: Diagnosis not present

## 2014-07-11 MED ORDER — BACLOFEN 10 MG PO TABS: 10.0000 mg | ORAL_TABLET | Freq: Three times a day (TID) | ORAL | Status: DC | PRN

## 2014-07-11 NOTE — Patient Instructions (Signed)
Stop the Cyclobenzaprine. Start Baclofen muscle relaxant to help with your back pain.  Do not take with cyclobenzaprine.   Lets perform your Pap smear on your next visit.

## 2014-07-12 ENCOUNTER — Encounter: Payer: Self-pay | Admitting: Family Medicine

## 2014-07-12 NOTE — Assessment & Plan Note (Addendum)
BMI 31%.  Wt Readings from Last 3 Encounters:  07/11/14 195 lb (88.451 kg)  06/25/14 194 lb 1 oz (88.026 kg)  04/10/14 193 lb (87.544 kg)   Recently [facility] stopped patient's Invega secondary to patient's complaint about her wieght gain and changed to neuroleptic with less weight gain, Geodon.   Will discuss whether [name] would want a referral to nutritionist to improve her food choices.

## 2014-07-12 NOTE — Assessment & Plan Note (Signed)
Recovered from recent exacerbation. Currently symptoms are under control with just rescue MDI No change in therapy for now.

## 2014-07-12 NOTE — Assessment & Plan Note (Signed)
Repeated low SBP today. Will hold Amlodipine and recheck in three weeks.

## 2014-07-12 NOTE — Assessment & Plan Note (Signed)
Plan for screening Pap next visit Last Pap 2012 normal and adequate. (+) sexually active, uncertain if female partner is monogamous

## 2014-07-12 NOTE — Progress Notes (Signed)
Subjective:    Patient ID: [name], female    DOB: 30-Jun-1980, 34 y.o.   MRN: [id number]  HPI  Problem List Items Addressed This Visit      Cardiovascular and Mediastinum   HYPERTENSION, BENIGN ESSENTIAL - Primary (Chronic) CHRONIC HYPERTENSION  Disease Monitoring  Blood pressure range: not checking at homw  Chest pain: no   Dyspnea: no   Claudication: no   Medication compliance: yes  Medication Side Effects  Lightheadedness: no   Urinary frequency: no   Edema: no    Preventitive Healthcare:  Exercise: no   Diet Pattern: high simple Carbohydrate  Salt Restriction: no       Respiratory   Asthma (Chronic) Pediatric Asthma Therapy Assessment 1. Which Seasons and what extent patient has asthma symptoms Winter     None ( ) A little (x )   A lot ( ) Spring   None ( ) A little (x )   A lot ( ) Summer None (x ) A little ( )   A lot ( ) Fall  None (x ) A little ( )   A lot ( )   2. In past 4 weeks, how many days did patient... Wheezing or difficulty breathing  None ( ) 1 to 3 ( ) 4 to 7 (x )  Over 7 ( ) Wake up wheezing or difficulty breathing None (x ) 1 to 3 ( ) 4 to 7 ( )  Over 7 ( ) Unable to perform roles because of asthma  None ( x) 1 to 3 ( ) 4 to 7 ( )  Over 7 ( )  3. Satisfaction with  current Asthma Treatment Yes (x )  No ( )  Unsure ( )    4. Observed precipitants include cold air, infection and pollens.  5. Frequency of use of quick-relief meds: 2 times a week since last exacerbation from abut 10 days ago. The patient reports adherence to this regimen      Genitourinary   History of DYSPLASIA, CERVIX, MILD - No vaginal or posticoital bleeding - Last pap 10/2010 was adequate and normal - Monogamous relation with husband, uncertain if it is mutual.     Other   Right buttock pain, Chronic - Onset in early 2015 - No longer responding to Flexeril and APAP. No recalled trauma.  - Xray 2007 showed DDD at L5/S1 - Patient recentlty [facility] - Tender point injections in area have not provided relief - Not interfering with usual activities and roles.  - Radiation down to posterior lateral thigh to above knee. No change in loacation - presnet more than half the days.  No known precipitant.      (+) Tobacco   Review of Systems  No fever No unintentional weight loss     Objective:   Physical Exam  VS reviewed GEN: Alert, Cooperative, Groomed, NAD, abdominal obesity COR: RRR, No M/G/R, No JVD, Normal PMI size and location LUNGS: BCTA, No Acc mm use, speaking in full sentences EXT: No peripheral leg edema. SKIN: No lesion nor rashes of face/trunk/extremities Back Exam: Inspection: Motion:   XSLR seated:      neg                  XSLR lying:  neg Seated HS Flexibility: tight hamstrings bilaterally Palpable tenderness: no Sensory change: no Reflex change: no  Strength at foot Plantar-flexion: 5 / 5  Dorsi-flexion:  5/ 5    Eversion:5  / 5   Inversion:5  / 5 Leg strength Quad: 5 / 5   Hamstring: 5 / 5   Hip flexor: 5 / 5   Hip abductors: 5 / 5 Gait         Gait: Normal speed, No significant path deviation, Step through +,  Psych: Normal affect/thought/speech/language         Assessment & Plan:

## 2014-07-12 NOTE — Assessment & Plan Note (Signed)
Chronic issue with recent worsening without signs of radiculopathy or red flags for serious origin of pain.  Pain locatizes to upper outer right iliac fossa in region of gluteus minimus and medius. Would likely benefit from isotonic stretching of gluts and Gluteal strain exercises but patient has not been adherent to exercise program Will switch Flexeril to Baclofen 5 mg THREE TIMES DAILY as needed trial  RTC 3 weeks.

## 2014-07-18 ENCOUNTER — Telehealth: Payer: Self-pay | Admitting: Family Medicine

## 2014-07-18 NOTE — Telephone Encounter (Signed)
Pt called and said that she had her tubes removed in 2014 but she going through a bleeding period and doesn't know why. She would like to talk to Dr. Perley JainMcDiarmid. jw

## 2014-07-18 NOTE — Telephone Encounter (Signed)
Recommend pt come in to be evaluated at [facility]

## 2014-07-18 NOTE — Telephone Encounter (Signed)
Pt states that she has been having dark spotting and breast tenderness for the past 2 months.  She hasn't had any vaginal bleeding since her tubal in 2014.  Patient states that she has only had breast tenderness like this when she was pregnant.  Please advise on what patient should do.  Does she need an appt to come in or does she need [facility]?   [facility] Hospital[facility]azmin Hartsell,CMA

## 2014-07-19 ENCOUNTER — Encounter: Payer: Self-pay | Admitting: Family Medicine

## 2014-07-19 ENCOUNTER — Ambulatory Visit (INDEPENDENT_AMBULATORY_CARE_PROVIDER_SITE_OTHER): Payer: Medicaid Other | Admitting: Family Medicine

## 2014-07-19 VITALS — BP 111/78 | HR 103 | Temp 98.1°F | Ht 66.0 in | Wt 191.0 lb

## 2014-07-19 DIAGNOSIS — N926 Irregular menstruation, unspecified: Secondary | ICD-10-CM | POA: Diagnosis not present

## 2014-07-19 HISTORY — DX: Irregular menstruation, unspecified: N92.6

## 2014-07-19 LAB — CBC WITH DIFFERENTIAL/PLATELET
BASOS ABS: 0 10*3/uL (ref 0.0–0.1)
BASOS PCT: 0 % (ref 0–1)
Eosinophils Absolute: 0.1 10*3/uL (ref 0.0–0.7)
Eosinophils Relative: 1 % (ref 0–5)
HCT: 42.9 % (ref 36.0–46.0)
Hemoglobin: 14.5 g/dL (ref 12.0–15.0)
Lymphocytes Relative: 51 % — ABNORMAL HIGH (ref 12–46)
Lymphs Abs: 3.3 10*3/uL (ref 0.7–4.0)
MCH: 29.7 pg (ref 26.0–34.0)
MCHC: 33.8 g/dL (ref 30.0–36.0)
MCV: 87.9 fL (ref 78.0–100.0)
MONOS PCT: 6 % (ref 3–12)
MPV: 9.4 fL (ref 8.6–12.4)
Monocytes Absolute: 0.4 10*3/uL (ref 0.1–1.0)
NEUTROS PCT: 42 % — AB (ref 43–77)
Neutro Abs: 2.7 10*3/uL (ref 1.7–7.7)
PLATELETS: 321 10*3/uL (ref 150–400)
RBC: 4.88 MIL/uL (ref 3.87–5.11)
RDW: 14.2 % (ref 11.5–15.5)
WBC: 6.4 10*3/uL (ref 4.0–10.5)

## 2014-07-19 LAB — POCT URINE PREGNANCY: Preg Test, Ur: NEGATIVE

## 2014-07-19 MED ORDER — MEDROXYPROGESTERONE ACETATE 10 MG PO TABS
10.0000 mg | ORAL_TABLET | Freq: Every day | ORAL | Status: DC
Start: 2014-07-19 — End: 2014-08-08

## 2014-07-19 NOTE — Telephone Encounter (Signed)
Pt has an appt today at 2pm with Dr. Claiborne BillingsKuneff. Corin Tilly,CMA

## 2014-07-19 NOTE — Assessment & Plan Note (Addendum)
Patient with amenorrhea for 2 years, with now irregular bleeding 2 for the past 30 days, with severe cramping and breast tenderness. She has not had a menstrual cycle since her bilateral salpingectomy in 2014. Other pregnancy unlikely considering salpingectomy will get urine today before providing treatment. Urine pregnancy today negative>> prescription Provera 10 mg for 10 days, discussed withdrawal bleed should occur within 5-7 days after discontinuation of medication. Patient is to follow-up in 3-4 weeks, to discuss if she had a withdrawal bleed, if her bleeding pattern was normal. At that time we can consider whether or not we need a ultrasound if she did not have a good withdrawal bleed, or another round of progesterone. Would perform pelvic exam on follow-up appointment if normal menstrual cycle isn't produced. Could perform Pap smear at that time as well, patient's last Pap was in 2012 and normal. CBC and TSH collected today. 3-4 weeks follow-up

## 2014-07-19 NOTE — Patient Instructions (Addendum)
I'm starting you on a medication called Provera, you will take one pill once a day for 10 days. You should see a normal menstrual cycle within 5 days after stopping the medication. I will need to see you in 3-4 weeks to make sure you had a normal menstrual period, and then we can make a plan from there as to what we need to do if you did not have a normal menstrual period. I am also going to collect some labs on you today, checking her thyroid and her blood count, I will send results to your home if these are normal, if they are abnormal you receive a phone call from us.

## 2014-07-19 NOTE — Progress Notes (Signed)
I was preceptor the day of this visit.   
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
## 2014-07-20 LAB — TSH: TSH: 0.825 u[IU]/mL (ref 0.350–4.500)

## 2014-07-22 ENCOUNTER — Encounter: Payer: Self-pay | Admitting: Family Medicine

## 2014-08-08 ENCOUNTER — Ambulatory Visit (INDEPENDENT_AMBULATORY_CARE_PROVIDER_SITE_OTHER): Payer: Medicaid Other | Admitting: Family Medicine

## 2014-08-08 ENCOUNTER — Encounter: Payer: Self-pay | Admitting: Family Medicine

## 2014-08-08 ENCOUNTER — Other Ambulatory Visit (HOSPITAL_COMMUNITY)
Admission: RE | Admit: 2014-08-08 | Discharge: 2014-08-08 | Disposition: A | Discharge status: Home / Self Care | Payer: Medicaid Other | Source: Ambulatory Visit | Attending: Family Medicine | Admitting: Family Medicine

## 2014-08-08 VITALS — BP 116/82 | HR 73 | Temp 98.2°F | Ht 66.0 in | Wt 194.0 lb

## 2014-08-08 DIAGNOSIS — Z79899 Other long term (current) drug therapy: Secondary | ICD-10-CM

## 2014-08-08 DIAGNOSIS — B977 Papillomavirus as the cause of diseases classified elsewhere: Secondary | ICD-10-CM | POA: Insufficient documentation

## 2014-08-08 DIAGNOSIS — N870 Mild cervical dysplasia: Secondary | ICD-10-CM | POA: Diagnosis not present

## 2014-08-08 DIAGNOSIS — R739 Hyperglycemia, unspecified: Secondary | ICD-10-CM | POA: Diagnosis not present

## 2014-08-08 DIAGNOSIS — Z202 Contact with and (suspected) exposure to infections with a predominantly sexual mode of transmission: Secondary | ICD-10-CM | POA: Diagnosis not present

## 2014-08-08 DIAGNOSIS — I10 Essential (primary) hypertension: Secondary | ICD-10-CM | POA: Diagnosis not present

## 2014-08-08 DIAGNOSIS — Z113 Encounter for screening for infections with a predominantly sexual mode of transmission: Secondary | ICD-10-CM | POA: Diagnosis present

## 2014-08-08 DIAGNOSIS — Z01419 Encounter for gynecological examination (general) (routine) without abnormal findings: Secondary | ICD-10-CM | POA: Diagnosis not present

## 2014-08-08 DIAGNOSIS — N72 Inflammatory disease of cervix uteri: Secondary | ICD-10-CM

## 2014-08-08 DIAGNOSIS — Z1151 Encounter for screening for human papillomavirus (HPV): Secondary | ICD-10-CM | POA: Insufficient documentation

## 2014-08-08 HISTORY — DX: Papillomavirus as the cause of diseases classified elsewhere: B97.7

## 2014-08-08 LAB — LIPID PANEL
CHOL/HDL RATIO: 4.3 ratio
Cholesterol: 167 mg/dL (ref 0–200)
HDL: 39 mg/dL — ABNORMAL LOW (ref >=46)
LDL CALC: 110 mg/dL — AB (ref 0–99)
Triglycerides: 91 mg/dL (ref <150)
VLDL: 18 mg/dL (ref 0–40)

## 2014-08-08 LAB — POCT GLYCOSYLATED HEMOGLOBIN (HGB A1C): HEMOGLOBIN A1C: 5.4

## 2014-08-08 NOTE — Patient Instructions (Addendum)
We are checking your Cholesterol and Blood Sugar today.    Dr Bobbie Valletta will call you if your tests are not good. Otherwise he will send you a letter.  If you sign up for MyChart online, you will be able to see your test results once Dr Kashana Breach has reviewed them.  If you do not hear from us with in 2 weeks please call our office  I recommend contacting Dr Gerilyn PilgrimSykes (Nutrition) to discuss healthy eating and activity.   I believe you have a viral upper respiratory tract infection that should start to get better in next 3 to 5 days.  If you do not get better or if you start to get worse, please contact our office for evaluation.   Upper Respiratory Infection, Adult An upper respiratory infection (URI) is also sometimes known as the common cold. The upper respiratory tract includes the nose, sinuses, throat, trachea, and bronchi. Bronchi are the airways leading to the lungs. Most people improve within 1 week, but symptoms can last up to 2 weeks. A residual cough may last even longer.  CAUSES Many different viruses can infect the tissues lining the upper respiratory tract. The tissues become irritated and inflamed and often become very moist. Mucus production is also common. A cold is contagious. You can easily spread the virus to others by oral contact. This includes kissing, sharing a glass, coughing, or sneezing. Touching your mouth or nose and then touching a surface, which is then touched by another person, can also spread the virus. SYMPTOMS  Symptoms typically develop 1 to 3 days after you come in contact with a cold virus. Symptoms vary from person to person. They may include:  Runny nose.  Sneezing.  Nasal congestion.  Sinus irritation.  Sore throat.  Loss of voice (laryngitis).  Cough.  Fatigue.  Muscle aches.  Loss of appetite.  Headache.  Low-grade fever. DIAGNOSIS  You might diagnose your own cold based on familiar symptoms, since most people get a cold 2 to 3 times a  year. Your caregiver can confirm this based on your exam. Most importantly, your caregiver can check that your symptoms are not due to another disease such as strep throat, sinusitis, pneumonia, asthma, or epiglottitis. Blood tests, throat tests, and X-rays are not necessary to diagnose a common cold, but they may sometimes be helpful in excluding other more serious diseases. Your caregiver will decide if any further tests are required. RISKS AND COMPLICATIONS  You may be at risk for a more severe case of the common cold if you smoke cigarettes, have chronic heart disease (such as heart failure) or lung disease (such as asthma), or if you have a weakened immune system. The very young and very old are also at risk for more serious infections. Bacterial sinusitis, middle ear infections, and bacterial pneumonia can complicate the common cold. The common cold can worsen asthma and chronic obstructive pulmonary disease (COPD). Sometimes, these complications can require emergency medical care and may be life-threatening. PREVENTION  The best way to protect against getting a cold is to practice good hygiene. Avoid oral or hand contact with people with cold symptoms. Wash your hands often if contact occurs. There is no clear evidence that vitamin C, vitamin E, echinacea, or exercise reduces the chance of developing a cold. However, it is always recommended to get plenty of rest and practice good nutrition. TREATMENT  Treatment is directed at relieving symptoms. There is no cure. Antibiotics are not effective, because the infection is  caused by a virus, not by bacteria. Treatment may include:  Increased fluid intake. Sports drinks offer valuable electrolytes, sugars, and fluids.  Breathing heated mist or steam (vaporizer or shower).  Eating chicken soup or other clear broths, and maintaining good nutrition.  Getting plenty of rest.  Using gargles or lozenges for comfort.  Controlling fevers with ibuprofen  or acetaminophen as directed by your caregiver.  Increasing usage of your inhaler if you have asthma. Zinc gel and zinc lozenges, taken in the first 24 hours of the common cold, can shorten the duration and lessen the severity of symptoms. Pain medicines may help with fever, muscle aches, and throat pain. A variety of non-prescription medicines are available to treat congestion and runny nose. Your caregiver can make recommendations and may suggest nasal or lung inhalers for other symptoms.  HOME CARE INSTRUCTIONS   Only take over-the-counter or prescription medicines for pain, discomfort, or fever as directed by your caregiver.  Use a warm mist humidifier or inhale steam from a shower to increase air moisture. This may keep secretions moist and make it easier to breathe.  Drink enough water and fluids to keep your urine clear or pale yellow.  Rest as needed.  Return to work when your temperature has returned to normal or as your caregiver advises. You may need to stay home longer to avoid infecting others. You can also use a face mask and careful hand washing to prevent spread of the virus. SEEK MEDICAL CARE IF:   After the first few days, you feel you are getting worse rather than better.  You need your caregiver's advice about medicines to control symptoms.  You develop chills, worsening shortness of breath, or Loll or red sputum. These may be signs of pneumonia.  You develop yellow or Dartt nasal discharge or pain in the face, especially when you bend forward. These may be signs of sinusitis.  You develop a fever, swollen neck glands, pain with swallowing, or white areas in the back of your throat. These may be signs of strep throat. SEEK IMMEDIATE MEDICAL CARE IF:   You have a fever.  You develop severe or persistent headache, ear pain, sinus pain, or chest pain.  You develop wheezing, a prolonged cough, cough up blood, or have a change in your usual mucus (if you have chronic  lung disease).  You develop sore muscles or a stiff neck. Document Released: 08/25/2000 Document Revised: 05/24/2011 Document Reviewed: 06/06/2013 [facility] Patient Information 2015 [employer] [place]. This information is not intended to replace advice given to you by your health care provider. Make sure you discuss any questions you have with your health care provider.

## 2014-08-09 ENCOUNTER — Encounter: Payer: Self-pay | Admitting: Family Medicine

## 2014-08-09 NOTE — Assessment & Plan Note (Signed)
Adequate BP control off Amlodipine but now on Prazosin (unknown dose) at bedtime by [facility]. Tolerating medication for sleep disorder.  No new end-organ damage.  Continue current medications.

## 2014-08-09 NOTE — Assessment & Plan Note (Addendum)
Unremarkable GYN exam Pap sent for cytology and GC/Chlamydia (high risk exposure to potentially nonmonogamous partner

## 2014-08-09 NOTE — Progress Notes (Signed)
   Subjective:    Patient ID: [name], female    DOB: 01/26/1981, 34 y.o.   MRN: [id number]  Hypertension    Problem List Items Addressed This Visit      Cardiovascular and Mediastinum   HYPERTENSION, BENIGN ESSENTIAL - Primary (Chronic)  Disease Monitoring  Blood pressure range: not checking at home  Chest pain: no   Dyspnea: no   Claudication: no   Medication compliance: stopped Amlodipine as instructed. Started on Prazosin at night to help with sleep and nightmares  Medication Side Effects  Lightheadedness: no   Urinary frequency: no   Edema: no    Preventitive Healthcare:  Exercise: no   Diet Pattern: high simple Carbohydrate  Salt Restriction: no     Genitourinary   History of DYSPLASIA, CERVIX, MILD - No vaginal or posticoital bleeding - Last pap 10/2010 was adequate and normal - Monogamous relation with husband, uncertain if it is mutual. - No vaginal discharge, No urinary incontinence.  - S/P bilateral salpingectomies 2014 for contraception -         (+) Tobacco   Review of Systems  No fever No unintentional weight loss     Objective:   Physical Exam  VS reviewed GEN: Alert, Cooperative, Groomed, NAD, abdominal obesity COR: RRR, No M/G/R, No JVD, Normal PMI size and location LUNGS: BCTA, No Acc mm use, speaking in full sentences EXT: No peripheral leg edema. SKIN: No new lesion nor rashes of neck or arms Genitalia:  Normal introitus for age, no external lesions, no vaginal discharge, mucosa pink and moist, no vaginal or cervical lesions, no vaginal atrophy, no friaility or hemorrhage, normal uterus size and position, post-parous cervix without lesions.  no adnexal masses or tenderness Half Speculum exam show no anterior vagianl wall descent nor posterior vaginal wall ascent with valsalva         Gait: Normal speed, No significant path deviation, Step through +,  Psych: Normal affect/thought/speech/language         Assessment &  Plan:

## 2014-08-13 ENCOUNTER — Encounter: Payer: Self-pay | Admitting: Family Medicine

## 2014-08-14 LAB — CYTOLOGY - PAP

## 2014-08-22 ENCOUNTER — Ambulatory Visit: Payer: Medicaid Other | Admitting: Family Medicine

## 2014-09-30 ENCOUNTER — Encounter: Payer: Self-pay | Admitting: Family Medicine
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
## 2014-10-29 ENCOUNTER — Ambulatory Visit: Payer: Medicaid Other

## 2014-11-23 ENCOUNTER — Other Ambulatory Visit: Payer: Self-pay | Admitting: Family Medicine

## 2014-12-10 ENCOUNTER — Ambulatory Visit (INDEPENDENT_AMBULATORY_CARE_PROVIDER_SITE_OTHER): Payer: Medicaid Other | Admitting: *Deleted

## 2014-12-10 DIAGNOSIS — Z23 Encounter for immunization: Secondary | ICD-10-CM

## 2014-12-10 DIAGNOSIS — M549 Dorsalgia, unspecified: Secondary | ICD-10-CM

## 2014-12-10 MED ORDER — KETOROLAC TROMETHAMINE 60 MG/2ML IM SOLN
60.0000 mg | Freq: Once | INTRAMUSCULAR | Status: AC
  Administered 2014-12-10: 60 mg via INTRAMUSCULAR

## 2014-12-10 NOTE — Progress Notes (Signed)
Patient here today for Toradol injection for left back pain.  Rates pain at 8.5-9 on scale of 1-10.  Per nurse clinic note on 05/24/14--Patient can receive Toradol injection once a week per Dr. Perley Jain.  Last received 60 mg IM on 05/24/14.  Dr. McDiarmid not available and discussed with preceptor (Dr. Mauricio Po).  Ok to give Toradol 60 mg IM x 1 today.  Will route note to Dr. McDiarmid to enter Toradol in as standing order for future injections.  Toradol 60 mg IM given in [facility] by Grant Fontana, [employer] [facility] nursing student.  Site unremarkable and patient tolerated injection.  States she will call back later this week if pain is not better.  Altamese Dilling, BSN, RN-BC

## 2014-12-16 ENCOUNTER — Other Ambulatory Visit: Payer: Self-pay | Admitting: Family Medicine

## 2014-12-18 ENCOUNTER — Encounter: Payer: Self-pay | Admitting: Family Medicine

## 2014-12-18 ENCOUNTER — Ambulatory Visit (INDEPENDENT_AMBULATORY_CARE_PROVIDER_SITE_OTHER): Payer: Medicaid Other | Admitting: Family Medicine

## 2014-12-18 ENCOUNTER — Telehealth: Payer: Self-pay | Admitting: Family Medicine

## 2014-12-18 VITALS — BP 138/100 | HR 75 | Temp 98.0°F | Ht 66.0 in | Wt 184.5 lb

## 2014-12-18 DIAGNOSIS — M5432 Sciatica, left side: Secondary | ICD-10-CM | POA: Diagnosis not present

## 2014-12-18 DIAGNOSIS — G5702 Lesion of sciatic nerve, left lower limb: Secondary | ICD-10-CM

## 2014-12-18 MED ORDER — ACETAMINOPHEN-CODEINE #3 300-30 MG PO TABS: 1.0000 | ORAL_TABLET | ORAL | Status: DC | PRN

## 2014-12-18 MED ORDER — KETOROLAC TROMETHAMINE 30 MG/ML IJ SOLN
30.0000 mg | Freq: Once | INTRAMUSCULAR | Status: AC
  Administered 2014-12-18: 30 mg via INTRAMUSCULAR

## 2014-12-18 NOTE — Progress Notes (Signed)
    Subjective: CC: chronic pain HPI: Patient is a 34 y.o. female presenting to clinic today for office visit. Concerns today include:  1. Chronic pain Patient reports osteoarthritis in her shoulder, back and knees.  She also notes that she has had a pinched nerve before, she wonders if this is what this is.  She has not been in severe pain like this in several years.  She was told that she might have fibromyalgia.  Symptoms got worse on Tues.  She was unable to get out of bed.  Patient reports that she has had chronic pain in back.  Patient described pain in her L lower back that is shooting in nature and radiates down the back of her leg to her knee.  She was seen recently for Toradol injection, which did not improve pain.  She notes that she has been using old Vicodin with some relief.  Flexeril, baclofen, tramadol (old) not helping.  She has seen at [facility] and PT for this in the past.  She saw them in 2013 or 2014 for her shoulder.  She did not notice a difference in pain with these therapies.  Patient feels that she is active with her 3 children.  She reports that activity is limited by pain.  She is having to move more slowly and carefully 2/2 pain.  No weakness, numbness, tingling, falls, injury, urinary retention, fecal incontinence.  Social History Reviewed. FamHx and MedHx updated.  Please see EMR. Health Maintenance: Flu shot   ROS: All other systems reviewed and are negative.  Objective: Office vital signs reviewed. BP 138/100 mmHg  Pulse 75  Temp(Src) 98 F (36.7 C) (Oral)  Ht  (1.676 m)  Wt 184 lb 8 oz (83.689 kg)  BMI 29.79 kg/m2  Physical Examination:  General: Awake, alert, well nourished, NAD HEENT: Normal, MMM Cardio: RRR, S1S2 heard, no murmurs appreciated Pulm: CTAB, no wheezes, rhonchi or rales, normal WOB Extremities: WWP, No edema, cyanosis or clubbing; +2 pulses bilaterally MSK: Antalgic gait, patient favors R leg  Spine with normal curvature, no  midline TTP, + paraspinal TTP on L SI region, straight leg test positive, FADIR positive, FABER positive, 5/5 LE strength, 1/4 patellar DTRs b/l, no sensation deficits, AROM limited by pain in flexion of the hip. Neuro: Strength and sensation grossly intact, DTRs 1/4  Assessment/ Plan: 34 y.o. female with  1. Sciatica associated with disorder of lumbar spine, left.  Suspect 2/2 piriformis syndrome.  No red flags on exam.  - Back exercises handout given. - acetaminophen-codeine (TYLENOL #3) 300-30 MG tablet; Take 1-2 tablets by mouth every 4 (four) hours as needed for moderate pain or severe pain.  Dispense: 30 tablet; Refill: 0 - Toradol  injection given IM today. - Ambulatory referral to Sports Medicine ? Steroid injection. - Would consider imaging of back if no improvement or worsening symptoms. - Return precautions reviewed. - Follow up with PCP in 2 weeks   2. Piriformis syndrome of left side - acetaminophen-codeine (TYLENOL #3) 300-30 MG tablet; Take 1-2 tablets by mouth every 4 (four) hours as needed for moderate pain or severe pain.  Dispense: 30 tablet; Refill: 0 - Ambulatory referral to Sports Medicine - ketorolac (TORADOL) 30 MG/ML injection 30 mg; Inject 1 mL (30 mg total) into the muscle once.   Raliegh Ip, DO PGY-2, [facility]

## 2014-12-18 NOTE — Telephone Encounter (Signed)
Pt was seen by dr Nadine Counts today and was told to schedule a 2 week followup with pcp. No appts with McDiarmid until Nov .  Pt mother is being seen 10/22 at 10:30 by McDiarmid. Athough pt was told she could not be double booked with McDiarmid then, she insisted he be asked. Please advise

## 2014-12-18 NOTE — Telephone Encounter (Signed)
Will forward to MD to see if we are able to double book this patient. Jazmin Hartsell,CMA

## 2014-12-18 NOTE — Patient Instructions (Signed)
Please do the exercises I have provided you.  I have given you a short supply of pain medication to help with your symptoms.  A referral to sports medicine has been placed.  You should expect a call from them with an appointment soon.  If you develop weakness, cannot urinate, or defecate on yourself, seek immediate medical attention.  Follow up with Dr McDiarmid in 2 weeks or sooner if needed for your back.  If you are still having pain, he may want to have your back imaged.  Sciatica Sciatica is pain, weakness, numbness, or tingling along the path of the sciatic nerve. The nerve starts in the lower back and runs down the back of each leg. The nerve controls the muscles in the lower leg and in the back of the knee, while also providing sensation to the back of the thigh, lower leg, and the sole of your foot. Sciatica is a symptom of another medical condition. For instance, nerve damage or certain conditions, such as a herniated disk or bone spur on the spine, pinch or put pressure on the sciatic nerve. This causes the pain, weakness, or other sensations normally associated with sciatica. Generally, sciatica only affects one side of the body. CAUSES   Herniated or slipped disc.  Degenerative disk disease.  A pain disorder involving the narrow muscle in the buttocks (piriformis syndrome).  Pelvic injury or fracture.  Pregnancy.  Tumor (rare). SYMPTOMS  Symptoms can vary from mild to very severe. The symptoms usually travel from the low back to the buttocks and down the back of the leg. Symptoms can include:  Mild tingling or dull aches in the lower back, leg, or hip.  Numbness in the back of the calf or sole of the foot.  Burning sensations in the lower back, leg, or hip.  Sharp pains in the lower back, leg, or hip.  Leg weakness.  Severe back pain inhibiting movement. These symptoms may get worse with coughing, sneezing, laughing, or prolonged sitting or standing. Also, being overweight  may worsen symptoms. DIAGNOSIS  Your caregiver will perform a physical exam to look for common symptoms of sciatica. He or she may ask you to do certain movements or activities that would trigger sciatic nerve pain. Other tests may be performed to find the cause of the sciatica. These may include:  Blood tests.  X-rays.  Imaging tests, such as an MRI or CT scan. TREATMENT  Treatment is directed at the cause of the sciatic pain. Sometimes, treatment is not necessary and the pain and discomfort goes away on its own. If treatment is needed, your caregiver may suggest:  Over-the-counter medicines to relieve pain.  Prescription medicines, such as anti-inflammatory medicine, muscle relaxants, or narcotics.  Applying heat or ice to the painful area.  Steroid injections to lessen pain, irritation, and inflammation around the nerve.  Reducing activity during periods of pain.  Exercising and stretching to strengthen your abdomen and improve flexibility of your spine. Your caregiver may suggest losing weight if the extra weight makes the back pain worse.  Physical therapy.  Surgery to eliminate what is pressing or pinching the nerve, such as a bone spur or part of a herniated disk. HOME CARE INSTRUCTIONS   Only take over-the-counter or prescription medicines for pain or discomfort as directed by your caregiver.  Apply ice to the affected area for 20 minutes, 3-4 times a day for the first 48-72 hours. Then try heat in the same way.  Exercise, stretch, or  perform your usual activities if these do not aggravate your pain.  Attend physical therapy sessions as directed by your caregiver.  Keep all follow-up appointments as directed by your caregiver.  Do not wear high heels or shoes that do not provide proper support.  Check your mattress to see if it is too soft. A firm mattress may lessen your pain and discomfort. SEEK IMMEDIATE MEDICAL CARE IF:   You lose control of your bowel or  bladder (incontinence).  You have increasing weakness in the lower back, pelvis, buttocks, or legs.  You have redness or swelling of your back.  You have a burning sensation when you urinate.  You have pain that gets worse when you lie down or awakens you at night.  Your pain is worse than you have experienced in the past.  Your pain is lasting longer than 4 weeks.  You are suddenly losing weight without reason. MAKE SURE YOU:  Understand these instructions.  Will watch your condition.  Will get help right away if you are not doing well or get worse.   This information is not intended to replace advice given to you by your health care provider. Make sure you discuss any questions you have with your health care provider.   Document Released: 02/23/2001 Document Revised: 11/20/2014 Document Reviewed: 07/11/2011 Elsevier Interactive Patient Education [employer]

## 2014-12-19 ENCOUNTER — Other Ambulatory Visit: Payer: Self-pay | Admitting: Family Medicine

## 2014-12-19 DIAGNOSIS — G8929 Other chronic pain: Secondary | ICD-10-CM

## 2014-12-19 DIAGNOSIS — M25511 Pain in right shoulder: Principal | ICD-10-CM

## 2014-12-19 MED ORDER — DICLOFENAC SODIUM 1 % TD GEL: 4.0000 g | Freq: Four times a day (QID) | TRANSDERMAL | Status: DC | PRN

## 2014-12-20 NOTE — Telephone Encounter (Signed)
If Ms Brickhouse needs to be seen again quickly, then she will have to be seen by another blue team provider who has an open slot.

## 2014-12-24 NOTE — Telephone Encounter (Signed)
Spoke with patient and she states that she was just advised to follow up with pcp for her sciatica in 2 weeks.  She did schedule an appt at your available time which is November.  Patient was referred out to sports medicine but per Dr. Margaretha Sheffield he thinks she would benefit better from pain management vs. Sports medicine.  Will forward to MD to see if we can get a referral for pain medicine placed. Alisa Stjames,CMA

## 2014-12-24 NOTE — Telephone Encounter (Signed)
I will need to see [name] before would make a referral to Pain Specialist.

## 2014-12-30 ENCOUNTER — Ambulatory Visit (HOSPITAL_COMMUNITY)
Admission: RE | Admit: 2014-12-30 | Discharge: 2014-12-30 | Disposition: A | Discharge status: Home / Self Care | Payer: Medicaid Other | Source: Ambulatory Visit | Attending: Family Medicine | Admitting: Family Medicine

## 2014-12-30 ENCOUNTER — Ambulatory Visit (INDEPENDENT_AMBULATORY_CARE_PROVIDER_SITE_OTHER): Payer: Medicaid Other | Admitting: Internal Medicine

## 2014-12-30 ENCOUNTER — Encounter: Payer: Self-pay | Admitting: Internal Medicine

## 2014-12-30 VITALS — BP 124/85 | HR 106 | Temp 98.6°F | Ht 66.0 in | Wt 182.0 lb

## 2014-12-30 DIAGNOSIS — M5442 Lumbago with sciatica, left side: Secondary | ICD-10-CM | POA: Diagnosis not present

## 2014-12-30 DIAGNOSIS — M545 Low back pain: Secondary | ICD-10-CM | POA: Insufficient documentation

## 2014-12-30 DIAGNOSIS — M5430 Sciatica, unspecified side: Secondary | ICD-10-CM | POA: Insufficient documentation

## 2014-12-30 MED ORDER — KETOROLAC TROMETHAMINE 30 MG/ML IM SOLN: 30.0000 mg | Freq: Once | INTRAMUSCULAR | Status: DC

## 2014-12-30 MED ORDER — KETOROLAC TROMETHAMINE 30 MG/ML IJ SOLN
30.0000 mg | Freq: Once | INTRAMUSCULAR | Status: AC
  Administered 2014-12-30: 30 mg via INTRAVENOUS

## 2014-12-30 MED ORDER — GABAPENTIN 100 MG PO CAPS: 100.0000 mg | ORAL_CAPSULE | Freq: Three times a day (TID) | ORAL | Status: DC

## 2014-12-30 NOTE — Patient Instructions (Signed)
It was so nice to meet you!  We will send you over to the hospital for an x-ray of your lower back.   I have prescribed a medication called Gabapentin to help with the pain. Please take this three times per day. If this medication is not helping the pain, please call us back and we can discuss increasing the dose.  We will also give you a Toradol shot today since this has helped in the past.  Please try to continue to do the back exercises as much as you can and use heating pads to help with the pain.  Please follow-up with your PCP after your x-ray.  -Dr. Nancy Marus  Sciatica With Rehab The sciatic nerve runs from the back down the leg and is responsible for sensation and control of the muscles in the back (posterior) side of the thigh, lower leg, and foot. Sciatica is a condition that is characterized by inflammation of this nerve.  SYMPTOMS   Signs of nerve damage, including numbness and/or weakness along the posterior side of the lower extremity.  Pain in the back of the thigh that may also travel down the leg.  Pain that worsens when sitting for long periods of time.  Occasionally, pain in the back or buttock. CAUSES  Inflammation of the sciatic nerve is the cause of sciatica. The inflammation is due to something irritating the nerve. Common sources of irritation include:  Sitting for long periods of time.  Direct trauma to the nerve.  Arthritis of the spine.  Herniated or ruptured disk.  Slipping of the vertebrae (spondylolisthesis).  Pressure from soft tissues, such as muscles or ligament-like tissue (fascia). RISK INCREASES WITH:  Sports that place pressure or stress on the spine (football or weightlifting).  Poor strength and flexibility.  Failure to warm up properly before activity.  Family history of low back pain or disk disorders.  Previous back injury or surgery.  Poor body mechanics, especially when lifting, or poor posture. PREVENTION   Warm up and  stretch properly before activity.  Maintain physical fitness:  Strength, flexibility, and endurance.  Cardiovascular fitness.  Learn and use proper technique, especially with posture and lifting. When possible, have coach correct improper technique.  Avoid activities that place stress on the spine. PROGNOSIS If treated properly, then sciatica usually resolves within 6 weeks. However, occasionally surgery is necessary.  RELATED COMPLICATIONS   Permanent nerve damage, including pain, numbness, tingle, or weakness.  Chronic back pain.  Risks of surgery: infection, bleeding, nerve damage, or damage to surrounding tissues. TREATMENT Treatment initially involves resting from any activities that aggravate your symptoms. The use of ice and medication may help reduce pain and inflammation. The use of strengthening and stretching exercises may help reduce pain with activity. These exercises may be performed at home or with referral to a therapist. A therapist may recommend further treatments, such as transcutaneous electronic nerve stimulation (TENS) or ultrasound. Your caregiver may recommend corticosteroid injections to help reduce inflammation of the sciatic nerve. If symptoms persist despite non-surgical (conservative) treatment, then surgery may be recommended. MEDICATION  If pain medication is necessary, then nonsteroidal anti-inflammatory medications, such as aspirin and ibuprofen, or other minor pain relievers, such as acetaminophen, are often recommended.  Do not take pain medication for 7 days before surgery.  Prescription pain relievers may be given if deemed necessary by your caregiver. Use only as directed and only as much as you need.  Ointments applied to the skin may be helpful.  Corticosteroid injections may be given by your caregiver. These injections should be reserved for the most serious cases, because they may only be given a certain number of times. HEAT AND COLD  Cold  treatment (icing) relieves pain and reduces inflammation. Cold treatment should be applied for 10 to 15 minutes every 2 to 3 hours for inflammation and pain and immediately after any activity that aggravates your symptoms. Use ice packs or massage the area with a piece of ice (ice massage).  Heat treatment may be used prior to performing the stretching and strengthening activities prescribed by your caregiver, physical therapist, or athletic trainer. Use a heat pack or soak the injury in warm water. SEEK MEDICAL CARE IF:  Treatment seems to offer no benefit, or the condition worsens.  Any medications produce adverse side effects. EXERCISES  RANGE OF MOTION (ROM) AND STRETCHING EXERCISES - Sciatica Most people with sciatic will find that their symptoms worsen with either excessive bending forward (flexion) or arching at the low back (extension). The exercises which will help resolve your symptoms will focus on the opposite motion. Your physician, physical therapist or athletic trainer will help you determine which exercises will be most helpful to resolve your low back pain. Do not complete any exercises without first consulting with your clinician. Discontinue any exercises which worsen your symptoms until you speak to your clinician. If you have pain, numbness or tingling which travels down into your buttocks, leg or foot, the goal of the therapy is for these symptoms to move closer to your back and eventually resolve. Occasionally, these leg symptoms will get better, but your low back pain may worsen; this is typically an indication of progress in your rehabilitation. Be certain to be very alert to any changes in your symptoms and the activities in which you participated in the 24 hours prior to the change. Sharing this information with your clinician will allow him/her to most efficiently treat your condition. These exercises may help you when beginning to rehabilitate your injury. Your symptoms may  resolve with or without further involvement from your physician, physical therapist or athletic trainer. While completing these exercises, remember:   Restoring tissue flexibility helps normal motion to return to the joints. This allows healthier, less painful movement and activity.  An effective stretch should be held for at least 30 seconds.  A stretch should never be painful. You should only feel a gentle lengthening or release in the stretched tissue. FLEXION RANGE OF MOTION AND STRETCHING EXERCISES: STRETCH - Flexion, Single Knee to Chest   Lie on a firm bed or floor with both legs extended in front of you.  Keeping one leg in contact with the floor, bring your opposite knee to your chest. Hold your leg in place by either grabbing behind your thigh or at your knee.  Pull until you feel a gentle stretch in your low back. Hold __________ seconds.  Slowly release your grasp and repeat the exercise with the opposite side. Repeat __________ times. Complete this exercise __________ times per day.  STRETCH - Flexion, Double Knee to Chest  Lie on a firm bed or floor with both legs extended in front of you.  Keeping one leg in contact with the floor, bring your opposite knee to your chest.  Tense your stomach muscles to support your back and then lift your other knee to your chest. Hold your legs in place by either grabbing behind your thighs or at your knees.  Pull both knees toward  your chest until you feel a gentle stretch in your low back. Hold __________ seconds.  Tense your stomach muscles and slowly return one leg at a time to the floor. Repeat __________ times. Complete this exercise __________ times per day.  STRETCH - Low Trunk Rotation   Lie on a firm bed or floor. Keeping your legs in front of you, bend your knees so they are both pointed toward the ceiling and your feet are flat on the floor.  Extend your arms out to the side. This will stabilize your upper body by keeping  your shoulders in contact with the floor.  Gently and slowly drop both knees together to one side until you feel a gentle stretch in your low back. Hold for __________ seconds.  Tense your stomach muscles to support your low back as you bring your knees back to the starting position. Repeat the exercise to the other side. Repeat __________ times. Complete this exercise __________ times per day  EXTENSION RANGE OF MOTION AND FLEXIBILITY EXERCISES: STRETCH - Extension, Prone on Elbows  Lie on your stomach on the floor, a bed will be too soft. Place your palms about shoulder width apart and at the height of your head.  Place your elbows under your shoulders. If this is too painful, stack pillows under your chest.  Allow your body to relax so that your hips drop lower and make contact more completely with the floor.  Hold this position for __________ seconds.  Slowly return to lying flat on the floor. Repeat __________ times. Complete this exercise __________ times per day.  RANGE OF MOTION - Extension, Prone Press Ups  Lie on your stomach on the floor, a bed will be too soft. Place your palms about shoulder width apart and at the height of your head.  Keeping your back as relaxed as possible, slowly straighten your elbows while keeping your hips on the floor. You may adjust the placement of your hands to maximize your comfort. As you gain motion, your hands will come more underneath your shoulders.  Hold this position __________ seconds.  Slowly return to lying flat on the floor. Repeat __________ times. Complete this exercise __________ times per day.  STRENGTHENING EXERCISES - Sciatica  These exercises may help you when beginning to rehabilitate your injury. These exercises should be done near your "sweet spot." This is the neutral, low-back arch, somewhere between fully rounded and fully arched, that is your least painful position. When performed in this safe range of motion, these  exercises can be used for people who have either a flexion or extension based injury. These exercises may resolve your symptoms with or without further involvement from your physician, physical therapist or athletic trainer. While completing these exercises, remember:   Muscles can gain both the endurance and the strength needed for everyday activities through controlled exercises.  Complete these exercises as instructed by your physician, physical therapist or athletic trainer. Progress with the resistance and repetition exercises only as your caregiver advises.  You may experience muscle soreness or fatigue, but the pain or discomfort you are trying to eliminate should never worsen during these exercises. If this pain does worsen, stop and make certain you are following the directions exactly. If the pain is still present after adjustments, discontinue the exercise until you can discuss the trouble with your clinician. STRENGTHENING - Deep Abdominals, Pelvic Tilt   Lie on a firm bed or floor. Keeping your legs in front of you, bend your knees  so they are both pointed toward the ceiling and your feet are flat on the floor.  Tense your lower abdominal muscles to press your low back into the floor. This motion will rotate your pelvis so that your tail bone is scooping upwards rather than pointing at your feet or into the floor.  With a gentle tension and even breathing, hold this position for __________ seconds. Repeat __________ times. Complete this exercise __________ times per day.  STRENGTHENING - Abdominals, Crunches   Lie on a firm bed or floor. Keeping your legs in front of you, bend your knees so they are both pointed toward the ceiling and your feet are flat on the floor. Cross your arms over your chest.  Slightly tip your chin down without bending your neck.  Tense your abdominals and slowly lift your trunk high enough to just clear your shoulder blades. Lifting higher can put excessive  stress on the low back and does not further strengthen your abdominal muscles.  Control your return to the starting position. Repeat __________ times. Complete this exercise __________ times per day.  STRENGTHENING - Quadruped, Opposite UE/LE Lift  Assume a hands and knees position on a firm surface. Keep your hands under your shoulders and your knees under your hips. You may place padding under your knees for comfort.  Find your neutral spine and gently tense your abdominal muscles so that you can maintain this position. Your shoulders and hips should form a rectangle that is parallel with the floor and is not twisted.  Keeping your trunk steady, lift your right hand no higher than your shoulder and then your left leg no higher than your hip. Make sure you are not holding your breath. Hold this position __________ seconds.  Continuing to keep your abdominal muscles tense and your back steady, slowly return to your starting position. Repeat with the opposite arm and leg. Repeat __________ times. Complete this exercise __________ times per day.  STRENGTHENING - Abdominals and Quadriceps, Straight Leg Raise   Lie on a firm bed or floor with both legs extended in front of you.  Keeping one leg in contact with the floor, bend the other knee so that your foot can rest flat on the floor.  Find your neutral spine, and tense your abdominal muscles to maintain your spinal position throughout the exercise.  Slowly lift your straight leg off the floor about 6 inches for a count of 15, making sure to not hold your breath.  Still keeping your neutral spine, slowly lower your leg all the way to the floor. Repeat this exercise with each leg __________ times. Complete this exercise __________ times per day. POSTURE AND BODY MECHANICS CONSIDERATIONS - Sciatica Keeping correct posture when sitting, standing or completing your activities will reduce the stress put on different body tissues, allowing injured  tissues a chance to heal and limiting painful experiences. The following are general guidelines for improved posture. Your physician or physical therapist will provide you with any instructions specific to your needs. While reading these guidelines, remember:  The exercises prescribed by your provider will help you have the flexibility and strength to maintain correct postures.  The correct posture provides the optimal environment for your joints to work. All of your joints have less wear and tear when properly supported by a spine with good posture. This means you will experience a healthier, less painful body.  Correct posture must be practiced with all of your activities, especially prolonged sitting and standing. Correct  posture is as important when doing repetitive low-stress activities (typing) as it is when doing a single heavy-load activity (lifting). RESTING POSITIONS Consider which positions are most painful for you when choosing a resting position. If you have pain with flexion-based activities (sitting, bending, stooping, squatting), choose a position that allows you to rest in a less flexed posture. You would want to avoid curling into a fetal position on your side. If your pain worsens with extension-based activities (prolonged standing, working overhead), avoid resting in an extended position such as sleeping on your stomach. Most people will find more comfort when they rest with their spine in a more neutral position, neither too rounded nor too arched. Lying on a non-sagging bed on your side with a pillow between your knees, or on your back with a pillow under your knees will often provide some relief. Keep in mind, being in any one position for a prolonged period of time, no matter how correct your posture, can still lead to stiffness. PROPER SITTING POSTURE In order to minimize stress and discomfort on your spine, you must sit with correct posture Sitting with good posture should be  effortless for a healthy body. Returning to good posture is a gradual process. Many people can work toward this most comfortably by using various supports until they have the flexibility and strength to maintain this posture on their own. When sitting with proper posture, your ears will fall over your shoulders and your shoulders will fall over your hips. You should use the back of the chair to support your upper back. Your low back will be in a neutral position, just slightly arched. You may place a small pillow or folded towel at the base of your low back for support.  When working at a desk, create an environment that supports good, upright posture. Without extra support, muscles fatigue and lead to excessive strain on joints and other tissues. Keep these recommendations in mind: CHAIR:   A chair should be able to slide under your desk when your back makes contact with the back of the chair. This allows you to work closely.  The chair's height should allow your eyes to be level with the upper part of your monitor and your hands to be slightly lower than your elbows. BODY POSITION  Your feet should make contact with the floor. If this is not possible, use a foot rest.  Keep your ears over your shoulders. This will reduce stress on your neck and low back. INCORRECT SITTING POSTURES   If you are feeling tired and unable to assume a healthy sitting posture, do not slouch or slump. This puts excessive strain on your back tissues, causing more damage and pain. Healthier options include:  Using more support, like a lumbar pillow.  Switching tasks to something that requires you to be upright or walking.  Talking a brief walk.  Lying down to rest in a neutral-spine position. PROLONGED STANDING WHILE SLIGHTLY LEANING FORWARD  When completing a task that requires you to lean forward while standing in one place for a long time, place either foot up on a stationary 2-4 inch high object to help  maintain the best posture. When both feet are on the ground, the low back tends to lose its slight inward curve. If this curve flattens (or becomes too large), then the back and your other joints will experience too much stress, fatigue more quickly and can cause pain.  CORRECT STANDING POSTURES Proper standing posture should be  assumed with all daily activities, even if they only take a few moments, like when brushing your teeth. As in sitting, your ears should fall over your shoulders and your shoulders should fall over your hips. You should keep a slight tension in your abdominal muscles to brace your spine. Your tailbone should point down to the ground, not behind your body, resulting in an over-extended swayback posture.  INCORRECT STANDING POSTURES  Common incorrect standing postures include a forward head, locked knees and/or an excessive swayback. WALKING Walk with an upright posture. Your ears, shoulders and hips should all line-up. PROLONGED ACTIVITY IN A FLEXED POSITION When completing a task that requires you to bend forward at your waist or lean over a low surface, try to find a way to stabilize 3 of 4 of your limbs. You can place a hand or elbow on your thigh or rest a knee on the surface you are reaching across. This will provide you more stability so that your muscles do not fatigue as quickly. By keeping your knees relaxed, or slightly bent, you will also reduce stress across your low back. CORRECT LIFTING TECHNIQUES DO :   Assume a wide stance. This will provide you more stability and the opportunity to get as close as possible to the object which you are lifting.  Tense your abdominals to brace your spine; then bend at the knees and hips. Keeping your back locked in a neutral-spine position, lift using your leg muscles. Lift with your legs, keeping your back straight.  Test the weight of unknown objects before attempting to lift them.  Try to keep your elbows locked down at your  sides in order get the best strength from your shoulders when carrying an object.  Always ask for help when lifting heavy or awkward objects. INCORRECT LIFTING TECHNIQUES DO NOT:   Lock your knees when lifting, even if it is a small object.  Bend and twist. Pivot at your feet or move your feet when needing to change directions.  Assume that you cannot safely pick up a paperclip without proper posture.   This information is not intended to replace advice given to you by your health care provider. Make sure you discuss any questions you have with your health care provider.   Document Released: 03/01/2005 Document Revised: 07/16/2014 Document Reviewed: 06/13/2008 [employer] Patient Education [employer]

## 2014-12-30 NOTE — Progress Notes (Signed)
[facility] Phone: [phone]  Subjective:  Back Pain: Has been going on 1 month. Woke up from sleep one morning with the pain. Pain initially got worse, but has been stable for the last few weeks. No trauma. Has never had this type of pain before. Located on the left side of her lower back, radiates laterally. Pain is a sharp, throbbing pain. Also has pain shooting down the back of her leg to the back of her knee. Worse when she goes from standing to sitting, will start having spasms 30-40 minutes after sitting. Worse with flexion, squatting down to pick something up off the floor. Has tried Voltaren gel, Baclofen, Tylenol 3, Toradol shot. Toradol shot helped for that day, but pain relief only lasted for 1 day. Started taking 2 Tylenol 3's at a time, but was making her drowsy so she started taking 1. All have helped to dull the pain for a short time, but pain relief doesn't last. Has had some mild tingling on the anterior aspect of L thigh. Pain keeps her from sleeping. No fevers, chills, no night sweats. No fecal or urinary incontinence, no saddle anesthesia. Has had 10 pounds of unintentional weight loss in the last month. Has not been exercising more or eating less.   Has been seen by Dr. Nadine CountsGottschalk at the beginning of October. Was given back exercises, Tylenol 3, Toradol shot, and referred to Sports Medicine for possible steroid injection. Sports Medicine did not want to see her and said she should be seen by Pain Management.   All other ROS were reviewed and are negative unless otherwise noted in the HPI. Past Medical History- significant for osteoarthritis of spine, bipolar disorder, schizoaffective disorder, chronic pain in shoulders and knees. Reviewed problem list.  Medications- reviewed and updated Current Outpatient Prescriptions  Medication Sig Dispense Refill  . acetaminophen-codeine (TYLENOL #3) 300-30 MG tablet Take 1-2 tablets by mouth every 4 (four) hours  as needed for moderate pain or severe pain. 30 tablet 0  . albuterol (PROVENTIL HFA;VENTOLIN HFA) 108 (90 BASE) MCG/ACT inhaler Inhale 2 puffs into the lungs every 6 (six) hours as needed. Takes for shortness of breath 18 g 5  . baclofen (LIORESAL) 10 MG tablet Take 1 tablet (10 mg total) by mouth 3 (three) times daily as needed for muscle spasms. 90 each 2  . cetirizine (ZYRTEC) 10 MG tablet TAKE 1 TABLET BY MOUTH DAILY 30 tablet 11  . desipramine (NORPRAMIN) 25 MG tablet Take 1 tablet (25 mg total) by mouth at bedtime. For arthritis pain 30 tablet 3  . diclofenac sodium (VOLTAREN) 1 % GEL Apply 4 g topically 4 (four) times daily as needed (for shoulder & knee pain). 100 g 3  . hydrOXYzine (VISTARIL) 100 MG capsule Take 100 mg by mouth 2 (two) times daily as needed for itching.    . montelukast (SINGULAIR) 10 MG tablet Take 1 tablet (10 mg total) by mouth at bedtime. 30 tablet 3  . omeprazole (PRILOSEC) 40 MG capsule Take 1 capsule (40 mg total) by mouth daily. 90 capsule 3  . PRAZOSIN HCL PO Take by mouth.    . triamcinolone cream (KENALOG) 0.1 % APPLY EXTERNALLY TO THE AFFECTED AREA TWICE DAILY 45 g 0  . Venlafaxine HCl 37.5 MG TB24 Take by mouth. Two tablets in morning and one at night    . ziprasidone (GEODON) 80 MG capsule Take 80 mg by mouth 2 (two) times daily with a meal.  No current facility-administered medications for this visit.   Chief complaint-noted Family history reviewed for today's visit. No changes. Social history- patient is a current smoker. ~6 cigarettes/day.  Objective: BP 124/85 mmHg  Pulse 106  Temp(Src) 98.6 F (37 C) (Oral)  Ht  (1.676 m)  Wt 182 lb (82.555 kg)  BMI 29.39 kg/m2 Gen: Alert, appears uncomfortable with movement, is slow to move from sitting to standing HEENT: NCAT, EOMI, MMM Neck: FROM, supple CV: RRR, good S1/S2, no murmur Resp: CTABL, no wheezes, non-labored GI: SNTND, BS present Msk: No edema, warm, normal tone, moves UE/LE  spontaneously  Back: No TTP of spinous processes, no deformity or step-offs, pain with flexion, decreased ROM in all planes, TTP over L SI joint and L gluteus medius, TTP over L piriformis muscle, pain with internal rotation of L hip. Neuro: Alert and oriented, no gross deficits, +SLR on L, -SLR on R, 5/5 muscle strength in lower extremities bilaterally, 1+ patellar reflexes bilaterally, decreased sensation over L anterior thigh. Skin: No rashes, no lesions Psych: Appropriate mood and affect  Assessment/Plan: Lower back pain with sciatica: Likely secondary to piriformis syndrome, given TTP over piriformis. No red flags, but Pt does endorse new decreased sensation over L anterior thigh. Straight leg raise was positive on the left, which is also a new finding. - Precepted with Dr. Pollie Meyer  - Given the fact that she has had this back pain for 4 weeks without improvement and her new findings of +SLR and decreased sensation over her anterior thigh, will get a lumber x-ray at this point to r/u more serious causes of back pain. - Will try Gabapentin  tid for sciatic pain. Can increase dose as needed. - Pt given Toradol shot in the office today - Pt given handout on back exercises and advised to use heating pads - Pt to follow-up with PCP for regular appointment or sooner if necessary. - May need to consider future referral to pain clinic.  Willadean Carol, MD PGY-1

## 2014-12-31 ENCOUNTER — Telehealth: Payer: Self-pay | Admitting: Family Medicine

## 2014-12-31 NOTE — Telephone Encounter (Signed)
Will forward to MD. Corbin Falck,CMA  

## 2014-12-31 NOTE — Telephone Encounter (Signed)
Pt was seen yesterday by Dr. Nancy MarusMayo and was sent for Xrays. Would like to know if we have received these results or not; and if we have, then she would like to know what they were. Thank you, Dorothey BasemanSadie Reynolds, ASA

## 2015-01-12 ENCOUNTER — Other Ambulatory Visit: Payer: Self-pay | Admitting: Family Medicine

## 2015-01-14 ENCOUNTER — Ambulatory Visit (INDEPENDENT_AMBULATORY_CARE_PROVIDER_SITE_OTHER): Payer: Medicaid Other | Admitting: *Deleted

## 2015-01-14 ENCOUNTER — Other Ambulatory Visit: Payer: Self-pay | Admitting: Family Medicine

## 2015-01-14 DIAGNOSIS — M549 Dorsalgia, unspecified: Secondary | ICD-10-CM

## 2015-01-14 DIAGNOSIS — M5442 Lumbago with sciatica, left side: Secondary | ICD-10-CM

## 2015-01-14 MED ORDER — KETOROLAC TROMETHAMINE 30 MG/ML IM SOLN: INTRAMUSCULAR | Status: DC

## 2015-01-14 MED ORDER — KETOROLAC TROMETHAMINE 60 MG/2ML IM SOLN
60.0000 mg | INTRAMUSCULAR | Status: AC
  Administered 2015-01-14: 60 mg via INTRAMUSCULAR

## 2015-01-14 MED ORDER — KETOROLAC TROMETHAMINE 30 MG/ML IM SOLN: 30.0000 mg | Freq: Once | INTRAMUSCULAR | Status: DC

## 2015-01-14 NOTE — Progress Notes (Signed)
   Patient in nurse clinic for Toradol injection.  Patient is having back pain 8/10.  Verbal order given by Dr. Perley JainMcDiarmid; Toradol 60 mg/ 2 ML once a week.  Injection given in LUOQ.  Patient to follow up with PCP if pain persist.  Clovis PuMartin, Tamika L, RN

## 2015-01-23 ENCOUNTER — Encounter: Payer: Self-pay | Admitting: Family Medicine

## 2015-01-23 ENCOUNTER — Ambulatory Visit (INDEPENDENT_AMBULATORY_CARE_PROVIDER_SITE_OTHER): Payer: Medicaid Other | Admitting: Family Medicine

## 2015-01-23 VITALS — BP 134/98 | HR 80 | Temp 98.1°F | Ht 66.0 in | Wt 180.5 lb

## 2015-01-23 DIAGNOSIS — M791 Myalgia: Secondary | ICD-10-CM

## 2015-01-23 DIAGNOSIS — M5432 Sciatica, left side: Secondary | ICD-10-CM | POA: Diagnosis present

## 2015-01-23 DIAGNOSIS — M545 Low back pain: Secondary | ICD-10-CM | POA: Diagnosis not present

## 2015-01-23 DIAGNOSIS — G5702 Lesion of sciatic nerve, left lower limb: Secondary | ICD-10-CM

## 2015-01-23 DIAGNOSIS — M7918 Myalgia, other site: Secondary | ICD-10-CM

## 2015-01-23 MED ORDER — KETOROLAC TROMETHAMINE 60 MG/2ML IM SOLN
60.0000 mg | Freq: Once | INTRAMUSCULAR | Status: AC
  Administered 2015-01-23: 60 mg via INTRAMUSCULAR

## 2015-01-23 MED ORDER — ACETAMINOPHEN-CODEINE #3 300-30 MG PO TABS: 1.0000 | ORAL_TABLET | ORAL | Status: DC | PRN

## 2015-01-23 NOTE — Patient Instructions (Signed)
I think your back pain is coming from muscles in your low left back.   Will work on getting you a consultation with Chiropracter to work on soft tissue release of tight muscles.

## 2015-01-24 ENCOUNTER — Encounter: Payer: Self-pay | Admitting: Family Medicine
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
## 2015-01-24 NOTE — Assessment & Plan Note (Signed)
Established problem that has wiorsened. Diagnostic Therapeutic test with three 2 ml intramuscular injections of 1% lidocaine into tender sites of caudal thoracolumbar muscle bundle There was a reduction in pain with the injections which makes unilateral paralumbar muscular origin of pain more likely than a radicular origin, though radicular is not role out with this test.   Given the obvious increased localized muscular tension/tone and the lack of response of patient to muscle relaxant medication, and the lack of PT coverage by her insurance [facility](Medicaid) will refer patient to Chiropracteric therapy for manipulation and soft tissue release therapy.   Patient may continue to use the Tyl#3 with gabapentin at night which seems to help her sleep.   Will see patient back in 3 weeks.  If pain inadequately palliated, then will titrate up Desipramine bedtime dose.  Will start to consider MRI imaging to rule out radicular origin of this persistent pain with intermittent radicular symptoms.

## 2015-02-10 ENCOUNTER — Ambulatory Visit (INDEPENDENT_AMBULATORY_CARE_PROVIDER_SITE_OTHER): Payer: Medicaid Other | Admitting: *Deleted

## 2015-02-10 ENCOUNTER — Other Ambulatory Visit: Payer: Self-pay | Admitting: Family Medicine

## 2015-02-10 DIAGNOSIS — M7918 Myalgia, other site: Secondary | ICD-10-CM

## 2015-02-10 DIAGNOSIS — G8929 Other chronic pain: Secondary | ICD-10-CM

## 2015-02-10 DIAGNOSIS — M5442 Lumbago with sciatica, left side: Secondary | ICD-10-CM | POA: Diagnosis not present

## 2015-02-10 MED ORDER — KETOROLAC TROMETHAMINE 60 MG/2ML IM SOLN
60.0000 mg | Freq: Once | INTRAMUSCULAR | Status: AC
  Administered 2015-02-10: 60 mg via INTRAMUSCULAR

## 2015-02-10 MED ORDER — KETOROLAC TROMETHAMINE 60 MG/2ML IM SOLN: 60.0000 mg | Freq: Once | INTRAMUSCULAR | Status: DC

## 2015-02-10 NOTE — Progress Notes (Signed)
   Patient in nurse clinic today for Toradol injection.  Toradol 60 mg/2 mL given Left upper outer quadrant due to left side back and bottom pain.  Please order given by Dr. Perley JainMcDiarmid on 01/14/2015.  Clovis PuMartin, Tamika L, RN

## 2015-02-12 ENCOUNTER — Other Ambulatory Visit: Payer: Self-pay | Admitting: Family Medicine

## 2015-02-13 ENCOUNTER — Ambulatory Visit (INDEPENDENT_AMBULATORY_CARE_PROVIDER_SITE_OTHER): Payer: Medicaid Other | Admitting: Family Medicine

## 2015-02-13 ENCOUNTER — Encounter: Payer: Self-pay | Admitting: Family Medicine

## 2015-02-13 ENCOUNTER — Other Ambulatory Visit: Payer: Self-pay | Admitting: Family Medicine

## 2015-02-13 VITALS — BP 117/80 | HR 100 | Temp 98.4°F | Ht 66.0 in | Wt 170.4 lb

## 2015-02-13 DIAGNOSIS — Z79899 Other long term (current) drug therapy: Secondary | ICD-10-CM

## 2015-02-13 DIAGNOSIS — F3160 Bipolar disorder, current episode mixed, unspecified: Secondary | ICD-10-CM | POA: Diagnosis not present

## 2015-02-13 DIAGNOSIS — M47817 Spondylosis without myelopathy or radiculopathy, lumbosacral region: Secondary | ICD-10-CM | POA: Diagnosis not present

## 2015-02-13 DIAGNOSIS — F17200 Nicotine dependence, unspecified, uncomplicated: Secondary | ICD-10-CM

## 2015-02-13 DIAGNOSIS — B373 Candidiasis of vulva and vagina: Secondary | ICD-10-CM

## 2015-02-13 DIAGNOSIS — B3731 Acute candidiasis of vulva and vagina: Secondary | ICD-10-CM

## 2015-02-13 DIAGNOSIS — M791 Myalgia: Secondary | ICD-10-CM | POA: Diagnosis not present

## 2015-02-13 DIAGNOSIS — M7918 Myalgia, other site: Secondary | ICD-10-CM

## 2015-02-13 LAB — BASIC METABOLIC PANEL
BUN: 4 mg/dL — AB (ref 7–25)
CALCIUM: 9.9 mg/dL (ref 8.6–10.2)
CO2: 28 mmol/L (ref 20–31)
CREATININE: 0.78 mg/dL (ref 0.50–1.10)
Chloride: 100 mmol/L (ref 98–110)
GLUCOSE: 83 mg/dL (ref 65–99)
POTASSIUM: 3.8 mmol/L (ref 3.5–5.3)
Sodium: 139 mmol/L (ref 135–146)

## 2015-02-13 MED ORDER — FLUCONAZOLE 150 MG PO TABS: 150.0000 mg | ORAL_TABLET | Freq: Once | ORAL | Status: DC

## 2015-02-13 MED ORDER — KETOROLAC TROMETHAMINE 60 MG/2ML IM SOLN
60.0000 mg | Freq: Once | INTRAMUSCULAR | Status: AC
  Administered 2015-02-13: 60 mg via INTRAMUSCULAR

## 2015-02-13 MED ORDER — TRAMADOL HCL 50 MG PO TABS: 50.0000 mg | ORAL_TABLET | Freq: Three times a day (TID) | ORAL | Status: DC | PRN

## 2015-02-13 MED ORDER — GABAPENTIN 300 MG PO CAPS: 300.0000 mg | ORAL_CAPSULE | Freq: Three times a day (TID) | ORAL | Status: DC

## 2015-02-13 NOTE — Progress Notes (Signed)
   Subjective:    Patient ID: [name], female    DOB: 12-22-1980, 34 y.o.   MRN: [id number]  HPI  Back Pain, Chronic -Ongoing problemcomplaint since 05/2013 - Patient is receiving Chiropractic manipulation by Dr Jacqualyn PoseyEugene Lewis to whom we referred patient.  - She feels that Chiropractery is helping. - She is not taking the Desipramine for neuropathic pain\ - She is taking gabapentin 100 mg tid and Tylenol #3.  The combination is helpful at bedtime for falling asleep - She is on Venlafaxine by [facility].  - She is able to perform her ADLs and iADL despite pain in back.  - No change in location of pain.  Radiation intermittently to posterolateral left thigh to just above knee.  - No weakness or numbness in legs.  No urinary incontinency  Mood Disorder - Long standing issue for patient - [facility] her psychotropic medications.   - Stress: Feels abandoned by others whom she has counted on to be supportive.   - Strengths: Support from her husband and children and her mother. - Fatigue and lack of motivation is prominent - She has no plans to harm herself.  She identifies her children as keeping her from harming herself. - She reports compliance with Venlafaxine, Geodon, and Prazosin.    Recent Review Flowsheet Data    Dep[facility] screen [facility] 2/9 02/13/2015 01/23/2015 12/18/2014 08/08/2014 07/19/2014   Decreased Interest 3 0 0 3 3   Down, Depressed, Hopeless 2 1 1 2 3    PHQ - 2 Score 5 1 1 5 6    Altered sleeping 3 - - 3 3   Tired, decreased energy 2 - - 2 3   Change in appetite 2 - - 2 0   Feeling bad or failure about yourself  2 - - 1 1   Trouble concentrating 1 - - 2 1   Moving slowly or fidgety/restless 2 - - 2 2   Suicidal thoughts 1 - - 1 1   PHQ-9 Score 18 - - 18 17   Difficult doing work/chores Extremely dIfficult - - Very difficult Somewhat difficult     Smoking status noted Review of Systems  No syncope, no dizziness No shortness of breath.       Objective:   Physical Exam Gen: Alert, groomed, cooperative, tearful at times, occasional smile. Neuro: normal gait Psych: clear speech, language contains abstract concepts, goal directed, able to express feeling and thoughts with clarity       Assessment & Plan:  30 minutes face to face where spent in total with counseling / coordination of care took more than 50% of the total time. Counseling involved discussion of the multiple interpersonal stressors in her life, her responses to her stressors. BATHE technique used.

## 2015-02-13 NOTE — Assessment & Plan Note (Signed)
Established problem Controlled Chiropractic interventions by Dr Melvyn NethLewis have been helpful.  Stop Tylenol #3 Rx Tramadol #30 RF#1 Increase Gabapentin to 300 mg TID  May consult PM&R for consideration of local needling or injection therapies.

## 2015-02-13 NOTE — Assessment & Plan Note (Addendum)
Established problem Ongoing depressed mood without plans for self-harm Continue on Venlafaxine, Geodon and Prazosin.  Following up with counseling.  [facility]

## 2015-02-13 NOTE — Patient Instructions (Addendum)
Use the Tramadol tablet 1 to 2 as needed for back pain. Increase your Gabapentin to 300 mg three times a day.  Try this medication at home to see if it makes you drowsy before driving or operating heavy equipment.  Dr Alia Parsley will call you if your tests are not good. Otherwise he will send you a letter.  If you sign up for MyChart online, you will be able to see your test results once Dr Morgin Halls has reviewed them.  If you do not hear from us with in 2 weeks please call our office

## 2015-02-13 NOTE — Assessment & Plan Note (Signed)
Established problem Shirley [name] is not interested in cessation yet. Contemplative phase.  Offered cessation services if she should make decision to quit.

## 2015-02-19 ENCOUNTER — Telehealth: Payer: Self-pay | Admitting: Family Medicine

## 2015-02-19 NOTE — Telephone Encounter (Signed)
Pt picked up toradol medicine. She usually has the injections done here. She doesn't know what to do with these injections

## 2015-02-19 NOTE — Telephone Encounter (Signed)
Will forward to MD.  Not sure if this medication was ordered by mistake when patient was last here.  Patient could always bring it with her when she is due for her next injection and we can just use that medication. Madline Oesterling,CMA

## 2015-02-20 NOTE — Telephone Encounter (Signed)
Was toradol injectable sent to her pharmacy? She may bring it with her to [facility] the next time she needs a Toradol injection. We can use it instead of our Toradol stock.

## 2015-02-21 ENCOUNTER — Ambulatory Visit: Payer: Medicaid Other

## 2015-02-21 NOTE — Telephone Encounter (Signed)
Spoke with patient and the medication was prescribed by Dr. Nadine CountsGottschalk.  I explained to her that sometimes this happens when we are ordering medications for in house.  Patient voiced understanding and would like an appt today to have an injection.  Appt made for nurse visit for her standing order toradol dose.  Jazmin Hartsell,CMA

## 2015-02-21 NOTE — Telephone Encounter (Signed)
Acknowledged. Agree with providing patient with Toradol IM injection 60 mg today for chronic low back pain.

## 2015-02-24 ENCOUNTER — Ambulatory Visit (INDEPENDENT_AMBULATORY_CARE_PROVIDER_SITE_OTHER): Payer: Medicaid Other | Admitting: *Deleted

## 2015-02-24 DIAGNOSIS — M47817 Spondylosis without myelopathy or radiculopathy, lumbosacral region: Secondary | ICD-10-CM

## 2015-02-24 MED ORDER — KETOROLAC TROMETHAMINE 60 MG/2ML IM SOLN
60.0000 mg | Freq: Once | INTRAMUSCULAR | Status: AC
  Administered 2015-02-24: 60 mg via INTRAMUSCULAR

## 2015-02-24 NOTE — Progress Notes (Signed)
  Back Pain: Patient presents for Toradol injection of low back pain.  Patient brought in Toradol injection from pharmacy.  Per order by Dr. Perley JainMcDiarmid, patient may have Toradol 60 mg/2 ML once a week from nursing staff for pain.  Injection given LUOQ.  Patient to follow up with PCP.  Clovis PuMartin, Tamika L, RN

## 2015-03-03 ENCOUNTER — Ambulatory Visit (INDEPENDENT_AMBULATORY_CARE_PROVIDER_SITE_OTHER): Payer: Medicaid Other | Admitting: *Deleted

## 2015-03-03 DIAGNOSIS — M791 Myalgia: Secondary | ICD-10-CM | POA: Diagnosis not present

## 2015-03-03 DIAGNOSIS — M7918 Myalgia, other site: Secondary | ICD-10-CM

## 2015-03-03 MED ORDER — KETOROLAC TROMETHAMINE 30 MG/ML IJ SOLN
60.0000 mg | Freq: Once | INTRAMUSCULAR | Status: AC
  Administered 2015-03-03: 60 mg via INTRAMUSCULAR

## 2015-03-03 NOTE — Progress Notes (Signed)
Patient presents for Toradol injection of low back pain. Patient brought in Toradol injection from pharmacy. Per order by Dr. Perley JainMcDiarmid, patient may have Toradol 60 mg/2 ML once a week from nursing staff for pain. Injection given LUOQ.  Advised that we will be closed the next 2 Mondays so she will have to come on Tuesday if needed. Fleeger, Maryjo RochesterJessica Dawn

## 2015-03-13 ENCOUNTER — Ambulatory Visit (INDEPENDENT_AMBULATORY_CARE_PROVIDER_SITE_OTHER): Payer: Medicaid Other | Admitting: *Deleted

## 2015-03-13 DIAGNOSIS — M479 Spondylosis, unspecified: Secondary | ICD-10-CM

## 2015-03-13 MED ORDER — KETOROLAC TROMETHAMINE 60 MG/2ML IM SOLN
60.0000 mg | Freq: Once | INTRAMUSCULAR | Status: AC
  Administered 2015-03-13: 60 mg via INTRAMUSCULAR

## 2015-03-13 NOTE — Progress Notes (Signed)
   Patient in nurse clinic for Toradol injection.  Patient is having left side pain 8/10.  Pt can have Toradol 60 mg/2 ml once a week from nursing staff per Dr. McDiarmid orders.  Injection given RUOQ.  Pt to follow up with PCP.  Clovis PuMartin, Karma Hiney L, RN

## 2015-03-20 ENCOUNTER — Ambulatory Visit (INDEPENDENT_AMBULATORY_CARE_PROVIDER_SITE_OTHER): Payer: Medicaid Other | Admitting: Family Medicine

## 2015-03-20 ENCOUNTER — Encounter: Payer: Self-pay | Admitting: Family Medicine

## 2015-03-20 ENCOUNTER — Other Ambulatory Visit: Payer: Self-pay | Admitting: Family Medicine

## 2015-03-20 VITALS — BP 129/94 | HR 87 | Temp 98.6°F | Wt 160.4 lb

## 2015-03-20 DIAGNOSIS — M7918 Myalgia, other site: Secondary | ICD-10-CM

## 2015-03-20 DIAGNOSIS — M791 Myalgia: Secondary | ICD-10-CM

## 2015-03-20 DIAGNOSIS — M5432 Sciatica, left side: Secondary | ICD-10-CM | POA: Diagnosis present

## 2015-03-20 MED ORDER — KETOROLAC TROMETHAMINE 60 MG/2ML IM SOLN
60.0000 mg | Freq: Once | INTRAMUSCULAR | Status: AC
  Administered 2015-03-20: 60 mg via INTRAMUSCULAR

## 2015-03-20 MED ORDER — MELOXICAM 7.5 MG PO TABS: 7.5000 mg | ORAL_TABLET | Freq: Every day | ORAL | Status: DC

## 2015-03-20 NOTE — Patient Instructions (Signed)
Start Meloxicam one tablet a day to help decrease back pain some  We are working on scheduling you an MRI of your low back.  Contact our office in one week if you have not heard from our office about the MRI.   We are working on scheduling you an appointment with Physical Medicine and Rehabilitation physicians to see if they can offer help with muscle pain in back and buttock.   Dr Shelle Galdamez will try to get a Transcutaneous Electrical Neurostimulator (TENS) unit approved by Medicaid to see if if can help with your pain.

## 2015-03-21 ENCOUNTER — Encounter (HOSPITAL_COMMUNITY): Payer: Self-pay | Admitting: Emergency Medicine

## 2015-03-21 ENCOUNTER — Emergency Department (INDEPENDENT_AMBULATORY_CARE_PROVIDER_SITE_OTHER)
Admission: EM | Admit: 2015-03-21 | Discharge: 2015-03-21 | Disposition: A | Discharge status: Home / Self Care | Payer: Medicaid Other | Source: Home / Self Care | Attending: Family Medicine | Admitting: Family Medicine

## 2015-03-21 ENCOUNTER — Encounter: Payer: Self-pay | Admitting: Family Medicine

## 2015-03-21 DIAGNOSIS — R112 Nausea with vomiting, unspecified: Secondary | ICD-10-CM

## 2015-03-21 MED ORDER — ONDANSETRON HCL 4 MG/2ML IJ SOLN
4.0000 mg | Freq: Once | INTRAMUSCULAR | Status: AC
  Administered 2015-03-21: 4 mg via INTRAMUSCULAR

## 2015-03-21 MED ORDER — ONDANSETRON HCL 4 MG/2ML IJ SOLN
INTRAMUSCULAR | Status: AC
  Filled 2015-03-21: qty 2

## 2015-03-21 MED ORDER — ONDANSETRON HCL 4 MG PO TABS: 4.0000 mg | ORAL_TABLET | Freq: Four times a day (QID) | ORAL | Status: DC

## 2015-03-21 NOTE — ED Notes (Signed)
Reports nausea and vomiting that started about 12pm

## 2015-03-21 NOTE — Discharge Instructions (Signed)
Clear liquid diet tonight as tolerated, advance on sat as improved, use medicine as needed, return or see your doctor if any problems. °

## 2015-03-21 NOTE — Assessment & Plan Note (Signed)
Established Problem Uncontrolled pain that is limiting ambulation and movement activities beyond mere ADLs.  Medicaid will not cover Physical therapy for patient.   Given duration of pain for around 20 months, component of left lumbosacral radicular symptoms, failure to respond to conservative measures including analgesics, adjunct analgesics and chiropractery, it seems reasonable to look for radiculopathy or myelopathy origin of pain using Lumbar MRI.   In addition, will ask PM&R specialists to see if there may be help in decreasing patient's pain with muscle injection/needling, lumbar medial or dorsal ramus nerve blocks, epidural injection, etc..  Patient has not been seeking opiates for treatment of her pain as documented in review of [place] [facility].   Will add Meloxicam 7.5 mg daily to current regiment though will need to monitor for UGI upset which has been an issue for [name] in the past.   Toradol 60 mg IM x 1 given in clinic today for acute relief from current exacerbation of chronic pain.   Will explore if Medicaid will cover a TENS unit trial for patient's back pain.

## 2015-03-21 NOTE — Progress Notes (Signed)
   Subjective:    Patient ID: [name], female    DOB: 05-13-1980, 35 y.o.   MRN: [id number] [name] is alone Sources of clinical information for visit is/are patient and [facility] (Dr Scarlette SliceMichael Edgerton). Summary of records: Patient with hypolordosis lumbar spine, mild left lateral curvature lumbar spine, foraminal stenosis/facet syndrome L5/S1   HPI  BACK PAIN, Chronic Onset since March 2015 Location:left lower lumbar and superior left buttock  Quality: dull aching baseline with sharp pain with movement, sometimes radiating in left lateral calf.   Worse with: prolonged sitting, transitions from sitting to standing, walking.   Better with: Chiropractic therapies, though she has exhausted the 8 sessions that Medicaid will pay for until July 2017. No improvement with recent prescription of tramadol and increased gabapentin daily dose 900 mg.   Radiation: intermittently into left lateral calf.  Trauma: none  Red Flags Fecal/urinary incontinence: no  Numbness/Weakness: no  Fever/chills/sweats: no  Night pain: no  Unexplained weight loss: no  No relief with bedrest: no  h/o cancer/immunosuppression: no  IV drug use: no   PMH of osteoporosis or chronic steroid use: no    SH: (+) tobacco    Review of Systems  See HPI     Objective:   Physical Exam VS reviewed Gen: Mild distress, displays of pain with movements in chair and with sitting to standing transition.  Gait: nonatalgic. Slowed pace. Stable station.  Neuro: able to go up on heels and toes.  DTR ankle and knees zero bilaterally       Assessment & Plan:
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
## 2015-03-23 ENCOUNTER — Inpatient Hospital Stay: Admission: RE | Admit: 2015-03-23 | Payer: Medicaid Other | Source: Ambulatory Visit

## 2015-03-25 ENCOUNTER — Other Ambulatory Visit: Payer: Self-pay | Admitting: Family Medicine

## 2015-03-25 ENCOUNTER — Encounter: Payer: Self-pay | Admitting: Family Medicine

## 2015-03-25 DIAGNOSIS — M47817 Spondylosis without myelopathy or radiculopathy, lumbosacral region: Secondary | ICD-10-CM

## 2015-03-25 DIAGNOSIS — M545 Low back pain, unspecified: Secondary | ICD-10-CM

## 2015-03-25 DIAGNOSIS — M4057 Lordosis, unspecified, lumbosacral region: Secondary | ICD-10-CM

## 2015-03-25 DIAGNOSIS — M5442 Lumbago with sciatica, left side: Secondary | ICD-10-CM

## 2015-03-25 DIAGNOSIS — M7918 Myalgia, other site: Secondary | ICD-10-CM

## 2015-03-25 DIAGNOSIS — M47816 Spondylosis without myelopathy or radiculopathy, lumbar region: Secondary | ICD-10-CM | POA: Insufficient documentation

## 2015-03-25 HISTORY — DX: Spondylosis without myelopathy or radiculopathy, lumbosacral region: M47.817

## 2015-03-25 HISTORY — DX: Low back pain, unspecified: M54.50

## 2015-03-25 HISTORY — DX: Myalgia, other site: M79.18

## 2015-03-25 HISTORY — DX: Lordosis, unspecified, lumbosacral region: M40.57

## 2015-03-25 HISTORY — DX: Lumbago with sciatica, left side: M54.42

## 2015-03-28 ENCOUNTER — Ambulatory Visit (INDEPENDENT_AMBULATORY_CARE_PROVIDER_SITE_OTHER): Payer: Medicaid Other | Admitting: *Deleted

## 2015-03-28 DIAGNOSIS — M479 Spondylosis, unspecified: Secondary | ICD-10-CM

## 2015-03-28 MED ORDER — KETOROLAC TROMETHAMINE 60 MG/2ML IM SOLN
60.0000 mg | Freq: Once | INTRAMUSCULAR | Status: AC
  Administered 2015-03-28: 60 mg via INTRAMUSCULAR

## 2015-03-28 NOTE — Progress Notes (Signed)
   Back Pain: Patient presents in nurse for Toradol injection of back problems.  Patient has standing order for Toradol 60 mg IM once a week by nursing staff prescribed by Dr. Perley JainMcDiarmid.  Patient stated she has several upcoming appointments next week for more evaluations for her back pain.  Toradol 60 mg/ 2 ML given LQUO.  Clovis PuMartin, Jerimy Johanson L, RN

## 2015-04-01 ENCOUNTER — Ambulatory Visit
Admission: RE | Admit: 2015-04-01 | Discharge: 2015-04-01 | Disposition: A | Discharge status: Home / Self Care | Payer: Medicaid Other | Source: Ambulatory Visit | Attending: Family Medicine | Admitting: Family Medicine

## 2015-04-01 DIAGNOSIS — M5432 Sciatica, left side: Secondary | ICD-10-CM

## 2015-04-02 ENCOUNTER — Ambulatory Visit (INDEPENDENT_AMBULATORY_CARE_PROVIDER_SITE_OTHER): Payer: Medicaid Other | Admitting: Family Medicine

## 2015-04-02 ENCOUNTER — Ambulatory Visit: Payer: Medicaid Other | Attending: Family Medicine | Admitting: Physical Therapy

## 2015-04-02 ENCOUNTER — Other Ambulatory Visit: Payer: Self-pay | Admitting: Family Medicine

## 2015-04-02 ENCOUNTER — Encounter: Payer: Self-pay | Admitting: Family Medicine

## 2015-04-02 VITALS — BP 135/100 | HR 75 | Temp 98.1°F | Wt 169.9 lb

## 2015-04-02 DIAGNOSIS — R209 Unspecified disturbances of skin sensation: Secondary | ICD-10-CM

## 2015-04-02 DIAGNOSIS — M5410 Radiculopathy, site unspecified: Secondary | ICD-10-CM | POA: Diagnosis present

## 2015-04-02 DIAGNOSIS — J040 Acute laryngitis: Secondary | ICD-10-CM

## 2015-04-02 DIAGNOSIS — M5442 Lumbago with sciatica, left side: Secondary | ICD-10-CM

## 2015-04-02 DIAGNOSIS — R208 Other disturbances of skin sensation: Secondary | ICD-10-CM | POA: Insufficient documentation

## 2015-04-02 DIAGNOSIS — R29898 Other symptoms and signs involving the musculoskeletal system: Secondary | ICD-10-CM | POA: Diagnosis present

## 2015-04-02 MED ORDER — MELOXICAM 15 MG PO TABS: 15.0000 mg | ORAL_TABLET | Freq: Every day | ORAL | Status: DC

## 2015-04-02 NOTE — Assessment & Plan Note (Signed)
Sore throat, cough and hoarseness, likely due to acute laryngitis made worse with current smoking.  - Advised symptomatic treatment with oral hydration, cough drops and warm tea with honey - Increased Mobic to 15 mg daily - Advised she could use her Tylenol 3. 1 pill every 6 hours as needed for pain and cough - f/u with PCP as scheduled or sooner if symptoms persist after 7-10 days

## 2015-04-02 NOTE — Progress Notes (Signed)
Patient ID: [name], female   DOB: 04-09-1980, 35 y.o.   MRN: [id number] I spoke face to face with Ms Montagna during her acute visit with Dr Gayla Doss for a sore throat. I relay the findings on her Lumbar MRI from 03/31/16, including  tears of left-side of L4/L5 and L5/S1 annuli with possible irritation of Left L4 and L5 nerve roots.  Given that these possible irritation of spinal nerve roots may correspond with the patient's persistent localized left lumbar back pain with left leg sciatica complaint, it seems reasonable to refer Ms Lattner to Neurosurgery for evaluation and recommendations concerning surgical treatments. A referral has been placed for Ms Sill to consult with Dr Maeola Harman for his neurosurgical opinion.   Ms Helser has appointment with the PA for PM&R physician, Sheran Luz, tomorrow to look in to non-surgical options to treat her low back pain with sciatica.

## 2015-04-02 NOTE — Patient Instructions (Signed)
Posture Tips DO: - stand tall and erect - keep chin tucked in - keep head and shoulders in alignment - check posture regularly in mirror or large window - pull head back against headrest in car seat;  Change your position often.  Sit with lumbar support. DON'T: - slouch or slump while watching TV or reading - sit, stand or lie in one position  for too long;  Sitting is especially hard on the spine so if you sit at a desk/use the computer, then stand up often!   Copyright  VHI. All rights reserved.  Posture - Standing   Good posture is important. Avoid slouching and forward head thrust. Maintain curve in low back and align ears over shoul- ders, hips over ankles.  Pull your belly button in toward your back bone.   Copyright  VHI. All rights reserved.  Posture - Sitting   Sit upright, head facing forward. Try using a roll to support lower back. Keep shoulders relaxed, and avoid rounded back. Keep hips level with knees. Avoid crossing legs for long periods.   Copyright  VHI. All rights reserved.    Reducing Load   Copyright  VHI. All rights reserved.  BODY MECHANICS Tips Good body mechanics are important during activities of daily living. The practice of good body mechanics will: -help distribute weight throughout the skeleton in a more anatomically correct manner thus stimulating more normal forces on the bones, and encouraging stronger, healthier, denser bones. -reduce unnatural forces on bones, ligaments, joints and muscles and reduce risk of fracture, other injury or back pain. A WORD ON BODY POSITIONING: Sitting is the hardest position for the back. Lying on the back is the easiest. Standing, in good body alignment, is somewhere between. A good motto is: Sit less, stand more, and, when you can't do that, lie down on your back and exercise to strengthen it.  Copyright  VHI. All rights reserved.       Supine to Sit (Active)   Lie on back, left leg bent. Roll to other side.  From side-lying, sit up on side of bed. Complete ___ sets of ___ repetitions. Perform ___ sessions per day.  Copyright  VHI. All rights reserved.    Housework - Reaching Down   If you are unable to bend your knees or squat, use a lazy Manuela Schwartz to keep items within easy reach. Store only light, unbreakable items on the lowest shelves, and use a reacher to pick them up.  Copyright  VHI. All rights reserved.  Low Shelf   Squat down, and bring item close to lift.   Copyright  VHI. All rights reserved.  Lifting Principles .Maintain proper posture and head alignment. .Slide object as close as possible before lifting. .Move obstacles out of the way. .Test before lifting; ask for help if too heavy. .Tighten stomach muscles without holding breath. .Use smooth movements; do not jerk. .Use legs to do the work, and pivot with feet. .Distribute the work load symmetrically and close to the center of trunk. .Push instead of pull whenever possible.  Copyright  VHI. All rights reserved.  Posture - Standing   Good posture is important. Avoid slouching and forward head thrust. Maintain curve in low back and align ears over shoul- ders, hips over ankles.   Copyright  VHI. All rights reserved.   Posture - Sitting   Sit upright, head facing forward. Try using a roll to support lower back. Keep shoulders relaxed, and avoid rounded back. Keep hips level  with knees. Avoid crossing legs for long periods.   Copyright  VHI. All rights reserved.  Ideal Posture Use with figures on 3 (2 of 2): 1.Head erect 2.Chin in 3.Chest and navel aligned 4.Spinal curves maintained 5.Knees relaxed 6.Shoulders and hips aligned 7.Feet slightly apart 8.Toes and arches active 9.Abdomen taut (breathe with diaphragm) 10.Arms at sides Ideal posture is: -pain free. -achieved with practice, mindful interest, and body awareness.  Copyright  VHI. All rights reserved.     Move heavy items one at a time, or move  portions of the contents.   Posture Awareness     Stand and check posture: Jut chin, pull back to comfortable position. Tilt pelvis forward, back; be sure back is not swayed. Roll from heels to balls of feet, then distribute your weight evenly. Picture a line through spine pulling you erect. Focus on breathing. Good Posture = Better Breathing. Check ____ times per day.  http://gt2.exer.us/873   Copyright  VHI. All rights reserved.     Elbow Prop (Extension)   Prop body up on elbows for ___30-60_ seconds. Slowly lower it. Repeat ___3_ times. Do _2___ sessions per day.  http://gt2.exer.us/243   Copyright  VHI. All rights reserved.  Back Hyperextension: Using Arms   Lying face down with arms bent, inhale. Then while exhaling, straighten arms. Hold __10__ seconds. Slowly return to starting position. Repeat __5-10_ times per set. Do ___2_ sets per session. Do _2___ sessions per day. Extension    Backward Bend (Standing)   Arch backward to make hollow of back deeper. Hold __5-10__ seconds. Repeat ___5_ times per set. Do _1___ sets per session. Do ___2_ sessions per day. OR AS NEEDED  http://orth.exer.us/178   Copyright  VHI. All rights reserved.   BACK: Child's Pose (Sciatica)    Sit in knee-chest position and reach arms forward. Separate knees for comfort. Hold position for _3-5__ breaths. Repeat _3__ times. Do __2_ times per day.  Copyright  VHI. All rights reserved.
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
## 2015-04-02 NOTE — Progress Notes (Signed)
   Subjective:    Patient ID: [name], female    DOB: 25-Apr-1980, 35 y.o.   MRN: [id number]  Seen for Same day visit for   CC: sore throat  SORE THROAT  Sore throat began 3 days ago. Pain is: dry, scratchy  Severity: moderate Medications tried: ultram, mobic, theraflu Strep throat exposure: no STD exposure: no  Symptoms Fever: no Cough: yes Runny nose: no Muscle aches: yes Swollen Glands: no Trouble breathing: no Drooling: no Weight loss: no  Review of Systems   See HPI for ROS. Objective:  BP 135/100 mmHg  Pulse 75  Temp(Src) 98.1 F (36.7 C) (Oral)  Wt 169 lb 14.4 oz (77.066 kg)  General: NAD Head: Normocephalic/Atraumatic; Scalp w/o lesions Eyes:Sclera white; Conjunctiva pink; PERRLA; EOMI; Nose: Mucosa pink; No sinus tenderness; bilateral swollen nasal turbinates Throat: Oral mucosa pink, Pharynx erythematous w/o exudates Cardiac: RRR, normal heart sounds, no murmurs. 2+ radial and PT pulses bilaterally Respiratory: CTAB, normal effort es: no edema or cyanosis. WWP. Skin: warm and dry, no rashes noted    Assessment & Plan:   Laryngitis, acute Sore throat, cough and hoarseness, likely due to acute laryngitis made worse with current smoking.  - Advised symptomatic treatment with oral hydration, cough drops and warm tea with honey - Increased Mobic to 15 mg daily - Advised she could use her Tylenol 3. 1 pill every 6 hours as needed for pain and cough - f/u with PCP as scheduled or sooner if symptoms persist after 7-10 days

## 2015-04-02 NOTE — Patient Instructions (Signed)
It was great seeing you today.  Your sore throat and hoarseness are most consistent with laryngitis.  This most likely caused by viral infection and made worse by smoking.  This usually resolves with 7-10 days of voice rest and maintaining good hydration.   1. Increase your Mobic to 15 mg once a day. Two of the 7.5 mg pills. The new presciption will be for 15 mg pills, so you will only need to take one a day.  2. You can use her Tylenol 3 that you have at home 1 pill every 6 hours as needed for pain and cough.  3. Cough drops as well as warm teas with honey can be soothing  If you have any questions or concerns before then, please call the clinic at [phone].  Take Care,   Dr Wenda Low

## 2015-04-09 ENCOUNTER — Encounter: Payer: Self-pay | Admitting: Family Medicine

## 2015-04-09 ENCOUNTER — Ambulatory Visit (INDEPENDENT_AMBULATORY_CARE_PROVIDER_SITE_OTHER): Payer: Medicaid Other | Admitting: Family Medicine

## 2015-04-09 VITALS — BP 113/78 | HR 92 | Temp 98.3°F | Resp 18 | Ht 66.0 in | Wt 167.4 lb

## 2015-04-09 DIAGNOSIS — J209 Acute bronchitis, unspecified: Secondary | ICD-10-CM | POA: Diagnosis present

## 2015-04-09 MED ORDER — AZITHROMYCIN 250 MG PO TABS: ORAL_TABLET | ORAL | Status: DC
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
## 2015-04-09 NOTE — Patient Instructions (Signed)
You have bronchitis. We will prescribe you a z-pack. Please take this for the next 5 days. For your runny nose and congestion, you can try taking afrin nasal spray. Please only use this for 3 days.  Please come back when you are feeling better for a full lung function test here.  Take care,  Dr Jimmey Ralph

## 2015-04-09 NOTE — Progress Notes (Signed)
Patient here for Cold SX  Symptoms have been present for 2 weeks. Patient took Theraflu and had no relief.  Patient complains of back scaled currently at a 7. Described as sharp pain during spasms.

## 2015-04-14 ENCOUNTER — Other Ambulatory Visit: Payer: Self-pay | Admitting: Family Medicine

## 2015-04-16 ENCOUNTER — Telehealth: Payer: Self-pay | Admitting: Family Medicine

## 2015-04-16 NOTE — Telephone Encounter (Signed)
Will forward to MD to see if this was discussed with patient when imaging results were relayed. Jazmin Hartsell,CMA

## 2015-04-16 NOTE — Telephone Encounter (Signed)
Pt called and was checking the status of the TENS unit the doctor was going to order. jw

## 2015-04-21 NOTE — Telephone Encounter (Signed)
1) Please let Ms [name] know that Medicaid will not cover the cost of a TENS unit, but [facility] is      looking into getting Ms [name] a refurbished TENS unit from [employer] medical supply company at a greatly reduced cost.       Dr Trula Frede will let Ms [name] know more about the possibility of a TENS unit once the Physical therapist gets back in contact        with him about the refurbished TENS unit.   2) Please ask Ms [name] if she has received her appointment with Dr Venetia Maxon (Neurosurgeon) yet?

## 2015-04-22 ENCOUNTER — Telehealth: Payer: Self-pay | Admitting: Physical Therapy

## 2015-04-22 NOTE — Telephone Encounter (Signed)
I spoke with [employer] customer service and the rep stated that Medicaid DOES cover Home TENS for a diagnosis of low back pain.    Spoke with Dr. McDiarmid to relay the info.  He can work directly with [employer] to get the unit shipped and dispensed to the patient.    Fax [phone] Customer Service.

## 2015-04-22 NOTE — Telephone Encounter (Signed)
Patient states that she has had her epidural performed but also had to have a blood patch due to some complications.  She is scheduled with neurosugery in march.  Patient is aware of tens unit situation and will wait to hear from [place].  She has a follow up with PCP on 05/08/15. Jazmin Hartsell,CMA

## 2015-04-28 ENCOUNTER — Other Ambulatory Visit: Payer: Self-pay | Admitting: Family Medicine

## 2015-04-28 ENCOUNTER — Encounter: Payer: Self-pay | Admitting: Family Medicine

## 2015-04-28 ENCOUNTER — Telehealth: Payer: Self-pay | Admitting: Family Medicine

## 2015-04-28 DIAGNOSIS — M5442 Lumbago with sciatica, left side: Secondary | ICD-10-CM

## 2015-04-28 DIAGNOSIS — G549 Nerve root and plexus disorder, unspecified: Secondary | ICD-10-CM

## 2015-04-28 HISTORY — DX: Nerve root and plexus disorder, unspecified: G54.9

## 2015-04-28 NOTE — Telephone Encounter (Signed)
I called [employer] in [place]. I spoke with Okey Regal in customer Service about information their company would need to supply a TENS unit to a Patient with [place] Medicaid, including Prescription for TENS device, Patient's demographic sheet, Insurance policy and relevant office notes to justify Medical Necessity for device. She reported that they will send the Certificate of Medical Necessity relevant to the TENS unit back to our practice for me to authorize device.   Please let [name] know that Dr Ricahrd Schwager is sending in paperwork to [employer] For a TENS unit for [name] that is to be covered by her [place] Medicaid.  She should expect to be contacted by [employer] about the TENS unit.

## 2015-04-28 NOTE — Telephone Encounter (Signed)
Spoke with patient and she has already received call from [employer] and they will be contacting her in the next 2 days to schedule delivery.  [name],CMA

## 2015-05-08 ENCOUNTER — Encounter: Payer: Self-pay | Admitting: Family Medicine

## 2015-05-08 ENCOUNTER — Ambulatory Visit (INDEPENDENT_AMBULATORY_CARE_PROVIDER_SITE_OTHER): Payer: Medicaid Other | Admitting: Family Medicine

## 2015-05-08 VITALS — BP 111/79 | HR 91 | Temp 98.4°F | Ht 66.0 in | Wt 165.0 lb

## 2015-05-08 DIAGNOSIS — L709 Acne, unspecified: Secondary | ICD-10-CM

## 2015-05-08 DIAGNOSIS — L209 Atopic dermatitis, unspecified: Secondary | ICD-10-CM | POA: Diagnosis not present

## 2015-05-08 DIAGNOSIS — E663 Overweight: Secondary | ICD-10-CM | POA: Diagnosis not present

## 2015-05-08 DIAGNOSIS — G549 Nerve root and plexus disorder, unspecified: Secondary | ICD-10-CM | POA: Diagnosis not present

## 2015-05-08 DIAGNOSIS — L700 Acne vulgaris: Secondary | ICD-10-CM | POA: Insufficient documentation

## 2015-05-08 DIAGNOSIS — I10 Essential (primary) hypertension: Secondary | ICD-10-CM | POA: Diagnosis not present

## 2015-05-08 HISTORY — DX: Acne vulgaris: L70.0

## 2015-05-08 MED ORDER — TRAMADOL HCL 50 MG PO TABS: 50.0000 mg | ORAL_TABLET | Freq: Three times a day (TID) | ORAL | Status: DC | PRN

## 2015-05-08 MED ORDER — CLINDAMYCIN PHOS-BENZOYL PEROX 1-5 % EX GEL: Freq: Two times a day (BID) | CUTANEOUS | Status: DC

## 2015-05-08 MED ORDER — MONTELUKAST SODIUM 10 MG PO TABS: 10.0000 mg | ORAL_TABLET | Freq: Every day | ORAL | Status: DC

## 2015-05-08 MED ORDER — TRIAMCINOLONE ACETONIDE 0.1 % EX CREA: TOPICAL_CREAM | Freq: Every day | CUTANEOUS | Status: DC

## 2015-05-08 MED ORDER — MELOXICAM 15 MG PO TABS: 15.0000 mg | ORAL_TABLET | Freq: Every day | ORAL | Status: DC

## 2015-05-08 NOTE — Assessment & Plan Note (Signed)
Established problem Controlled PRN use of triamcinolone cream.

## 2015-05-08 NOTE — Assessment & Plan Note (Addendum)
New problem No further workup Risk factors for postadolescent acne include smoking, African American ethnicity, and stress Combination of comedones and inflammatory papules in facial T-zones  Rx Benzaclin gel twice a day. 2 months daily use to decide efficacy.

## 2015-05-08 NOTE — Assessment & Plan Note (Signed)
Wt Readings from Last 3 Encounters:  05/08/15 165 lb (74.844 kg)  04/09/15 167 lb 6.4 oz (75.932 kg)  04/02/15 169 lb 14.4 oz (77.066 kg)  Body mass index is 26.64 kg/(m^2). down from 29.9%. Pt reports decrease is more from stressful life than from dieting and remaining off Invega and being on Geodon.

## 2015-05-08 NOTE — Assessment & Plan Note (Signed)
Continued adequate BP control off Amlodipine,  on Prazosin at bedtime by [facility].  No new end-organ damage. Continue current medications.  Patient's weight loss of 30 pounds is also likely contributing to improved control.

## 2015-05-08 NOTE — Progress Notes (Signed)
   Subjective:    Patient ID: [name], female    DOB: Feb 26, 1981, 35 y.o.   MRN: [id number] CAITLEN WORTH is alone Sources of clinical information for visit is/are patient and past medical records. Nursing assessment for this office visit was reviewed with the patient for accuracy and revision.    HPI  Problem List Items Addressed This Visit      Medium   HYPERTENSION, BENIGN ESSENTIAL - Primary (Chronic) CHRONIC HYPERTENSION  Disease Monitoring  Blood pressure range: not checking at home  Chest pain: no   Dyspnea: no   Claudication: no   Medication compliance: not taking antihypertensive  Medication Side Effects  Lightheadedness: no   Urinary frequency: no   Edema: no    Preventitive Healthcare:  Exercise: limited walking    Diet Pattern: increased vegetabble  Salt Restriction: no       Low   Overweight (BMI 25.0-29.9) - Longstanding issue - Patient has lost 30 pounds over the last year, "from stress" per patient   Atopic dermatitis (Chronic) - Longstanding - Using triamcinolone crm few times a month on atopic rash on neck - Concern about postinflammatory hyperpigmentation on neck      Unprioritized   Nerve root irritation - Approaching 2 years durtion of left lower back pain with sciatica.  -  Recent steroid epidural injection with CSF leak (low pressure headache) that was treated with blood patch.  Plan for another injection but this time the injection will be subdural per pt.  - Pt to see Dr Venetia Maxon (NS) in early March. - Patient has been contacted by [facility] for TENS unit, but has not yet received unit.  - Pain is slightly less but she is unable to walk a block without pain, bend at waist without pain, transitions in habitus are difficult due to pain./ - Pain interfering with sleep quality and quantity. - Some improvement with Tramadol and with Mobic 15 mg without adverse effects other than mild constipation - Pt is performing Home Back  exercises and stretches which helps to temporarily losing up tight lower back    RASH  Location: facial checks, nose, forehaed Onset: on and off for several years  Course: stable Self-treated with: topical alcohol             Improvement with treatment: nothing  History Pruritis: no  Tenderness: no  New medications/antibiotics: no  Tick/insect/pet exposure: no  Recent travel: no  New detergent, new clothing, or other topical exposure: no   Red Flags Feeling ill: no  Fever: no  Mouth lesions: no  Facial/tongue swelling/difficulty breathing:  no  Diabetic or immunocompromised: no   SH: Smoking (+)  Review of Systems See HPI    Objective:   Physical Exam VS reviewed Nurses notes reviewed and verified GEN: Alert, Cooperative, Groomed, NAD HEENT: macular hyperpigmented patches anterior neck triangle bilaterally, no eczematoid skin.            COR: RRR, No M/G/R LUNGS: BCTA, No Acc mm use SKIN: 2-3 mm erythematous papules and superficial cysts malar distribution some with open comedomes.  Gait: Normal speed, No significant path deviation, Step through +,  Psych: Normal affect/thought/speech/language    Assessment & Plan:

## 2015-05-08 NOTE — Patient Instructions (Signed)
I believe your skin bumps are adult acne.  Recommend starting twice a day Benzaclin gel to T-zone bumps.  Give 2 months for it to really work.   You have lost 30 pounds in about 10 months.  Great job!  Fill the Singulair if your allergies flare up this coming season.   Take at least 800 IU of Vitamin D daily.  Most Multivitamins contain 400 IU of Vitamin D.  You may want to supplement the multipvitamin with an extra Vitamin D table.  Do not exceed 4000 IU of Vitamin D a day.

## 2015-05-08 NOTE — Assessment & Plan Note (Addendum)
Established problem that has improved slightly.  S/P epidural injection by Dr Ethelene Hal ([facility], [facility]) complicated by CSF leak and intracranial hypotension headache treated with a blood patch.  To see Dr Venetia Maxon (NS) for initial consultation in early March, then to see Dr Ethelene Hal for a repeat spinal injection a week later.  [name] is tolerating the Mobic without GI upset or bleeding, Tramadol with only mild constipation. Dr Ethelene Hal is in process of titrating up Gabapentin to 600 mg TID from 300 mg TID.   Awaiting TENS unit from [facility] with coverage by Medicaid.

## 2015-05-14 ENCOUNTER — Telehealth: Payer: Self-pay | Admitting: Family Medicine

## 2015-05-14 NOTE — Telephone Encounter (Signed)
[name] will need to be seen in office to be evaluated.

## 2015-05-14 NOTE — Telephone Encounter (Signed)
[name] need something called to pharmacy.  Have recurrence of symtoms from last visit.

## 2015-05-15 ENCOUNTER — Encounter: Payer: Self-pay | Admitting: Family Medicine

## 2015-05-15 ENCOUNTER — Ambulatory Visit (INDEPENDENT_AMBULATORY_CARE_PROVIDER_SITE_OTHER): Payer: Medicaid Other | Admitting: Family Medicine

## 2015-05-15 VITALS — BP 99/80 | HR 109 | Temp 98.7°F | Wt 158.8 lb

## 2015-05-15 DIAGNOSIS — J028 Acute pharyngitis due to other specified organisms: Secondary | ICD-10-CM

## 2015-05-15 DIAGNOSIS — J3489 Other specified disorders of nose and nasal sinuses: Secondary | ICD-10-CM | POA: Diagnosis present

## 2015-05-15 DIAGNOSIS — R059 Cough, unspecified: Secondary | ICD-10-CM

## 2015-05-15 DIAGNOSIS — B9789 Other viral agents as the cause of diseases classified elsewhere: Secondary | ICD-10-CM

## 2015-05-15 DIAGNOSIS — J029 Acute pharyngitis, unspecified: Secondary | ICD-10-CM

## 2015-05-15 DIAGNOSIS — R05 Cough: Secondary | ICD-10-CM

## 2015-05-15 MED ORDER — IPRATROPIUM BROMIDE 0.06 % NA SOLN: 2.0000 | Freq: Four times a day (QID) | NASAL | Status: DC

## 2015-05-15 MED ORDER — POCKET SPACER DEVI: 1.0000 | Status: DC | PRN

## 2015-05-15 NOTE — Patient Instructions (Signed)
Your symptoms are due to a viral illness. Antibiotics will not help improve your symptoms, but the following will help you feel better while your body fights the virus.   Drink lots of water; Warm tea with honey;  (Guaifenesin "Mucinex" may help)  Nasal Saline Spray for congestion as needed  Congestion:   Nose spray: Atrovent spray 4 times a day as needed  Sneezing & Runny nose: Antihistamines: Zyrtec, Claritin, Allegra  Pain/Sore throat: Tylenol  every 8 hours and / or Ibuprofen 400 mg every 6 hours  Cough:  Albuterol with spacer every 4-6 hours as needed  Wash your hands often to prevent spreading the virus

## 2015-05-15 NOTE — Telephone Encounter (Signed)
Pt was seen in the office today.  

## 2015-05-15 NOTE — Progress Notes (Signed)
   Subjective:    Patient ID: [name], female    DOB: [birth year], 35 y.o.   MRN: [id number]  Seen for Same day visit for   CC: cough, nasal congestion  She reports nasal congestion, runny nose, coughing and overall body aches since Monday, 4 days ago.  This was associated with low-grade fevers and chills.  She has been treating with Tylenol 500 mg every 6 hours when necessary.  Cough has been nonproductive.  She has been using albuterol 1-2 times daily as needed.  She continues to smoke.  Denies nausea, vomiting, diarrhea, rashes, chest pain or difficulty breathing.  Denies headache.   Smoking history noted Objective:  BP 99/80 mmHg  Pulse 109  Temp(Src) 98.7 F (37.1 C) (Oral)  Wt 158 lb 12.8 oz (72.031 kg)  General: NAD HEENT: Nasal turbinates swollen bilaterally, pharyngeal erythema with no exudates, TMs clear bilaterally; no cervical adenopathy.  Thyroid nonpalpable Cardiac: RRR, normal heart sounds, no murmurs. 2+ radial and PT pulses bilaterally Respiratory: CTAB, normal effort Abdomen: soft, nontender, nondistended, no hepatic or splenomegaly. Bowel sounds present Extremities: no edema or cyanosis. WWP. Skin: warm and dry, no rashes noted    Assessment & Plan:   35 year old female smoker with rhinorrhea, cough, nasal congestion and myalgias consistent with upper respiratory virus versus flu.  Afebrile, and starting to improve.  No signs of ear infection, sinus infection or pneumonia.  Mild cough without worsening shortness of breath or purulence to indicate COPD extubation.  We'll treat as her URI with symptomatic therapy.  Advised return precautions as develops new or worsening symptoms  1. Rhinorrhea - ipratropium (ATROVENT) 0.06 % nasal spray; Place 2 sprays into both nostrils 4 (four) times daily.  Dispense: 15 mL; Refill: 12  2. Cough - ipratropium (ATROVENT) 0.06 % nasal spray; Place 2 sprays into both nostrils 4 (four) times daily.  Dispense: 15 mL; Refill:  12 - Spacer/Aero-Holding Chambers (POCKET SPACER) DEVI; 1 Device by Does not apply route as needed.  Dispense: 1 each; Refill: 0  3. Sore throat (viral) - Tylenol, ibuprofen as needed

## 2015-06-18 ENCOUNTER — Other Ambulatory Visit: Payer: Self-pay | Admitting: Family Medicine

## 2015-07-02 ENCOUNTER — Ambulatory Visit (INDEPENDENT_AMBULATORY_CARE_PROVIDER_SITE_OTHER): Payer: Medicaid Other | Admitting: Family Medicine

## 2015-07-02 ENCOUNTER — Other Ambulatory Visit: Payer: Self-pay | Admitting: Family Medicine

## 2015-07-02 ENCOUNTER — Encounter: Payer: Self-pay | Admitting: Family Medicine

## 2015-07-02 VITALS — BP 123/95 | HR 82 | Temp 98.3°F | Wt 159.2 lb

## 2015-07-02 DIAGNOSIS — M222X9 Patellofemoral disorders, unspecified knee: Secondary | ICD-10-CM

## 2015-07-02 MED ORDER — DICLOFENAC SODIUM 75 MG PO TBEC: 75.0000 mg | DELAYED_RELEASE_TABLET | Freq: Two times a day (BID) | ORAL | Status: DC

## 2015-07-02 NOTE — Progress Notes (Signed)
Subjective: [name] Huang is a 35 y.o. female with a history of chronic pain presenting for bilateral knee pain.  She reports gradual onset of bilateral knee pain over the past 1 - 2 weeks. It is constant, worsened by walking, ascending and descending stairs, and located throughout knee worst in the anterior, described as severe and "achy." She denies giving way, locking, but has had some popping sounds for years. Denies injury. She has not recently increased activity and is relative sedentary.   - ROS: As above. - Smoker  Objective: BP 123/95 mmHg  Pulse 82  Temp(Src) 98.3 F (36.8 C) (Oral)  Wt 159 lb 3.2 oz (72.213 kg) Gen: Well-appearing 35 y.o. female in no distress Knees: Identical bilaterally. No erythema, effusion or obvious bony abnormalities. No warmth, patellar and quadriceps tendons are tender. ROM full in flexion and extension with crespitus. Ligaments with solid consistent endpoints including ACL, PCL, LCL, MCL. Negative Mcmurray's. Patellar compression causes pain. Hamstring and quadriceps strength is normal.   Assessment/Plan: [name] [place] is a 35 y.o. female here for patellofemoral pain syndrome.  - NSAIDs (will trial diclofenac as mobic is not helping this pain; cautioned to D/C mobic while taking diclofenac), ice, exercises provided. - If pain continues, could consider U/S of patellar/quadriceps tendons and strap.

## 2015-07-02 NOTE — Patient Instructions (Signed)
Thank you for coming in today!  Try taking diclofenac by mouth INSTEAD of mobic. You should not take these two medications at the same time.   http://[email or web address].[email or web address]/afp/1999/1101/[email or web address].html - This handout was provided today, including exercises to help with pain as tolerated.   Our clinic's number is [phone]. Feel free to call any time with questions or concerns. We will answer any questions after hours with our 24-hour emergency line at that number as well.   - Dr. Jarvis NewcomerGrunz

## 2015-07-09 ENCOUNTER — Ambulatory Visit: Payer: Medicaid Other | Admitting: Family Medicine

## 2015-07-10 ENCOUNTER — Ambulatory Visit (INDEPENDENT_AMBULATORY_CARE_PROVIDER_SITE_OTHER): Payer: Medicaid Other | Admitting: Family Medicine

## 2015-07-10 ENCOUNTER — Encounter: Payer: Self-pay | Admitting: Family Medicine

## 2015-07-10 VITALS — BP 139/100 | HR 85 | Temp 98.2°F | Ht 66.0 in | Wt 158.9 lb

## 2015-07-10 DIAGNOSIS — M7651 Patellar tendinitis, right knee: Secondary | ICD-10-CM | POA: Diagnosis not present

## 2015-07-10 DIAGNOSIS — M7652 Patellar tendinitis, left knee: Secondary | ICD-10-CM | POA: Diagnosis present

## 2015-07-10 NOTE — Patient Instructions (Signed)
Thank you for coming in today!  - Take diclofenac topically and by mouth as needed for pain - Apply ice daily on the lower knee - Try the exercises provided as tolerated - [facility]  Our clinic's number is [phone]. Feel free to call any time with questions or concerns. We will answer any questions after hours with our 24-hour emergency line at that number as well.   - Dr. Jarvis NewcomerGrunz

## 2015-07-10 NOTE — Progress Notes (Signed)
Subjective: Christene LyeShadonna P Pincus is a 35 y.o. female with a history of chronic pain returning for follow up of knee pain.  At our last visit, Mrs. Marykay LexBoler complained of several weeks of gradually worsening bilateral anterior knee pain without abrupt onset or injury without symptoms of significant meniscal injury. Exam pointed toward patellar tendonitis and she was prescribed NSAIDs (po and topical), ice, exercises as tolerated. Today she returns reporting very little improvement from these interventions. She still has significant aching pain in her bilateral knees that at times radiates down into her calves when standing from seated position or traversing stairs.   - ROS: As above. - Smoker  Objective: BP 139/100 mmHg  Pulse 85  Temp(Src) 98.2 F (36.8 C) (Oral)  Ht 5\' 6"  (1.676 m)  Wt 158 lb 14.4 oz (72.077 kg)  BMI 25.66 kg/m2 Gen: Well-appearing 35 y.o. female in no distress Knees: Identical bilaterally without erythema, effusion, warmth, or obvious bony abnormalities. She endorses significant focal pain at both insertions of patellar tendon. No significant medial or lateral joint line tenderness. ROM full in flexion and extension with pain with resisted extension. Ligaments with solid consistent endpoints. Negative Mcmurray's.   Assessment/Plan: Christene LyeShadonna P Tegethoff is a 35 y.o. female here for patellar tendonitis.  - Continue conservative management including topical and po NSAIDs prn, ice, exercises. - [facility].

## 2015-07-15 ENCOUNTER — Encounter: Payer: Self-pay | Admitting: Family Medicine

## 2015-07-15 ENCOUNTER — Ambulatory Visit: Payer: Medicaid Other | Admitting: Family Medicine

## 2015-07-15 ENCOUNTER — Ambulatory Visit (INDEPENDENT_AMBULATORY_CARE_PROVIDER_SITE_OTHER): Payer: Medicaid Other | Admitting: Family Medicine

## 2015-07-15 VITALS — BP 174/110 | HR 70 | Temp 98.4°F | Ht 66.0 in | Wt 153.0 lb

## 2015-07-15 DIAGNOSIS — R51 Headache: Secondary | ICD-10-CM

## 2015-07-15 DIAGNOSIS — R519 Headache, unspecified: Secondary | ICD-10-CM | POA: Insufficient documentation

## 2015-07-15 MED ORDER — MORPHINE SULFATE 10 MG/ML IJ SOLN
2.0000 mg | Freq: Once | INTRAMUSCULAR | Status: AC
  Administered 2015-07-15: 2 mg via INTRAMUSCULAR

## 2015-07-15 MED ORDER — DEXAMETHASONE SODIUM PHOSPHATE 10 MG/ML IJ SOLN
10.0000 mg | Freq: Once | INTRAMUSCULAR | Status: AC
  Administered 2015-07-15: 10 mg via INTRAMUSCULAR

## 2015-07-15 MED ORDER — PROMETHAZINE HCL 25 MG/ML IJ SOLN
12.5000 mg | Freq: Once | INTRAMUSCULAR | Status: AC
  Administered 2015-07-15: 12.5 mg via INTRAMUSCULAR

## 2015-07-15 NOTE — Patient Instructions (Signed)
Her headache is most likely related to migraine versus tension - Your given several injections today that should help relieve your headaches - You may take Tylenol 1000 mg every 8 hours as needed for additional pain relief - Recommend drinking plenty of water and getting good sleep - Follow-up in the next 1-2 days for blood pressure recheck

## 2015-07-15 NOTE — Progress Notes (Signed)
   Subjective:    Patient ID: [name], female    DOB: 28-Nov-1980, 35 y.o.   MRN: [id number]  Seen for Same day visit for   CC: Headache  She reports headaches for the past 2 weeks.  Describes headaches as bilateral temporal stabbing.  She associates these headaches with the recent death in the family causing stress.  Headaches associated with some nausea without vomiting.  Denies sensitivity to light or sound.  Denies any trauma, fevers, numbness/tingling/weakness in extremities.  She reports upcoming back surgery on Friday was advised not to take NSAIDs within 7 days of surgery.  Previously was using Voltaren for knee pain on a regular basis but stopped this Friday.   Associated Symptoms  Nausea/vomiting: yes  Photophobia/phonophobia: no  Auras: no  Rhinorrhea, Eye injection or lacrimation; Facial flushing/sweating/edema: no  URI symptoms & Sinus pressure: no  Jaw claudication; polymyalgia rheumatica no  HTN: yes  Red Flags  New type of headache / Onset after age 35: no  Fever; Neck Stiffness; Diplopia; Unilateral weakness; AMS: no  Weight loss: no  Trauma: no  Illicit drug use: no  Anticoagulant use: no  H/o cancer / HIV / immunosuppressive: no  Pregnancy: no  Smoking history noted  Review of Systems   See HPI for ROS. Objective:  BP 174/110 mmHg  Pulse 70  Temp(Src) 98.4 F (36.9 C) (Oral)  Ht 5\' 6"  (1.676 m)  Wt 153 lb (69.4 kg)  BMI 24.71 kg/m2  SpO2 100%  LMP 06/24/2015  General: NAD Cardiac: RRR, normal heart sounds, no murmurs. 2+ radial and PT pulses bilaterally Respiratory: CTAB, normal effort Neuro: PERRLA, EOMI, CN 3-12 intact; gross sensory and motor intact  Assessment & Plan:   Headache Intermittent daily headaches for the past 2 weeks, most consistent with migraine versus tension headache.  Due to upcoming back surgery will avoid NSAIDs - Decadron 10 mg IM; Phenergan 12.5 mg IM; morphine 2 mg IM - Recommended good oral  hydration and sleep - Follow-up in 1-2 days for blood pressure recheck when not pain

## 2015-07-15 NOTE — Assessment & Plan Note (Signed)
Intermittent daily headaches for the past 2 weeks, most consistent with migraine versus tension headache.  Due to upcoming back surgery will avoid NSAIDs - Decadron 10 mg IM; Phenergan 12.5 mg IM; morphine 2 mg IM - Recommended good oral hydration and sleep - Follow-up in 1-2 days for blood pressure recheck when not pain

## 2015-07-16 ENCOUNTER — Telehealth: Payer: Self-pay | Admitting: *Deleted

## 2015-07-16 ENCOUNTER — Ambulatory Visit (INDEPENDENT_AMBULATORY_CARE_PROVIDER_SITE_OTHER): Payer: Medicaid Other | Admitting: *Deleted

## 2015-07-16 VITALS — BP 130/90 | HR 90

## 2015-07-16 DIAGNOSIS — Z0130 Encounter for examination of blood pressure without abnormal findings: Secondary | ICD-10-CM

## 2015-07-16 DIAGNOSIS — Z136 Encounter for screening for cardiovascular disorders: Secondary | ICD-10-CM

## 2015-07-16 NOTE — Progress Notes (Signed)
   Pt in nurse clinic for blood pressure check.  Patient is still having a headache. Per patient the headache is not as bad compared to yesterday.  Patient denies chest pain, SOB, dizziness or visual changes.  Patient recent had her eye exam 2-3 months ago.  Patient stated she took an amlodipine 5 mg tablet today to help with her blood pressure.  It is an old prescription that she did not get rid of.  Per patient she was taken off her blood pressure medications about 1-2 years ago.  Will forward to PCP.  Clovis PuMartin, Lenorris Karger L, RN Today's Vitals   07/16/15 1148  BP: 130/90  Pulse: 90

## 2015-07-16 NOTE — Progress Notes (Signed)
That's fine. She can continue to take Tylenol 1000mg  TID prn. She can also continue amlodipine 5mg  qd. Make apt in the next 1-2 weeks for HTN, or sooner if she develops new or concerning symptoms.

## 2015-07-16 NOTE — Telephone Encounter (Signed)
Patient informed that she could take Tylenol 1000 mg TID as needed, continue amlodipine 5 mg daily and make an appointment 1-2 weeks for hypertension.  Appt 07/30/15 at 9:45 AM.  Clovis PuMartin, Jonmarc Bodkin L, RN

## 2015-07-16 NOTE — Telephone Encounter (Signed)
-----   Message from Jamal CollinJames R Joyner, MD sent at 07/16/2015  2:30 PM EDT -----   ----- Message -----    From: Clovis Puamika L Mendell Bontempo, RN    Sent: 07/16/2015  12:03 PM      To: Jamal CollinJames R Joyner, MD

## 2015-07-17 ENCOUNTER — Ambulatory Visit: Payer: Medicaid Other | Admitting: Family Medicine

## 2015-07-30 ENCOUNTER — Encounter: Payer: Self-pay | Admitting: Family Medicine

## 2015-07-30 ENCOUNTER — Ambulatory Visit (INDEPENDENT_AMBULATORY_CARE_PROVIDER_SITE_OTHER): Payer: Medicaid Other | Admitting: Family Medicine

## 2015-07-30 VITALS — BP 112/74 | HR 80 | Temp 98.3°F | Ht 66.0 in | Wt 152.0 lb

## 2015-07-30 DIAGNOSIS — I10 Essential (primary) hypertension: Secondary | ICD-10-CM | POA: Diagnosis not present

## 2015-07-30 NOTE — Assessment & Plan Note (Signed)
Blood pressure is under excellent control today.  Previous elevated blood pressures, most likely related to headache.  - Provided note clearing her for her orthopedic back procedure.  Recommended she stop NSAIDs 1 week prior to procedure - Follow-up with PCP in 2-3 months for BP recheck

## 2015-07-30 NOTE — Progress Notes (Signed)
  Patient name: [name] MRN [id number]  Date of birth: 1981-02-08  CC & HPI:  [name] is a 35 y.o. female presenting today for HTN.  - She reports complete resolution of her previous headaches - Continues to endorse her chronic knee and back pain for which she takes meloxicam - Reports she needs a letter today cleared her for orthopedic back procedure, that was postponed due to her elevated blood pressure last visit.   Smoking History Noted  Objective Findings:  Vitals: BP 112/74 mmHg  Pulse 80  Temp(Src) 98.3 F (36.8 C) (Oral)  Ht 5\' 6"  (1.676 m)  Wt 152 lb (68.947 kg)  BMI 24.55 kg/m2  SpO2 96%  LMP 06/24/2015  Gen: NAD CV: RRR w/o m/r/g, pulses +2 b/l Resp: CTAB w/ normal respiratory effort  Assessment & Plan:   HYPERTENSION, BENIGN ESSENTIAL Blood pressure is under excellent control today.  Previous elevated blood pressures, most likely related to headache.  - Provided note clearing her for her orthopedic back procedure.  Recommended she stop NSAIDs 1 week prior to procedure - Follow-up with PCP in 2-3 months for BP recheck

## 2015-07-31 ENCOUNTER — Encounter: Payer: Self-pay | Admitting: Family Medicine

## 2015-07-31 ENCOUNTER — Ambulatory Visit (INDEPENDENT_AMBULATORY_CARE_PROVIDER_SITE_OTHER): Payer: Medicaid Other | Admitting: Family Medicine

## 2015-07-31 ENCOUNTER — Ambulatory Visit
Admission: RE | Admit: 2015-07-31 | Discharge: 2015-07-31 | Disposition: A | Discharge status: Home / Self Care | Payer: Medicaid Other | Source: Ambulatory Visit | Attending: Family Medicine | Admitting: Family Medicine

## 2015-07-31 ENCOUNTER — Other Ambulatory Visit: Payer: Self-pay | Admitting: Family Medicine

## 2015-07-31 VITALS — BP 120/88 | Ht 66.0 in | Wt 152.0 lb

## 2015-07-31 DIAGNOSIS — M25561 Pain in right knee: Secondary | ICD-10-CM

## 2015-07-31 DIAGNOSIS — M25562 Pain in left knee: Principal | ICD-10-CM

## 2015-07-31 NOTE — Progress Notes (Signed)
  [name] - 35 y.o. female MRN [id number]  Date of birth: Sep 06, 1980 [name] is a 35 y.o. female who presents today for B/L Knee pain.  B/L Knee pain, initial visit 07/31/15 - patient presents today with ongoing bilateral anterior knee pain. This is worse with prolonged sitting or ascending/descending stairs or going up or down hills. She has previously tried anti-inflammatories with some relief but continues to have pain. This is been ongoing issue now for several years but has worsened over the past several months. Denies inciting injury or previous injury to either knee. There is no locking catching giving way or instability. She occasionally does notice a click in the left knee. No swelling at this time.   PMHx - Updated and reviewed.  Contributory factors include: Asthma and low back pain  PSHx - Updated and reviewed.  Contributory factors include:  Strabismus surgery  FHx - Updated and reviewed.  Contributory factors include:  Heart disease father  Social Hx - Updated and reviewed. Contributory factors include: Current smoker one fourth pack per day  Medications - reviewed   ROS Per HPI.  12 point negative other than per HPI.   Exam:  Filed Vitals:   07/31/15 1130  BP: 120/88   Gen: NAD, AAO 3 Cardio- RRR Pulm - Normal respiratory effort/rate Skin: No rashes or erythema Extremities: No edema  Vascular: pulses +2 bilateral upper and lower extremity Psych: Normal affect  B/L Knee: Normal to inspection with no erythema or effusion or obvious bony abnormalities. Palpation normal with no warmth or joint line tenderness or patellar tenderness or condyle tenderness. ROM normal in flexion and extension and lower leg rotation. Ligaments with solid consistent endpoints including ACL, PCL, LCL, MCL. Negative Mcmurray's and provocative meniscal tests. + painful patellar compression with + J sign and + 1 crepitus  Patellar and quadriceps tendons unremarkable. Hamstring and  quadriceps strength is normal.

## 2015-07-31 NOTE — Assessment & Plan Note (Signed)
Most likely patellofemoral pain syndrome with probable chondromalacia. We will also obtain 3 view bilateral knee x-rays with sunrise to evaluate her patellofemoral joint to make sure that she does not have underlying osteoarthritic changes. -Continue with Modic and start Voltaren gel 3 times a day to 4 times a day daily. -Physical therapy 1 to give home exercise program for patellofemoral pain syndrome as well as hip strengthening/core strengthening exercises. -Follow-up in 4 weeks to see how she is doing. At that point I will consider corticosteroid injections. -She does have some tethering of her knee Which is a concern that this could become a surgical issue sooner than later. We will see what her x-rays show and her follow-up after conservative management will further determine where we go from here.

## 2015-08-04 ENCOUNTER — Telehealth: Payer: Self-pay | Admitting: *Deleted

## 2015-08-04 NOTE — Telephone Encounter (Signed)
-----   Message from Henri MedalJazmin M Hartsell, [place]MA sent at 07/25/2015 12:18 PM EDT ----- Patient called to file a complaint against Dr. Jarvis NewcomerGrunz who she saw on 07/10/15.  Patient states that she was here for knee pain and the provider never examined her.  Also she thinks that the medication he gave her that day was the cause of her blood pressure increase.  Patient was also upset because she was never contacted about her sports medicine referral and when she called them today was told it was never placed.  Patient has made an appt to be seen but would like this information to be known.  Also she doesn't want to see this provider again. Jazmin Hartsell,CMA

## 2015-08-04 NOTE — Telephone Encounter (Signed)
Patient stopped by my office on way out from recent office visit.  Stated same info that was relayed to Jacobs EngineeringJazmin Hartsell, CMA.  Patient is upset because she Dr. Jarvis NewcomerGrunz "did not examine my knee" and "forgot" to put in her sports medicine referral.   Patient would like a call back to discuss her concern.  Will forward note to Drs. Ruffin FrederickGrunz, [place] (Advisor), and [facility] Center[facility] Multimedia programmer(Medical Director).  Altamese Dilling~Jeannette Richardson, BSN, RN-BC

## 2015-08-05 ENCOUNTER — Ambulatory Visit: Payer: Medicaid Other | Attending: Family Medicine | Admitting: Physical Therapy

## 2015-08-05 ENCOUNTER — Encounter: Payer: Self-pay | Admitting: Family Medicine

## 2015-08-05 DIAGNOSIS — M6281 Muscle weakness (generalized): Secondary | ICD-10-CM

## 2015-08-05 DIAGNOSIS — M25562 Pain in left knee: Secondary | ICD-10-CM | POA: Diagnosis present

## 2015-08-05 DIAGNOSIS — R252 Cramp and spasm: Secondary | ICD-10-CM | POA: Diagnosis present

## 2015-08-05 DIAGNOSIS — M25661 Stiffness of right knee, not elsewhere classified: Secondary | ICD-10-CM | POA: Diagnosis present

## 2015-08-05 DIAGNOSIS — R2689 Other abnormalities of gait and mobility: Secondary | ICD-10-CM | POA: Diagnosis present

## 2015-08-05 DIAGNOSIS — M25561 Pain in right knee: Secondary | ICD-10-CM | POA: Diagnosis present

## 2015-08-05 NOTE — Progress Notes (Signed)
Patient ID: [name], female   DOB: 1980/10/06, 35 y.o.   MRN: [id number] Spoke with patient regarding her issues about office visit on 07/10/2015. She has been seen by sports medicine. I apologized for the delay in getting her referral /appointment made. I will give her feedback to Dr. Jarvis NewcomerGrunz.
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
## 2015-08-05 NOTE — Telephone Encounter (Signed)
I'm sorry to hear Ms. Denslow had a poor experience. I certainly did examine her knee and [facility]. I will discuss this with Dr. Jennette KettleNeal today.

## 2015-08-14 ENCOUNTER — Encounter: Payer: Self-pay | Admitting: Family Medicine

## 2015-08-14 ENCOUNTER — Ambulatory Visit (INDEPENDENT_AMBULATORY_CARE_PROVIDER_SITE_OTHER): Payer: Medicaid Other | Admitting: Family Medicine

## 2015-08-14 VITALS — BP 126/95 | HR 90 | Temp 98.3°F | Wt 157.0 lb

## 2015-08-14 DIAGNOSIS — I10 Essential (primary) hypertension: Secondary | ICD-10-CM | POA: Diagnosis present

## 2015-08-14 DIAGNOSIS — L209 Atopic dermatitis, unspecified: Secondary | ICD-10-CM

## 2015-08-14 DIAGNOSIS — G549 Nerve root and plexus disorder, unspecified: Secondary | ICD-10-CM

## 2015-08-14 DIAGNOSIS — M25861 Other specified joint disorders, right knee: Secondary | ICD-10-CM | POA: Diagnosis not present

## 2015-08-14 NOTE — Patient Instructions (Addendum)
Your next appointment for Sports Medicine is on 09/02/16 at 1:30 pm.  Good BP control, continue your Amolodipine 5 mg every day. I agree with the diagnosis of patellofemoral syndrome (runner's knee) Strengthening the inside muscle of the thigh will help properly align your knee cap and lessen pain.  This can take months of exercise to get the inner thigh muscle develped.  Keep at it!!.     Patellofemoral Pain Syndrome Patellofemoral pain syndrome is a condition that involves a softening or breakdown of the tissue (cartilage) on the underside of your kneecap (patella). This causes pain in the front of the knee. The condition is also called runner's knee or chondromalacia patella. Patellofemoral pain syndrome is most common in young adults who are active in sports. Your knee is the largest joint in your body. The patella covers the front of your knee and is attached to muscles above and below your knee. The underside of the patella is covered with a smooth type of cartilage (synovium). The smooth surface helps the patella glide easily when you move your knee. Patellofemoral pain syndrome causes swelling in the joint linings and bone surfaces in your knee.  CAUSES  Patellofemoral pain syndrome can be caused by:  Overuse.  Poor alignment of your knee joints.  Weak leg muscles.  A direct blow to your kneecap. RISK FACTORS You may be at risk for patellofemoral pain syndrome if you:  Do a lot of activities that can wear down your kneecap. These include:  Running.  Squatting.  Climbing stairs.  Start a new physical activity or exercise program.  Wear shoes that do not fit well.  Do not have good leg strength.  Are overweight. SIGNS AND SYMPTOMS  Knee pain is the most common symptom of patellofemoral pain syndrome. This may feel like a dull, aching pain underneath your patella, in the front of your knee. There may be a popping or cracking sound when you move your knee. Pain may get worse  with:  Exercise.  Climbing stairs.  Running.  Jumping.  Squatting.  Kneeling.  Sitting for a long time.  Moving or pushing on your patella. DIAGNOSIS  Your health care provider may be able to diagnose patellofemoral pain syndrome from your symptoms and medical history. You may be asked about your recent physical activities and which ones cause knee pain. Your health care provider may do a physical exam with certain tests to confirm the diagnosis. These may include:  Moving your patella back and forth.  Checking your range of knee motion.  Having you squat or jump to see if you have pain.  Checking the strength of your leg muscles. An MRI of the knee may also be done. TREATMENT  Patellofemoral pain syndrome can usually be treated at home with rest, ice, compression, and elevation (RICE). Other treatments may include:  Nonsteroidal anti-inflammatory drugs (NSAIDs).  Physical therapy to stretch and strengthen your leg muscles.  Shoe inserts (orthotics) to take stress off your knee.  A knee brace or knee support.  Surgery to remove damaged cartilage or move the patella to a better position. The need for surgery is rare. HOME CARE INSTRUCTIONS   Take medicines only as directed by your health care provider.  Rest your knee.  When resting, keep your knee raised above the level of your heart.  Avoid activities that cause knee pain.  Apply ice to the injured area:  Put ice in a plastic bag.  Place a towel between your skin and  the bag.  Leave the ice on for 20 minutes, 2-3 times a day.  Use splints, braces, knee supports, or walking aids as directed by your health care provider.  Perform stretching and strengthening exercises as directed by your health care provider or physical therapist.  Keep all follow-up visits as directed by your health care provider. This is important. SEEK MEDICAL CARE IF:   Your symptoms get worse.  You are not improving with home  care. MAKE SURE YOU:  Understand these instructions.  Will watch your condition.  Will get help right away if you are not doing well or get worse.   This information is not intended to replace advice given to you by your health care provider. Make sure you discuss any questions you have with your health care provider.   Document Released: 02/17/2009 Document Revised: 03/22/2014 Document Reviewed: 05/21/2013 Elsevier Interactive Patient Education [employer]

## 2015-08-15 ENCOUNTER — Encounter: Payer: Self-pay | Admitting: Family Medicine

## 2015-08-15 DIAGNOSIS — M25861 Other specified joint disorders, right knee: Secondary | ICD-10-CM

## 2015-08-15 HISTORY — DX: Other specified joint disorders, right knee: M25.861

## 2015-08-15 NOTE — Assessment & Plan Note (Signed)
Established problem Fair control. Resultant hyperpigmentation at neck with frequent relapses on current regiment oftopical costicosteroids Continue HC crm OTC and Triamcinolone crm Rx prn Patient requesting consultation with Dermatology.  Will make referral.

## 2015-08-15 NOTE — Assessment & Plan Note (Signed)
Established problem Controlled After consultation with Dr Venetia MaxonStern (NS), Ms [name] has opted for combination of "nerve block" and facet joint injections over surgery. Pt [facility] [place].

## 2015-08-15 NOTE — Assessment & Plan Note (Signed)
Adequate blood pressure control.  No evidence of new end organ damage.  Tolerating medication without significant adverse effects.  Plan to continue current blood pressure regiment.   

## 2015-08-15 NOTE — Assessment & Plan Note (Signed)
Established problem Improving.  Pt able to complete her ADLs and iADLs.  Conservative mangement with HEP, NSAIDS. Pt to follow up with SM in about a month where corticosteroid injections are being considered.

## 2015-08-15 NOTE — Progress Notes (Signed)
   Subjective:    Patient ID: [name], female    DOB: Nov 28, 1980, 35 y.o.   MRN: [id number]  HPI Problem List Items Addressed This Visit      Medium   HYPERTENSION, BENIGN ESSENTIAL - Primary (Chronic) CHRONIC HYPERTENSION  Disease Monitoring  Blood pressure range: home monitor 120's over 70s to 80  Chest pain: no   Dyspnea: no   Claudication: no   Medication compliance: yes  Medication Side Effects  Lightheadedness: no   Urinary frequency: no   Edema: no    Preventitive Healthcare:  Exercise: no   Diet Pattern: low salt        Low   Atopic dermatitis (Chronic) - Recurrence in adulthood.  Present for several years with episodes of flaring up - [facility] and Rx triamcinolone with fair control - - residual hyperpigmentalion of skin is bothersome to patient. - itching is adequately contolled with current regiment.  No fevers , no new sites of rash.      Unprioritized   Patellofemoral dysfunction of right knee Onset: one month ago Location: right anterior knee pain Quality: aching pain with intermittent sharp pain when first initiating extension of knee after prolonged sittting Severity: moderate Function: able to perform ADLs and iADLs Pattern: constant Course: stable Radiation: no Relief: Norco she receives from Neurosurgery for her spinal column problems Precipitant: no acute precipitant noted,  Associated Symptoms: no warmth in knee, some similar pain in left knee at times.  Trauma (Acute or Chronic): none Prior Diagnostic Testing or Treatments: R knee Xrays with mild medial compartment narrowing Relevant PMH/PSH: Has seen [facility] for complaint who concern for patella chondromalacia. Recommended continuation of Mobic and Voltaren gel, HEP from PT visit.  Follow up with SM - may get joint inj.     Nerve root irritation - Lumbar disc disease - Pt has had epidural corticosteroid with CSF leak, then "nerve block" injection, the  bilateral fascet [facility], PM&R.  - Able to perform ADLs and iADLs - pain into legs is less, no difficulty with bowel, no difficulty with bladder.       Smoking 1/4 pack per day.   Review of Systems See HPI     Objective:   Physical Exam VS reviewed GEN: Alert, Cooperative, Groomed, NAD COR: RRR, No M/G/R, No JVD, Normal PMI size and location LUNGS: BCTA, No Acc mm use, speaking in full sentences  EXT: No peripheral leg edema. Feet without deformity or lesions. Palpable bilateral pedal pulses.  Right knee:  No peripatella edema   right Knee General Effusion: absent Erythema: absent Increased Warmth: absent  Tenderness: Lateral joint line  Maneuvers for ACL tear Anterior Drawer: absent  Maneuver for meniscus tear McMurray: absent Tender joint line: yes, lateral joint line  no collateral ligament laxity and positive patella grind test   Range of Motion: not limited Nonatalgic gait present  SKIN: general hyperpigmentation at nape of neck Psych: Normal affect/thought/speech/language    Assessment & Plan:

## 2015-09-01 ENCOUNTER — Other Ambulatory Visit: Payer: Self-pay | Admitting: Family Medicine

## 2015-09-03 ENCOUNTER — Ambulatory Visit (INDEPENDENT_AMBULATORY_CARE_PROVIDER_SITE_OTHER): Payer: Medicaid Other | Admitting: Family Medicine

## 2015-09-03 ENCOUNTER — Encounter: Payer: Self-pay | Admitting: Family Medicine

## 2015-09-03 VITALS — BP 130/92 | HR 77 | Ht 66.0 in | Wt 157.0 lb

## 2015-09-03 DIAGNOSIS — M25562 Pain in left knee: Secondary | ICD-10-CM

## 2015-09-03 DIAGNOSIS — M25561 Pain in right knee: Secondary | ICD-10-CM | POA: Diagnosis not present

## 2015-09-03 NOTE — Assessment & Plan Note (Signed)
Patient improving with conservative measures I would continue this for now. Continue topical medication as well as VMO strengthening for underlying patellofemoral pain syndrome. She does have evidence of some chondral thinning on x-ray but very insubstantial. -Follow-up as needed or if it is interested in injections.

## 2015-09-03 NOTE — Progress Notes (Signed)
  [name] - 35 y.o. female MRN [id number]  Date of birth: [birth year] [name] is a 35 y.o. female who presents today for B/L Knee pain.  B/L Knee pain, initial visit 07/31/15 - patient presents today with ongoing bilateral anterior knee pain. This is worse with prolonged sitting or ascending/descending stairs or going up or down hills. She has previously tried anti-inflammatories with some relief but continues to have pain. This is been ongoing issue now for several years but has worsened over the past several months. Denies inciting injury or previous injury to either knee. There is no locking catching giving way or instability. She occasionally does notice a click in the left knee. No swelling at this time.   Follow-up bilateral knee pain 09/03/15-patient feels about 50-70% better today. Has been doing exercises that she got from physical therapy as well as topical/by mouth anti-inflammatories. She denies acute pain or pain during activities of daily living at this point. No new symptoms otherwise. X-rays performed on 07/31/15 show minimal joint space narrowing with very minimal chondral thinning at the patellofemoral joint.  PMHx - Updated and reviewed.  Contributory factors include: Asthma and low back pain  PSHx - Updated and reviewed.  Contributory factors include:  Strabismus surgery  FHx - Updated and reviewed.  Contributory factors include:  Heart disease father  Social Hx - Updated and reviewed. Contributory factors include: Current smoker one fourth pack per day  Medications - reviewed   ROS Per HPI.  12 point negative other than per HPI.   Exam:  Filed Vitals:   09/03/15 1340  BP: 130/92  Pulse: 77   Gen: NAD, AAO 3 Cardio- RRR Pulm - Normal respiratory effort/rate Skin: No rashes or erythema Extremities: No edema  Vascular: pulses +2 bilateral upper and lower extremity Psych: Normal affect  B/L Knee: Normal to inspection with no erythema or effusion or obvious  bony abnormalities. Palpation normal with no warmth or joint line tenderness or patellar tenderness or condyle tenderness. ROM normal in flexion and extension and lower leg rotation. Ligaments with solid consistent endpoints including ACL, PCL, LCL, MCL. Negative Mcmurray's and provocative meniscal tests. + painful patellar compression with + J sign and + 1 crepitus  Patellar and quadriceps tendons unremarkable. Hamstring and quadriceps strength is normal.  Imaging: 07/31/2015 bilateral standing knees shows minimal joint space narrowing with a little chondral thinning the patellofemoral joint on sunrise view.

## 2015-09-08 ENCOUNTER — Encounter: Payer: Self-pay | Admitting: Family Medicine

## 2015-09-08 DIAGNOSIS — G894 Chronic pain syndrome: Secondary | ICD-10-CM

## 2015-09-08 DIAGNOSIS — M5136 Other intervertebral disc degeneration, lumbar region: Secondary | ICD-10-CM

## 2015-09-08 DIAGNOSIS — M51369 Other intervertebral disc degeneration, lumbar region without mention of lumbar back pain or lower extremity pain: Secondary | ICD-10-CM | POA: Insufficient documentation

## 2015-09-08 HISTORY — DX: Other intervertebral disc degeneration, lumbar region without mention of lumbar back pain or lower extremity pain: M51.369

## 2015-09-08 HISTORY — DX: Chronic pain syndrome: G89.4

## 2015-09-08 HISTORY — DX: Other intervertebral disc degeneration, lumbar region: M51.36

## 2015-09-11 ENCOUNTER — Other Ambulatory Visit: Payer: Self-pay | Admitting: Family Medicine

## 2015-09-17 ENCOUNTER — Ambulatory Visit (INDEPENDENT_AMBULATORY_CARE_PROVIDER_SITE_OTHER): Payer: Medicaid Other | Admitting: Licensed Clinical Social Worker

## 2015-09-17 DIAGNOSIS — Z638 Other specified problems related to primary support group: Secondary | ICD-10-CM

## 2015-09-17 DIAGNOSIS — F439 Reaction to severe stress, unspecified: Secondary | ICD-10-CM

## 2015-09-17 NOTE — Progress Notes (Signed)
Patient ID: [name], female   DOB: 1980-04-05, 35 y.o.   MRN: [id number] [facility]. McDiarmid for stressful home situation.     Presenting Issue: Patient lives with her husband of 10 year and 3 children 8616, 6611 & 8. House hold income is child support, husband's disability income and food stamps. Stressors include her relationship, finances and teenage daughter.  Report of symptoms:  feeling depressed and tearful. Symptoms currently impacting activities with her children making it extremely difficult to take care of things at home.  [facility] 09/17/2015 09/03/2015 08/14/2015  Decreased Interest 3 0 0  Down, Depressed, Hopeless 3 0 0  PHQ - 2 Score 6 0 0  Altered sleeping 2 - -  Tired, decreased energy 2 - -  Change in appetite 1 - -  Feeling bad or failure about yourself  3 - -  Trouble concentrating 1 - -  Moving slowly or fidgety/restless 1 - -  Suicidal thoughts 2 - -  PHQ-9 Score 18 - -  Difficult doing work/chores Extremely dIfficult - -  PHQ score of 18, moderate to major depression   Assessment Patient states has history of depression for the past two years, however stated 0 during her last two visits.  Patient states she noticed an increase in her depression over the past year since her father passed.  Patient denies SI, HI and substance use, reports history of SI and substance use, last SI in 2014 and drinking in 2016, denies any drug use, reports one psych inpatient hospitalization in 2014, last outpatient therapy with [facility] in 2016 for about 6 months, [facility]. Support system are her church and mother, both are very limited.  Previous support system was her father that passed away last year.   Plan/Recommendations:   Patient would benefit from counseling to manage her ongoing mental health. Patient has an appointment with [facility] this month.  She is in agreement to schedule an appointment to [facility] a  [facility].   [facility]  to provide brief therapeutic interventions to assist with coping skills and managing her stressors, until [facility] is [facility]. [facility] symptoms and establish goals. Patient's treatment plan was discussed and goals were established.  Interventions will include a combination of Motivational Interviewing, Cognitive Behavioral therapy, Problem Solving and Behavior Activation.  Treatment will focused on establishing a therapeutic relationship, psycho-education, identifying presenting problems, relaxation techniques and coping skills for stressors.       Patient's Goals:  1. Become mentally stable 2. Happy Place for self 3. Feel confident in self Patient is in agreement to return to [facility] clinic next week to work on goals to assist with managing stress.    [name]. LCSW Clinical Social Work, [facility]   [phone] 3:11 PM

## 2015-09-18 ENCOUNTER — Ambulatory Visit: Payer: Medicaid Other | Admitting: Family Medicine

## 2015-09-19 ENCOUNTER — Encounter: Payer: Self-pay | Admitting: Family Medicine

## 2015-09-19 ENCOUNTER — Ambulatory Visit (INDEPENDENT_AMBULATORY_CARE_PROVIDER_SITE_OTHER): Payer: Medicaid Other | Admitting: Family Medicine

## 2015-09-19 VITALS — BP 129/91 | HR 78 | Temp 98.3°F | Wt 168.0 lb

## 2015-09-19 DIAGNOSIS — N644 Mastodynia: Secondary | ICD-10-CM

## 2015-09-19 NOTE — Patient Instructions (Signed)
Fibrocystic Breast Changes °Fibrocystic breast changes occur when breast ducts become blocked, causing painful, fluid-filled lumps (cysts) to form in the breast. This is a common condition that is noncancerous (benign). It occurs when women go through hormonal changes during their menstrual cycle. Fibrocystic breast changes can affect one or both breasts. °CAUSES  °The exact cause of fibrocystic breast changes is not known, but it may be related to the female hormones estrogen and progesterone. Family traits that get passed from parent to child (genetics) may also be a factor in some cases. °SIGNS AND SYMPTOMS  °· Tenderness, mild discomfort, or pain.   °· Swelling.   °· Rope-like feeling when touching the breast.   °· Lumpy breast, one or both sides.   °· Changes in breast size, especially before (larger) and after (smaller) the menstrual period.   °· Green or dark brown nipple discharge (not blood).   °Symptoms are usually worse before menstrual periods start and get better toward the end of the menstrual period.  °DIAGNOSIS  °To make a diagnosis, your health care provider will ask you questions and perform a physical exam of your breasts. The health care provider may recommend other tests that can examine inside your breasts, such as: °· A breast X-ray (mammogram).   °· Ultrasonography.  °· An MRI.   °If something more than fibrocystic breast changes is suspected, your health care provider may take a breast tissue sample (breast biopsy) to examine. °TREATMENT  °Often, treatment is not needed. Your health care provider may recommend over-the-counter pain relievers to help lessen pain or discomfort caused by the fibrocystic breast changes. You may also be asked to change your diet to limit or stop eating foods or drinking beverages that contain caffeine. Foods and beverages that contain caffeine include chocolate, soda, coffee, and tea. Reducing sugar and fat in your diet may also help. Your health care provider  may also recommend: °· Fine needle aspiration to remove fluid from a cyst that is causing pain.   °· Surgery to remove a large, persistent, and tender cyst. °HOME CARE INSTRUCTIONS  °· Examine your breasts after every menstrual period. If you do not have menstrual periods, check your breasts the first day of every month. Feel for changes, such as more tenderness, a new growth, a change in breast size, or a change in a lump that has always been there.   °· Only take over-the-counter or prescription medicine as directed by your health care provider.   °· Wear a well-fitted support or sports bra, especially when exercising.   °· Decrease or avoid caffeine, fat, and sugar in your diet as directed by your health care provider.   °SEEK MEDICAL CARE IF:  °· You have fluid leaking (discharge) from your nipples, especially bloody discharge.   °· You have new lumps or bumps in the breast.   °· Your breast or breasts become enlarged, red, and painful.   °· You have areas of your breast that pucker in.   °· Your nipples appear flat or indented.   °  °This information is not intended to replace advice given to you by your health care provider. Make sure you discuss any questions you have with your health care provider. °  °Document Released: 12/16/2005 Document Revised: 11/20/2014 Document Reviewed: 08/20/2012 °Elsevier Interactive Patient Education ©2016 [employer] ° ° °

## 2015-09-19 NOTE — Assessment & Plan Note (Addendum)
The patient is presenting with progressively worsening breast tenderness and reports of nodules over the last 2 months. Her symptoms sound suggestive of fibrocystic breast disease, however she denies a cyclic pattern to her pain.  She does not completely fit the history of fibrocystic breast disease, I would feel more comfortable obtaining a diagnostic mammogram and ultrasound. The patient is agreement.

## 2015-09-19 NOTE — Progress Notes (Signed)
Subjective: CC: Breast Tenderness HPI: Patient is a 35 y.o. female with a past medical history of GERD, GAD, schizoaffective disorder, chronic pain syndrome, HTN presenting to clinic today for a same day appt for bilateral breast pain.  Patient notes diffuse breast pain for the last 2 months that has progressively been getting worse.  She notes the breast tenderness is present constantly, however is worsened when she takes off her brassiere.  She notes knots in both breasts that are present all the time, not just surrounding her period. She notes that she has an irregular period at baseline.  No discharge from her nipples.   She notes that she had "dry milk" in her breast ducts previously which caused pain.  She was last pregnant in 2014. From looking over the EMR, she had a mammogram in 2013 in addition, an ultrasound which did not reveal any malignancy.   LMP: 09/01/15, irregular since her fallopian tubes were removed; she had tubes tied, then subsequently became pregnant and then had her tubes removed completely per her report. She had an aunt who had a breast cancer in her late 2530s, her mother had uterine cancer in her 30s. She is unaware of any family history of ovarian cancer  Social History: Married   ROS: All other systems reviewed and are negative besides that noted in HPI.  Past Medical History Patient Active Problem List   Diagnosis Date Noted  . Breast tenderness 09/19/2015  . Degeneration of lumbar intervertebral disc 09/08/2015  . Chronic pain syndrome 09/08/2015  . Patellofemoral dysfunction of right knee 08/15/2015  . Bilateral anterior knee pain 07/31/2015  . Headache 07/15/2015  . Adult acne 05/08/2015  . Nerve root irritation 04/28/2015  . Loss of lumbosacral lordosis 03/25/2015  . Myofascial pain on left side 03/25/2015  . Low back pain with left-sided sciatica 03/25/2015  . Lumbosacral facet joint syndrome 03/25/2015  . Overweight (BMI 25.0-29.9) 07/20/2012    . Atopic dermatitis 04/07/2012  . Schizoaffective disorder (HCC)   . Chronic right shoulder pain 12/14/2011  . Generalized anxiety disorder 04/27/2011  . HYPERTENSION, BENIGN ESSENTIAL 02/17/2010  . History of DYSPLASIA, CERVIX, MILD 05/15/2008  . GASTROESOPHAGEAL REFLUX DISEASE, CHRONIC 11/02/2007  . Mixed bipolar I disorder (HCC) 08/21/2007  . TOBACCO DEPENDENCE 05/12/2006  . Asthma, intermittent 05/12/2006  . Osteoarthritis of spine 05/12/2006    Medications- reviewed and updated Current Outpatient Prescriptions  Medication Sig Dispense Refill  . albuterol (PROVENTIL HFA;VENTOLIN HFA) 108 (90 BASE) MCG/ACT inhaler Inhale 2 puffs into the lungs every 6 (six) hours as needed. Takes for shortness of breath 18 g 5  . amLODipine (NORVASC) 5 MG tablet TAKE 1 TABLET BY MOUTH EVERY DAY 90 tablet 3  . cetirizine (ZYRTEC) 10 MG tablet TAKE 1 TABLET BY MOUTH DAILY 30 tablet PRN  . clindamycin-benzoyl peroxide (BENZACLIN) gel Apply topically 2 (two) times daily. 35 g prn  . diclofenac sodium (VOLTAREN) 1 % GEL Apply 4 g topically 4 (four) times daily as needed (for shoulder & knee pain). 100 g 3  . gabapentin (NEURONTIN) 300 MG capsule TAKE 1 CAPSULE(300 MG) BY MOUTH THREE TIMES DAILY 270 capsule 0  . HYDROcodone-acetaminophen (NORCO/VICODIN) 5-325 MG tablet Take 1 tablet by mouth every 6 (six) hours as needed for moderate pain.    . hydrOXYzine (VISTARIL) 100 MG capsule Take 100 mg by mouth 2 (two) times daily as needed for itching.    . meloxicam (MOBIC) 15 MG tablet Take 15 mg by mouth daily.    .Marland Kitchen  meloxicam (MOBIC) 15 MG tablet TAKE 1 TABLET BY MOUTH DAILY 90 tablet 0  . montelukast (SINGULAIR) 10 MG tablet Take 1 tablet (10 mg total) by mouth at bedtime. 30 tablet PRN  . omeprazole (PRILOSEC) 40 MG capsule Take 1 capsule (40 mg total) by mouth daily. 90 capsule 3  . PRAZOSIN HCL PO Take by mouth.    . triamcinolone cream (KENALOG) 0.1 % Apply topically daily. 45 g 3  . Venlafaxine HCl 37.5  MG TB24 Take by mouth. Two tablets in morning and Two tablets in afternoon    . ziprasidone (GEODON) 80 MG capsule Take 80 mg by mouth 2 (two) times daily with a meal.     No current facility-administered medications for this visit.    Objective: Office vital signs reviewed. BP 129/91 mmHg  Pulse 78  Temp(Src) 98.3 F (36.8 C) (Oral)  Wt 168 lb (76.204 kg)  SpO2 100%  LMP 09/01/2015   Physical Examination:  General: Awake, alert, well- nourished, NAD. Pleasant  Breast: Normal to inspection without any skin change. No nipple discharge. Significant tenderness diffusely. Some mild fibrous nodularity noted in the breast tissue bilaterally. No axillary nodes noted.  Assessment/Plan: Breast tenderness The patient is presenting with progressively worsening breast tenderness and reports of nodules over the last 2 months. Her symptoms sound suggestive of fibrocystic breast disease, however she denies a cyclic pattern to her pain.  She does not completely fit the history of fibrocystic breast disease, I would feel more comfortable obtaining a diagnostic mammogram and ultrasound. The patient is agreement.     Orders Placed This Encounter  Procedures  . MM DIAG BREAST TOMO BILATERAL    Standing Status: Future     Number of Occurrences:      Standing Expiration Date: 11/19/2016    Order Specific Question:  Reason for Exam (SYMPTOM  OR DIAGNOSIS REQUIRED)    Answer:  non-cyclic breast tenderness x 2 months, small nodules on exam, aunt with a h/o breast CA in late 6930s    Order Specific Question:  Is the patient pregnant?    Answer:  No    Order Specific Question:  Preferred imaging location?    [facility]  . US BREAST LTD UNI LEFT INC AXILLA    Standing Status: Future     Number of Occurrences:      Standing Expiration Date: 11/19/2016    Order Specific Question:  Reason for Exam (SYMPTOM  OR DIAGNOSIS REQUIRED)    Answer:  non-cyclic breast tenderness x 2 months, small nodules  on exam, aunt with a h/o breast CA in late 3130s    Order Specific Question:  Preferred imaging location?    [facility]  . US BREAST LTD UNI RIGHT INC AXILLA    Standing Status: Future     Number of Occurrences:      Standing Expiration Date: 11/19/2016    Order Specific Question:  Reason for Exam (SYMPTOM  OR DIAGNOSIS REQUIRED)    Answer:  non-cyclic breast tenderness x 2 months, small nodules on exam, aunt with a h/o breast CA in late 4230s    Order Specific Question:  [facility] imaging [facility]?    [facility]    No orders of the defined types were placed in this encounter.    Joanna Puffrystal S. Hurman Ketelsen PGY-2, [facility]

## 2015-09-24 ENCOUNTER — Ambulatory Visit
Admission: RE | Admit: 2015-09-24 | Discharge: 2015-09-24 | Disposition: A | Discharge status: Home / Self Care | Payer: Medicaid Other | Source: Ambulatory Visit | Attending: Family Medicine | Admitting: Family Medicine

## 2015-09-24 ENCOUNTER — Other Ambulatory Visit: Payer: Self-pay | Admitting: Family Medicine

## 2015-09-24 ENCOUNTER — Ambulatory Visit: Payer: Medicaid Other

## 2015-09-24 DIAGNOSIS — N6320 Unspecified lump in the left breast, unspecified quadrant: Secondary | ICD-10-CM

## 2015-09-24 DIAGNOSIS — N644 Mastodynia: Secondary | ICD-10-CM

## 2015-09-26 ENCOUNTER — Encounter: Payer: Self-pay | Admitting: Family Medicine

## 2015-09-26 DIAGNOSIS — L209 Atopic dermatitis, unspecified: Secondary | ICD-10-CM

## 2015-09-26 DIAGNOSIS — L700 Acne vulgaris: Secondary | ICD-10-CM

## 2015-09-26 DIAGNOSIS — L659 Nonscarring hair loss, unspecified: Secondary | ICD-10-CM

## 2015-09-26 NOTE — Progress Notes (Signed)
Patient ID: Christene LyeShadonna P Torain, female   DOB: 1980-06-15, 35 y.o.   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   RTC 6 weeks

## 2015-09-28 ENCOUNTER — Encounter (HOSPITAL_COMMUNITY): Payer: Self-pay

## 2015-09-28 ENCOUNTER — Emergency Department (HOSPITAL_COMMUNITY)
Admission: EM | Admit: 2015-09-28 | Discharge: 2015-09-28 | Disposition: A | Discharge status: Home / Self Care | Payer: Medicaid Other | Attending: Emergency Medicine | Admitting: Emergency Medicine

## 2015-09-28 DIAGNOSIS — M5432 Sciatica, left side: Secondary | ICD-10-CM

## 2015-09-28 DIAGNOSIS — F17210 Nicotine dependence, cigarettes, uncomplicated: Secondary | ICD-10-CM | POA: Diagnosis not present

## 2015-09-28 DIAGNOSIS — M545 Low back pain: Secondary | ICD-10-CM | POA: Diagnosis present

## 2015-09-28 DIAGNOSIS — Z79899 Other long term (current) drug therapy: Secondary | ICD-10-CM | POA: Insufficient documentation

## 2015-09-28 DIAGNOSIS — J45909 Unspecified asthma, uncomplicated: Secondary | ICD-10-CM | POA: Diagnosis not present

## 2015-09-28 DIAGNOSIS — I10 Essential (primary) hypertension: Secondary | ICD-10-CM | POA: Diagnosis not present

## 2015-09-28 MED ORDER — DEXAMETHASONE SODIUM PHOSPHATE 10 MG/ML IJ SOLN
10.0000 mg | Freq: Once | INTRAMUSCULAR | Status: AC
  Administered 2015-09-28: 10 mg via INTRAMUSCULAR
  Filled 2015-09-28: qty 1

## 2015-09-28 MED ORDER — HYDROMORPHONE HCL 1 MG/ML IJ SOLN
2.0000 mg | Freq: Once | INTRAMUSCULAR | Status: AC
  Administered 2015-09-28: 2 mg via INTRAMUSCULAR
  Filled 2015-09-28: qty 2

## 2015-09-28 MED ORDER — NAPROXEN 500 MG PO TABS: 500.0000 mg | ORAL_TABLET | Freq: Two times a day (BID) | ORAL | Status: DC

## 2015-09-28 MED ORDER — KETOROLAC TROMETHAMINE 60 MG/2ML IM SOLN
60.0000 mg | Freq: Once | INTRAMUSCULAR | Status: AC
  Administered 2015-09-28: 60 mg via INTRAMUSCULAR
  Filled 2015-09-28: qty 2

## 2015-09-28 MED ORDER — PREDNISONE 10 MG (21) PO TBPK
10.0000 mg | ORAL_TABLET | Freq: Every day | ORAL | Status: DC
Start: 2015-09-28 — End: 2016-01-15

## 2015-09-28 NOTE — ED Notes (Signed)
Patient here with right lower back pain with radiation down buttock and leg since Wednesday, has had ongoing back problems. Pain with any change in position
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
## 2015-09-28 NOTE — Discharge Instructions (Signed)
SEEK IMMEDIATE MEDICAL ATTENTION IF: °New numbness, tingling, weakness, or problem with the use of your arms or legs.  °Severe back pain not relieved with medications.  °Change in bowel or bladder control.  °Increasing pain in any areas of the body (such as chest or abdominal pain).  °Shortness of breath, dizziness or fainting.  °Nausea (feeling sick to your stomach), vomiting, fever, or sweats. ° ° °Sciatica °Sciatica is pain, weakness, numbness, or tingling along the path of the sciatic nerve. The nerve starts in the lower back and runs down the back of each leg. The nerve controls the muscles in the lower leg and in the back of the knee, while also providing sensation to the back of the thigh, lower leg, and the sole of your foot. Sciatica is a symptom of another medical condition. For instance, nerve damage or certain conditions, such as a herniated disk or bone spur on the spine, pinch or put pressure on the sciatic nerve. This causes the pain, weakness, or other sensations normally associated with sciatica. Generally, sciatica only affects one side of the body. °CAUSES  °· Herniated or slipped disc. °· Degenerative disk disease. °· A pain disorder involving the narrow muscle in the buttocks (piriformis syndrome). °· Pelvic injury or fracture. °· Pregnancy. °· Tumor (rare). °SYMPTOMS  °Symptoms can vary from mild to very severe. The symptoms usually travel from the low back to the buttocks and down the back of the leg. Symptoms can include: °· Mild tingling or dull aches in the lower back, leg, or hip. °· Numbness in the back of the calf or sole of the foot. °· Burning sensations in the lower back, leg, or hip. °· Sharp pains in the lower back, leg, or hip. °· Leg weakness. °· Severe back pain inhibiting movement. °These symptoms may get worse with coughing, sneezing, laughing, or prolonged sitting or standing. Also, being overweight may worsen symptoms. °DIAGNOSIS  °Your caregiver will perform a physical exam  to look for common symptoms of sciatica. He or she may ask you to do certain movements or activities that would trigger sciatic nerve pain. Other tests may be performed to find the cause of the sciatica. These may include: °· Blood tests. °· X-rays. °· Imaging tests, such as an MRI or CT scan. °TREATMENT  °Treatment is directed at the cause of the sciatic pain. Sometimes, treatment is not necessary and the pain and discomfort goes away on its own. If treatment is needed, your caregiver may suggest: °· Over-the-counter medicines to relieve pain. °· Prescription medicines, such as anti-inflammatory medicine, muscle relaxants, or narcotics. °· Applying heat or ice to the painful area. °· Steroid injections to lessen pain, irritation, and inflammation around the nerve. °· Reducing activity during periods of pain. °· Exercising and stretching to strengthen your abdomen and improve flexibility of your spine. Your caregiver may suggest losing weight if the extra weight makes the back pain worse. °· Physical therapy. °· Surgery to eliminate what is pressing or pinching the nerve, such as a bone spur or part of a herniated disk. °HOME CARE INSTRUCTIONS  °· Only take over-the-counter or prescription medicines for pain or discomfort as directed by your caregiver. °· Apply ice to the affected area for 20 minutes, 3-4 times a day for the first 48-72 hours. Then try heat in the same way. °· Exercise, stretch, or perform your usual activities if these do not aggravate your pain. °· Attend physical therapy sessions as directed by your caregiver. °· Keep   all follow-up appointments as directed by your caregiver. °· Do not wear high heels or shoes that do not provide proper support. °· Check your mattress to see if it is too soft. A firm mattress may lessen your pain and discomfort. °SEEK IMMEDIATE MEDICAL CARE IF:  °· You lose control of your bowel or bladder (incontinence). °· You have increasing weakness in the lower back, pelvis,  buttocks, or legs. °· You have redness or swelling of your back. °· You have a burning sensation when you urinate. °· You have pain that gets worse when you lie down or awakens you at night. °· Your pain is worse than you have experienced in the past. °· Your pain is lasting longer than 4 weeks. °· You are suddenly losing weight without reason. °MAKE SURE YOU: °· Understand these instructions. °· Will watch your condition. °· Will get help right away if you are not doing well or get worse. °  °This information is not intended to replace advice given to you by your health care provider. Make sure you discuss any questions you have with your health care provider. °  °Document Released: 02/23/2001 Document Revised: 11/20/2014 Document Reviewed: 07/11/2011 °Elsevier Interactive Patient Education ©2016 [employer] ° °

## 2015-09-28 NOTE — ED Notes (Signed)
A Harris, PA, in w/pt. 

## 2015-09-29 ENCOUNTER — Telehealth: Payer: Self-pay | Admitting: *Deleted

## 2015-09-29 NOTE — Telephone Encounter (Signed)
Pt went to ED last night, she has 2 questions for Dr. McDiarmid:  1.  She wants to know if there is another back doctor he can refer her to.  She does not really care for Dr. Ethelene Halamos, she wants to know if he can suggest someone else or [facility].  2.  ED advised her to be seen by PCP this week, wants to know if Dr. McDiarmid thinks she needs to be seen since it was for her back. Fleeger, Maryjo RochesterJessica Dawn, CMA

## 2015-09-29 NOTE — Telephone Encounter (Signed)
Will forward to MD to advise. Joffrey Kerce,CMA  

## 2015-10-01 ENCOUNTER — Ambulatory Visit: Payer: Medicaid Other

## 2015-10-21 ENCOUNTER — Other Ambulatory Visit: Payer: Self-pay | Admitting: *Deleted

## 2015-10-21 NOTE — Telephone Encounter (Signed)
Patient states that the directions on her omeprazole have her taking it once a day and she states she takes this medication twice a day. Patient requesting refill with twice daily instructions be sent to her pharmacy.

## 2015-10-22 ENCOUNTER — Telehealth: Payer: Self-pay | Admitting: Family Medicine

## 2015-10-22 MED ORDER — OMEPRAZOLE 40 MG PO CPDR: 40.0000 mg | DELAYED_RELEASE_CAPSULE | Freq: Two times a day (BID) | ORAL | 3 refills | Status: DC

## 2015-10-22 NOTE — Telephone Encounter (Signed)
Patient asks PCP for a referral with [name] because she doesn't feel [facility] (Dr. Sheran Luzichard Ramos) are doing the best for her. Please, follow up asap.

## 2015-10-22 NOTE — Telephone Encounter (Signed)
Patient was requesting a Toradol injection.  Reviewing medication list, patient does not have a current order.  Will forward to PCP, please advise.  Clovis PuMartin, Natasia Sanko L, RN

## 2015-10-23 ENCOUNTER — Other Ambulatory Visit: Payer: Self-pay | Admitting: Family Medicine

## 2015-10-23 ENCOUNTER — Ambulatory Visit: Payer: Medicaid Other

## 2015-10-23 DIAGNOSIS — M4716 Other spondylosis with myelopathy, lumbar region: Secondary | ICD-10-CM

## 2015-10-23 MED ORDER — KETOROLAC TROMETHAMINE 30 MG/ML IJ SOLN: 30.0000 mg | Freq: Once | INTRAMUSCULAR | Status: AC

## 2015-10-23 NOTE — Telephone Encounter (Signed)
Spoke with patient regarding Toradol injections.  Per Dr. McDiarmid; patient can restart Toradol 30 mg IM once a week from nursing staff.  He will work on putting in standing order. Today Toradol 30 mg IM x 1.  Clovis PuMartin, Tamika L, RN

## 2015-10-30 ENCOUNTER — Ambulatory Visit (INDEPENDENT_AMBULATORY_CARE_PROVIDER_SITE_OTHER): Payer: Medicaid Other | Admitting: *Deleted

## 2015-10-30 DIAGNOSIS — M25511 Pain in right shoulder: Secondary | ICD-10-CM

## 2015-10-30 DIAGNOSIS — G8929 Other chronic pain: Secondary | ICD-10-CM

## 2015-10-30 MED ORDER — KETOROLAC TROMETHAMINE 30 MG/ML IJ SOLN
30.0000 mg | Freq: Once | INTRAMUSCULAR | Status: AC
  Administered 2015-10-30: 30 mg via INTRAMUSCULAR

## 2015-10-30 NOTE — Progress Notes (Signed)
Patient in today for toradol shot. Injection given in left upper outer quadrant, patient without complains, site unremarkable.

## 2015-11-13 ENCOUNTER — Ambulatory Visit (INDEPENDENT_AMBULATORY_CARE_PROVIDER_SITE_OTHER): Payer: Medicaid Other | Admitting: *Deleted

## 2015-11-13 DIAGNOSIS — M5136 Other intervertebral disc degeneration, lumbar region: Secondary | ICD-10-CM

## 2015-11-13 DIAGNOSIS — M47817 Spondylosis without myelopathy or radiculopathy, lumbosacral region: Secondary | ICD-10-CM

## 2015-11-13 DIAGNOSIS — M545 Low back pain: Secondary | ICD-10-CM

## 2015-11-13 DIAGNOSIS — M5442 Lumbago with sciatica, left side: Secondary | ICD-10-CM | POA: Diagnosis not present

## 2015-11-13 DIAGNOSIS — M25511 Pain in right shoulder: Secondary | ICD-10-CM | POA: Diagnosis not present

## 2015-11-13 DIAGNOSIS — G8929 Other chronic pain: Secondary | ICD-10-CM | POA: Diagnosis not present

## 2015-11-13 DIAGNOSIS — G894 Chronic pain syndrome: Secondary | ICD-10-CM

## 2015-11-13 DIAGNOSIS — M791 Myalgia: Secondary | ICD-10-CM | POA: Diagnosis not present

## 2015-11-13 DIAGNOSIS — G549 Nerve root and plexus disorder, unspecified: Secondary | ICD-10-CM

## 2015-11-13 DIAGNOSIS — M7918 Myalgia, other site: Secondary | ICD-10-CM

## 2015-11-13 MED ORDER — KETOROLAC TROMETHAMINE 30 MG/ML IJ SOLN
30.0000 mg | Freq: Once | INTRAMUSCULAR | Status: AC
  Administered 2015-11-13: 30 mg via INTRAMUSCULAR

## 2015-11-13 NOTE — Addendum Note (Signed)
Addended byPerley Jain: Aneita Kiger D on: 11/13/2015 01:51 PM   Modules accepted: Orders

## 2015-11-13 NOTE — Progress Notes (Addendum)
   Patient walked in for daughter's appt and requested toradaol injection for low back pain; rates 8-9/10 at present. Standing order for toradol 30 mg IM weekly from PCP given on 10/22/2015.  Toradol 30 mg given IM RUOQ. Patient tolerated well.  Patient requesting next dose be increased to 60 mg as pain returns within 24 hours after receiving 30 mg.  Also asking status of referral [facility] as she is dissatisfied with current Orthopaedic doctor.  Fredderick SeveranceUCATTE, LAURENZE L, RN  ------------------------------------------------------------------------------------------------------------------------------------------------------------------- I spoke with Ms Gertie ExonShadonna Ostermiller during [facility]/13/[facility]. I changed the Toradol order such that she may receive 60 mg Toradol IM every week prn moderate to severe back or shoulder pain.   I spoke with her that Dr Ethelene Halamos is not an orthopedist, rather he is a PM&R physician who specializes in chronic pain management. Referral to an orthopedist would not replace an pain management specialist in her case. I informed Ms Marykay LexBoler that changing between pain clinics is difficult and can lead to a patient leaving a pain clinic practice and with no pain clinics willing to accept her because of the behavior of leaving a pain clinic.  She stated she understood.  She plans to discuss her options for pain management with her neurosurgeon, Dr Venetia MaxonStern.  TODD MCDIARMID, MD

## 2015-11-28 LAB — GLUCOSE, POCT (MANUAL RESULT ENTRY): POC Glucose: 95 mg/dl (ref 70–99)

## 2015-12-01 ENCOUNTER — Encounter: Payer: Self-pay | Admitting: Internal Medicine

## 2015-12-01 ENCOUNTER — Ambulatory Visit: Payer: Medicaid Other | Admitting: Internal Medicine

## 2015-12-01 ENCOUNTER — Ambulatory Visit (INDEPENDENT_AMBULATORY_CARE_PROVIDER_SITE_OTHER): Payer: Medicaid Other | Admitting: Internal Medicine

## 2015-12-01 ENCOUNTER — Other Ambulatory Visit: Payer: Self-pay | Admitting: Family Medicine

## 2015-12-01 VITALS — BP 120/78 | HR 80 | Temp 98.6°F | Ht 66.0 in | Wt 168.0 lb

## 2015-12-01 DIAGNOSIS — Z23 Encounter for immunization: Secondary | ICD-10-CM

## 2015-12-01 DIAGNOSIS — M5136 Other intervertebral disc degeneration, lumbar region: Secondary | ICD-10-CM | POA: Diagnosis not present

## 2015-12-01 DIAGNOSIS — L600 Ingrowing nail: Secondary | ICD-10-CM

## 2015-12-01 DIAGNOSIS — M79675 Pain in left toe(s): Secondary | ICD-10-CM | POA: Diagnosis not present

## 2015-12-01 MED ORDER — BETAMETHASONE VALERATE 0.1 % EX OINT: 1.0000 "application " | TOPICAL_OINTMENT | Freq: Two times a day (BID) | CUTANEOUS | 0 refills | Status: DC

## 2015-12-01 MED ORDER — KETOROLAC TROMETHAMINE 60 MG/2ML IM SOLN
60.0000 mg | Freq: Once | INTRAMUSCULAR | Status: AC
  Administered 2015-12-01: 60 mg via INTRAMUSCULAR

## 2015-12-01 NOTE — Patient Instructions (Addendum)
[name],  I recommend soaking your foot in either warm, soapy water or water mixed for 1-2 teaspoons of epsom salts for 10-20 minutes 3 times a day for 2 weeks. Apply steroid ointment twice a day if you see redness or swelling.  You should get a call about scheduling a podiatry appointment within the next week. If you do not hear from anyone, please call clinic to follow-up.  If pain gets worse or redness and swelling develop, please call for appointment for nail removal.  Best, Dr. Thurmond ButtsFitzgerald  Ingrown Toenail An ingrown toenail occurs when the corner or sides of your toenail grow into the surrounding skin. The big toe is most commonly affected, but it can happen to any of your toes. If your ingrown toenail is not treated, you will be at risk for infection. CAUSES This condition may be caused by:  Wearing shoes that are too small or tight.  Injury or trauma, such as stubbing your toe or having your toe stepped on.  Improper cutting or care of your toenails.  Being born with (congenital) nail or foot abnormalities, such as having a nail that is too big for your toe. RISK FACTORS Risk factors for an ingrown toenail include:  Age. Your nails tend to thicken as you get older, so ingrown nails are more common in older people.  Diabetes.  Cutting your toenails incorrectly.  Blood circulation problems. SYMPTOMS Symptoms may include:  Pain, soreness, or tenderness.  Redness.  Swelling.  Hardening of the skin surrounding the toe. Your ingrown toenail may be infected if there is fluid, pus, or drainage. DIAGNOSIS  An ingrown toenail may be diagnosed by medical history and physical exam. If your toenail is infected, your health care provider may test a sample of the drainage. TREATMENT Treatment depends on the severity of your ingrown toenail. Some ingrown toenails may be treated at home. More severe or infected ingrown toenails may require surgery to remove all or part of the  nail. Infected ingrown toenails may also be treated with antibiotic medicines. HOME CARE INSTRUCTIONS  If you were prescribed an antibiotic medicine, finish all of it even if you start to feel better.  Soak your foot in warm soapy water for 20 minutes, 3 times per day or as directed by your health care provider.  Carefully lift the edge of the nail away from the sore skin by wedging a small piece of cotton under the corner of the nail. This may help with the pain. Be careful not to cause more injury to the area.  Wear shoes that fit well. If your ingrown toenail is causing you pain, try wearing sandals, if possible.  Trim your toenails regularly and carefully. Do not cut them in a curved shape. Cut your toenails straight across. This prevents injury to the skin at the corners of the toenail.  Keep your feet clean and dry.  If you are having trouble walking and are given crutches by your health care provider, use them as directed.  Do not pick at your toenail or try to remove it yourself.  Take medicines only as directed by your health care provider.  Keep all follow-up visits as directed by your health care provider. This is important. SEEK MEDICAL CARE IF:  Your symptoms do not improve with treatment. SEEK IMMEDIATE MEDICAL CARE IF:  You have red streaks that start at your foot and go up your leg.  You have a fever.  You have increased redness, swelling, or  pain.  You have fluid, blood, or pus coming from your toenail.   This information is not intended to replace advice given to you by your health care provider. Make sure you discuss any questions you have with your health care provider.   Document Released: 02/27/2000 Document Revised: 07/16/2014 Document Reviewed: 01/23/2014 Elsevier Interactive Patient Education [employer]

## 2015-12-01 NOTE — Progress Notes (Signed)
Pt requested a toradol shot today as I was working her up.  Reviewed chart, per Dr. McDiarmid she has a standing order for $RemoveBeforeDEI _[id number]$60mg was 11/13/15, injection given today with no issues, pt tolerated well. Fleeger, Maryjo RochesterJessica Dawn, CMA

## 2015-12-01 NOTE — Addendum Note (Signed)
Addended by: Jamelle HaringFITZGERALD, Kally Cadden M on: 12/01/2015 12:07 PM   Modules accepted: Orders

## 2015-12-01 NOTE — Assessment & Plan Note (Signed)
-  No signs of paronychia or obvious infection but significant pain limiting use of normal footwear. Discussed conservative treatment versus toenail removal today -Recommended regular soaks of left foot in warm soapy water or Epsom salts for 10-20 minutes at a time 3 times a day for the next 2 weeks. Also prescribed topical steroid ointment to apply to medial edge of left hallux if swelling recurs. -Placed referral to podiatry for trimming given significant pain. Advised patient to avoid trimming edge of nails until seen by specialist.  -Also recommended trying topical antifungals for possible contribution of toenail fungus but none covered by Medicaid. -Instructed patient to call clinic and schedule toenail removal if pain worsens or wait for podiatry appointment is too long

## 2015-12-01 NOTE — Progress Notes (Signed)
[facility] Progress [facility]  Subjective:  Shirley ExonShadonna [name] is a 35 year old female who presents for concern of ingrown toenail. Nail of left great toe has been bothering her for about a year. Denies inciting injury. This past week nail has been so painful she could not wear a closed-toe shoe. She has been trimming the edge of her nail herself, which recently resulted in some bleeding. Has also noted redness and swelling last week, which has since improved. Has tried epsom salt soaks but not consistently. Has been applying alcohol and hydrogen peroxide without improvement. Wearing heels or formal shoes makes pain worse. Receives weekly Toradol shots and takes Vicodin for degenerative disc disease, and toe pain still present. Has not seen a podiatrist.   ROS: No fevers, no numbness   Social: Current smoker.  Objective: Blood pressure 120/78, pulse 80, temperature 98.6 F (37 C), temperature source Oral, height 5\' 6"  (1.676 m), weight 168 lb (76.2 kg), SpO2 98 %. Constitutional: Well-appearing female in no apparent distress  Cardiovascular: RRR, S1, S2, no m/r/g.  Musculoskeletal: No lower extremity edema. Gait not impaired.  Neurological: Peripheral sensation intact  Skin: Medial aspect of left great toenail jagged but not grossly in-turned or inflamed. Pain with pulling skin away from medial edge of toenail. No pain with palpation of surrounding skin. Both big toenails thickened compared to other nails but no discoloration. Psychiatric: Normal mood and affect.  Vitals reviewed  Assessment/Plan: Ingrown left big toenail -No signs of paronychia or obvious infection but significant pain limiting use of normal footwear. Discussed conservative treatment versus toenail removal today -Recommended regular soaks of left foot in warm soapy water or Epsom salts for 10-20 minutes at a time 3 times a day for the next 2 weeks. Also prescribed topical steroid ointment to apply to medial edge of  left hallux if swelling recurs. -Placed referral to podiatry for trimming given significant pain. Advised patient to avoid trimming edge of nails until seen by specialist.  -Also recommended trying topical antifungals for possible contribution of toenail fungus but none covered by Medicaid. -Instructed patient to call clinic and schedule toenail removal if pain worsens or wait for podiatry appointment is too long  Flu shot administered today.  Follow-up as needed or if redness and swelling developed to indicate toenail removal.  Dani GobbleHillary Ranier Coach, MD [facility], PGY-2

## 2015-12-15 ENCOUNTER — Ambulatory Visit: Payer: Medicaid Other | Admitting: Podiatry

## 2015-12-25 ENCOUNTER — Ambulatory Visit: Payer: Medicaid Other | Admitting: Podiatry

## 2015-12-30 ENCOUNTER — Other Ambulatory Visit: Payer: Self-pay | Admitting: Family Medicine

## 2015-12-30 DIAGNOSIS — M5442 Lumbago with sciatica, left side: Secondary | ICD-10-CM

## 2015-12-30 DIAGNOSIS — G8929 Other chronic pain: Secondary | ICD-10-CM

## 2015-12-30 DIAGNOSIS — M5136 Other intervertebral disc degeneration, lumbar region: Secondary | ICD-10-CM

## 2015-12-30 NOTE — Assessment & Plan Note (Signed)
Dr Satira SarkSt

## 2016-01-01 ENCOUNTER — Ambulatory Visit (INDEPENDENT_AMBULATORY_CARE_PROVIDER_SITE_OTHER): Payer: Medicaid Other | Admitting: *Deleted

## 2016-01-01 DIAGNOSIS — M5137 Other intervertebral disc degeneration, lumbosacral region: Secondary | ICD-10-CM | POA: Diagnosis not present

## 2016-01-01 MED ORDER — KETOROLAC TROMETHAMINE 30 MG/ML IJ SOLN
30.0000 mg | Freq: Once | INTRAMUSCULAR | Status: AC
  Administered 2016-01-01: 30 mg via INTRAMUSCULAR

## 2016-01-01 NOTE — Progress Notes (Signed)
   Patient in clinic with her daughter this AM.  Patient requested Toradol injection for lower back pain.  Patient has standing order for 30 mg/ml written by Dr. Perley Jain.  Toradol 30 mg/ml x 1 IM given LUOQ.  Patient advised to follow up with PCP.  Clovis Pu, RN

## 2016-01-02 ENCOUNTER — Ambulatory Visit (INDEPENDENT_AMBULATORY_CARE_PROVIDER_SITE_OTHER): Payer: Medicaid Other | Admitting: Student

## 2016-01-02 ENCOUNTER — Other Ambulatory Visit: Payer: Self-pay | Admitting: Student

## 2016-01-02 ENCOUNTER — Encounter: Payer: Self-pay | Admitting: Student

## 2016-01-02 VITALS — BP 108/83 | Temp 98.4°F | Wt 160.0 lb

## 2016-01-02 DIAGNOSIS — R232 Flushing: Secondary | ICD-10-CM

## 2016-01-02 DIAGNOSIS — R1013 Epigastric pain: Secondary | ICD-10-CM | POA: Diagnosis not present

## 2016-01-02 DIAGNOSIS — E2839 Other primary ovarian failure: Secondary | ICD-10-CM | POA: Insufficient documentation

## 2016-01-02 DIAGNOSIS — R109 Unspecified abdominal pain: Secondary | ICD-10-CM | POA: Insufficient documentation

## 2016-01-02 DIAGNOSIS — E288 Other ovarian dysfunction: Secondary | ICD-10-CM

## 2016-01-02 LAB — CBC WITH DIFFERENTIAL/PLATELET
Basophils Absolute: 0 cells/uL (ref 0–200)
Basophils Relative: 0 %
EOS PCT: 1 %
Eosinophils Absolute: 69 cells/uL (ref 15–500)
HCT: 43.9 % (ref 35.0–45.0)
HEMOGLOBIN: 15.2 g/dL (ref 11.7–15.5)
LYMPHS ABS: 3864 {cells}/uL (ref 850–3900)
LYMPHS PCT: 56 %
MCH: 30.7 pg (ref 27.0–33.0)
MCHC: 34.6 g/dL (ref 32.0–36.0)
MCV: 88.7 fL (ref 80.0–100.0)
MPV: 9.6 fL (ref 7.5–12.5)
Monocytes Absolute: 207 cells/uL (ref 200–950)
Monocytes Relative: 3 %
NEUTROS PCT: 40 %
Neutro Abs: 2760 cells/uL (ref 1500–7800)
PLATELETS: 251 10*3/uL (ref 140–400)
RBC: 4.95 MIL/uL (ref 3.80–5.10)
RDW: 13.1 % (ref 11.0–15.0)
WBC: 6.9 10*3/uL (ref 3.8–10.8)

## 2016-01-02 LAB — HEMOCCULT GUIAC POC 1CARD (OFFICE): FECAL OCCULT BLD: NEGATIVE

## 2016-01-02 LAB — COMPLETE METABOLIC PANEL WITH GFR
ALBUMIN: 4.8 g/dL (ref 3.6–5.1)
ALK PHOS: 60 U/L (ref 33–115)
ALT: 9 U/L (ref 6–29)
AST: 15 U/L (ref 10–30)
BUN: 11 mg/dL (ref 7–25)
CALCIUM: 9.9 mg/dL (ref 8.6–10.2)
CO2: 26 mmol/L (ref 20–31)
Chloride: 103 mmol/L (ref 98–110)
Creat: 0.91 mg/dL (ref 0.50–1.10)
GFR, EST NON AFRICAN AMERICAN: 81 mL/min (ref >=60)
GFR, Est African American: >89 mL/min (ref >=60)
Glucose, Bld: 75 mg/dL (ref 65–99)
POTASSIUM: 3.4 mmol/L — AB (ref 3.5–5.3)
Sodium: 140 mmol/L (ref 135–146)
Total Bilirubin: 0.8 mg/dL (ref 0.2–1.2)
Total Protein: 7.2 g/dL (ref 6.1–8.1)

## 2016-01-02 MED ORDER — SUCRALFATE 1 G PO TABS: 1.0000 g | ORAL_TABLET | Freq: Two times a day (BID) | ORAL | 1 refills | Status: DC

## 2016-01-02 NOTE — Assessment & Plan Note (Addendum)
Abdominal pain differential included gastritis, pancreatitis, gasteroenteritis, Hemoccult negative. Differential also includes gall stone, biliary stones. Non acute abdomen on exam, negative murphy's makes gall bladder unlikely or other critical abdominal process less likely. Given history of gastritis and current pain similar to what she has had in past with gastritis, this is most likely what she is experiencing. - Lipase, CMP, CBC collected, will follow - still start carafate in addition to omeprazole - return precautions discussed

## 2016-01-02 NOTE — Patient Instructions (Addendum)
Follow-up PCP as needed for abdominal pain, and other concerns You may take Carafate and omeprazole for abdominal pain You did not have a GI bleed You will have labs today, and will be called to your abnormal If you have questions or concerns call the office at [phone]

## 2016-01-02 NOTE — Progress Notes (Addendum)
   Subjective:    Patient ID: [name], female    DOB: Aug 18, 1980, 35 y.o.   MRN: [id number]   CC:Abdominal pain  HPI: 35 year old female presenting for abdominal pain  Abdominal pain -started 3 days ago -she has also had loose stools associated that are darker than usual in nature -  pain is similar to her typical  gastritis - has taken her usual omeprazole no relief  -  reports nausea, no vomiting  - Denies fevers  - drinks 1-2 drinks of etoh socially   Additionally patient was concerned for hot flashes but her abdominal pain was most important, so evaluation for this was deferred  Smoking status reviewed  current smoker Review of Systems  Per HPI, else denies recent illness, fever, chest pain, shortness of breath,    Objective:  BP 108/83   Temp 98.4 F (36.9 C) (Oral)   Wt 160 lb (72.6 kg)   BMI 25.82 kg/m  Vitals and nursing note reviewed  General: NAD Cardiac: RRR, Respiratory: CTAB, normal effort Abdomen: tender To palpation diffusely most over epigastric area but distractible. Negative murphy's sign, no rebound, no guarding, nondistended, Bowel sounds present Extremities: no edema  Rectal: no lesions or hemorroids noted, no tenderness to rectal exam, no blood on glove  Hemoccult- negative   Assessment & Plan:    Abdominal pain Abdominal pain differential included gastritis, pancreatitis, gasteroenteritis, Hemoccult negative. Differential also includes gall stone, biliary stones. Non acute abdomen on exam, negative murphy's makes gall bladder unlikely or other critical abdominal process less likely. Given history of gastritis and current pain similar to what she has had in past with gastritis, this is most likely what she is experiencing. - Lipase, CMP, CBC collected, will follow - still start carafate in addition to omeprazole - return precautions discussed  Hot flashes Unclear etiology. Unable to obtain menstrual history as she has had a   Hysterectomy - will follow with PCP for evaluation - consider further evaluation if symptoms continue    Belynda Pagaduan A. Kennon RoundsHaney MD, MS Family Medicine Resident PGY-3 Pager [phone]

## 2016-01-02 NOTE — Assessment & Plan Note (Addendum)
Unclear etiology. Unable to obtain menstrual history as she has had a  Hysterectomy - will follow with PCP for evaluation - consider further evaluation if symptoms continue

## 2016-01-03 LAB — LIPASE: Lipase: 14 U/L (ref 7–60)

## 2016-01-11 ENCOUNTER — Emergency Department (HOSPITAL_COMMUNITY)
Admission: EM | Admit: 2016-01-11 | Discharge: 2016-01-11 | Disposition: A | Discharge status: Home / Self Care | Payer: Medicaid Other | Attending: Emergency Medicine | Admitting: Emergency Medicine

## 2016-01-11 ENCOUNTER — Encounter (HOSPITAL_COMMUNITY): Payer: Self-pay

## 2016-01-11 DIAGNOSIS — J45909 Unspecified asthma, uncomplicated: Secondary | ICD-10-CM | POA: Insufficient documentation

## 2016-01-11 DIAGNOSIS — Z79899 Other long term (current) drug therapy: Secondary | ICD-10-CM | POA: Diagnosis not present

## 2016-01-11 DIAGNOSIS — F17210 Nicotine dependence, cigarettes, uncomplicated: Secondary | ICD-10-CM | POA: Insufficient documentation

## 2016-01-11 DIAGNOSIS — R51 Headache: Secondary | ICD-10-CM | POA: Diagnosis present

## 2016-01-11 DIAGNOSIS — I10 Essential (primary) hypertension: Secondary | ICD-10-CM | POA: Insufficient documentation

## 2016-01-11 DIAGNOSIS — R519 Headache, unspecified: Secondary | ICD-10-CM

## 2016-01-11 MED ORDER — DIPHENHYDRAMINE HCL 50 MG/ML IJ SOLN
25.0000 mg | Freq: Once | INTRAMUSCULAR | Status: AC
  Administered 2016-01-11: 25 mg via INTRAVENOUS
  Filled 2016-01-11: qty 1

## 2016-01-11 MED ORDER — SODIUM CHLORIDE 0.9 % IV BOLUS (SEPSIS)
1000.0000 mL | Freq: Once | INTRAVENOUS | Status: AC
  Administered 2016-01-11: 1000 mL via INTRAVENOUS

## 2016-01-11 MED ORDER — KETOROLAC TROMETHAMINE 30 MG/ML IJ SOLN
30.0000 mg | Freq: Once | INTRAMUSCULAR | Status: AC
  Administered 2016-01-11: 30 mg via INTRAVENOUS
  Filled 2016-01-11: qty 1

## 2016-01-11 MED ORDER — METOCLOPRAMIDE HCL 5 MG/ML IJ SOLN
10.0000 mg | Freq: Once | INTRAMUSCULAR | Status: AC
  Administered 2016-01-11: 10 mg via INTRAVENOUS
  Filled 2016-01-11: qty 2

## 2016-01-11 MED ORDER — DEXAMETHASONE SODIUM PHOSPHATE 10 MG/ML IJ SOLN
10.0000 mg | Freq: Once | INTRAMUSCULAR | Status: AC
  Administered 2016-01-11: 10 mg via INTRAVENOUS
  Filled 2016-01-11: qty 1

## 2016-01-11 NOTE — ED Provider Notes (Signed)
Patient care signed out at end of shift by Terance HartKelly Gekas, PA-C. Here with HA, and concern for HTN missed BP meds this morning Got hot, lightheaded, worse through the day Took her meds, HA persisted, presents for evaluation  Frontal, bilateral temporal Per chart, h/o same kind of headache  Exam nonfocal Giving headache cocktail, fluids "a little better"  Plan - recheck orthostatics Recheck pain Anticipate discharge home  Recheck: She is feeling better. Blood pressures have been normal. Headache pain trending down. Feel she is stable for discharge home and patient is comfortable with discharge. Encouraged close follow up with PCP.    Shirley AnisShari Cheron Coryell, PA-C 01/11/16 2151    Shirley Munchobert Lockwood, MD 01/12/16 [phone]
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
## 2016-01-11 NOTE — ED Triage Notes (Signed)
Patient here with headache that started while at church today after forgetting to take BP med this am. After getting home she took her medication but has ongoing headache and states her BP was much higher at home pta. Alert and oriented, no CP.

## 2016-01-12 ENCOUNTER — Emergency Department (HOSPITAL_COMMUNITY)
Admission: EM | Admit: 2016-01-12 | Discharge: 2016-01-12 | Disposition: A | Discharge status: Home / Self Care | Payer: Medicaid Other | Attending: Emergency Medicine | Admitting: Emergency Medicine

## 2016-01-12 ENCOUNTER — Emergency Department (HOSPITAL_COMMUNITY): Payer: Medicaid Other

## 2016-01-12 ENCOUNTER — Encounter (HOSPITAL_COMMUNITY): Payer: Self-pay | Admitting: *Deleted

## 2016-01-12 ENCOUNTER — Other Ambulatory Visit: Payer: Self-pay

## 2016-01-12 DIAGNOSIS — F17210 Nicotine dependence, cigarettes, uncomplicated: Secondary | ICD-10-CM | POA: Insufficient documentation

## 2016-01-12 DIAGNOSIS — R202 Paresthesia of skin: Secondary | ICD-10-CM | POA: Diagnosis not present

## 2016-01-12 DIAGNOSIS — J45909 Unspecified asthma, uncomplicated: Secondary | ICD-10-CM | POA: Diagnosis not present

## 2016-01-12 DIAGNOSIS — R51 Headache: Secondary | ICD-10-CM | POA: Diagnosis present

## 2016-01-12 DIAGNOSIS — I10 Essential (primary) hypertension: Secondary | ICD-10-CM | POA: Insufficient documentation

## 2016-01-12 DIAGNOSIS — R531 Weakness: Secondary | ICD-10-CM | POA: Insufficient documentation

## 2016-01-12 DIAGNOSIS — R519 Headache, unspecified: Secondary | ICD-10-CM

## 2016-01-12 LAB — CBC
HEMATOCRIT: 41.9 % (ref 36.0–46.0)
HEMOGLOBIN: 14.7 g/dL (ref 12.0–15.0)
MCH: 30.4 pg (ref 26.0–34.0)
MCHC: 35.1 g/dL (ref 30.0–36.0)
MCV: 86.6 fL (ref 78.0–100.0)
PLATELETS: 250 10*3/uL (ref 150–400)
RBC: 4.84 MIL/uL (ref 3.87–5.11)
RDW: 12.9 % (ref 11.5–15.5)
WBC: 8.9 10*3/uL (ref 4.0–10.5)

## 2016-01-12 LAB — BASIC METABOLIC PANEL
ANION GAP: 9 (ref 5–15)
BUN: 8 mg/dL (ref 6–20)
CHLORIDE: 109 mmol/L (ref 101–111)
CO2: 21 mmol/L — AB (ref 22–32)
Calcium: 9.8 mg/dL (ref 8.9–10.3)
Creatinine, Ser: 0.69 mg/dL (ref 0.44–1.00)
GFR calc non Af Amer: >60 mL/min (ref >60)
Glucose, Bld: 118 mg/dL — ABNORMAL HIGH (ref 65–99)
POTASSIUM: 3.6 mmol/L (ref 3.5–5.1)
SODIUM: 139 mmol/L (ref 135–145)

## 2016-01-12 LAB — URINALYSIS, ROUTINE W REFLEX MICROSCOPIC
Bilirubin Urine: NEGATIVE
GLUCOSE, UA: NEGATIVE mg/dL
Hgb urine dipstick: NEGATIVE
Ketones, ur: NEGATIVE mg/dL
LEUKOCYTES UA: NEGATIVE
Nitrite: NEGATIVE
PH: 6 (ref 5.0–8.0)
PROTEIN: NEGATIVE mg/dL
SPECIFIC GRAVITY, URINE: 1.019 (ref 1.005–1.030)

## 2016-01-12 LAB — CBG MONITORING, ED: Glucose-Capillary: 107 mg/dL — ABNORMAL HIGH (ref 65–99)

## 2016-01-12 MED ORDER — DIPHENHYDRAMINE HCL 50 MG/ML IJ SOLN
50.0000 mg | Freq: Once | INTRAMUSCULAR | Status: AC
Start: 2016-01-12 — End: 2016-01-12
  Administered 2016-01-12: 50 mg via INTRAVENOUS
  Filled 2016-01-12: qty 1

## 2016-01-12 MED ORDER — SODIUM CHLORIDE 0.9 % IV BOLUS (SEPSIS)
1000.0000 mL | Freq: Once | INTRAVENOUS | Status: AC
  Administered 2016-01-12: 1000 mL via INTRAVENOUS

## 2016-01-12 MED ORDER — AMLODIPINE BESYLATE 5 MG PO TABS
5.0000 mg | ORAL_TABLET | Freq: Once | ORAL | Status: AC
  Administered 2016-01-12: 5 mg via ORAL
  Filled 2016-01-12: qty 1

## 2016-01-12 MED ORDER — KETOROLAC TROMETHAMINE 30 MG/ML IJ SOLN
30.0000 mg | Freq: Once | INTRAMUSCULAR | Status: AC
  Administered 2016-01-12: 30 mg via INTRAVENOUS
  Filled 2016-01-12: qty 1

## 2016-01-12 MED ORDER — METOCLOPRAMIDE HCL 5 MG/ML IJ SOLN
10.0000 mg | Freq: Once | INTRAMUSCULAR | Status: AC
  Administered 2016-01-12: 10 mg via INTRAVENOUS
  Filled 2016-01-12: qty 2

## 2016-01-12 NOTE — ED Notes (Signed)
Patient transported to CT 

## 2016-01-12 NOTE — ED Notes (Signed)
Pt ambulated to bathroom for urine specimen with steady gait.

## 2016-01-12 NOTE — ED Notes (Signed)
Walked patient to the bathroom  
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
## 2016-01-12 NOTE — ED Notes (Signed)
MD at bedside. 

## 2016-01-12 NOTE — ED Triage Notes (Signed)
Pt states was tx here last night for migraine with "migraine cocktail".  Pt states she woke up this am with tingling/weakness to both legs and tingling to both hands.  Able to ambulate, but weak.

## 2016-01-12 NOTE — ED Notes (Signed)
Gave patient some coffee per Merry ProudBrandi RN

## 2016-01-12 NOTE — Discharge Instructions (Signed)

## 2016-01-12 NOTE — ED Notes (Signed)
Checked patient blood sugar it was 107 notified RN Of blood sugar

## 2016-01-12 NOTE — ED Notes (Signed)
plac ed patient into a gown and on the monitor waiting on provider

## 2016-01-13 ENCOUNTER — Ambulatory Visit (INDEPENDENT_AMBULATORY_CARE_PROVIDER_SITE_OTHER): Payer: Medicaid Other | Admitting: Student

## 2016-01-13 ENCOUNTER — Encounter: Payer: Self-pay | Admitting: Student

## 2016-01-13 DIAGNOSIS — I10 Essential (primary) hypertension: Secondary | ICD-10-CM | POA: Diagnosis present

## 2016-01-13 NOTE — Assessment & Plan Note (Signed)
Blood pressure well controlled, 129/81. No symptoms at this time. There is some concern for medication non compliance - patient counseled to continue norvasc as precribed - will not adjust med regimen at this time given normal blood pressure - will take blood pressure daily and record ina journal to bring to next PCP visit -

## 2016-01-13 NOTE — Progress Notes (Signed)
   Subjective:    Patient ID: [name], female    DOB: 05-08-1980, 35 y.o.   MRN: [id number]   CC: ER follow-up for headache  HPI: 35 year old female with stenting for ER follow-up for headache  Headache - Patient reports she had elevated blood pressures yesterday with a systolic to 208 - She had headache associated and numbness and tingling of her hands, these have resolved - In the ED showed negative CT head and her headache resolved - Her blood pressure time was 136/107 - She denies shortness of breath, chest pain at that time and currently - Denies headache at this time - Reports compliance with her Norvasc 5 mg usually but she did skip a dose on Sunday of this week    Smoking status reviewed Smokes 5 cigs per day Review of Systems  Per HPI, else denies recent illness, fever,  abdominal pain, N/V/D,   Objective:  BP 129/81   Pulse 89   Temp 98 F (36.7 C) (Oral)   Wt 163 lb (73.9 kg)   SpO2 100%   BMI 26.31 kg/m  Vitals and nursing note reviewed  General: NAD Cardiac: RRR Respiratory: CTAB, normal effort Extremities: no edema or cyanosis.  Skin: warm and dry, no rashes noted Neuro: alert and oriented   Assessment & Plan:    HYPERTENSION, BENIGN ESSENTIAL Blood pressure well controlled, 129/81. No symptoms at this time. There is some concern for medication non compliance - patient counseled to continue norvasc as precribed - will not adjust med regimen at this time given normal blood pressure - will take blood pressure daily and record ina journal to bring to next PCP visit -    Alyssa A. Kennon RoundsHaney MD, MS Family Medicine Resident PGY-3 Pager [phone]

## 2016-01-13 NOTE — Patient Instructions (Signed)
Follow-up as scheduled with your PCP Take her Norvasc daily as prescribed Check her blood pressure daily and record it and a log and bring to your PCP appointment If you develop chest pain, shortness of breath, severe headache which emergency department for evaluation If questions or concerns call the office at [phone]

## 2016-01-15 ENCOUNTER — Telehealth: Payer: Self-pay | Admitting: *Deleted

## 2016-01-15 ENCOUNTER — Ambulatory Visit (INDEPENDENT_AMBULATORY_CARE_PROVIDER_SITE_OTHER): Payer: Medicaid Other | Admitting: Student

## 2016-01-15 VITALS — BP 128/92 | HR 80 | Temp 98.4°F | Wt 165.8 lb

## 2016-01-15 DIAGNOSIS — R232 Flushing: Secondary | ICD-10-CM | POA: Diagnosis not present

## 2016-01-15 DIAGNOSIS — I10 Essential (primary) hypertension: Secondary | ICD-10-CM | POA: Diagnosis not present

## 2016-01-15 LAB — TSH: TSH: 0.64 mIU/L

## 2016-01-15 NOTE — Patient Instructions (Signed)
It was great seeing you today! We have addressed the following issues today  1. Blood pressure: your blood pressure is within normal limits here. I recommend you schedule an appointment for ambulatory blood pressure monitoring. Please let us know if you have worsening symptoms, chest pain, shortness of breath, fever or other symptoms concerning to you.   2.  Hot flash: I have ordered some blood tests today.     If we did any lab work today, and the results require attention, either me or my nurse will get in touch with you. If everything is normal, you will get a letter in mail. If you don't hear from us in two weeks, please give us a call. Otherwise, we look forward to seeing you again at your next visit. If you have any questions or concerns before then, please call the clinic at [phone].   Please bring all your medications to every doctors visit   Sign up for My Chart to have easy access to your labs results, and communication with your Primary care physician.     Please check-out at the front desk before leaving the clinic.    Take Care,

## 2016-01-15 NOTE — Assessment & Plan Note (Addendum)
Blood pressure at goal in the office. I suspect malfunction of her home blood pressure monitor versus masked hypertension.  She also endorses chest pain 2 days ago and this morning. However, chest pain is not exertional. She is also tender to palpation. I have low suspicion for ACS in this young female with no significant risk factor.  -Recommended follow-up with Dr. Raymondo BandKoval in a week or 2 for ambulatory blood pressure monitoring -Continue amlodipine

## 2016-01-15 NOTE — Telephone Encounter (Signed)
Patient called in stating that she feels "horrible", "woozy", like she's "having a panic attack" and like she's going to pass out. States BP this AM is 190/98 four hours after taking norvasc 5 mg. Yesterday BP was 168/94.States she's been seen in ED twice and our office once in last week for BP and headache. Denies headache at present. Spoke with Preceptor, Dr. Pollie MeyerMcIntyre, and patient given same day appt for 2 pm. Patient states mother will drive her to this appt.  Kinnie FeilL. Ducatte, RN, BSN

## 2016-01-15 NOTE — Progress Notes (Signed)
   Subjective:    Patient ID: [name] is a 35 y.o. old female. Patient presented to the clinic because of elevated blood pressure at home HPI #Hypertension: checked her blood pressure at home and it was 190/98. Repeat blood pressure was 183/108. Takes amlodipine 5 mg daily. She is also on potential antihypertensive medication such as Effexor and  Meloxicam.  She reports swimmy head, blurred vision and floaters intermittently for the last two days. Reports dizziness when she gets up from sitting. She describes the dizziness as feeling like drunk. Denies drinking alcohol recently. Felt chest pain 2 days ago. Felt heaviness on her chest this morning as well. Chest pain is not exertional. Denies pain in her arms or jaws. Denies swelling in her legs, dyspnea, orthopnea, cough, nausea or diaphoresis.  She denies fever but admits chills. She denies runny nose or sore throat.   #Hot flashes: She stated that she has had hot flashes for the last 2 weeks. However, hot flashes listed in the problem prior to that. She has a 2 menstrual cycles in the last 12 months. She had history of BTL after twin delivery.   PMH: positive for anxiety, schizoaffective disorder, mixed of bipolar disorder. She has no significant cardiac history except hypertension  SH: smokes 5-6 cigarettes a day. Drinks occasionally. Denies drug use.  Review of Systems Per HPI Objective:   Vitals:   01/15/16 1402  BP: (!) 128/92  Pulse: 80  Temp: 98.4 F (36.9 C)  TempSrc: Oral  Weight: 165 lb 12.8 oz (75.2 kg)    GEN: appears well, no apparent distress. Eyes: without conjunctival injection, sclera anicteric, negative for conjunctival palor Oropharynx: mmm without erythema or exudation HEM: negative for cervical lymphadenopathy CVS: RRR, normal s1 and s2, no murmurs, no edema RESP: no increased work of breathing, good air movement bilaterally, no crackles or wheeze GI: Bowel sounds present and normal, soft,  non-tender,non-distended ENDO: negative for thyromegaly MSK: tender to palpation over her sternum's diffusely NEURO: alert and oriented appropriately, motor 5 out of 5 in all muscle groups of upper and lower extremities, light sensation intact in all dermatomes,patellar and biceps reflexes 2+ bilaterally PSYCH: appropriate mood and affect     Assessment & Plan:  HYPERTENSION, BENIGN ESSENTIAL Blood pressure at goal in the office. I suspect malfunction of her home blood pressure monitor versus masked hypertension.  She also endorses chest pain 2 days ago and this morning. However, chest pain is not exertional. She is also tender to palpation. I have low suspicion for ACS in this young female with no significant risk factor.  -Recommended follow-up with Dr. Raymondo BandKoval in a week or 2 for ambulatory blood pressure monitoring -Continue amlodipine  Hot flashes Concerning for premature ovarian failure. She had only 2 menstrual cycles in the last 12 months -[facility]  Precepted patient with Dr. Leveda AnnaHensel

## 2016-01-15 NOTE — Assessment & Plan Note (Signed)
Concerning for premature ovarian failure. She had only 2 menstrual cycles in the last 12 months [facility]

## 2016-01-16 LAB — FOLLICLE STIMULATING HORMONE: FSH: 39.2 m[IU]/mL

## 2016-01-19 ENCOUNTER — Telehealth: Payer: Self-pay | Admitting: Student

## 2016-01-19 ENCOUNTER — Encounter: Payer: Self-pay | Admitting: Student

## 2016-01-19 NOTE — Telephone Encounter (Signed)
Discussed about her lab results from her recent visits. Patient with symptoms of premature ovarian failure. FSH elevated. Advised patient to schedule an office visit to discuss about this. TSH within normal limit. Have also sent the results letter to the patient

## 2016-01-20 ENCOUNTER — Encounter: Payer: Self-pay | Admitting: Student

## 2016-01-20 ENCOUNTER — Ambulatory Visit (INDEPENDENT_AMBULATORY_CARE_PROVIDER_SITE_OTHER): Payer: Medicaid Other | Admitting: Student

## 2016-01-20 VITALS — BP 121/86 | HR 96 | Temp 98.3°F | Wt 163.0 lb

## 2016-01-20 DIAGNOSIS — E288 Other ovarian dysfunction: Secondary | ICD-10-CM | POA: Diagnosis not present

## 2016-01-20 DIAGNOSIS — E2839 Other primary ovarian failure: Secondary | ICD-10-CM

## 2016-01-20 DIAGNOSIS — F17200 Nicotine dependence, unspecified, uncomplicated: Secondary | ICD-10-CM | POA: Diagnosis not present

## 2016-01-20 MED ORDER — VARENICLINE TARTRATE 0.5 MG X 11 & 1 MG X 42 PO MISC: ORAL | 0 refills | Status: DC

## 2016-01-20 NOTE — Assessment & Plan Note (Addendum)
FSH elevated to menopausal level.The news of this is hard on her but she had suspicion with the menopausal symptoms she has had. We have discussed about the risks and benefits of hormonal therapy. Untreated premature ovarian failure can increase her risk of osteoporosis, cardiovascular morbidity/mortality, impaired cognition, mood changes. She is already experiencing hot flashes, pain with sexual intercourse and mood swings although the later could be due to his psychiatric condition (depression and bipolar disorder). Hormonal therapy can increase her risk of endometrial hyperplasia/cancer, breast cancer or DVT although the benefit overweighs the risk. She has no history of heart disease, DVT, migraine headache with aura or liver disease. Patient will return in a week or two start hormonal therapy and discuss progress on smoking.

## 2016-01-20 NOTE — Patient Instructions (Signed)
It was great seeing you today! We have addressed the following issues today  1. Smoking: please take the prescription for Chantix to pharmacy and have it filled. You're quit date is 01/27/2016. You can also call 1800-QUIT-NOW for help with stopping smoking.  2. Hot flashes: please come back and see us in one week or two. We will discuss about the medication and start the treatment. It is very important that your quit smoking.    If we did any lab work today, and the results require attention, either me or my nurse will get in touch with you. If everything is normal, you will get a letter in mail. If you don't hear from us in two weeks, please give us a call. Otherwise, we look forward to seeing you again at your next visit. If you have any questions or concerns before then, please call the clinic at [phone].   Please bring all your medications to every doctors visit   Sign up for My Chart to have easy access to your labs results, and communication with your Primary care physician.     Please check-out at the front desk before leaving the clinic.

## 2016-01-20 NOTE — Assessment & Plan Note (Signed)
Patient reports smoking about 5-6 cigarettes a day. She has 5 pack year smoking history. I discussed about her increased risk of DVT on hormonal therapy due to smoking. She is interested in quitting smoking. She tried nicotine patch and lozenges in the past without success. She is interested in trying Chantix. So I gave her prescription for Chantix starter. Quit date is 01/27/2016. She also have an appointment with Dr. Raymondo BandKoval for a blood pressure monitoring. I encouraged her to discuss about his smoking at that visit. I also gave her quit line number.

## 2016-01-20 NOTE — Progress Notes (Signed)
   Subjective:    Patient ID: [name] is a 35 y.o. old female. Patient is here for follow-up on his elevated FSH HPI #Elevated FSH: patient presented to clinic for blood pressure issues about a week ago. At that time she reported having hot flash and irregularities with menstrual period. FSH and TSH when checked. FSH was elevated to menopausal level. TSH was within normal limits.   She reports hot flash for about year but worse over the last three weeks. She also reports night sweat. She is sexually active. Reports pain with intercourse over the last one year. She also reports having worsening mood swings issues. She had history of depression and bipolar disorder. Last menstrual period was 09/05/2015. Her menstrual periods prior to that was about a year ago   PMH: denies history of heart disease, DVT, migraine with aura or  liver disease  SH: 5pack year. 5 cig/day for 20 year. Occasional EtOH. Smoked Marijuana last night after the news [facility]  Review of Systems Per HPI Objective:   Vitals:   01/20/16 1354  BP: 121/86  Pulse: 96  Temp: 98.3 F (36.8 C)  TempSrc: Oral  Weight: 163 lb (73.9 kg)    GEN: appears well, no apparent distress. Oropharynx: mmm without erythema or exudation CVS: RRR, normal s1 and s2, no murmurs, no edema RESP: no increased work of breathing, good air movement bilaterally, no crackles or wheeze GI: Bowel sounds present and normal, soft, non-tender,non-distended SKIN: no apparent skin lesion ENDO: negative for thyromegaly NEURO: alert and oriented appropriately, no gross defecits  PSYCH: appropriate mood and affect. Became tearful briefly while [facility]    Assessment & Plan:  Premature ovarian failure FSH elevated to menopausal level.The news of this is hard on her but she had suspicion with the menopausal symptoms she has had. We have discussed about the risks and benefits of hormonal therapy. Untreated premature  ovarian failure can increase her risk of osteoporosis, cardiovascular morbidity/mortality, impaired cognition, mood changes. She is already experiencing hot flashes, pain with sexual intercourse and mood swings although the later could be due to his psychiatric condition (depression and bipolar disorder). Hormonal therapy can increase her risk of endometrial hyperplasia/cancer, breast cancer or DVT although the benefit overweighs the risk. She has no history of heart disease, DVT, migraine headache with aura or liver disease. Patient will return in a week or two start hormonal therapy and discuss progress on smoking.   TOBACCO DEPENDENCE Patient reports smoking about 5-6 cigarettes a day. She has 5 pack year smoking history. I discussed about her increased risk of DVT on hormonal therapy due to smoking. She is interested in quitting smoking. She tried nicotine patch and lozenges in the past without success. She is interested in trying Chantix. So I gave her prescription for Chantix starter. Quit date is 01/27/2016. She also have an appointment with Dr. Raymondo BandKoval for a blood pressure monitoring. I encouraged her to discuss about his smoking at that visit. I also gave her quit line number.

## 2016-01-22 ENCOUNTER — Encounter: Payer: Self-pay | Admitting: Pharmacist

## 2016-01-22 ENCOUNTER — Ambulatory Visit (INDEPENDENT_AMBULATORY_CARE_PROVIDER_SITE_OTHER): Payer: Medicaid Other | Admitting: Pharmacist

## 2016-01-22 DIAGNOSIS — E288 Other ovarian dysfunction: Secondary | ICD-10-CM

## 2016-01-22 DIAGNOSIS — E2839 Other primary ovarian failure: Secondary | ICD-10-CM

## 2016-01-22 DIAGNOSIS — F17200 Nicotine dependence, unspecified, uncomplicated: Secondary | ICD-10-CM

## 2016-01-22 DIAGNOSIS — I10 Essential (primary) hypertension: Secondary | ICD-10-CM | POA: Diagnosis not present

## 2016-01-22 NOTE — Patient Instructions (Addendum)
No change with your medications today. Your blood pressure results from the last 24 hours were good. Thank you for visiting the Family Medicine clinic.

## 2016-01-22 NOTE — Progress Notes (Signed)
S:    Patient arrives stating she is scared to start the hormone therapy following her visit with Dr Alanda SlimGonfa on 01/20/16 where lab testing revealed elevated FSH and she was diagnosed with premature ovarian failure.  Today she presents to the clinic for ambulatory blood pressure evaluation.  Patient was referred on 01/15/16 due to having elevated BP at home but normal readings in clinic.  Patient was last seen by Primary Care Provider on 01/20/16.   Medication compliance is reported to be good.  Discussed procedure for wearing the monitor and gave patient written instructions. Monitor was placed on non-dominant arm with instructions to return in the morning.   Current BP Medications include:  Amlodipine 5 mg daily   Antihypertensives tried in the past include: none  Patient returned to the clinic and no issues with meter.   Age when started using tobacco on a daily basis 15. Number of Cigarettes per day normally 5 (recently increased to 7-8 per day following stress from starting HRT).  Has smoked for 20 years (prevously 1.5 ppd) but has recently started decreasing to the 5 per day. Brand smoked [place] 100s.  Smokes first cigarette 60 minutes after waking. Only smokes at night if she cannot sleep.     Medications (NRT, bupropion, varenicline) used in prior in past cessation efforts include: Nicotine patches and Lozenges.  Dr Alanda SlimGonfa prescribed Chantix but she has not yet started.   Rates IMPORTANCE of quitting tobacco on 1-10 scale of 10. Rates CONFIDENCE of quitting tobacco on 1-10 scale of 6-7 over the next 2 week.  Most common triggers to use tobacco include: Stress, food, using the restroom (smokes after food going to the restroom)   Motivation to quit: Her children but does state that her husband also smokes.   Her quit date is currently 01/27/16 but due to the stress from the premature ovarian failure she would like to move it to 02/04/16 and she is confident that she can decrease her  cigarette use by then.   O:   Last 3 Office BP readings: BP Readings from Last 3 Encounters:  01/20/16 121/86  01/15/16 (!) 128/92  01/13/16 129/81   ABPM Study Data: Arm Placement left arm   Overall Mean 24hr BP:   118/79 mmHg HR: 88   Daytime Mean BP:  124/85 mmHg HR: 91   Nighttime Mean BP:  103/64 mmHg HR: 78   Dipping Pattern: Yes.    Sys:   17.1%   Dia: 23.9%   [normal dipping ~10-20%]  Blood pressure monitor capture rate >95% Non-hypertensive ABPM thresholds: daytime BP <135/85 mmHg, sleeptime BP <120/70 mmHg NICE Hypertension Guidelines ([place] using ABPM: Stage I: >135/85 mmHg, Stage 2: >150/95 mmHg)   BMET    Component Value Date/Time   NA 139 01/12/2016 0851   K 3.6 01/12/2016 0851   CL 109 01/12/2016 0851   CO2 21 (L) 01/12/2016 0851   GLUCOSE 118 (H) 01/12/2016 0851   BUN 8 01/12/2016 0851   CREATININE 0.69 01/12/2016 0851   CREATININE 0.91 01/02/2016 1554   CALCIUM 9.8 01/12/2016 0851   GFRNONAA >60 01/12/2016 0851   GFRNONAA 81 01/02/2016 1554   GFRAA >60 01/12/2016 0851   GFRAA >89 01/02/2016 1554    A/P: Premature Ovarian Failure: Elevated FSH at last visit with history of hot flashes and irregularities in with her menstrual period (no menstrual cycle for 2-3 months).  Dr Alanda SlimGonfa had planned on initiating Hormone replacement therapy today but patient is very  hesitant to start the medication due to her father and grandmother having a history of blood clots.  Consulted Dr Lum BabeEniola for further evaluation and discussion with the patient and Dr Lum BabeEniola will place referral to the OB/GYN that performed her bilateral salpingectomy.    Moderate Nicotine Dependence of 20 years duration in a patient who is good candidate for success b/c of motivation to quit. .  Patient will start varenicline tx starter pack. Patient counseled on purpose, proper use, and potential adverse effects, including GI upset, and potential change in mood. Her quit date is currently 01/27/16 but  due to the stress from the premature ovarian failure she would like to move it to 02/04/16 and she is confident that she can decrease her cigarette use by then.   History of hypertension. Given 24-hour ambulatory blood pressure demonstrates blood pressures at goal with an average blood pressure of 118/79 mmHg, and a nocturnal dipping pattern that is normal.  No changes to medications. Continue amlodipine 5mg  daily. Also taking prazosin 1mg  every evening.   Results reviewed and written information provided.  Total time in face-to-face counseling 45 minutes.   F/U Clinic Visit with Dr. Alanda SlimGonfa on 01/27/16.  Patient seen with Alphonzo Severanceyan Ragan, PharmD Candidate,  Rosalita ChessmanKenny Kang, PharmD PGY1 Resident and Hazle NordmannKelsy Combs, PharmD PGY2 Resident

## 2016-01-23 NOTE — Progress Notes (Signed)
Patient ID: [name], female   DOB: Nov 20, 1980, 35 y.o.   MRN: [id number] Reviewed: Agree with Dr. Macky LowerKoval's documentation and management.

## 2016-01-23 NOTE — Progress Notes (Signed)
Patient discussed with me. I went in to talk to her after reviewing her notes and labs. She has one time elevation of FSH. No LH or AMH checked. She might well be having premature ovarian failure. However, I don't think there is urgency in initiating medication till she gets full work up and counseling. I recommended referral to Gyn or endocrinologist. She has follow up with her PCP next week, as discussed with her, information would be passed on to her PCP to complete referral process. She agreed with plan. She is not ready for hormone treatment at this time given concern for DVT.   Will forward note to her PCP and Dr. Alanda SlimGonfa for further management.

## 2016-01-23 NOTE — Assessment & Plan Note (Signed)
Premature Ovarian Failure: Elevated FSH at last visit with history of hot flashes and irregularities in with her menstrual period (no menstrual cycle for 2-3 months).  Dr Alanda SlimGonfa had planned on initiating Hormone replacement therapy today but patient is very hesitant to start the medication due to her father and grandmother having a history of blood clots.  Consulted Dr Lum BabeEniola for further evaluation and discussion with the patient and Dr Lum BabeEniola will place referral to the OB/GYN that performed her bilateral salpingectomy.

## 2016-01-23 NOTE — Assessment & Plan Note (Signed)
History of hypertension. Given 24-hour ambulatory blood pressure demonstrates blood pressures at goal with an average blood pressure of 118/79 mmHg, and a nocturnal dipping pattern that is normal.  No changes to medications. Continue amlodipine 5mg  daily. Also taking prazosin 1mg  every evening.

## 2016-01-23 NOTE — Assessment & Plan Note (Signed)
Moderate Nicotine Dependence of 20 years duration in a patient who is good candidate for success b/c of motivation to quit. .  Patient will start varenicline tx starter pack. Patient counseled on purpose, proper use, and potential adverse effects, including GI upset, and potential change in mood. Her quit date is currently 01/27/16 but due to the stress from the premature ovarian failure she would like to move it to 02/04/16 and she is confident that she can decrease her cigarette use by then.

## 2016-01-26 ENCOUNTER — Telehealth: Payer: Self-pay | Admitting: Student

## 2016-01-26 NOTE — Telephone Encounter (Signed)
Called patient and recommended rescheduling her apt in two weeks ([facility] We will repeat FSH and do additional testing as recommended for further evaluation of her POI before we start treatment or refer to Gyn. Patient voiced understanding and appreciated the call.

## 2016-01-27 ENCOUNTER — Ambulatory Visit: Payer: Medicaid Other | Admitting: Student

## 2016-01-28 ENCOUNTER — Other Ambulatory Visit: Payer: Self-pay | Admitting: Student

## 2016-02-02 ENCOUNTER — Encounter: Payer: Self-pay | Admitting: Family Medicine

## 2016-02-02 NOTE — Progress Notes (Signed)
Office Visit with Shirley Hakim, MD (Pain Medicine) on 01/28/16 Referral from Dr Vickie Epley (NS)  A/ Lumbar Spondylosis - Hx and exam c/w axial back pain, loading symptoms, and musculoskeletal involvment  Plan Repeat medial branch block. If 50% or greater improvement, even if short-lived, would consider rhizotomy.  If extended interval of pain relief, of 2-3 months, would repeat injections in that intervals  Recommend better stretching program, e.g. Yoga, tai chi to improve musculoskeletal pain component  Continue meolxican, gabapentin and limited Norco for now.  Anticipate transition to other medications or wean the amount of opioid she is using now.

## 2016-02-12 ENCOUNTER — Other Ambulatory Visit: Payer: Self-pay | Admitting: Family Medicine

## 2016-02-12 ENCOUNTER — Ambulatory Visit (INDEPENDENT_AMBULATORY_CARE_PROVIDER_SITE_OTHER): Payer: Medicaid Other | Admitting: Family Medicine

## 2016-02-12 ENCOUNTER — Encounter: Payer: Self-pay | Admitting: Family Medicine

## 2016-02-12 VITALS — BP 115/75 | HR 87 | Temp 98.6°F | Ht 66.0 in | Wt 165.0 lb

## 2016-02-12 DIAGNOSIS — N912 Amenorrhea, unspecified: Secondary | ICD-10-CM | POA: Diagnosis present

## 2016-02-12 DIAGNOSIS — F17200 Nicotine dependence, unspecified, uncomplicated: Secondary | ICD-10-CM

## 2016-02-12 MED ORDER — VARENICLINE TARTRATE 1 MG PO TABS: 1.0000 mg | ORAL_TABLET | Freq: Two times a day (BID) | ORAL | 0 refills | Status: DC

## 2016-02-12 NOTE — Patient Instructions (Signed)
We are rechecking lab work for premature menopause today.   If it confirms your climacteric status (early stage of menopause) then we will start you on a topical estrogen to use inside your vaginal to decrease pain with intercourse.

## 2016-02-13 ENCOUNTER — Telehealth: Payer: Self-pay | Admitting: Family Medicine

## 2016-02-13 ENCOUNTER — Encounter: Payer: Self-pay | Admitting: Family Medicine

## 2016-02-13 DIAGNOSIS — N951 Menopausal and female climacteric states: Secondary | ICD-10-CM

## 2016-02-13 LAB — FSH/LH
FSH: 11.8 m[IU]/mL
LH: 5.3 m[IU]/mL

## 2016-02-13 NOTE — Progress Notes (Addendum)
Subjective:  [name] is alone Sources of clinical information for visit is/are patient and past medical records. Nursing assessment for this office visit was reviewed with the patient for accuracy and revision.    [name] is a 35 y.o. female who presents for amenorrhea and elevated TSH. The patient has no complaints today. The patient is sexually active. GYN screening history: s/p hysterectomy 2013 by Dr Pennie RushingHaygood. The patient is not taking hormone replacement therapy. Patient reports  vaginal bleeding soaking pad 2 weeks ago.  Bleeding is not cyclic. (+) dyspareunia.    Menstrual History: OB History    Gravida Para Term Preterm AB Living   4 3 1 2 1 3    SAB TAB Ectopic Multiple Live Births   1       3      Maternal Menarche age: [age] Patient's last menstrual period was 01/26/2016 (approximate).    The following portions of the patient's history were reviewed and updated as appropriate: allergies, current medications, past family history, past medical history, past social history, past surgical history and problem list.  Tobacco Dependence - Longstaning problem - Began Chantix last month - Down to less than 5 cig a day - would like to continue the chanitx for additional month.    Review of Systems A comprehensive review of systems was negative except for: Genitourinary: positive for vaginal bleed    Objective:     VS reviewed GEN: Alert, Cooperative, Groomed, NAD HEENT: PERRL; EAC bilaterally not occluded, TM's translucent with normal LM, (+) LR;                No cervical LAN, No thyromegaly, No palpable masses COR: RRR, No M/G/R, No JVD, Normal PMI size and location LUNGS: BCTA, No Acc mm use, speaking in full sentences ABDOMEN: (+)BS, soft, NT, ND, No HSM, No palpable masses Gait: Normal speed, No significant path deviation, Step through +,  Psych: Normal affect/thought/speech/language      Assessment:    Climacteric   Tobacco Dependence: decreasing  tobacco use with start of Chantix.  Tolerating chantix.    Plan:    FSH, FSH/LH, serum estradiol.   Clarify if [name] has had a hysterectomy.  If so, why vaginal bleeding.  Pt declined low dose estrogen therapy bc our her fear of DVT given her father had dvt..  Also, patient smokes If labs c/w climateric, will recommend topical intravaginal estrogen to treat dyspareunia.

## 2016-02-13 NOTE — Telephone Encounter (Signed)
Will forward to MD to write this note and we can print it off for him. Dailey Alberson,CMA

## 2016-02-13 NOTE — Telephone Encounter (Signed)
Pt is calling because she needs a letter stating that she can not work at this time. She needs this for child support court. Please call patient to get more details on what needs to be said. jw

## 2016-02-16 ENCOUNTER — Encounter: Payer: Self-pay | Admitting: Family Medicine

## 2016-02-16 NOTE — Telephone Encounter (Signed)
Requested letter written and mailed to patient.

## 2016-02-17 ENCOUNTER — Encounter: Payer: Self-pay | Admitting: Family Medicine

## 2016-02-17 DIAGNOSIS — N951 Menopausal and female climacteric states: Secondary | ICD-10-CM | POA: Insufficient documentation

## 2016-02-17 HISTORY — DX: Menopausal and female climacteric states: N95.1

## 2016-02-17 LAB — ESTRADIOL: Estradiol: 143 pg/mL

## 2016-02-17 MED ORDER — ESTROGENS, CONJUGATED 0.625 MG/GM VA CREA: TOPICAL_CREAM | VAGINAL | 12 refills | Status: DC

## 2016-02-17 NOTE — Telephone Encounter (Signed)
I spoke with [name] about her FSH and LH results being in normal range. Change from high to normal range FSH c/w climacteric.  [name] does not want to use a low dose OCP for symptoms of climacteric / premenopausal vasomotor symptoms.  She does want to use Premarin topical for vaginal dryness and dyspareunia. Patient is S/P bilateral salpingectomy.  Premarin cream sent into her pharmacy.   Patient told work capacity letter should arrive at her home in next couple days.

## 2016-02-18 ENCOUNTER — Ambulatory Visit: Payer: Medicaid Other | Admitting: Student

## 2016-02-20 NOTE — Telephone Encounter (Signed)
Pt would like Dr. McDiarmid to call her regarding letter. Please advise. Thanks! ep

## 2016-02-23 ENCOUNTER — Encounter: Payer: Self-pay | Admitting: Podiatry

## 2016-02-23 ENCOUNTER — Ambulatory Visit (INDEPENDENT_AMBULATORY_CARE_PROVIDER_SITE_OTHER): Payer: Medicaid Other | Admitting: Podiatry

## 2016-02-23 ENCOUNTER — Telehealth: Payer: Self-pay | Admitting: *Deleted

## 2016-02-23 VITALS — BP 108/76 | HR 91 | Resp 16 | Ht 66.0 in | Wt 168.0 lb

## 2016-02-23 DIAGNOSIS — L600 Ingrowing nail: Secondary | ICD-10-CM | POA: Diagnosis not present

## 2016-02-23 NOTE — Progress Notes (Signed)
   Subjective:    Patient ID: [name], female    DOB: 01/15/1981, 35 y.o.   MRN: [id number]  HPI Chief Complaint  Patient presents with  . Nail Problem    Bilateral; great toes-medial; pt stated,"Left foot hurts more than Right"; x1 yr      Review of Systems  All other systems reviewed and are negative.      Objective:   Physical Exam        Assessment & Plan:

## 2016-02-23 NOTE — Telephone Encounter (Signed)
Pt states she has soaked through the dressing put on by Dr. Charlsie Merlesegal today. I spoke with pt and she states she just left our office and they redressed the toe. I apologized for the late call and encouraged pt to rest, and elevate the foot and if possible wear open toed or loose fitting shoes to decrease pressure to the toe. Pt states understanding.

## 2016-02-23 NOTE — Patient Instructions (Signed)

## 2016-02-23 NOTE — Progress Notes (Signed)
Subjective:     Patient ID: [name], female   DOB: [birth year], 35 y.o.   MRN: [id number]  HPI patient presents stating the nail borders on both feet are painful and making it hard to wear shoe gear comfortably. States the left hurts more than the right and she's tried to trim it and had pedicures without relief   Review of Systems  All other systems reviewed and are negative.      Objective:   Physical Exam  Constitutional: She is oriented to person, place, and time.  Cardiovascular: Intact distal pulses.   Musculoskeletal: Normal range of motion.  Neurological: She is oriented to person, place, and time.  Skin: Skin is warm.  Nursing note and vitals reviewed.  neurovascular status intact muscle strength adequate range of motion within normal limits with patient found to have incurvation of the hallux bilateral medial border with the left being worse than the right. Patient's found have good digital perfusion and is well oriented 3 with no other pathology     Assessment:     Ingrown toenail deformity left hallux medial border and mild deformity right hallux medial border    Plan:     H&P discussed and reviewed and I've recommended correction of the nail border. I allowed patient to understand the risk of procedure and at this time I injected the left hallux with 60 Milligan times like Marcaine mixture remove the medial border exposed matrix and applied phenol 3 applications 30 seconds followed by alcohol lavage and sterile dressing. Given instructions on soaks and reappoint

## 2016-02-24 ENCOUNTER — Telehealth: Payer: Self-pay | Admitting: *Deleted

## 2016-02-24 NOTE — Telephone Encounter (Signed)
[name] asked that her musculoskeletal conditions be included in her letter seeking disability.  I advised [name] that I can include the musculoskeletal conditions in the letter, but I am unable to qualify how impairing the conditions are.   [facility] front desk for pick up Will also mail copy to pt at her home address.

## 2016-02-24 NOTE — Telephone Encounter (Signed)
Pt is wanting dr Perley Jainmcdiarmid to call her about letter he gave her. Attorney wants the dr to make some modifications. Please advise. Deseree Bruna PotterBlount, CMA

## 2016-02-25 ENCOUNTER — Other Ambulatory Visit: Payer: Self-pay | Admitting: Family Medicine

## 2016-03-17 ENCOUNTER — Other Ambulatory Visit: Payer: Self-pay | Admitting: Family Medicine

## 2016-03-19 ENCOUNTER — Ambulatory Visit (INDEPENDENT_AMBULATORY_CARE_PROVIDER_SITE_OTHER): Payer: Medicaid Other | Admitting: *Deleted

## 2016-03-19 DIAGNOSIS — M5442 Lumbago with sciatica, left side: Secondary | ICD-10-CM | POA: Diagnosis present

## 2016-03-19 DIAGNOSIS — G8929 Other chronic pain: Secondary | ICD-10-CM | POA: Diagnosis not present

## 2016-03-19 DIAGNOSIS — G894 Chronic pain syndrome: Secondary | ICD-10-CM | POA: Diagnosis not present

## 2016-03-19 MED ORDER — KETOROLAC TROMETHAMINE 60 MG/2ML IM SOLN
60.0000 mg | Freq: Once | INTRAMUSCULAR | Status: AC
  Administered 2016-03-19: 60 mg via INTRAMUSCULAR

## 2016-03-19 NOTE — Progress Notes (Signed)
   Patient in nurse clinic for Toradol injection due to severe back pain.  Patient has a standing order given by Dr. McDiarmid for Toradol 60 mg/482ml once a week from nursing staff.  Patient has been in pain x 4-5 Days. Injection given RUOQ. Patient stated she tried calling her pain management provider, but the provider is out on vacation.   Advised patient to follow with PCP.  Clovis PuMartin, [place] Canty L, RN

## 2016-03-24 ENCOUNTER — Telehealth: Payer: Self-pay | Admitting: Family Medicine

## 2016-03-24 NOTE — Telephone Encounter (Signed)
Would like to talk to dr mcdiarmid about a situation she has about a bill she has received about her fathers estate. Please advise

## 2016-04-27 ENCOUNTER — Non-Acute Institutional Stay (INDEPENDENT_AMBULATORY_CARE_PROVIDER_SITE_OTHER): Payer: Medicaid Other | Admitting: Family Medicine

## 2016-04-27 DIAGNOSIS — M47817 Spondylosis without myelopathy or radiculopathy, lumbosacral region: Secondary | ICD-10-CM

## 2016-04-27 DIAGNOSIS — M1288 Other specific arthropathies, not elsewhere classified, other specified site: Secondary | ICD-10-CM

## 2016-04-27 NOTE — Progress Notes (Signed)
Office Visit note (04/23/16) with Mallie DartingPaul Harkin, MD (Pain Management)  Provider [facility]ela [facility]  Pt received greater than 60% relief short term with two consecutive medial branch blocks.  She is scheduled fo a L4/L5/S1 radiofrequency ablation.

## 2016-04-29 ENCOUNTER — Emergency Department (HOSPITAL_COMMUNITY)
Admission: EM | Admit: 2016-04-29 | Discharge: 2016-04-29 | Disposition: A | Discharge status: Home / Self Care | Payer: Medicaid Other | Attending: Emergency Medicine | Admitting: Emergency Medicine

## 2016-04-29 ENCOUNTER — Encounter (HOSPITAL_COMMUNITY): Payer: Self-pay | Admitting: Emergency Medicine

## 2016-04-29 DIAGNOSIS — F17210 Nicotine dependence, cigarettes, uncomplicated: Secondary | ICD-10-CM | POA: Diagnosis not present

## 2016-04-29 DIAGNOSIS — J45909 Unspecified asthma, uncomplicated: Secondary | ICD-10-CM | POA: Diagnosis not present

## 2016-04-29 DIAGNOSIS — I10 Essential (primary) hypertension: Secondary | ICD-10-CM | POA: Diagnosis not present

## 2016-04-29 DIAGNOSIS — R45851 Suicidal ideations: Secondary | ICD-10-CM | POA: Insufficient documentation

## 2016-04-29 DIAGNOSIS — R109 Unspecified abdominal pain: Secondary | ICD-10-CM | POA: Diagnosis present

## 2016-04-29 DIAGNOSIS — F250 Schizoaffective disorder, bipolar type: Secondary | ICD-10-CM | POA: Diagnosis present

## 2016-04-29 DIAGNOSIS — Z79899 Other long term (current) drug therapy: Secondary | ICD-10-CM | POA: Diagnosis not present

## 2016-04-29 DIAGNOSIS — F410 Panic disorder [episodic paroxysmal anxiety] without agoraphobia: Secondary | ICD-10-CM | POA: Diagnosis present

## 2016-04-29 LAB — ETHANOL

## 2016-04-29 LAB — COMPREHENSIVE METABOLIC PANEL
ALBUMIN: 4.4 g/dL (ref 3.5–5.0)
ALT: 12 U/L — ABNORMAL LOW (ref 14–54)
AST: 13 U/L — ABNORMAL LOW (ref 15–41)
Alkaline Phosphatase: 54 U/L (ref 38–126)
Anion gap: 7 (ref 5–15)
BILIRUBIN TOTAL: 1 mg/dL (ref 0.3–1.2)
BUN: 11 mg/dL (ref 6–20)
CO2: 26 mmol/L (ref 22–32)
Calcium: 9.8 mg/dL (ref 8.9–10.3)
Chloride: 107 mmol/L (ref 101–111)
Creatinine, Ser: 0.77 mg/dL (ref 0.44–1.00)
GFR calc Af Amer: >60 mL/min (ref >60)
GLUCOSE: 95 mg/dL (ref 65–99)
POTASSIUM: 3.5 mmol/L (ref 3.5–5.1)
Sodium: 140 mmol/L (ref 135–145)
TOTAL PROTEIN: 7.4 g/dL (ref 6.5–8.1)

## 2016-04-29 LAB — CBC
HCT: 41 % (ref 36.0–46.0)
Hemoglobin: 14.3 g/dL (ref 12.0–15.0)
MCH: 30.1 pg (ref 26.0–34.0)
MCHC: 34.9 g/dL (ref 30.0–36.0)
MCV: 86.3 fL (ref 78.0–100.0)
PLATELETS: 246 10*3/uL (ref 150–400)
RBC: 4.75 MIL/uL (ref 3.87–5.11)
RDW: 13.4 % (ref 11.5–15.5)
WBC: 8.1 10*3/uL (ref 4.0–10.5)

## 2016-04-29 LAB — RAPID URINE DRUG SCREEN, HOSP PERFORMED
Amphetamines: NOT DETECTED
BENZODIAZEPINES: NOT DETECTED
Barbiturates: NOT DETECTED
COCAINE: NOT DETECTED
OPIATES: NOT DETECTED
Tetrahydrocannabinol: POSITIVE — AB

## 2016-04-29 LAB — SALICYLATE LEVEL

## 2016-04-29 LAB — ACETAMINOPHEN LEVEL

## 2016-04-29 MED ORDER — GABAPENTIN 300 MG PO CAPS: 600.0000 mg | ORAL_CAPSULE | Freq: Three times a day (TID) | ORAL | Status: DC

## 2016-04-29 MED ORDER — IBUPROFEN 200 MG PO TABS: 600.0000 mg | ORAL_TABLET | Freq: Three times a day (TID) | ORAL | Status: DC | PRN

## 2016-04-29 MED ORDER — ACETAMINOPHEN 325 MG PO TABS
650.0000 mg | ORAL_TABLET | ORAL | Status: DC | PRN
  Administered 2016-04-29: 650 mg via ORAL
  Filled 2016-04-29: qty 2

## 2016-04-29 MED ORDER — HYDROXYZINE PAMOATE 50 MG PO CAPS: 100.0000 mg | ORAL_CAPSULE | Freq: Two times a day (BID) | ORAL | Status: DC | PRN

## 2016-04-29 MED ORDER — ALUM & MAG HYDROXIDE-SIMETH 200-200-20 MG/5ML PO SUSP: 30.0000 mL | ORAL | Status: DC | PRN

## 2016-04-29 MED ORDER — VENLAFAXINE HCL ER 37.5 MG PO TB24: 1.0000 | ORAL_TABLET | Freq: Two times a day (BID) | ORAL | Status: DC

## 2016-04-29 MED ORDER — ALBUTEROL SULFATE HFA 108 (90 BASE) MCG/ACT IN AERS: 2.0000 | INHALATION_SPRAY | Freq: Four times a day (QID) | RESPIRATORY_TRACT | Status: DC | PRN

## 2016-04-29 MED ORDER — AMLODIPINE BESYLATE 5 MG PO TABS: 5.0000 mg | ORAL_TABLET | Freq: Every day | ORAL | Status: DC

## 2016-04-29 MED ORDER — PANTOPRAZOLE SODIUM 40 MG PO TBEC: 40.0000 mg | DELAYED_RELEASE_TABLET | Freq: Every day | ORAL | Status: DC

## 2016-04-29 MED ORDER — LORAZEPAM 1 MG PO TABS: 1.0000 mg | ORAL_TABLET | Freq: Three times a day (TID) | ORAL | Status: DC | PRN

## 2016-04-29 MED ORDER — ZIPRASIDONE HCL 20 MG PO CAPS: 80.0000 mg | ORAL_CAPSULE | Freq: Two times a day (BID) | ORAL | Status: DC

## 2016-04-29 MED ORDER — LORAZEPAM 1 MG PO TABS
1.0000 mg | ORAL_TABLET | Freq: Once | ORAL | Status: AC
  Administered 2016-04-29: 1 mg via ORAL
  Filled 2016-04-29: qty 1

## 2016-04-29 MED ORDER — ONDANSETRON HCL 4 MG PO TABS: 4.0000 mg | ORAL_TABLET | Freq: Three times a day (TID) | ORAL | Status: DC | PRN

## 2016-04-29 NOTE — ED Notes (Signed)
[facility] Expected date:  Expected time:  Means of arrival:  Comments: Rm 29
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
## 2016-04-29 NOTE — ED Triage Notes (Signed)
Per EMS: Pt got news this morning that one of her family members passed away.  Had a panic attack this morning.  One of her daughters expressed that she has been very depressed recently.  Expressed to paramedic that she is suicidal and has had suicide attempts over the past 2 years.

## 2016-04-29 NOTE — BH Assessment (Signed)
[facility] Assessment Progress Note  Per Nanine MeansJamison Lord, DNP, this pt does not require psychiatric hospitalization at this time.  [facility].  This has been included in pt's discharge instructions.  Pt's nurse has been notified.  Doylene Canninghomas Amyjo Mizrachi, MA Triage Specialist [phone]

## 2016-04-29 NOTE — ED Notes (Signed)
Pt d/c home per MD order. This nurse reviewed discharge summary with pt. Pt verbalizes understanding of discharge summary and outpatient resources. Pt denies SI/HI. Pt signed for personal property and property returned. Pt signed e-signature. Ambulatory off unit with [facility].

## 2016-04-29 NOTE — Discharge Instructions (Signed)
For your ongoing mental health needs, you are advised to follow up with [facility].  If you do not currently have an appointment, new and returning patients are seen at their walk-in clinic.  Walk-in hours are Monday - Friday from 8:00 am - 3:00 pm.  Walk-in patients are seen on a first come, first served basis.  Try to arrive as early as possible for he best chance of being seen the same day: ° °     [facility] °     [address] °     , [place] [address] °     [phone] °
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
## 2016-05-05 ENCOUNTER — Encounter: Payer: Self-pay | Admitting: Family Medicine

## 2016-05-05 ENCOUNTER — Ambulatory Visit (INDEPENDENT_AMBULATORY_CARE_PROVIDER_SITE_OTHER): Payer: Medicaid Other | Admitting: Family Medicine

## 2016-05-05 VITALS — BP 118/82 | HR 87 | Temp 98.5°F | Ht 66.0 in | Wt 170.0 lb

## 2016-05-05 DIAGNOSIS — IMO0001 Reserved for inherently not codable concepts without codable children: Secondary | ICD-10-CM

## 2016-05-05 DIAGNOSIS — J111 Influenza due to unidentified influenza virus with other respiratory manifestations: Secondary | ICD-10-CM

## 2016-05-05 DIAGNOSIS — J029 Acute pharyngitis, unspecified: Secondary | ICD-10-CM

## 2016-05-05 LAB — POCT RAPID STREP A (OFFICE): RAPID STREP A SCREEN: NEGATIVE

## 2016-05-05 MED ORDER — OSELTAMIVIR PHOSPHATE 75 MG PO CAPS: 75.0000 mg | ORAL_CAPSULE | Freq: Two times a day (BID) | ORAL | 0 refills | Status: DC

## 2016-05-05 NOTE — Patient Instructions (Addendum)
I suspect you \\have  an influenza like illness so I am giving you oseltamivir twice a day for 5 days.  I would recommend over the counter tylenol and or ibuprofen for the aches and pains OR you could take the meloxicam once a day instead of the ibuprofen. You can still take 2 extra strength tylenol in addition to the ibuprofen or the meloxicam. Drink plenty of fluids. Rest Wear a mask if you are in crowded places for the next few days to decrease transmission to others. It is good you recived your flu shot as it may have made this illness less severe,

## 2016-05-05 NOTE — Progress Notes (Signed)
    CHIEF COMPLAINT / HPI:   Upper of the onset yesterday of sore throat, headache, fever. Laryngitis started last night and this morning she's unable to speak with normal voice. Having a lot of muscle aches. Temperature 100.1 at home. She did receive her flu shot. She has 3 children in her home, 2 of which have receive flu shot. The 37-year-old did not. She's also concerned that her 37-year-old has since upset yesterday  REVIEW OF SYSTEMS:  See history of present illness  OBJECTIVE:     Vital signs reviewed. GENERAL: Well-developed, well-nourished, no acute distress, obviously feeling poorly.Marland Kitchen. CARDIOVASCULAR: Regular rate and rhythm no murmur gallop or rub LUNGS: Clear to auscultation bilaterally, no rales or wheeze. ABDOMEN: Soft positive bowel sounds NEURO: No gross focal neurological deficits. MSK: Movement of extremity x 4. HEENT: Oropharynx is extremely red, swelling of the tonsillar area but airway is open. No exudate. Shotty lymphadenopathy bilateral neck in the anterior cervical chain. This 4 range of motion of the neck. TMs bilaterally somewhat retracted still mobile. Conjunctivae is nonicteric. LABORATORY: Rapid strep is negative.    ASSESSMENT / PLAN: Influenza-like illness in a patient who did receive influenza vaccination issue. We'll treat her with Tamiflu. I also agreed to give her 37-year-old son prophylaxis as he is patient of this practice. We discussed natural course of laryngitis which is not typical finding for influenza. 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

## 2016-05-06 ENCOUNTER — Telehealth: Payer: Self-pay | Admitting: Family Medicine

## 2016-05-06 NOTE — Telephone Encounter (Signed)
Pt needs a letter stating she is no longer contagious. Pt would like to pick it up today or tomorrow. Please call pt when it is ready. ep

## 2016-05-06 NOTE — Telephone Encounter (Signed)
Will forward to Dr. Jennette KettleNeal who saw this patient to confirm and we can print off the letter.  Jazmin Hartsell,CMA

## 2016-05-07 NOTE — Telephone Encounter (Signed)
Contacted pt and she stated that she has not had a fever the whole time she has been sick and per Dr. Jennette KettleNeal note she could have a note.  Notified pt note would be placed up front for her to pick up. Lamonte SakaiZimmerman Rumple, Leanore Biggers D, [place]

## 2016-05-07 NOTE — Telephone Encounter (Signed)
If she is afebrile at home yes she can have letter saying she is no longer contagious THANKS! Shirley LevySara Tyquon [name]

## 2016-05-20 ENCOUNTER — Other Ambulatory Visit: Payer: Self-pay | Admitting: Family Medicine

## 2016-05-20 DIAGNOSIS — F17200 Nicotine dependence, unspecified, uncomplicated: Secondary | ICD-10-CM

## 2016-06-10 ENCOUNTER — Other Ambulatory Visit: Payer: Self-pay | Admitting: Family Medicine

## 2016-07-13 ENCOUNTER — Ambulatory Visit (INDEPENDENT_AMBULATORY_CARE_PROVIDER_SITE_OTHER): Payer: Medicaid Other | Admitting: Family Medicine

## 2016-07-13 ENCOUNTER — Other Ambulatory Visit: Payer: Self-pay | Admitting: Family Medicine

## 2016-07-13 ENCOUNTER — Encounter: Payer: Self-pay | Admitting: Family Medicine

## 2016-07-13 VITALS — BP 118/90 | HR 65 | Temp 98.1°F | Ht 66.0 in | Wt 182.6 lb

## 2016-07-13 DIAGNOSIS — N951 Menopausal and female climacteric states: Secondary | ICD-10-CM | POA: Diagnosis present

## 2016-07-13 DIAGNOSIS — F411 Generalized anxiety disorder: Secondary | ICD-10-CM

## 2016-07-13 MED ORDER — VENLAFAXINE HCL 75 MG PO TABS: 75.0000 mg | ORAL_TABLET | Freq: Two times a day (BID) | ORAL | 1 refills | Status: DC

## 2016-07-13 MED ORDER — BLACK COHOSH 40 MG PO CAPS: 2.0000 | ORAL_CAPSULE | Freq: Every day | ORAL | 2 refills | Status: DC

## 2016-07-13 NOTE — Assessment & Plan Note (Addendum)
Suspect that stress with her significant mental health history is a large component of her recent worsening of her hot flashes. Discussed with patient and offered increasing her venlafaxine at this time. Patient was very much in agreement and stated she preferred to avoid estrogen unless she failed all other treatment options. Patient also wished to meet with [facility] and explore possibility for individual counseling today. - Increase venlafaxine to  BID - Continue black cohash - Discussed smoking cessation briefly to reduce risk of blood clots in case patient would like estrogen in the future. - Warm off to [facility], appreciate excellent care and recommendations.

## 2016-07-13 NOTE — Progress Notes (Signed)
    Subjective:  [name] is a 36 y.o. female who presents to the [facility] today with a chief complaint of hot flashes.    HPI:  Previously seen in November and had work up for hot flashes. Patient states she was offered estrogen at that time but declined d/t fear of blood clots. Her father had several blood clots in his lifetime from Afib and she witnessed how it negatively impacted his health. Has been on venlafaxine for mood and black cohosh with some benefit. Has been having 3-4 hotflashes a week over the last 6 months.   Is presenting today with worsening her hotflashes over the last 2-3 weeks. Now occurring every 30-65min, is keeping her awake at night. She is still very apprehensive about starting estrogen but feels that she may need for some relief.  She also states that her stress may be a contributing factor. Her relationship with her daughter is currently under some strain due to her worry for her daughter though she does not voice her specific concerns. She has followed with [facility] in the past but was only offered group therapy which was not a good fit for her. To cope, she has started smoking more recently, previously was 1-2 cigarettes per day and now has increased to 4-5 per day.   ROS: Per HPI  Objective:  Physical Exam: BP 118/90   Pulse 65   Temp 98.1 F (36.7 C) (Oral)   Ht  (1.676 m)   Wt 182 lb 9.6 oz (82.8 kg)   LMP 07/14/2015   SpO2 98%   BMI 29.47 kg/m   Gen: NAD, resting comfortably CV: RRR with no murmurs appreciated Pulm: NWOB, CTAB with no crackles, wheezes, or rhonchi Skin: warm, dry Neuro: grossly normal, moves all extremities Psych: Tearful with appropriate affect. Normal speech   Assessment/Plan:  Climacteric Suspect that stress with her significant mental health history is a large component of her recent worsening of her hot flashes. Discussed with patient and offered increasing her venlafaxine at this time. Patient was very much in  agreement and stated she preferred to avoid estrogen unless she failed all other treatment options. Patient also wished to meet with [facility] and explore possibility for individual counseling today. - Increase venlafaxine to  BID - Continue black cohash - Discussed smoking cessation briefly to reduce risk of blood clots in case patient would like estrogen in the future. - Warm off to [facility], appreciate excellent care and recommendations.  Will have patient follow up in 2 weeks to reassess.  Leland Her, DO PGY-1, [facility] 07/13/2016 11:38 AM

## 2016-07-13 NOTE — Patient Instructions (Addendum)
It was good to meet you today  For your hot flashes, - Please increase your venlafaxine to  twice a day - Continue taking your over the counter Block Cohosh, it has some estrogen-like activity without raising the risk of blood clots like estrogen.  Smoking cessation - As always there is help available 24/7 at 800-QUIT-NOW ([phone])  - Thank you for meeting with our [facility] consultant today.  Please try these for 2 weeks, I think it will really help but if not we can start estrogen at that time.  Take care and seek immediate care sooner if you develop any concerns.   Dr. Leland Her, DO  Family Medicine

## 2016-07-13 NOTE — Progress Notes (Addendum)
Dr. Artist Pais requested a Behavioral Health Consult.   Presenting Issue:  Stress  Report of symptoms:  Patient reports extensive mental health history and interest in counseling. States that she has experienced many deaths of people close to her in the last few years and this has been difficult emotionally and has made it hard for her to deal with stress. Most recently, her stepfather died in 19-May-2022 in a car accident. She states that her mental health is "all over the place." She described her history as "dark" and feels that depression and anxiety are big problems for her now. She feels that she doesn't do anything right and can't do things right and often feels stupid. Patient is bothered by voices that are not her own that tell her lots of different things depending on the situation, for example will tell her she isn't good enough, that she should hurt someone who she's mad at, or that she should hurt herself.   Psychiatric History - Diagnoses: per patient, Bipolar, Schizoaffective, Social Anxiety, PTSD, Anxiety, "severe depression" - Hospitalizations: per patient, admitted to behavioral health hospital in 2013 or 2014 after suicide attempt - Pharmacotherapy: per patient, haloperidol, ziprasidone, venlafaxine  - Outpatient therapy: per patient, attended counseling on and off in the past, no current therapist/counselor  Family history of psychiatric issues:  Patient thinks brother may have attempted suicide, however does not know much about family history because her family is private about those things  Current and history of substance use:  Currently smokes cigarettes, occasional alcohol use (notes used to be a heavy drinker but not anymore), past marijuana use (denies current use)  Medical conditions that might explain or contribute to symptoms:  Patient distressed by early menopause  Warmhandoff:    Warm Hand Off Completed.      Suicide Assessment  Plan: - How specific is the plan: n/a -  no plan at present - How lethal are the means: n/a/ - no plan at present - Does the patient have access to the means: no plan at present but in past has attempted overdose on medications which she does have access to - Does the patient have social support: some support reported from pastor, husband, and best friend  Protective factors (what has kept the patient from self-harm thus far):  Children  Substance use / abuse:  Currently uses cigarettes, uses alcohol a couple of times a month but just a glass of wine  Presence of hallucinations / delusions: Patient reports hearing voices that aren't her own. These voices have been present for many years - patient explains that they say different things depending on the situation.   History of SI / Attempts: Patient states she has struggled with SI for years. She listed three past attempts: in 2013/2014, in 2006, and in 1998. All three times patient attempted to overdose on her medications. She couldn't remember the details of 1998 well, but stated that she went to the ED in 2006 but was not admitted, and came to [facility] in 2013/2014 because her mom brought her in after she overdosed and she was then brought to [facility].   Of note, patient reports she went to [facility] ED in February of this year because she was worried about her suicidal thoughts. She was not admitted.   Family history of attempted or completed suicide: Brother - family private so wouldn't have known  Duration and Intensity of SI:  Patient   History of prior psychiatric hospitalizations: Patient states  went to behavioral health hospital in 2013 or 2014  Coping mechanisms: Smoke cigarettes, church/prayer, cooking, watch youtube, DIY home decor projects, spend time with children  How likely are you to act on these thoughts of hurting yourself or ending your life sometime over the next month? NOT LIKELY AT ALL  If somewhat or very likely, consider hospitalization.   Consult suicide protocol if you have not yet done so.  Assessment / Plan / Recommendations: Patient is in need of a therapist. Due to her social anxiety and her mental health needs, she would likely benefit most from individual therapy. I offered to discuss referral options today or to discuss next week. Patient elected to come back for an appointment next week to sit down and make a plan about finding a therapist. I'll see her next Tuesday at 11am. We discussed her safety plan (use Virtual Hope Box app, distraction techniques, contact social support person, or go to ED/call 911 if necessary). Patient denies any plans to harm herself and states she is safe.   Suicide risk assessment was conducted (see above). [facility] consulted with Dr. Pascal Lux and it was determined that patient was not in imminent danger of harming herself according to her report at this appointment. Patient engaged in conversation about safety planning and patient agreed that if in the future she is in danger of hurting herself, she will go to the hospital.

## 2016-07-20 ENCOUNTER — Ambulatory Visit: Payer: Self-pay

## 2016-07-27 ENCOUNTER — Telehealth: Payer: Self-pay

## 2016-07-27 NOTE — Telephone Encounter (Signed)
[facility]. Left message [facility] patient [facility]

## 2016-09-02 ENCOUNTER — Other Ambulatory Visit: Payer: Self-pay | Admitting: Family Medicine

## 2016-09-23 ENCOUNTER — Ambulatory Visit (INDEPENDENT_AMBULATORY_CARE_PROVIDER_SITE_OTHER): Payer: Medicaid Other | Admitting: Family Medicine

## 2016-09-23 ENCOUNTER — Encounter: Payer: Self-pay | Admitting: Family Medicine

## 2016-09-23 VITALS — BP 118/82 | HR 70 | Temp 98.9°F | Ht 66.0 in | Wt 176.8 lb

## 2016-09-23 DIAGNOSIS — R05 Cough: Secondary | ICD-10-CM | POA: Diagnosis not present

## 2016-09-23 DIAGNOSIS — R059 Cough, unspecified: Secondary | ICD-10-CM

## 2016-09-23 MED ORDER — BENZONATATE 200 MG PO CAPS: 200.0000 mg | ORAL_CAPSULE | Freq: Three times a day (TID) | ORAL | 0 refills | Status: DC | PRN

## 2016-09-23 NOTE — Patient Instructions (Signed)
Cough, Adult A cough helps to clear your throat and lungs. A cough may last only 2-3 weeks (acute), or it may last longer than 8 weeks (chronic). Many different things can cause a cough. A cough may be a sign of an illness or another medical condition. Follow these instructions at home:  Pay attention to any changes in your cough.  Take medicines only as told by your doctor. ? If you were prescribed an antibiotic medicine, take it as told by your doctor. Do not stop taking it even if you start to feel better. ? Talk with your doctor before you try using a cough medicine.  Drink enough fluid to keep your pee (urine) clear or pale yellow.  If the air is dry, use a cold steam vaporizer or humidifier in your home.  Stay away from things that make you cough at work or at home.  If your cough is worse at night, try using extra pillows to raise your head up higher while you sleep.  Do not smoke, and try not to be around smoke. If you need help quitting, ask your doctor.  Do not have caffeine.  Do not drink alcohol.  Rest as needed. Contact a doctor if:  You have new problems (symptoms).  You cough up yellow fluid (pus).  Your cough does not get better after 2-3 weeks, or your cough gets worse.  Medicine does not help your cough and you are not sleeping well.  You have pain that gets worse or pain that is not helped with medicine.  You have a fever.  You are losing weight and you do not know why.  You have night sweats. Get help right away if:  You cough up blood.  You have trouble breathing.  Your heartbeat is very fast. This information is not intended to replace advice given to you by your health care provider. Make sure you discuss any questions you have with your health care provider. Document Released: 11/12/2010 Document Revised: 08/07/2015 Document Reviewed: 05/08/2014 Elsevier Interactive Patient Education  [employer]2018 [employer]   It was a pleasure seeing you  today. Please f/u with PCP and sooner if symptoms persist or worsen. Please take Tessalon pearls as needed up to 3 times a day. Use honey to help soothe throat and decrease cough.  Shirley ManisSherin Milicent Acheampong, DO

## 2016-09-23 NOTE — Progress Notes (Signed)
Subjective:    Patient ID: [name], female    DOB: 01/16/1981, 36 y.o.   MRN: [id number]   CC: Cough and congestion   HPI: [name] is a 36 y.o. Female presenting with sore throat, cough, and congestion x 2-3 days in duration. Patient attests to yellow/clear sputum that has been making her throw up due to post nasal drip. Patient states that when coughing spells get worse enough her chest begins to hurt from coughing, with cough worse in the morning when getting out of bed.  Patient denies wheezing, but has a history of asthma. Patient also notes that she has bad breath because of sputum. Patient also notes fever of 101.3 yesterday, but has been afebrile since. Patient denies sick contacts. Patient has seasonal allergies but takes zyrtec. Gets hot and cold, but states she is also going through menopause at the moment. During these spells patient gets hotflashes and gets dizzy. Denies nausea or diarrhea. Patient is drinking plenty of fluids and notices a decreased appetite. Going through menopause right now. Has tried dayquil and nyquil with no relief. Patient is drinking "throat-coat" and lemon-honey-ginger water with no relief. Patient reports weakness and fatigue.   Smoking status reviewed. Current smoker. 5 cigarrettes a day since 36 years old.   Review of Systems-as stated in HPI with following additions Patient positive for shortness of breath and chills Patient denies wheezing and chest pain     Objective:  BP 118/82   Pulse 70   Temp 98.9 F (37.2 C) (Oral)   Ht 5\' 6"  (1.676 m)   Wt 176 lb 12.8 oz (80.2 kg)   SpO2 99%   BMI 28.54 kg/m  Vitals and nursing note reviewed  General: well nourished, in no acute distress HEENT: normocephalic, no scleral icterus or conjunctival pallor, no nasal discharge, moist mucous membranes, slight congestion noted, tympanic membranes normal, no erythema of throat, no tonsillar exudates   Neck: supple, non-tender, without  lymphadenopathy Cardiac: RRR, clear S1 and S2, no murmurs, rubs, or gallops Respiratory: clear to auscultation bilaterally, no increased work of breathing Abdomen: soft, nontender, nondistended, no masses or organomegaly. Bowel sounds present Extremities: no edema or cyanosis. Warm, well perfused. 2+ radial  Skin: warm and dry, no rashes noted Neuro: alert and oriented   Assessment & Plan:   Cough  Patient presented with cough for 2-3 days in duration. Patient attests to yellow/clear sputum that has been making her throw up due to post nasal drip. Patient states that when coughing spells get worse enough her chest begins to hurt from coughing, with cough worse in the morning when getting out of bed.  Patient denies wheezing, but has a history of asthma. Due to only one reading of fever and remaining afebrile since then, less likely bacterial in origin.  -Tessalon pearls 200 mg tid prn for cough -Advised to look for signs of asthma exacerbation and to make an appointment if suspecting asthma  -Advised to drink one spoon of honey either plain or in tea -Advised to drink plenty of fluids -Advised to use saline nasal spray as needed  -Advised to f/u if suspecting of asthma -Advised to f/u with PCP or sooner if symptoms persist or worsen  Sore Throat Patient has been having sore throat for 2-3 days duration. Increased post nasal drainage also noted. Patient did not have tonsillar exudates or erythema on exam, less likely strep throat due to congestion, age, and lack of sick contacts.  -Take tylenol prn  for fever and pain  -Advised to drink one spoon of honey either plain or in tea   Patient has understood and agreed to plan. Advised to f/u with PCP or sooner if symptoms worsen or persist  Shirley ManisSherin Decie Verne, DO, PGY-1

## 2016-11-03 ENCOUNTER — Encounter: Payer: Self-pay | Admitting: Family Medicine

## 2016-11-03 ENCOUNTER — Other Ambulatory Visit: Payer: Self-pay | Admitting: Family Medicine

## 2016-11-03 ENCOUNTER — Telehealth: Payer: Self-pay | Admitting: Family Medicine

## 2016-11-03 MED ORDER — VARENICLINE TARTRATE 1 MG PO TABS: 1.0000 mg | ORAL_TABLET | Freq: Two times a day (BID) | ORAL | 1 refills | Status: DC

## 2016-11-03 MED ORDER — VARENICLINE TARTRATE 0.5 MG X 11 & 1 MG X 42 PO MISC: ORAL | 0 refills | Status: DC

## 2016-11-03 NOTE — Telephone Encounter (Signed)
I spoke with patient about hot flashes. Occurring 6 to 8 times a day and similar frequency at night. Daytime hot flashes make usual acitivitiy somewhat difficult, for example not being able to attend her church where she was suppose to sing.  Nighttime hot flashes are interrupting her sleep multiple with feeling sleepy during the day.   Variable FSH in past. Has intact ovaries and uterus.  Bilateral fallopian tube removal for failed BTL.   Ms Mauzey still smokes cigarettes.  She is taking multiple psychiatric medications from [facility].  A/ 1. Possibly Vasomotor symptoms of perimenopause (premature ovarian failure) 2. Tobacco Dependence 3. Polypharmacy  Plan 1. Rx Chantrix starter pack and two monthly continuing packs to attempt smoking cessation 2. Restart her Venlafaxine to help with VMS 3. Continue Gabapentin to see if helps with VMS 4. Schedule appointment soon to discuss hot flashes - Bring all her medications with her.

## 2016-11-03 NOTE — Telephone Encounter (Signed)
Patient scheduled for 11-25-16. [name],CMA

## 2016-11-03 NOTE — Telephone Encounter (Signed)
Patient requesting to speak to PCP about menopause and possibly being put on an estrogen replacement. Please advise.

## 2016-11-03 NOTE — Telephone Encounter (Signed)
Please schedule an appointment for [name] with me with first available in my continuity clinic.  Do not double book. Thank you.

## 2016-11-11 ENCOUNTER — Other Ambulatory Visit: Payer: Self-pay | Admitting: Family Medicine

## 2016-11-11 DIAGNOSIS — F411 Generalized anxiety disorder: Secondary | ICD-10-CM

## 2016-11-11 DIAGNOSIS — N951 Menopausal and female climacteric states: Secondary | ICD-10-CM

## 2016-11-25 ENCOUNTER — Telehealth: Payer: Self-pay | Admitting: Family Medicine

## 2016-11-25 ENCOUNTER — Ambulatory Visit: Payer: Medicaid Other | Admitting: Family Medicine

## 2016-11-25 DIAGNOSIS — M47816 Spondylosis without myelopathy or radiculopathy, lumbar region: Secondary | ICD-10-CM | POA: Diagnosis not present

## 2016-11-25 NOTE — Telephone Encounter (Signed)
Patient could not stay for appt. Today due to surgery appt. At 11:00. Patient would like a call to discuss the hot flashes issue. [phone]

## 2016-11-26 ENCOUNTER — Ambulatory Visit: Payer: Self-pay

## 2016-11-30 ENCOUNTER — Ambulatory Visit: Payer: Self-pay

## 2016-11-30 MED ORDER — ESTRADIOL-LEVONORGESTREL 0.045-0.015 MG/DAY TD PTWK: 1.0000 | MEDICATED_PATCH | TRANSDERMAL | 2 refills | Status: DC

## 2016-11-30 NOTE — Telephone Encounter (Signed)
Ms Polich continues to have frequent hot flashes and sweats that interfere with her daily activities and sleep.  She is smoking 3 to 5 cigarettes.  She understands that smoking while taking HRT increases her risk of MI and stroke but wishes to proceed with trial of Climara Pro patch. Rx Climara Pro patch apply once a week for 3 month trial.  RTC 3 months to assess response of perimenopausal vasomotor symptoms to HRT.  Sent in 4 weeks of patch with two refills.

## 2016-12-03 ENCOUNTER — Other Ambulatory Visit: Payer: Self-pay | Admitting: Family Medicine

## 2017-01-04 ENCOUNTER — Other Ambulatory Visit: Payer: Self-pay | Admitting: Family Medicine

## 2017-02-15 DIAGNOSIS — M47816 Spondylosis without myelopathy or radiculopathy, lumbar region: Secondary | ICD-10-CM | POA: Diagnosis not present

## 2017-02-15 DIAGNOSIS — I10 Essential (primary) hypertension: Secondary | ICD-10-CM | POA: Diagnosis not present

## 2017-02-15 DIAGNOSIS — Z6830 Body mass index (BMI) 30.0-30.9, adult: Secondary | ICD-10-CM | POA: Diagnosis not present

## 2017-02-15 DIAGNOSIS — M5126 Other intervertebral disc displacement, lumbar region: Secondary | ICD-10-CM | POA: Diagnosis not present

## 2017-02-15 DIAGNOSIS — M545 Low back pain: Secondary | ICD-10-CM | POA: Diagnosis not present

## 2017-02-23 ENCOUNTER — Other Ambulatory Visit: Payer: Self-pay | Admitting: Family Medicine

## 2017-02-23 DIAGNOSIS — Z79891 Long term (current) use of opiate analgesic: Secondary | ICD-10-CM | POA: Insufficient documentation

## 2017-02-23 DIAGNOSIS — G894 Chronic pain syndrome: Secondary | ICD-10-CM

## 2017-02-23 HISTORY — DX: Long term (current) use of opiate analgesic: Z79.891

## 2017-03-11 DIAGNOSIS — M5137 Other intervertebral disc degeneration, lumbosacral region: Secondary | ICD-10-CM | POA: Diagnosis not present

## 2017-03-11 DIAGNOSIS — S39012A Strain of muscle, fascia and tendon of lower back, initial encounter: Secondary | ICD-10-CM | POA: Diagnosis not present

## 2017-03-11 DIAGNOSIS — M9903 Segmental and somatic dysfunction of lumbar region: Secondary | ICD-10-CM | POA: Diagnosis not present

## 2017-03-11 DIAGNOSIS — M9902 Segmental and somatic dysfunction of thoracic region: Secondary | ICD-10-CM | POA: Diagnosis not present

## 2017-03-11 DIAGNOSIS — M5414 Radiculopathy, thoracic region: Secondary | ICD-10-CM | POA: Diagnosis not present

## 2017-03-11 DIAGNOSIS — M9905 Segmental and somatic dysfunction of pelvic region: Secondary | ICD-10-CM | POA: Diagnosis not present

## 2017-04-07 ENCOUNTER — Encounter (HOSPITAL_COMMUNITY): Payer: Self-pay | Admitting: *Deleted

## 2017-04-07 ENCOUNTER — Emergency Department (HOSPITAL_COMMUNITY): Payer: Medicare Other

## 2017-04-07 ENCOUNTER — Other Ambulatory Visit: Payer: Self-pay

## 2017-04-07 ENCOUNTER — Emergency Department (HOSPITAL_COMMUNITY)
Admission: EM | Admit: 2017-04-07 | Discharge: 2017-04-07 | Disposition: A | Discharge status: Home / Self Care | Payer: Medicare Other | Attending: Emergency Medicine | Admitting: Emergency Medicine

## 2017-04-07 DIAGNOSIS — Z79899 Other long term (current) drug therapy: Secondary | ICD-10-CM | POA: Diagnosis not present

## 2017-04-07 DIAGNOSIS — B349 Viral infection, unspecified: Secondary | ICD-10-CM | POA: Diagnosis not present

## 2017-04-07 DIAGNOSIS — J45909 Unspecified asthma, uncomplicated: Secondary | ICD-10-CM | POA: Insufficient documentation

## 2017-04-07 DIAGNOSIS — I10 Essential (primary) hypertension: Secondary | ICD-10-CM | POA: Diagnosis not present

## 2017-04-07 DIAGNOSIS — R079 Chest pain, unspecified: Secondary | ICD-10-CM | POA: Diagnosis not present

## 2017-04-07 DIAGNOSIS — R6883 Chills (without fever): Secondary | ICD-10-CM | POA: Diagnosis not present

## 2017-04-07 DIAGNOSIS — R05 Cough: Secondary | ICD-10-CM | POA: Diagnosis not present

## 2017-04-07 DIAGNOSIS — F17210 Nicotine dependence, cigarettes, uncomplicated: Secondary | ICD-10-CM | POA: Diagnosis not present

## 2017-04-07 DIAGNOSIS — R0602 Shortness of breath: Secondary | ICD-10-CM | POA: Diagnosis not present

## 2017-04-07 LAB — CBC WITH DIFFERENTIAL/PLATELET
Basophils Absolute: 0 10*3/uL (ref 0.0–0.1)
Basophils Relative: 0 %
Eosinophils Absolute: 0 10*3/uL (ref 0.0–0.7)
Eosinophils Relative: 1 %
HEMATOCRIT: 43 % (ref 36.0–46.0)
HEMOGLOBIN: 14.6 g/dL (ref 12.0–15.0)
LYMPHS ABS: 3.2 10*3/uL (ref 0.7–4.0)
LYMPHS PCT: 38 %
MCH: 30.2 pg (ref 26.0–34.0)
MCHC: 34 g/dL (ref 30.0–36.0)
MCV: 88.8 fL (ref 78.0–100.0)
Monocytes Absolute: 0.4 10*3/uL (ref 0.1–1.0)
Monocytes Relative: 4 %
NEUTROS PCT: 57 %
Neutro Abs: 4.9 10*3/uL (ref 1.7–7.7)
Platelets: 276 10*3/uL (ref 150–400)
RBC: 4.84 MIL/uL (ref 3.87–5.11)
RDW: 13.5 % (ref 11.5–15.5)
WBC: 8.6 10*3/uL (ref 4.0–10.5)

## 2017-04-07 LAB — URINALYSIS, ROUTINE W REFLEX MICROSCOPIC
Bilirubin Urine: NEGATIVE
Glucose, UA: NEGATIVE mg/dL
Hgb urine dipstick: NEGATIVE
Ketones, ur: NEGATIVE mg/dL
Leukocytes, UA: NEGATIVE
NITRITE: NEGATIVE
PH: 6 (ref 5.0–8.0)
Protein, ur: NEGATIVE mg/dL
SPECIFIC GRAVITY, URINE: 1.021 (ref 1.005–1.030)

## 2017-04-07 LAB — COMPREHENSIVE METABOLIC PANEL
ALK PHOS: 70 U/L (ref 38–126)
ALT: 17 U/L (ref 14–54)
AST: 20 U/L (ref 15–41)
Albumin: 4 g/dL (ref 3.5–5.0)
Anion gap: 12 (ref 5–15)
BUN: <5 mg/dL — ABNORMAL LOW (ref 6–20)
CALCIUM: 9.4 mg/dL (ref 8.9–10.3)
CO2: 23 mmol/L (ref 22–32)
CREATININE: 0.81 mg/dL (ref 0.44–1.00)
Chloride: 105 mmol/L (ref 101–111)
Glucose, Bld: 91 mg/dL (ref 65–99)
Potassium: 3.4 mmol/L — ABNORMAL LOW (ref 3.5–5.1)
Sodium: 140 mmol/L (ref 135–145)
Total Bilirubin: 0.6 mg/dL (ref 0.3–1.2)
Total Protein: 6.5 g/dL (ref 6.5–8.1)

## 2017-04-07 LAB — I-STAT BETA HCG BLOOD, ED (MC, WL, AP ONLY): I-stat hCG, quantitative: <5 m[IU]/mL (ref <5)

## 2017-04-07 LAB — INFLUENZA PANEL BY PCR (TYPE A & B)
Influenza A By PCR: NEGATIVE
Influenza B By PCR: NEGATIVE

## 2017-04-07 MED ORDER — ACETAMINOPHEN 325 MG PO TABS
650.0000 mg | ORAL_TABLET | Freq: Once | ORAL | Status: AC
  Administered 2017-04-07: 650 mg via ORAL
  Filled 2017-04-07: qty 2

## 2017-04-07 MED ORDER — SODIUM CHLORIDE 0.9 % IV BOLUS (SEPSIS)
1000.0000 mL | Freq: Once | INTRAVENOUS | Status: AC
Start: 2017-04-07 — End: 2017-04-07
  Administered 2017-04-07: 1000 mL via INTRAVENOUS

## 2017-04-07 NOTE — ED Triage Notes (Signed)
Pt c/o generalized body aches and sore throat for the past 4 days. Reports her daughter was diagnosed with the flu and feels that she may have it as well

## 2017-04-07 NOTE — Discharge Instructions (Signed)
Take tylenol, motrin for fever or chills.   Stay hydrated.   See your doctor.   Return to ER if you have vomiting, dehydration, severe abdominal pain, fever

## 2017-04-07 NOTE — ED Notes (Signed)
Awaiting flu swab result, patient updated
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
## 2017-04-24 ENCOUNTER — Other Ambulatory Visit: Payer: Self-pay

## 2017-04-24 ENCOUNTER — Encounter (HOSPITAL_COMMUNITY): Payer: Self-pay | Admitting: *Deleted

## 2017-04-24 ENCOUNTER — Ambulatory Visit (HOSPITAL_COMMUNITY)
Admission: EM | Admit: 2017-04-24 | Discharge: 2017-04-24 | Disposition: A | Discharge status: Home / Self Care | Payer: Medicaid Other | Attending: Physician Assistant | Admitting: Physician Assistant

## 2017-04-24 DIAGNOSIS — R220 Localized swelling, mass and lump, head: Secondary | ICD-10-CM | POA: Diagnosis not present

## 2017-04-24 MED ORDER — AMOXICILLIN-POT CLAVULANATE 875-125 MG PO TABS: 1.0000 | ORAL_TABLET | Freq: Two times a day (BID) | ORAL | 0 refills | Status: DC

## 2017-04-24 NOTE — ED Triage Notes (Addendum)
Per pt she fell on her face and lips is hurting really bad, per pt her knees is affected.
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
## 2017-04-24 NOTE — Discharge Instructions (Addendum)
Take 600-800 mg of ibuprofen every 8 hours for pain in the lip.  Start the Augmentin tonight.  If your lip is getting worse while taking medication it would be wise to go to the emergency department as you could have an abscess forming in that area and this may require incision and drainage.

## 2017-04-27 DIAGNOSIS — H538 Other visual disturbances: Secondary | ICD-10-CM | POA: Diagnosis not present

## 2017-04-27 DIAGNOSIS — H53039 Strabismic amblyopia, unspecified eye: Secondary | ICD-10-CM | POA: Diagnosis not present

## 2017-04-27 DIAGNOSIS — H1013 Acute atopic conjunctivitis, bilateral: Secondary | ICD-10-CM | POA: Diagnosis not present

## 2017-05-11 DIAGNOSIS — M545 Low back pain: Secondary | ICD-10-CM | POA: Diagnosis not present

## 2017-05-11 DIAGNOSIS — M47816 Spondylosis without myelopathy or radiculopathy, lumbar region: Secondary | ICD-10-CM | POA: Diagnosis not present

## 2017-05-16 DIAGNOSIS — S39012A Strain of muscle, fascia and tendon of lower back, initial encounter: Secondary | ICD-10-CM | POA: Diagnosis not present

## 2017-05-16 DIAGNOSIS — M5414 Radiculopathy, thoracic region: Secondary | ICD-10-CM | POA: Diagnosis not present

## 2017-05-16 DIAGNOSIS — M9905 Segmental and somatic dysfunction of pelvic region: Secondary | ICD-10-CM | POA: Diagnosis not present

## 2017-05-16 DIAGNOSIS — M9902 Segmental and somatic dysfunction of thoracic region: Secondary | ICD-10-CM | POA: Diagnosis not present

## 2017-05-16 DIAGNOSIS — M9903 Segmental and somatic dysfunction of lumbar region: Secondary | ICD-10-CM | POA: Diagnosis not present

## 2017-05-16 DIAGNOSIS — M5137 Other intervertebral disc degeneration, lumbosacral region: Secondary | ICD-10-CM | POA: Diagnosis not present

## 2017-05-24 DIAGNOSIS — L658 Other specified nonscarring hair loss: Secondary | ICD-10-CM | POA: Diagnosis not present

## 2017-05-24 DIAGNOSIS — L910 Hypertrophic scar: Secondary | ICD-10-CM | POA: Diagnosis not present

## 2017-06-06 DIAGNOSIS — M47816 Spondylosis without myelopathy or radiculopathy, lumbar region: Secondary | ICD-10-CM | POA: Diagnosis not present

## 2017-06-07 ENCOUNTER — Other Ambulatory Visit: Payer: Self-pay

## 2017-06-07 ENCOUNTER — Other Ambulatory Visit (HOSPITAL_COMMUNITY)
Admission: RE | Admit: 2017-06-07 | Discharge: 2017-06-07 | Disposition: A | Discharge status: Home / Self Care | Payer: Medicare Other | Source: Ambulatory Visit | Attending: Family Medicine | Admitting: Family Medicine

## 2017-06-07 ENCOUNTER — Ambulatory Visit (INDEPENDENT_AMBULATORY_CARE_PROVIDER_SITE_OTHER): Payer: Medicare Other | Admitting: Internal Medicine

## 2017-06-07 ENCOUNTER — Encounter: Payer: Self-pay | Admitting: Internal Medicine

## 2017-06-07 VITALS — BP 128/80 | HR 88 | Ht 66.0 in | Wt 159.0 lb

## 2017-06-07 DIAGNOSIS — B9689 Other specified bacterial agents as the cause of diseases classified elsewhere: Secondary | ICD-10-CM

## 2017-06-07 DIAGNOSIS — Z202 Contact with and (suspected) exposure to infections with a predominantly sexual mode of transmission: Secondary | ICD-10-CM

## 2017-06-07 DIAGNOSIS — Z113 Encounter for screening for infections with a predominantly sexual mode of transmission: Secondary | ICD-10-CM

## 2017-06-07 DIAGNOSIS — Z114 Encounter for screening for human immunodeficiency virus [HIV]: Secondary | ICD-10-CM

## 2017-06-07 DIAGNOSIS — N760 Acute vaginitis: Secondary | ICD-10-CM

## 2017-06-07 LAB — POCT WET PREP (WET MOUNT)
Clue Cells Wet Prep Whiff POC: POSITIVE
Trichomonas Wet Prep HPF POC: ABSENT

## 2017-06-07 MED ORDER — METRONIDAZOLE 500 MG PO TABS: 500.0000 mg | ORAL_TABLET | Freq: Two times a day (BID) | ORAL | 0 refills | Status: DC

## 2017-06-07 NOTE — Progress Notes (Signed)
   [facility] Phone: [phone]   Date of Visit: 06/07/2017   HPI:  STI Screening:  - patient reports of vaginal odor for about 1 week - no vaginal discharge or vaginal bleeding - her LMP was June 2018. She reports that she is going through menopause. She has been evaluated for this.  - she is sexually active with her husband who she is currently separated from. She is having unprotected intercourse and knows that he has other partners.  - no dysuria or urinary frequency  - up to date on pap smear - tubal ligation in 2009  ROS: See HPI.  PMFSH:  PMH: HTN Asthma GERD Eczema DDD Tobacco Use Mixed Bipolar DO   PHYSICAL EXAM: BP 128/80   Pulse 88   Ht 5\' 6"  (1.676 m)   Wt 159 lb (72.1 kg)   SpO2 98%   BMI 25.66 kg/m  GEN: NAD  CV: RRR, no murmurs, rubs, or gallops PULM: CTAB, normal effort ABD: Soft, nontender, nondistended, NABS, no organomegaly Female genitalia: normal external genitalia, vulva, vagina, cervix, uterus and adnexa. Mild white discharge. No cervical motion tenderness SKIN: No rash or cyanosis; warm and well-perfused PSYCH: Mood and affect euthymic, normal rate and volume of speech NEURO: Awake, alert, no focal deficits grossly, normal speech   ASSESSMENT/PLAN:  STD Check:  - HIV, RPR - Gc/Chlamydia  Bacterial Vaginosis:  - Flagyl 500mg  BID x 7 days. Discussed avoiding alcohol while on medication and for a few days after.   - declined Flu vaccine  Palma HolterKanishka G Ole Lafon, MD PGY 3 [facility]

## 2017-06-07 NOTE — Patient Instructions (Signed)
Thank you for coming in!    I will call you with the results.

## 2017-06-08 LAB — RPR: RPR Ser Ql: NONREACTIVE

## 2017-06-08 LAB — CERVICOVAGINAL ANCILLARY ONLY
CHLAMYDIA, DNA PROBE: NEGATIVE
Neisseria Gonorrhea: NEGATIVE

## 2017-06-08 LAB — HIV ANTIBODY (ROUTINE TESTING W REFLEX): HIV Screen 4th Generation wRfx: NONREACTIVE

## 2017-06-09 ENCOUNTER — Telehealth: Payer: Self-pay | Admitting: Internal Medicine

## 2017-06-09 NOTE — Telephone Encounter (Signed)
Called to report negative STD testing

## 2017-06-13 DIAGNOSIS — M47816 Spondylosis without myelopathy or radiculopathy, lumbar region: Secondary | ICD-10-CM | POA: Diagnosis not present

## 2017-06-17 ENCOUNTER — Telehealth: Payer: Self-pay

## 2017-06-17 MED ORDER — METRONIDAZOLE 500 MG PO TABS: 500.0000 mg | ORAL_TABLET | Freq: Two times a day (BID) | ORAL | 0 refills | Status: DC

## 2017-06-17 NOTE — Telephone Encounter (Signed)
Pt called saying she never got rx for metronidazole that her pharmacy does not have it. [facility] states she doesn't use [facility] and "hasnt for 10 years". Called [facility] to verify rx was not picked up, and pt did not pick it up. Will resend to [facility] per Pt request. Shawna OrleansMeredith B Keenen Roessner, RN

## 2017-06-27 ENCOUNTER — Other Ambulatory Visit: Payer: Self-pay

## 2017-06-27 ENCOUNTER — Emergency Department (HOSPITAL_COMMUNITY)
Admission: EM | Admit: 2017-06-27 | Discharge: 2017-06-27 | Disposition: A | Discharge status: Home / Self Care | Payer: Medicare Other | Attending: Physician Assistant | Admitting: Physician Assistant

## 2017-06-27 ENCOUNTER — Emergency Department (HOSPITAL_COMMUNITY): Payer: Medicare Other

## 2017-06-27 ENCOUNTER — Encounter (HOSPITAL_COMMUNITY): Payer: Self-pay | Admitting: Emergency Medicine

## 2017-06-27 DIAGNOSIS — R0789 Other chest pain: Secondary | ICD-10-CM | POA: Diagnosis not present

## 2017-06-27 DIAGNOSIS — Z79899 Other long term (current) drug therapy: Secondary | ICD-10-CM | POA: Diagnosis not present

## 2017-06-27 DIAGNOSIS — J069 Acute upper respiratory infection, unspecified: Secondary | ICD-10-CM | POA: Insufficient documentation

## 2017-06-27 DIAGNOSIS — F17210 Nicotine dependence, cigarettes, uncomplicated: Secondary | ICD-10-CM | POA: Diagnosis not present

## 2017-06-27 DIAGNOSIS — I10 Essential (primary) hypertension: Secondary | ICD-10-CM | POA: Diagnosis not present

## 2017-06-27 DIAGNOSIS — R509 Fever, unspecified: Secondary | ICD-10-CM | POA: Diagnosis not present

## 2017-06-27 DIAGNOSIS — R05 Cough: Secondary | ICD-10-CM | POA: Diagnosis not present

## 2017-06-27 DIAGNOSIS — J029 Acute pharyngitis, unspecified: Secondary | ICD-10-CM | POA: Diagnosis not present

## 2017-06-27 MED ORDER — ALBUTEROL SULFATE HFA 108 (90 BASE) MCG/ACT IN AERS
2.0000 | INHALATION_SPRAY | Freq: Once | RESPIRATORY_TRACT | Status: AC
  Administered 2017-06-27: 2 via RESPIRATORY_TRACT
  Filled 2017-06-27: qty 6.7

## 2017-06-27 MED ORDER — PREDNISONE 20 MG PO TABS: 40.0000 mg | ORAL_TABLET | Freq: Every day | ORAL | 0 refills | Status: DC

## 2017-06-27 MED ORDER — PREDNISONE 20 MG PO TABS
60.0000 mg | ORAL_TABLET | Freq: Once | ORAL | Status: AC
  Administered 2017-06-27: 60 mg via ORAL
  Filled 2017-06-27: qty 3

## 2017-06-27 MED ORDER — IPRATROPIUM-ALBUTEROL 0.5-2.5 (3) MG/3ML IN SOLN
3.0000 mL | Freq: Once | RESPIRATORY_TRACT | Status: AC
  Administered 2017-06-27: 3 mL via RESPIRATORY_TRACT
  Filled 2017-06-27: qty 3

## 2017-06-27 NOTE — ED Notes (Signed)
Patient transported to X-ray 

## 2017-06-27 NOTE — ED Triage Notes (Signed)
Pt states on Tuesday or wed she began to have productive cough with nasal congestion and sore throat.
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
## 2017-06-27 NOTE — Discharge Instructions (Addendum)
Please read instructions below.  You can take tylenol as needed for sore throat or fever.  Drink plenty of water.  Use saline nasal spray for congestion. Use your albuterol inhaler every 4-6 hours as needed for wheezing or chest tightness. Follow up with your primary care provider as needed.  Return to the ER for inability to swallow liquids, difficulty breathing, or new or worsening symptoms.

## 2017-07-09 ENCOUNTER — Other Ambulatory Visit: Payer: Self-pay | Admitting: Family Medicine

## 2017-08-12 ENCOUNTER — Ambulatory Visit: Payer: Medicare Other | Admitting: Family Medicine

## 2017-08-16 ENCOUNTER — Other Ambulatory Visit: Payer: Self-pay | Admitting: Family Medicine

## 2017-08-23 DIAGNOSIS — L658 Other specified nonscarring hair loss: Secondary | ICD-10-CM | POA: Diagnosis not present

## 2017-08-23 DIAGNOSIS — L910 Hypertrophic scar: Secondary | ICD-10-CM | POA: Diagnosis not present

## 2017-08-23 DIAGNOSIS — L700 Acne vulgaris: Secondary | ICD-10-CM | POA: Diagnosis not present

## 2017-09-17 ENCOUNTER — Other Ambulatory Visit: Payer: Self-pay | Admitting: Family Medicine

## 2017-10-06 DIAGNOSIS — M9902 Segmental and somatic dysfunction of thoracic region: Secondary | ICD-10-CM | POA: Diagnosis not present

## 2017-10-06 DIAGNOSIS — M9903 Segmental and somatic dysfunction of lumbar region: Secondary | ICD-10-CM | POA: Diagnosis not present

## 2017-10-06 DIAGNOSIS — S134XXA Sprain of ligaments of cervical spine, initial encounter: Secondary | ICD-10-CM | POA: Diagnosis not present

## 2017-10-06 DIAGNOSIS — M9901 Segmental and somatic dysfunction of cervical region: Secondary | ICD-10-CM | POA: Diagnosis not present

## 2017-10-06 DIAGNOSIS — M5414 Radiculopathy, thoracic region: Secondary | ICD-10-CM | POA: Diagnosis not present

## 2017-10-06 DIAGNOSIS — M5137 Other intervertebral disc degeneration, lumbosacral region: Secondary | ICD-10-CM | POA: Diagnosis not present

## 2017-10-26 DIAGNOSIS — M47816 Spondylosis without myelopathy or radiculopathy, lumbar region: Secondary | ICD-10-CM | POA: Diagnosis not present

## 2017-10-26 DIAGNOSIS — Z79891 Long term (current) use of opiate analgesic: Secondary | ICD-10-CM | POA: Diagnosis not present

## 2017-10-26 DIAGNOSIS — Z5181 Encounter for therapeutic drug level monitoring: Secondary | ICD-10-CM | POA: Diagnosis not present

## 2017-10-26 DIAGNOSIS — Z79899 Other long term (current) drug therapy: Secondary | ICD-10-CM | POA: Diagnosis not present

## 2017-10-26 DIAGNOSIS — I10 Essential (primary) hypertension: Secondary | ICD-10-CM | POA: Diagnosis not present

## 2017-10-26 DIAGNOSIS — M545 Low back pain: Secondary | ICD-10-CM | POA: Diagnosis not present

## 2017-11-22 ENCOUNTER — Encounter: Payer: Self-pay | Admitting: Family Medicine

## 2017-11-22 NOTE — Progress Notes (Unsigned)
OV note Dr [name] at [facility] 11/07/08  Prior left L4 through S1 radiofreq ablation 06/13/17 what helpful but wore off after three months. Taking hydrocodone, robaxin and meloxicam and gabapentin which provides only minimal relief.   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    P/  Medial Branch Block - bilateral L4-S1.          Low back pain, unspecified laterality with sciatica presence unspecified (M54.5)

## 2017-12-08 ENCOUNTER — Ambulatory Visit (INDEPENDENT_AMBULATORY_CARE_PROVIDER_SITE_OTHER): Payer: Medicare Other | Admitting: *Deleted

## 2017-12-08 ENCOUNTER — Other Ambulatory Visit: Payer: Self-pay | Admitting: Family Medicine

## 2017-12-08 DIAGNOSIS — Z23 Encounter for immunization: Secondary | ICD-10-CM

## 2017-12-08 DIAGNOSIS — L308 Other specified dermatitis: Secondary | ICD-10-CM

## 2017-12-08 MED ORDER — BETAMETHASONE VALERATE 0.1 % EX OINT: 1.0000 "application " | TOPICAL_OINTMENT | Freq: Two times a day (BID) | CUTANEOUS | 3 refills | Status: DC

## 2017-12-08 NOTE — Progress Notes (Unsigned)
Clobetasol

## 2017-12-29 ENCOUNTER — Other Ambulatory Visit: Payer: Self-pay | Admitting: Family Medicine

## 2018-01-26 ENCOUNTER — Other Ambulatory Visit: Payer: Self-pay

## 2018-01-26 ENCOUNTER — Encounter: Payer: Self-pay | Admitting: Family Medicine

## 2018-01-26 ENCOUNTER — Ambulatory Visit (INDEPENDENT_AMBULATORY_CARE_PROVIDER_SITE_OTHER): Payer: Medicare Other | Admitting: Family Medicine

## 2018-01-26 ENCOUNTER — Other Ambulatory Visit (HOSPITAL_COMMUNITY)
Admission: RE | Admit: 2018-01-26 | Discharge: 2018-01-26 | Disposition: A | Discharge status: Home / Self Care | Payer: Medicare Other | Source: Ambulatory Visit | Attending: Family Medicine | Admitting: Family Medicine

## 2018-01-26 VITALS — BP 118/74 | HR 74 | Temp 98.8°F | Wt 154.0 lb

## 2018-01-26 DIAGNOSIS — N870 Mild cervical dysplasia: Secondary | ICD-10-CM

## 2018-01-26 DIAGNOSIS — Z7251 High risk heterosexual behavior: Secondary | ICD-10-CM

## 2018-01-26 DIAGNOSIS — B977 Papillomavirus as the cause of diseases classified elsewhere: Secondary | ICD-10-CM

## 2018-01-26 DIAGNOSIS — J4520 Mild intermittent asthma, uncomplicated: Secondary | ICD-10-CM | POA: Diagnosis not present

## 2018-01-26 DIAGNOSIS — F411 Generalized anxiety disorder: Secondary | ICD-10-CM | POA: Diagnosis not present

## 2018-01-26 DIAGNOSIS — F17200 Nicotine dependence, unspecified, uncomplicated: Secondary | ICD-10-CM

## 2018-01-26 DIAGNOSIS — Z114 Encounter for screening for human immunodeficiency virus [HIV]: Secondary | ICD-10-CM

## 2018-01-26 DIAGNOSIS — N72 Inflammatory disease of cervix uteri: Secondary | ICD-10-CM

## 2018-01-26 DIAGNOSIS — B9689 Other specified bacterial agents as the cause of diseases classified elsewhere: Secondary | ICD-10-CM | POA: Diagnosis not present

## 2018-01-26 DIAGNOSIS — N760 Acute vaginitis: Secondary | ICD-10-CM

## 2018-01-26 DIAGNOSIS — Z124 Encounter for screening for malignant neoplasm of cervix: Secondary | ICD-10-CM | POA: Insufficient documentation

## 2018-01-26 DIAGNOSIS — I10 Essential (primary) hypertension: Secondary | ICD-10-CM | POA: Diagnosis not present

## 2018-01-26 NOTE — Patient Instructions (Signed)
Dr Kody Brandl will call you if your tests are not good. Otherwise he will send you a letter.  If you sign up for MyChart online, you will be able to see your test results once Dr Karsyn Rochin has reviewed them.  If you do not hear from us with in 2 weeks please call our office   Safe Sex Practicing safe sex means taking steps before and during sex to reduce your risk of:  Getting an STD (sexually transmitted disease).  Giving your partner an STD.  Unwanted pregnancy.  How can I practice safe sex?  To practice safe sex:  Limit your sexual partners to only one partner who is having sex with only you.  Avoid using alcohol and recreational drugs before having sex. These substances can affect your judgment.  Before having sex with a new partner: ? Talk to your partner about past partners, past STDs, and drug use. ? You and your partner should be screened for STDs and discuss the results with each other.  Check your body regularly for sores, blisters, rashes, or unusual discharge. If you notice any of these problems, visit your health care provider.  If you have symptoms of an infection or you are being treated for an STD, avoid sexual contact.  While having sex, use a condom. Make sure to: ? Use a condom every time you have vaginal, oral, or anal sex. Both females and males should wear condoms during oral sex. ? Keep condoms in place from the beginning to the end of sexual activity. ? Use a latex condom, if possible. Latex condoms offer the best protection. ? Use only water-based lubricants or oils to lubricate a condom. Using petroleum-based lubricants or oils will weaken the condom and increase the chance that it will break.  See your health care provider for regular screenings, exams, and tests for STDs.  Talk with your health care provider about the form of birth control (contraception) that is best for you.  Get vaccinated against hepatitis B and human papillomavirus  (HPV).  If you are at risk of being infected with HIV (human immunodeficiency virus), talk with your health care provider about taking a prescription medicine to prevent HIV infection. You are considered at risk for HIV if: ? You are a man who has sex with other men. ? You are a heterosexual man or woman who is sexually active with more than one partner. ? You take drugs by injection. ? You are sexually active with a partner who has HIV.  This information is not intended to replace advice given to you by your health care provider. Make sure you discuss any questions you have with your health care provider. Document Released: 04/08/2004 Document Revised: 07/16/2015 Document Reviewed: 01/19/2015 Elsevier Interactive Patient Education  [employer]2018 [employer]vier Inc.

## 2018-01-27 ENCOUNTER — Encounter: Payer: Self-pay | Admitting: Family Medicine

## 2018-01-27 DIAGNOSIS — Z7251 High risk heterosexual behavior: Secondary | ICD-10-CM | POA: Insufficient documentation

## 2018-01-27 HISTORY — DX: High risk heterosexual behavior: Z72.51

## 2018-01-27 LAB — HIV ANTIBODY (ROUTINE TESTING W REFLEX): HIV Screen 4th Generation wRfx: NONREACTIVE

## 2018-01-27 LAB — RPR: RPR Ser Ql: NONREACTIVE

## 2018-01-27 MED ORDER — HYDROXYZINE PAMOATE 100 MG PO CAPS
100.0000 mg | ORAL_CAPSULE | Freq: Two times a day (BID) | ORAL | 5 refills | Status: DC | PRN
Start: 2018-01-27 — End: 2018-01-31

## 2018-01-27 MED ORDER — ALBUTEROL SULFATE HFA 108 (90 BASE) MCG/ACT IN AERS: 2.0000 | INHALATION_SPRAY | Freq: Four times a day (QID) | RESPIRATORY_TRACT | 5 refills | Status: DC | PRN

## 2018-01-27 NOTE — Assessment & Plan Note (Signed)
Patient has self-stopped her Amlodipine

## 2018-01-27 NOTE — Assessment & Plan Note (Signed)
[facility] She is requesting having hydroxyzine for prn anxiety. Request was sent to her pharmacy

## 2018-01-27 NOTE — Assessment & Plan Note (Addendum)
Contemplative Thinking about making quit date for the New Year Wants to try Chantix on this next try at quitting. Await a specific quit date.  Gave encouragement about her plan and let her know about our availability to assist as well as state assistance.

## 2018-01-27 NOTE — Progress Notes (Signed)
Subjective:    Patient ID: [name], female    DOB: 06/13/1980, 37 y.o.   MRN: [id number] KRISTEL DURKEE is alone Sources of clinical information for visit is/are patient and past medical records. Nursing assessment for this office visit was reviewed with the patient for accuracy and revision.  Previous Report(s) Reviewed: historical medical records and pathology: old Pap smears  Depression screen [facility] 2/9 09/23/2016  Decreased Interest 0  Down, Depressed, Hopeless 0  PHQ - 2 Score 0  Altered sleeping -  Tired, decreased energy -  Change in appetite -  Feeling bad or failure about yourself  -  Trouble concentrating -  Moving slowly or fidgety/restless -  Suicidal thoughts -  PHQ-9 Score -  Difficult doing work/chores -  Some recent data might be hidden   Fall Risk  09/23/2016 07/13/2016 03/19/2016 09/03/2015 07/31/2015  Falls in the past year? No No No No No  Risk for fall due to : - - - - Other (Comment)     Adult vaccines due  Topic Date Due  . TETANUS/TDAP  11/29/2017   There are no preventive care reminders to display for this patient.  Health Maintenance Due  Topic Date Due  . PAP SMEAR  08/07/2017  . TETANUS/TDAP  11/29/2017     HPI  High Risk sexual activity - Single female partner having vaginal intercourse for greater than 6 months - Pt uncertain that her partner is monogamous.  - No skin/vuvla lesions - No abdominal pain, no pelvic pain, no vagianl discharge, no dysuria.  - No febrile illnesses recently, no flu-like illnesses recently - No abnormal vaginal bleeding - Patient has had treated GC and Chlamydia cervical infections in distant past.  (+) Hot flashes. S/P Salpingectomy bilaterally with preservation of ovaries and uterus.  Cervical Dysplasia or Cancer History Cervical cytology (Pap test) history Date and result of last test: 08/08/2014 History of abnormal Pap smears: Adequate specimen with no evidence of intraepithelial dysplasia but (+)  High-Risk HPV types.  Patient has not had any further HPV or Pap smear testing since this 2016 pap.   SH: (+) tobacco use Review of Systems See HPI    Objective:   Physical Exam VS reviewed GEN: Alert, Cooperative, Groomed, NAD Genitalia:  Normal introitus for age, no external lesions, no vaginal discharge, mucosa pink and moist, no vaginal or cervical lesions, no vaginal atrophy, no friaility or hemorrhage, normal uterus size and position, no adnexal masses or tenderness    Assessment & Plan:  Visit Problem List with A/P  TOBACCO DEPENDENCE Contemplative Thinking about making quit date for the New Year Wants to try Chantix on this next try at quitting. Await a specific quit date.  Gave encouragement about her plan and let her know about our availability to assist as well as state assistance.   Generalized anxiety disorder No longer on her psychotropic medications from [facility]. She is requesting having hydroxyzine for prn anxiety. Request was sent to her pharmacy  Asthma, intermittent Intermittent asthma Controlled Smoking cessation discussed Prn albuterol Rx'd  History of DYSPLASIA, CERVIX, MILD Established problem Recommended one-year cotesting after her (+) high-risk HPV with normal cytology pap did not happen, at least in our records.  Cotesting obtained today. Await results.  If normal cytology and (-) HPV, then repeat co-testing in 3 years.  If >=ASCUS or (+) HPV, then referral for coloposcopy  High risk sexual behavior New problem HPV testing, HIV, RPR, GC/Chlamydia, Trichomonas testing sent Await results.  No acute findings on exam.

## 2018-01-27 NOTE — Assessment & Plan Note (Addendum)
Established problem Recommended one-year cotesting after her (+) high-risk HPV with normal cytology pap did not happen, at least in our records.  Cotesting 01/27/18 showed: Atypical Squamous Cells of Undetermined Significant present (-) High-risk HPV No evidence GC/Chlamydia  (+) Gardnerella  Serologies for HIV and Syphilis were negative   Plan: Referral for coloscopy Flagyl tx of BV

## 2018-01-27 NOTE — Assessment & Plan Note (Addendum)
Intermittent asthma Controlled Smoking cessation discussed Prn albuterol Rx'd

## 2018-01-27 NOTE — Assessment & Plan Note (Signed)
New problem HPV testing, HIV, RPR, GC/Chlamydia, Trichomonas testing sent Await results.  No acute findings on exam.

## 2018-01-30 LAB — CYTOLOGY - PAP
Bacterial vaginitis: POSITIVE — AB
CHLAMYDIA, DNA PROBE: NEGATIVE
Candida vaginitis: NEGATIVE
Diagnosis: UNDETERMINED — AB
HPV: NOT DETECTED
NEISSERIA GONORRHEA: NEGATIVE
TRICH (WINDOWPATH): NEGATIVE

## 2018-01-31 ENCOUNTER — Encounter: Payer: Self-pay | Admitting: Family Medicine

## 2018-01-31 ENCOUNTER — Telehealth: Payer: Self-pay | Admitting: Family Medicine

## 2018-01-31 DIAGNOSIS — K209 Esophagitis, unspecified without bleeding: Secondary | ICD-10-CM

## 2018-01-31 DIAGNOSIS — L700 Acne vulgaris: Secondary | ICD-10-CM

## 2018-01-31 DIAGNOSIS — F411 Generalized anxiety disorder: Secondary | ICD-10-CM

## 2018-01-31 DIAGNOSIS — J4520 Mild intermittent asthma, uncomplicated: Secondary | ICD-10-CM

## 2018-01-31 DIAGNOSIS — N760 Acute vaginitis: Secondary | ICD-10-CM

## 2018-01-31 DIAGNOSIS — B9689 Other specified bacterial agents as the cause of diseases classified elsewhere: Secondary | ICD-10-CM | POA: Insufficient documentation

## 2018-01-31 DIAGNOSIS — F17200 Nicotine dependence, unspecified, uncomplicated: Secondary | ICD-10-CM

## 2018-01-31 HISTORY — DX: Other specified bacterial agents as the cause of diseases classified elsewhere: N76.0

## 2018-01-31 HISTORY — DX: Other specified bacterial agents as the cause of diseases classified elsewhere: B96.89

## 2018-01-31 MED ORDER — ALBUTEROL SULFATE HFA 108 (90 BASE) MCG/ACT IN AERS: 2.0000 | INHALATION_SPRAY | Freq: Four times a day (QID) | RESPIRATORY_TRACT | 5 refills | Status: DC | PRN

## 2018-01-31 MED ORDER — VARENICLINE TARTRATE 1 MG PO TABS: 1.0000 mg | ORAL_TABLET | Freq: Two times a day (BID) | ORAL | 1 refills | Status: DC

## 2018-01-31 MED ORDER — CLINDAMYCIN PHOS-BENZOYL PEROX 1-5 % EX GEL: Freq: Two times a day (BID) | CUTANEOUS | 99 refills | Status: DC

## 2018-01-31 MED ORDER — SUCRALFATE 1 G PO TABS: 1.0000 g | ORAL_TABLET | Freq: Two times a day (BID) | ORAL | 1 refills | Status: DC

## 2018-01-31 MED ORDER — HYDROXYZINE PAMOATE 100 MG PO CAPS: 100.0000 mg | ORAL_CAPSULE | Freq: Two times a day (BID) | ORAL | 5 refills | Status: DC | PRN

## 2018-01-31 MED ORDER — METRONIDAZOLE 500 MG PO TABS: 500.0000 mg | ORAL_TABLET | Freq: Two times a day (BID) | ORAL | 0 refills | Status: AC

## 2018-01-31 MED ORDER — VARENICLINE TARTRATE 0.5 MG X 11 & 1 MG X 42 PO MISC: ORAL | 0 refills | Status: DC

## 2018-01-31 NOTE — Telephone Encounter (Signed)
Patient scheduled for 02/16/18.  Keevin Panebianco,CMA

## 2018-01-31 NOTE — Telephone Encounter (Signed)
Please arrange an appointment in [facility] for possible colposcopy to evaluation.     I spoke with Ms Shirley ExonShadonna [name] about her ASCUS findings on 01/27/18 Thin prep pap, though her HPV testing was negative  Given her prior Pap 2016 with High-risk HPV, I recommended evaluation for cervical abnormalities by colposcopy.   Ms [name] agreed to go for [facility].

## 2018-01-31 NOTE — Addendum Note (Signed)
Addended by: Acquanetta BellingMCDIARMID, Fatih Stalvey D on: 01/31/2018 09:47 AM   Modules accepted: Orders

## 2018-01-31 NOTE — Assessment & Plan Note (Signed)
(+)   vaginal discharge odor Rx Flagyl 500 mg BID x 7 days

## 2018-02-01 ENCOUNTER — Telehealth: Payer: Self-pay

## 2018-02-01 DIAGNOSIS — L700 Acne vulgaris: Secondary | ICD-10-CM

## 2018-02-01 DIAGNOSIS — F411 Generalized anxiety disorder: Secondary | ICD-10-CM

## 2018-02-01 NOTE — Telephone Encounter (Signed)
Fax from pharmacy that Hydroxyzine 100 mg unavailable. Request to change to 50 mg with new directions.  Ples SpecterAlisa Brake, RN [facility] RN)

## 2018-02-01 NOTE — Telephone Encounter (Signed)
Received fax from [facility] stating Clindamycin is not covered. They are requesting Erythromycin, Clindamycinphosphate or Adapalene.

## 2018-02-03 MED ORDER — HYDROXYZINE PAMOATE 50 MG PO CAPS: 50.0000 mg | ORAL_CAPSULE | Freq: Four times a day (QID) | ORAL | 2 refills | Status: DC | PRN

## 2018-02-03 MED ORDER — CLINDAMYCIN PHOSPHATE 1 % EX SWAB: CUTANEOUS | 11 refills | Status: DC

## 2018-02-03 NOTE — Telephone Encounter (Signed)
Rx clindamycin phosphate instead of Clindamycin/benzoyl peroxide combination.

## 2018-02-03 NOTE — Addendum Note (Signed)
Addended byAcquanetta Belling: Rebbecca Osuna D on: 02/03/2018 07:36 AM   Modules accepted: Orders

## 2018-02-16 ENCOUNTER — Other Ambulatory Visit: Payer: Self-pay

## 2018-02-16 ENCOUNTER — Ambulatory Visit (INDEPENDENT_AMBULATORY_CARE_PROVIDER_SITE_OTHER): Payer: Medicare Other | Admitting: Family Medicine

## 2018-02-16 VITALS — BP 138/82 | HR 76 | Temp 98.4°F | Wt 150.0 lb

## 2018-02-16 DIAGNOSIS — Z3202 Encounter for pregnancy test, result negative: Secondary | ICD-10-CM | POA: Diagnosis not present

## 2018-02-16 DIAGNOSIS — R87610 Atypical squamous cells of undetermined significance on cytologic smear of cervix (ASC-US): Secondary | ICD-10-CM | POA: Diagnosis not present

## 2018-02-16 LAB — POCT URINE PREGNANCY: Preg Test, Ur: NEGATIVE

## 2018-02-16 NOTE — Patient Instructions (Signed)
We discussed the pros and cons of getting a colposcopy versus getting co-testing with your next pap smear in a year. We came to the mutual decision to hold off on a colpo and instead opted for the co-testing with your pap in a year.

## 2018-02-17 DIAGNOSIS — R87610 Atypical squamous cells of undetermined significance on cytologic smear of cervix (ASC-US): Secondary | ICD-10-CM

## 2018-02-17 HISTORY — DX: Atypical squamous cells of undetermined significance on cytologic smear of cervix (ASC-US): R87.610

## 2018-02-17 NOTE — Assessment & Plan Note (Signed)
We reviewed her previous paps, options by guidleines. Reviewed her risk facotrs and Fh. Greater than 50% of our 25 minute office visit was spent in counseling and education regarding these issues. Ultimately she decided to do repeat cotesting (Cytologyy with automatic HPV testing) in ne year. She can schedule with me or with her [facility] for that

## 2018-02-17 NOTE — Progress Notes (Signed)
    CHIEF COMPLAINT / HPI: For evaluation abnormal Pap smear.  No pelvic pain.  No unusual vaginal discharge.  REVIEW OF SYSTEMS: See HPI  PERTINENT  PMH / PSH: I have reviewed the patient's medications, allergies, past medical and surgical history, smoking status and updated in the EMR as appropriate.   OBJECTIVE:  WD WN NAD PSYCH: AxOx4. Good eye contact.. No psychomotor retardation or agitation. Appropriate speech fluency and content. Asks and answers questions appropriately. Mood is congruent.  Pap: ASCUS neg Hi risk HPV (01/26/2018) pas on 2016 and 2012 Negative. On 2016 pap there was no hi risk HPV and on 2012 test not ordered.  ASSESSMENT / PLAN:  No problem-specific Assessment & Plan notes found for this encounter.

## 2018-02-21 ENCOUNTER — Ambulatory Visit (INDEPENDENT_AMBULATORY_CARE_PROVIDER_SITE_OTHER): Payer: Medicare Other | Admitting: Family Medicine

## 2018-02-21 ENCOUNTER — Encounter: Payer: Self-pay | Admitting: Family Medicine

## 2018-02-21 ENCOUNTER — Other Ambulatory Visit: Payer: Self-pay

## 2018-02-21 VITALS — BP 104/68 | HR 86 | Temp 98.3°F | Ht 66.0 in | Wt 151.8 lb

## 2018-02-21 DIAGNOSIS — J111 Influenza due to unidentified influenza virus with other respiratory manifestations: Secondary | ICD-10-CM | POA: Diagnosis not present

## 2018-02-21 MED ORDER — BENZONATATE 200 MG PO CAPS: 200.0000 mg | ORAL_CAPSULE | Freq: Three times a day (TID) | ORAL | 0 refills | Status: DC | PRN

## 2018-02-21 MED ORDER — OSELTAMIVIR PHOSPHATE 75 MG PO CAPS: 75.0000 mg | ORAL_CAPSULE | Freq: Two times a day (BID) | ORAL | 0 refills | Status: DC

## 2018-02-21 NOTE — Patient Instructions (Signed)
It was good to see you today  Take the Tamiflu twice daily x 5 days.  Use the Tessalon perles as needed for cough.    If you're not feeling better within the week, come back and see us.  If you begin feeling worse despite treatment, don't wait and come back sooner.
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
## 2018-02-22 ENCOUNTER — Telehealth: Payer: Self-pay | Admitting: *Deleted

## 2018-02-22 NOTE — Telephone Encounter (Signed)
received fax from pharmacy that [name] is not covered by insurance and needs an alternative. Contacted pharmacy, they state that not cough meds are covered by her insurance.  Pt informed that she will need to get meds OTC.  Fleeger, [name], CMA

## 2018-02-22 NOTE — Telephone Encounter (Signed)
Appreciate the help.  

## 2018-03-13 ENCOUNTER — Ambulatory Visit (INDEPENDENT_AMBULATORY_CARE_PROVIDER_SITE_OTHER): Payer: Medicare Other | Admitting: Family Medicine

## 2018-03-13 VITALS — BP 98/70 | HR 74 | Temp 98.4°F | Wt 153.8 lb

## 2018-03-13 DIAGNOSIS — M25562 Pain in left knee: Secondary | ICD-10-CM | POA: Diagnosis not present

## 2018-03-13 DIAGNOSIS — G8929 Other chronic pain: Secondary | ICD-10-CM

## 2018-03-13 NOTE — Assessment & Plan Note (Addendum)
Acute on chronic.  Minimal edema on suprapatellar region.  Unlikely to express fluid with tap.  Patient trying multiple medications including gabapentin and NSAIDs without improvement.  No signs of overlying cellulitis and low concern for septic arthritis.  Gout is less likely given lack of warmth or swelling. - Consent received in corticosteroid injection given to left knee - Advised to take Tylenol up to 600 mg every 6 hours as needed - Reviewed return precautions

## 2018-03-13 NOTE — Patient Instructions (Signed)
Thank you for coming in to see us today. Please see below to review our plan for today's visit.  If you develop a red hot or swollen knee, call us immediately.  You should have pain relief within the next few days.  Please follow-up with your primary care physician if you continue having pain or swelling.  Please call the clinic at [phone] if your symptoms worsen or you have any concerns. It was our pleasure to serve you.  Durward Parcelavid Kerryann Allaire, DO [facility], PGY-3

## 2018-03-13 NOTE — Progress Notes (Signed)
   Subjective   Patient ID: [name]    DOB: 1980/06/21, 37 y.o. female   MRN: [id number]  CC: "Left knee has fluid"  HPI: [name] is a 37 y.o. female who presents to clinic today for the following:  EXTREMITY PAIN  Location: left Pain started: 3 days ago Pain is: shooting and throbbing Severity: 8/10 Medications tried: gabapentin and Mobic without improvement (both for back) Recent trauma: fell at beginning of year and landed on knees Similar pain previously: yes, 2 years ago in both knees  Symptoms Redness: no Swelling: yes Fever: subjective Weakness: limited due to pain Weight loss: no Rash: no  ROS: see HPI for pertinent.  PMFSH: Reviewed. Smoking status reviewed. Medications reviewed.  Objective   BP 98/70   Pulse 74   Temp 98.4 F (36.9 C) (Oral)   Wt 153 lb 12.8 oz (69.8 kg)   SpO2 97%   BMI 24.82 kg/m  Vitals and nursing note reviewed.  General: well nourished, well developed, NAD with non-toxic appearance HEENT: normocephalic, atraumatic, moist mucous membranes Neck: supple, non-tender without lymphadenopathy Cardiovascular: regular rate and rhythm without murmurs, rubs, or gallops Lungs: clear to auscultation bilaterally with normal work of breathing Abdomen: soft, non-tender, non-distended, normoactive bowel sounds Skin: warm, dry, no rashes or lesions, cap refill < 2 seconds Extremities: warm and well perfused, normal tone, trace edema to left knee at supra patellar space with moderate tenderness along with moderate tenderness to the lateral surface of left knee with some crepitus, negative Lockman, negative  Procedure Note: left knee Injection Patient was given informed consent, signed copy in the chart. Appropriate time out was taken. Area prepped and draped in usual sterile fashion. 1 cc of methylprednisolone 40 mg/ml plus 4 cc of 1% lidocaine without epinephrine was injected into the left knee using a(n) anterior approach. The  patient tolerated the procedure well. There were no complications. Post procedure instructions were given.  Assessment & Plan   Chronic pain of left knee Acute on chronic.  Minimal edema on suprapatellar region.  Unlikely to express fluid with tap.  Patient trying multiple medications including gabapentin and NSAIDs without improvement.  No signs of overlying cellulitis and low concern for septic arthritis.  Gout is less likely given lack of warmth or swelling. - Consent received in corticosteroid injection given to left knee - Advised to take Tylenol up to 600 mg every 6 hours as needed - Reviewed return precautions  Orders Placed This Encounter  Procedures  . DG Knee Bilateral Standing AP    Standing Status:   Future    Standing Expiration Date:   09/09/2018    Order Specific Question:   Reason for Exam (SYMPTOM  OR DIAGNOSIS REQUIRED)    Answer:   knee pain on left    Order Specific Question:   Is the patient pregnant?    Answer:   No    Order Specific Question:   Preferred imaging location?    Answer:   [id number]   No orders of the defined types were placed in this encounter.   Durward Parcelavid McMullen, DO [facility], PGY-3 03/13/2018, 4:11 PM

## 2018-03-24 ENCOUNTER — Ambulatory Visit: Payer: Self-pay | Admitting: Family Medicine

## 2018-03-24 ENCOUNTER — Telehealth: Payer: Self-pay | Admitting: Family Medicine

## 2018-03-24 NOTE — Telephone Encounter (Signed)
Patient is seeing Dr. Corky Downs today and discuss results with him.  Jazmin Hartsell,CMA

## 2018-03-24 NOTE — Telephone Encounter (Signed)
Pt called about the result from the last time she saw Dr. Perley Jain. Please call her back. ad

## 2018-03-25 ENCOUNTER — Other Ambulatory Visit: Payer: Self-pay

## 2018-03-25 ENCOUNTER — Encounter (HOSPITAL_COMMUNITY): Payer: Self-pay

## 2018-03-25 ENCOUNTER — Emergency Department (HOSPITAL_COMMUNITY)
Admission: EM | Admit: 2018-03-25 | Discharge: 2018-03-25 | Disposition: A | Discharge status: Home / Self Care | Payer: Medicare Other | Attending: Emergency Medicine | Admitting: Emergency Medicine

## 2018-03-25 DIAGNOSIS — F17210 Nicotine dependence, cigarettes, uncomplicated: Secondary | ICD-10-CM | POA: Insufficient documentation

## 2018-03-25 DIAGNOSIS — Z139 Encounter for screening, unspecified: Secondary | ICD-10-CM

## 2018-03-25 DIAGNOSIS — J45909 Unspecified asthma, uncomplicated: Secondary | ICD-10-CM | POA: Insufficient documentation

## 2018-03-25 DIAGNOSIS — Z0000 Encounter for general adult medical examination without abnormal findings: Secondary | ICD-10-CM | POA: Diagnosis not present

## 2018-03-25 DIAGNOSIS — Z711 Person with feared health complaint in whom no diagnosis is made: Secondary | ICD-10-CM | POA: Diagnosis not present

## 2018-03-25 DIAGNOSIS — Z79899 Other long term (current) drug therapy: Secondary | ICD-10-CM | POA: Insufficient documentation

## 2018-03-25 NOTE — ED Notes (Signed)
Patient verbalizes understanding of discharge instructions. Opportunity for questioning and answers were provided. Armband removed by staff, pt discharged from ED ambulatory.   

## 2018-03-25 NOTE — ED Triage Notes (Signed)
Pt arrives to ED via POV for std check, states she is going through a divorce and had unprotected sex with a new man. Patient denies any vaginal burning, discharge, or urinary frequency. Pt undressed and placed in a gown, pelvic cart set up bedside.
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
## 2018-03-25 NOTE — ED Notes (Signed)
Pt.is getting undress .pelvic cart already set up.gave pt. Warm blanket

## 2018-03-25 NOTE — Discharge Instructions (Addendum)
Follow up with the health department

## 2018-05-03 ENCOUNTER — Other Ambulatory Visit: Payer: Self-pay

## 2018-05-03 ENCOUNTER — Inpatient Hospital Stay (HOSPITAL_COMMUNITY)
Admission: AD | Admit: 2018-05-03 | Discharge: 2018-05-03 | Disposition: A | Discharge status: Home / Self Care | Payer: Medicare Other | Attending: Obstetrics and Gynecology | Admitting: Obstetrics and Gynecology

## 2018-05-03 ENCOUNTER — Encounter (HOSPITAL_COMMUNITY): Payer: Self-pay | Admitting: *Deleted

## 2018-05-03 DIAGNOSIS — R109 Unspecified abdominal pain: Secondary | ICD-10-CM | POA: Insufficient documentation

## 2018-05-03 DIAGNOSIS — B9689 Other specified bacterial agents as the cause of diseases classified elsewhere: Secondary | ICD-10-CM | POA: Diagnosis not present

## 2018-05-03 DIAGNOSIS — F17210 Nicotine dependence, cigarettes, uncomplicated: Secondary | ICD-10-CM | POA: Insufficient documentation

## 2018-05-03 DIAGNOSIS — R102 Pelvic and perineal pain: Secondary | ICD-10-CM | POA: Insufficient documentation

## 2018-05-03 DIAGNOSIS — N760 Acute vaginitis: Secondary | ICD-10-CM

## 2018-05-03 LAB — URINALYSIS, ROUTINE W REFLEX MICROSCOPIC
BILIRUBIN URINE: NEGATIVE
Glucose, UA: NEGATIVE mg/dL
Hgb urine dipstick: NEGATIVE
Ketones, ur: NEGATIVE mg/dL
Leukocytes,Ua: NEGATIVE
Nitrite: NEGATIVE
Protein, ur: NEGATIVE mg/dL
Specific Gravity, Urine: 1.025 (ref 1.005–1.030)
pH: 6.5 (ref 5.0–8.0)

## 2018-05-03 LAB — WET PREP, GENITAL
Sperm: NONE SEEN
Trich, Wet Prep: NONE SEEN
Yeast Wet Prep HPF POC: NONE SEEN

## 2018-05-03 LAB — HEPATITIS B SURFACE ANTIGEN: Hepatitis B Surface Ag: NEGATIVE

## 2018-05-03 MED ORDER — METRONIDAZOLE 0.75 % VA GEL: 1.0000 | Freq: Every day | VAGINAL | 0 refills | Status: DC

## 2018-05-03 NOTE — Discharge Instructions (Signed)
Bacterial Vaginosis    Bacterial vaginosis is a vaginal infection that occurs when the normal balance of bacteria in the vagina is disrupted. It results from an overgrowth of certain bacteria. This is the most common vaginal infection among women ages 15-44.  Because bacterial vaginosis increases your risk for STIs (sexually transmitted infections), getting treated can help reduce your risk for chlamydia, gonorrhea, herpes, and HIV (human immunodeficiency virus). Treatment is also important for preventing complications in pregnant women, because this condition can cause an early (premature) delivery.  What are the causes?  This condition is caused by an increase in harmful bacteria that are normally present in small amounts in the vagina. However, the reason that the condition develops is not fully understood.  What increases the risk?  The following factors may make you more likely to develop this condition:  · Having a new sexual partner or multiple sexual partners.  · Having unprotected sex.  · Douching.  · Having an intrauterine device (IUD).  · Smoking.  · Drug and alcohol abuse.  · Taking certain antibiotic medicines.  · Being pregnant.  You cannot get bacterial vaginosis from toilet seats, bedding, swimming pools, or contact with objects around you.  What are the signs or symptoms?  Symptoms of this condition include:  · Grey or white vaginal discharge. The discharge can also be watery or foamy.  · A fish-like odor with discharge, especially after sexual intercourse or during menstruation.  · Itching in and around the vagina.  · Burning or pain with urination.  Some women with bacterial vaginosis have no signs or symptoms.  How is this diagnosed?  This condition is diagnosed based on:  · Your medical history.  · A physical exam of the vagina.  · Testing a sample of vaginal fluid under a microscope to look for a large amount of bad bacteria or abnormal cells. Your health care provider may use a cotton swab or  a small wooden spatula to collect the sample.  How is this treated?  This condition is treated with antibiotics. These may be given as a pill, a vaginal cream, or a medicine that is put into the vagina (suppository). If the condition comes back after treatment, a second round of antibiotics may be needed.  Follow these instructions at home:  Medicines  · Take over-the-counter and prescription medicines only as told by your health care provider.  · Take or use your antibiotic as told by your health care provider. Do not stop taking or using the antibiotic even if you start to feel better.  General instructions  · If you have a female sexual partner, tell her that you have a vaginal infection. She should see her health care provider and be treated if she has symptoms. If you have a female sexual partner, he does not need treatment.  · During treatment:  ? Avoid sexual activity until you finish treatment.  ? Do not douche.  ? Avoid alcohol as directed by your health care provider.  ? Avoid breastfeeding as directed by your health care provider.  · Drink enough water and fluids to keep your urine clear or pale yellow.  · Keep the area around your vagina and rectum clean.  ? Wash the area daily with warm water.  ? Wipe yourself from front to back after using the toilet.  · Keep all follow-up visits as told by your health care provider. This is important.  How is this prevented?  · Do not   douche.  · Wash the outside of your vagina with warm water only.  · Use protection when having sex. This includes latex condoms and dental dams.  · Limit how many sexual partners you have. To help prevent bacterial vaginosis, it is best to have sex with just one partner (monogamous).  · Make sure you and your sexual partner are tested for STIs.  · Wear cotton or cotton-lined underwear.  · Avoid wearing tight pants and pantyhose, especially during summer.  · Limit the amount of alcohol that you drink.  · Do not use any products that contain  nicotine or tobacco, such as cigarettes and e-cigarettes. If you need help quitting, ask your health care provider.  · Do not use illegal drugs.  Where to find more information  · Centers for Disease Control and Prevention: www.cdc.gov/std  · American Sexual Health Association (ASHA): www.ashastd.org  · U.S. Department of Health and Human Services, Office on Women's Health: www.womenshealth.gov/ or https://www.womenshealth.gov/a-z-topics/bacterial-vaginosis  Contact a health care provider if:  · Your symptoms do not improve, even after treatment.  · You have more discharge or pain when urinating.  · You have a fever.  · You have pain in your abdomen.  · You have pain during sex.  · You have vaginal bleeding between periods.  Summary  · Bacterial vaginosis is a vaginal infection that occurs when the normal balance of bacteria in the vagina is disrupted.  · Because bacterial vaginosis increases your risk for STIs (sexually transmitted infections), getting treated can help reduce your risk for chlamydia, gonorrhea, herpes, and HIV (human immunodeficiency virus). Treatment is also important for preventing complications in pregnant women, because the condition can cause an early (premature) delivery.  · This condition is treated with antibiotic medicines. These may be given as a pill, a vaginal cream, or a medicine that is put into the vagina (suppository).  This information is not intended to replace advice given to you by your health care provider. Make sure you discuss any questions you have with your health care provider.  Document Released: 03/01/2005 Document Revised: 07/05/2016 Document Reviewed: 11/15/2015  Elsevier Interactive Patient Education © 2019 [employer]
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
## 2018-05-03 NOTE — MAU Note (Signed)
Been having pelvic pain, cramping in lower abd. Knows she's not preg, she can't get preg.  Started 2 days ago.

## 2018-05-04 LAB — RPR: RPR: NONREACTIVE

## 2018-05-04 LAB — HEPATITIS C ANTIBODY: HCV Ab: <0.1 s/co ratio (ref 0.0–0.9)

## 2018-05-04 LAB — HIV ANTIBODY (ROUTINE TESTING W REFLEX): HIV Screen 4th Generation wRfx: NONREACTIVE

## 2018-05-05 LAB — GC/CHLAMYDIA PROBE AMP (~~LOC~~) NOT AT ARMC
Chlamydia: NEGATIVE
Neisseria Gonorrhea: POSITIVE — AB

## 2018-05-09 ENCOUNTER — Telehealth: Payer: Self-pay | Admitting: *Deleted

## 2018-05-09 ENCOUNTER — Ambulatory Visit (INDEPENDENT_AMBULATORY_CARE_PROVIDER_SITE_OTHER): Payer: Medicare Other

## 2018-05-09 DIAGNOSIS — A549 Gonococcal infection, unspecified: Secondary | ICD-10-CM | POA: Diagnosis not present

## 2018-05-09 DIAGNOSIS — A5400 Gonococcal infection of lower genitourinary tract, unspecified: Principal | ICD-10-CM

## 2018-05-09 DIAGNOSIS — IMO0002 Reserved for concepts with insufficient information to code with codable children: Secondary | ICD-10-CM

## 2018-05-09 MED ORDER — CEFTRIAXONE SODIUM 250 MG IJ SOLR
250.0000 mg | Freq: Once | INTRAMUSCULAR | Status: AC
  Administered 2018-05-09: 250 mg via INTRAMUSCULAR

## 2018-05-09 NOTE — Telephone Encounter (Signed)
Pt calls because she tested positive for Gonorrhea @ [facility] and she wants to be treated here.  I see where she was informed but not treated and no meds sent it.     Appt made for this afternoon.  Will forward to MD to verbal orders for treatment. Fleeger, [name], CMA

## 2018-05-09 NOTE — Progress Notes (Signed)
   Patient in to nurse clinic for treatment of gonorrhea. Patient given  Ceftriaxone 250 mg IM per verbal order of Dr McDiarmid. Patient advised partner needs treatment and to abstain from sex for 7 days after treatment. Condoms offered and accepted. [facility] Department notified by [facility] hospital per epic note. Ples Specter, RN [facility] RN)

## 2018-05-10 ENCOUNTER — Encounter: Payer: Self-pay | Admitting: *Deleted

## 2018-05-10 ENCOUNTER — Ambulatory Visit: Payer: Medicare Other

## 2018-05-10 DIAGNOSIS — IMO0002 Reserved for concepts with insufficient information to code with codable children: Secondary | ICD-10-CM

## 2018-05-10 DIAGNOSIS — A5400 Gonococcal infection of lower genitourinary tract, unspecified: Principal | ICD-10-CM

## 2018-05-10 DIAGNOSIS — A5429 Other gonococcal genitourinary infections: Secondary | ICD-10-CM

## 2018-05-10 HISTORY — DX: Other gonococcal genitourinary infections: A54.29

## 2018-05-10 HISTORY — DX: Reserved for concepts with insufficient information to code with codable children: IMO0002

## 2018-05-10 NOTE — Telephone Encounter (Signed)
This patient no-showed her AWV appointment. Do you want to call her and send Azithro to her pharmacy?  Ples Specter, RN [facility] RN)

## 2018-05-10 NOTE — Telephone Encounter (Signed)
A/ Gonorrhea infection cervix/urethra      Possible concurrent Chlamydia infection    P/ Please administer regiment at [facility]:    Ceftriaxone 250 mg IM in a single dose                        AND   Azithromycin 1 g orally in a single dose

## 2018-05-12 MED ORDER — AZITHROMYCIN 500 MG PO TABS: 1000.0000 mg | ORAL_TABLET | Freq: Every day | ORAL | 0 refills | Status: DC

## 2018-05-12 NOTE — Addendum Note (Signed)
Addended byPerley Jain, Sequoyah Counterman D on: 05/12/2018 12:40 PM   Modules accepted: Orders

## 2018-05-12 NOTE — Telephone Encounter (Signed)
Left VM informing [name] that needs further tx.  Asked her to call us.   If Dr [name] not available to talk with her, ask nurses on [facility] hall to speak with her about taking Azithromycin she can get at her pharmacy.  It is to treat the possibility of chlamydia co-infecting with gonorrhea.   Rx for Azithromycin 500 mg tablet, take two tablets (1000 mg) by mouth once sent to her [facility] on [address].

## 2018-05-15 ENCOUNTER — Telehealth: Payer: Self-pay

## 2018-05-15 NOTE — Telephone Encounter (Signed)
Patient returning call to PCP about additional treatment needed.  Ples Specter, RN [facility] RN)

## 2018-05-16 NOTE — Telephone Encounter (Signed)
Pt contacted and informed of the additional treatment sent to her pharmacy. All questions answered and directions given on how to take. Pt verbalized understanding.

## 2018-05-17 NOTE — Telephone Encounter (Signed)
[name] pickup up Azithromycin 1000 mg from pharmacy and took it this morning.    She mentioned that she tooked her dgt's stimulant medication and it helped her "mellow out."  She wishes to know if she can get treated for her attention deficit problem.   I asked her to schedule and appointment to see me to discuss.

## 2018-05-18 NOTE — Telephone Encounter (Signed)
appt made for 06-01-2018.  Kymari Lollis,CMA

## 2018-06-01 ENCOUNTER — Ambulatory Visit: Payer: Self-pay | Admitting: Family Medicine

## 2018-06-27 ENCOUNTER — Telehealth (INDEPENDENT_AMBULATORY_CARE_PROVIDER_SITE_OTHER): Payer: Medicare Other | Admitting: Family Medicine

## 2018-06-27 ENCOUNTER — Other Ambulatory Visit: Payer: Self-pay

## 2018-06-27 DIAGNOSIS — F988 Other specified behavioral and emotional disorders with onset usually occurring in childhood and adolescence: Secondary | ICD-10-CM

## 2018-06-27 MED ORDER — METHYLPHENIDATE HCL 10 MG PO TABS: ORAL_TABLET | ORAL | 0 refills | Status: DC

## 2018-06-27 NOTE — Assessment & Plan Note (Signed)
New problem Working diagnosis of Adult ADD Other possible concerns: aggravation of patient's GAD, Panic Attacks, Adjustment Disorders, BiPolar II exacerbation     - The feelings seem more frustration than acute autonomic flares of panics, there are no major depressive symptoms nor symptoms of mania.     - The stress on [name] from the [employer] pandemic confinement with her three children in an apartment could be an adjsutment disorder with work inhibition.  Will try trial of methylphenidate at dose patient responded to in 2008-10 period of 10 mg in am, noon and then a prn dose 3-5 pm  Requested [name] to self-monitor her response to Ritalin therapy.  She should report if she has adverse experiences, such as precipitating panic atacks or increased agitation, as well as failure to improve over next week.   If methyphenidate is helpful without major adverse effects, then will likely continue for a 3 month period.  Will re-address issue in 3 months, hopefully at an outpatient visit.

## 2018-06-27 NOTE — Progress Notes (Signed)
[facility] Telemedicine Visit  Patient consented to have visit conducted via telephone.  Encounter participants: Patient: [name]  Patient Location: Home Provider: Tawanna Cooler Javaughn Opdahl  Others (if applicable): none  Chief Complaint:  Distractability  HPI:  Onset about 2 months ago, with increased distractability during tasks, starting new tasks without returning to prior one, leaving food to burn on stove, leaving water on resulting in water spilling out of sink. "Messing with my anxiety," because she cannot focus for any length of time.  Unable to organize to complete housework.  She now feels overwhelmed by common household tasks - not knowing how or where to start.  These were tasks she did not particularly enjoy previously, but she kept up on them without difficulty.   She denies depressed mood and denies loss of pleasrue in life.  She denies thoughts of self harm. Sleep has been more fragemented but is getting adequate sleep overall. No loss of appetite. No racing thoughts,     Only new stressor is her three children cooped up in their apartment from the pandemic restrictions.  She denies financial, interpersonal stressors.  She now receives SSI-disability income and has dual medicare/Medicaid coverage.   CAGE questions: (-) x 4  SH: Does drink alcohol, 1-2 mixed drinks a couple times a week.  Smokes tobacco.   ROS: see hpi  Pertinent PMHx:  History of alcohol use disorder.  History of childhood ADHD - treated History of adult ADHD - Dx 2008. Treated briefly with ritalin 10 mg TID with target behavior of completing on work tasks at [employer] job. Successful.   Exam:  Respiratory: speaking in full sentence, no audible wheeze, voice prosodic Psych: Speech clear, appropriate audible affective modulation in voice, language concrete and appropriate to situation.   Assessment/Plan:  No problem-specific Assessment & Plan notes found for this  encounter.    Time spent on phone with patient: 23 minutes

## 2018-06-30 DIAGNOSIS — H538 Other visual disturbances: Secondary | ICD-10-CM | POA: Diagnosis not present

## 2018-06-30 DIAGNOSIS — H1013 Acute atopic conjunctivitis, bilateral: Secondary | ICD-10-CM | POA: Diagnosis not present

## 2018-06-30 DIAGNOSIS — H53029 Refractive amblyopia, unspecified eye: Secondary | ICD-10-CM | POA: Diagnosis not present

## 2018-07-17 ENCOUNTER — Other Ambulatory Visit: Payer: Self-pay | Admitting: Family Medicine

## 2018-08-09 ENCOUNTER — Ambulatory Visit: Payer: Medicare Other | Admitting: Family Medicine

## 2018-08-11 ENCOUNTER — Encounter: Payer: Self-pay | Admitting: Family Medicine

## 2018-08-11 ENCOUNTER — Other Ambulatory Visit: Payer: Self-pay

## 2018-08-11 ENCOUNTER — Ambulatory Visit (INDEPENDENT_AMBULATORY_CARE_PROVIDER_SITE_OTHER): Payer: Medicare Other | Admitting: Family Medicine

## 2018-08-11 VITALS — BP 110/70 | HR 80

## 2018-08-11 DIAGNOSIS — Z113 Encounter for screening for infections with a predominantly sexual mode of transmission: Secondary | ICD-10-CM

## 2018-08-11 LAB — POCT WET PREP (WET MOUNT)
Clue Cells Wet Prep Whiff POC: NEGATIVE
Trichomonas Wet Prep HPF POC: ABSENT

## 2018-08-11 NOTE — Assessment & Plan Note (Addendum)
Currently asymptomatic though with partner with +NGU. Wet prep negative. Will test for GC/CT, trichomonas, and M genitalium. Will call with results. Safe sex practices reviewed.

## 2018-08-11 NOTE — Progress Notes (Signed)
  Subjective:   Patient ID: [name]    DOB: Jul 27, 1980, 38 y.o. female   MRN: [id number]  [name] is a 38 y.o. female with a history of HTN, asthma, GERD, atopic dermatitis, lumbar spondylosis, ASCUS w/ high risk HPV, schizoaffective d/o bipolar type, tobacco use, GAD, overweight, chronic pain syndrome here for   STI testing - Patient reports she tested positive for gonorrhea a few months ago, now s/p treatment and no longer symptomatic although her partner recently tested positive for NGU. Reports she has not had unprotected sex since having gonorrhea.  - no abnl discharge, new back pain, pelvic pain, fevers - BTL for contraception  Review of Systems:  Per HPI.  PMFSH, medications and smoking status reviewed.  Objective:   BP 110/70   Pulse 80   LMP 09/05/2016 Comment: tubal ligation Vitals and nursing note reviewed.  General: well nourished, well developed, in no acute distress with non-toxic appearance CV: regular rate and rhythm without murmurs, rubs, or gallops, no lower extremity edema Lungs: clear to auscultation bilaterally with normal work of breathing GYN:  External genitalia within normal limits.  Vaginal mucosa pink, moist, normal rugae.  Nonfriable cervix without lesions. Minimal white discharge noted on speculum exam. No bleeding. No cervical motion tenderness.   Skin: warm, dry, no rashes or lesions Extremities: warm and well perfused, normal tone Neuro: Alert and oriented, speech normal  Assessment & Plan:   Screen for STD (sexually transmitted disease) Currently asymptomatic though with partner with +NGU. Wet prep negative. Will test for GC/CT, trichomonas, and M genitalium. Will call with results. Safe sex practices reviewed.  Orders Placed This Encounter  Procedures  . POCT Wet Prep [facility])   No orders of the defined types were placed in this encounter.  Ellwood Dense, DO PGY-2, [facility] 08/11/2018 11:13 AM

## 2018-08-11 NOTE — Patient Instructions (Signed)
It was great to see you!  Our plans for today:  - We are checking some labs today, we will call you or send you a letter if they are abnormal.  - Be sure to wear a condom every time you have sex for the full time to prevent STIs.  Take care and seek immediate care sooner if you develop any concerns.   Dr. [facility]

## 2018-08-21 ENCOUNTER — Other Ambulatory Visit: Payer: Self-pay | Admitting: Family Medicine

## 2018-08-21 LAB — CT, NG, MYCOPLASMAS NAA, SWAB
Chlamydia trachomatis, NAA: NEGATIVE
Mycoplasma genitalium NAA: NEGATIVE
Mycoplasma hominis NAA: POSITIVE — AB
Neisseria gonorrhoeae, NAA: NEGATIVE
Ureaplasma spp NAA: POSITIVE — AB

## 2018-08-21 MED ORDER — DOXYCYCLINE HYCLATE 100 MG PO TABS: 100.0000 mg | ORAL_TABLET | Freq: Two times a day (BID) | ORAL | 0 refills | Status: AC

## 2018-10-04 DIAGNOSIS — M5414 Radiculopathy, thoracic region: Secondary | ICD-10-CM | POA: Diagnosis not present

## 2018-10-04 DIAGNOSIS — M9902 Segmental and somatic dysfunction of thoracic region: Secondary | ICD-10-CM | POA: Diagnosis not present

## 2018-10-04 DIAGNOSIS — M9903 Segmental and somatic dysfunction of lumbar region: Secondary | ICD-10-CM | POA: Diagnosis not present

## 2018-10-04 DIAGNOSIS — M5137 Other intervertebral disc degeneration, lumbosacral region: Secondary | ICD-10-CM | POA: Diagnosis not present

## 2018-10-04 DIAGNOSIS — M9901 Segmental and somatic dysfunction of cervical region: Secondary | ICD-10-CM | POA: Diagnosis not present

## 2018-10-12 ENCOUNTER — Other Ambulatory Visit: Payer: Self-pay

## 2018-10-12 ENCOUNTER — Encounter: Payer: Self-pay | Admitting: Family Medicine

## 2018-10-12 ENCOUNTER — Telehealth (INDEPENDENT_AMBULATORY_CARE_PROVIDER_SITE_OTHER): Payer: Medicare Other | Admitting: Family Medicine

## 2018-10-12 DIAGNOSIS — Z79891 Long term (current) use of opiate analgesic: Secondary | ICD-10-CM

## 2018-10-12 DIAGNOSIS — L309 Dermatitis, unspecified: Secondary | ICD-10-CM | POA: Insufficient documentation

## 2018-10-12 DIAGNOSIS — K219 Gastro-esophageal reflux disease without esophagitis: Secondary | ICD-10-CM

## 2018-10-12 DIAGNOSIS — F17200 Nicotine dependence, unspecified, uncomplicated: Secondary | ICD-10-CM

## 2018-10-12 DIAGNOSIS — R634 Abnormal weight loss: Secondary | ICD-10-CM | POA: Diagnosis not present

## 2018-10-12 DIAGNOSIS — F411 Generalized anxiety disorder: Secondary | ICD-10-CM

## 2018-10-12 DIAGNOSIS — F3160 Bipolar disorder, current episode mixed, unspecified: Secondary | ICD-10-CM

## 2018-10-12 DIAGNOSIS — I10 Essential (primary) hypertension: Secondary | ICD-10-CM

## 2018-10-12 DIAGNOSIS — L209 Atopic dermatitis, unspecified: Secondary | ICD-10-CM

## 2018-10-12 HISTORY — DX: Dermatitis, unspecified: L30.9

## 2018-10-12 MED ORDER — TRIAMCINOLONE ACETONIDE 0.1 % EX CREA: 1.0000 "application " | TOPICAL_CREAM | Freq: Two times a day (BID) | CUTANEOUS | 3 refills | Status: DC

## 2018-10-12 NOTE — Progress Notes (Signed)
[facility] / E- Visit   This visit type was conducted due to national recommendations for restrictions regarding the COVID-19 Pandemic (e.g. social distancing) in an effort to limit this patient's exposure and mitigate transmission in our community.   Due to their co-morbid illnesses, this patient is at least at moderate risk for complications without adequate follow up: yes   This format is felt to be most appropriate for this patient at this time.  All issues noted in this document were discussed and addressed.  A limited physical exam was performed with this format.   Patient consented to have visit conducted via telephone  Encounter participants: Patient: [name]  Patient Location: Home Provider: Sherren Mocha Nickia Boesen at office  Others (if applicable): none  Chief Complaint: Weight loss  HPI:  Stable: Pt reports that visiting home nurse said patient had loss 100 lbs in last year and that she is now 136 lbs.  [name] denies attempting to lose weight.  [facility] records show highest weight in last year of 160 lbs (03/25/18). Her last weight was 146 lbs (05/03/18). Her highest weight in [facility] is 232 lbs (03/05/2013).   Unintentional Weight Loss  Diet Type: general  Average meal intake, recent: decreased but adequate  Fraility: no, active.  Travels.   Medication effects (See dysgeusia ADE; Abxs, AED, topiramate, Antipsychotics, benzodiazapines,  Digoxin, levodopa, metformin, opiates, SS/SNRI, theophylline, caffeine): No   Level of physical activity: Active;  independent  Polypharmacy (> 4 medications): No   Malignancy Hx*: No   Ongoing inflammatory or increased catabolic condition: No   Recent acute Illness: No   Emotional problems, especially depression*: History of both alcohol use problems and schizoaffective disorder without depressed mood or psychotic symptoms currently.   Alcoholism / Substance Abuse: History of alcohol use disorder.   Currnetly drinks 6-pack on weekends only. Denies needing to cut down, needing eye opener, denies guilt over her drinking, and denies resentment at others concern over her drinking.   GI Disorders Hx:  GERD, which has been quiescent for long time.   Nausea and/or Vomiting: No   GI/Biliary surgeries: No   Eating problems (e.g., inability to feed self): No   Dental or denture problems: No   Diabetes:No ;  Organ Failure (Cardiac, Respiratory, Renal, Liver): No    Autoimmune Disorders (RA, SLE, etc): No    Neurologic Conditions (Stroke, Parkinsons, Chronic Pain, Dementia): No   Symptoms:  General Thirst: No  Fever: No  Night Sweats:No   Rigors: No  Fatigue: No    Cardiovascular Abdominal pain with eating: No  Heart Failure Hx: No  Dyspnea on exertion: No   Respiratory Pulmonary Disease Hx: asthma has been quiescent for long time Dyspnea:  No  Cough: No  Gastrointestinal History of peptic ulcers:  No  Diarrhea: No  Constipation: No   Melena/hemtochezia: no  Genitourinary Flank pain: No  Suprapubic pain: No  Dysuria: No  Frequency: No  Hematuria: No  Vaginal bleeding: no Endocrine Polydipsia: No  Hematologic Anemia History: No  Swollen lymph nodes: No  Musculoskeletal Shoulder stiffness:  No  Muscle strength decrease: No  Joint Swelling/pain: No  Neuropsychologic Prolonged sadness: No  Loss of pleasure: No  Paranoia: No  Anxiousness / fearfullness: No   PMH: Cancer Screening History Breast Cancer: No  Cervical Cancer: ASCUS without hrHPV (01/26/18). Pt plan for repeat co-testing in 1 years planned.  Colorectal Cancer: No  Prostate Cancer: No  Lung Cancer: No  ROS: See PHI  Exam:  Respiratory: speaking in full sentence, no audible wheeze  Assessment/Plan:  Visit Problem List with A/P          COVID-19 Education: The signs and symptoms of COVID-19 were discussed with the patient and how to seek care for testing (follow up with PCP or  arrange E-visit).  The importance of social distancing was discussed today.  Time spent on phone with patient: 21 minutes    ===================================================================

## 2018-10-16 ENCOUNTER — Encounter: Payer: Self-pay | Admitting: Family Medicine

## 2018-10-16 DIAGNOSIS — R634 Abnormal weight loss: Secondary | ICD-10-CM | POA: Insufficient documentation

## 2018-10-16 HISTORY — DX: Abnormal weight loss: R63.4

## 2018-10-16 NOTE — Assessment & Plan Note (Signed)
Patient followed at [facility] Clydell Hakim, MD) who prescribe Ms Drina Jobst hydrocodone/apap 5/325, Robaxin and Meloxicam.   Last Rx refill hydrocodone/apap 5/325 was 10/31/17

## 2018-10-19 ENCOUNTER — Other Ambulatory Visit: Payer: Self-pay | Admitting: Family Medicine

## 2018-11-13 ENCOUNTER — Encounter: Payer: Self-pay | Admitting: Family Medicine

## 2018-12-25 DIAGNOSIS — M9902 Segmental and somatic dysfunction of thoracic region: Secondary | ICD-10-CM | POA: Diagnosis not present

## 2018-12-25 DIAGNOSIS — M5414 Radiculopathy, thoracic region: Secondary | ICD-10-CM | POA: Diagnosis not present

## 2018-12-25 DIAGNOSIS — M9901 Segmental and somatic dysfunction of cervical region: Secondary | ICD-10-CM | POA: Diagnosis not present

## 2018-12-25 DIAGNOSIS — M5137 Other intervertebral disc degeneration, lumbosacral region: Secondary | ICD-10-CM | POA: Diagnosis not present

## 2018-12-25 DIAGNOSIS — M9903 Segmental and somatic dysfunction of lumbar region: Secondary | ICD-10-CM | POA: Diagnosis not present

## 2019-01-03 ENCOUNTER — Other Ambulatory Visit: Payer: Self-pay | Admitting: Family Medicine

## 2019-01-03 DIAGNOSIS — J4520 Mild intermittent asthma, uncomplicated: Secondary | ICD-10-CM

## 2019-02-14 ENCOUNTER — Other Ambulatory Visit: Payer: Self-pay | Admitting: Family Medicine

## 2019-02-14 DIAGNOSIS — J4520 Mild intermittent asthma, uncomplicated: Secondary | ICD-10-CM

## 2019-03-21 ENCOUNTER — Telehealth: Payer: Self-pay

## 2019-03-21 DIAGNOSIS — M545 Low back pain, unspecified: Secondary | ICD-10-CM

## 2019-03-21 DIAGNOSIS — M5136 Other intervertebral disc degeneration, lumbar region: Secondary | ICD-10-CM

## 2019-03-21 DIAGNOSIS — G894 Chronic pain syndrome: Secondary | ICD-10-CM

## 2019-03-21 DIAGNOSIS — M47817 Spondylosis without myelopathy or radiculopathy, lumbosacral region: Secondary | ICD-10-CM

## 2019-03-21 DIAGNOSIS — M47816 Spondylosis without myelopathy or radiculopathy, lumbar region: Secondary | ICD-10-CM

## 2019-03-21 NOTE — Telephone Encounter (Signed)
Pt calling to get a referral for a new pain management office. Please give her a call when this has been done and where the referral is being sent so she can call and schedule an appt. Pt has been in pain for 2 weeks. Sunday Spillers, CMA

## 2019-03-23 ENCOUNTER — Other Ambulatory Visit: Payer: Self-pay | Admitting: Family Medicine

## 2019-03-23 ENCOUNTER — Encounter: Payer: Self-pay | Admitting: Family Medicine

## 2019-03-23 NOTE — Telephone Encounter (Signed)
Letter of Referral to Dr Renda Rolls office completed.

## 2019-03-23 NOTE — Telephone Encounter (Signed)
Please let [name] know that her Referral to Pain Center has been made.   The referral is to Romero Belling (Physical Medicine and Rehabilitation) at [facility].

## 2019-03-23 NOTE — Progress Notes (Signed)
Letter of referral to Dr Maurice Small (PM&R) for eval and tx of [name]'s chronic back pain.

## 2019-03-23 NOTE — Telephone Encounter (Signed)
Spoke with pt. Informed her of referral to [facility].  Pt understood. Aquilla Solian, CMA

## 2019-03-23 NOTE — Addendum Note (Signed)
Addended byAcquanetta Belling D on: 03/23/2019 08:32 AM   Modules accepted: Orders

## 2019-05-21 ENCOUNTER — Ambulatory Visit: Payer: Medicare Other | Attending: Internal Medicine

## 2019-05-21 ENCOUNTER — Other Ambulatory Visit: Payer: Self-pay

## 2019-05-21 ENCOUNTER — Encounter: Payer: Self-pay | Admitting: Family Medicine

## 2019-05-21 DIAGNOSIS — Z20822 Contact with and (suspected) exposure to covid-19: Secondary | ICD-10-CM

## 2019-05-22 ENCOUNTER — Telehealth: Payer: Self-pay

## 2019-05-22 LAB — NOVEL CORONAVIRUS, NAA: SARS-CoV-2, NAA: NOT DETECTED

## 2019-05-22 NOTE — Telephone Encounter (Signed)
Patient calls nurse line stating she needs a note for her job stating she has a pending covid test and can not return back to work just yet. Ok with PCP to write. Patient will access through mychart.

## 2019-05-23 ENCOUNTER — Telehealth: Payer: Self-pay

## 2019-05-23 DIAGNOSIS — M5136 Other intervertebral disc degeneration, lumbar region: Secondary | ICD-10-CM

## 2019-05-23 DIAGNOSIS — M47817 Spondylosis without myelopathy or radiculopathy, lumbosacral region: Secondary | ICD-10-CM

## 2019-05-23 DIAGNOSIS — M545 Low back pain, unspecified: Secondary | ICD-10-CM

## 2019-05-23 DIAGNOSIS — M47816 Spondylosis without myelopathy or radiculopathy, lumbar region: Secondary | ICD-10-CM

## 2019-05-23 DIAGNOSIS — G894 Chronic pain syndrome: Secondary | ICD-10-CM

## 2019-05-23 NOTE — Telephone Encounter (Signed)
Patient calls nurse line stating [name] can not see her for pain management. Patient stated they gave her the name of a provider who specializes in what she needs, Dr. Ewing Schlein. Please update the referral.

## 2019-05-23 NOTE — Telephone Encounter (Signed)
Referral to Dr Jonathon Bellows for chronic pain management ordered.  Please let Ms Kiger know.

## 2019-07-31 DIAGNOSIS — F250 Schizoaffective disorder, bipolar type: Secondary | ICD-10-CM | POA: Insufficient documentation

## 2019-09-25 ENCOUNTER — Ambulatory Visit (HOSPITAL_COMMUNITY)
Admission: EM | Admit: 2019-09-25 | Discharge: 2019-09-25 | Disposition: A | Discharge status: Home / Self Care | Payer: Medicare Other | Attending: Physician Assistant | Admitting: Physician Assistant

## 2019-09-25 ENCOUNTER — Encounter (HOSPITAL_COMMUNITY): Payer: Self-pay

## 2019-09-25 ENCOUNTER — Other Ambulatory Visit: Payer: Self-pay

## 2019-09-25 DIAGNOSIS — J029 Acute pharyngitis, unspecified: Secondary | ICD-10-CM | POA: Diagnosis not present

## 2019-09-25 DIAGNOSIS — Z20822 Contact with and (suspected) exposure to covid-19: Secondary | ICD-10-CM | POA: Insufficient documentation

## 2019-09-25 LAB — SARS CORONAVIRUS 2 (TAT 6-24 HRS): SARS Coronavirus 2: NEGATIVE

## 2019-09-25 LAB — POCT RAPID STREP A: Streptococcus, Group A Screen (Direct): NEGATIVE

## 2019-09-25 MED ORDER — FLUTICASONE PROPIONATE 50 MCG/ACT NA SUSP: 1.0000 | Freq: Every day | NASAL | 0 refills | Status: DC

## 2019-09-25 MED ORDER — IBUPROFEN 400 MG PO TABS: 400.0000 mg | ORAL_TABLET | Freq: Four times a day (QID) | ORAL | 0 refills | Status: DC | PRN

## 2019-09-25 MED ORDER — CEPACOL SORE THROAT 5.4 MG MT LOZG: 1.0000 | LOZENGE | OROMUCOSAL | 0 refills | Status: DC | PRN

## 2019-09-25 NOTE — ED Triage Notes (Signed)
Patient presents today with complaints of a sore throat, chills that began yesterday. Patient states she began feeling fatigue on Sunday.

## 2019-09-25 NOTE — Discharge Instructions (Signed)
The strep test was negative. We sent a culture, we will call if this requires a change in treatment.  Use lozenge for pain relief Take ibuprofen every 6 hours Use flonase daily Albuterol as needed only  If not improving over next 3-5 days return, or sooner if worsening  If your Covid-19 test is positive, you will receive a phone call from [facility] regarding your results. Negative test results are not called. Both positive and negative results area always visible on MyChart. If you do not have a MyChart account, sign up instructions are in your discharge papers.   Persons who are directed to care for themselves at home may discontinue isolation under the following conditions:   At least 10 days have passed since symptom onset and  At least 24 hours have passed without running a fever (this means without the use of fever-reducing medications) and  Other symptoms have improved.  Persons infected with COVID-19 who never develop symptoms may discontinue isolation and other precautions 10 days after the date of their first positive COVID-19 test.
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
## 2019-09-26 ENCOUNTER — Ambulatory Visit: Payer: Medicare Other

## 2019-09-27 LAB — CULTURE, GROUP A STREP (THRC)

## 2019-10-08 DIAGNOSIS — G8929 Other chronic pain: Secondary | ICD-10-CM | POA: Diagnosis not present

## 2019-10-08 DIAGNOSIS — R011 Cardiac murmur, unspecified: Secondary | ICD-10-CM | POA: Diagnosis not present

## 2019-10-08 DIAGNOSIS — J45909 Unspecified asthma, uncomplicated: Secondary | ICD-10-CM | POA: Diagnosis not present

## 2019-10-08 DIAGNOSIS — Z79891 Long term (current) use of opiate analgesic: Secondary | ICD-10-CM | POA: Diagnosis not present

## 2019-10-08 DIAGNOSIS — M545 Low back pain: Secondary | ICD-10-CM | POA: Diagnosis not present

## 2019-10-08 DIAGNOSIS — R5382 Chronic fatigue, unspecified: Secondary | ICD-10-CM | POA: Diagnosis not present

## 2019-10-08 DIAGNOSIS — M25519 Pain in unspecified shoulder: Secondary | ICD-10-CM | POA: Diagnosis not present

## 2019-10-08 DIAGNOSIS — G894 Chronic pain syndrome: Secondary | ICD-10-CM | POA: Diagnosis not present

## 2019-10-08 DIAGNOSIS — M47897 Other spondylosis, lumbosacral region: Secondary | ICD-10-CM | POA: Diagnosis not present

## 2019-11-05 DIAGNOSIS — G894 Chronic pain syndrome: Secondary | ICD-10-CM | POA: Diagnosis not present

## 2019-11-16 DIAGNOSIS — M5137 Other intervertebral disc degeneration, lumbosacral region: Secondary | ICD-10-CM | POA: Diagnosis not present

## 2019-11-16 DIAGNOSIS — M5414 Radiculopathy, thoracic region: Secondary | ICD-10-CM | POA: Diagnosis not present

## 2019-11-16 DIAGNOSIS — M9901 Segmental and somatic dysfunction of cervical region: Secondary | ICD-10-CM | POA: Diagnosis not present

## 2019-11-16 DIAGNOSIS — M9902 Segmental and somatic dysfunction of thoracic region: Secondary | ICD-10-CM | POA: Diagnosis not present

## 2019-11-16 DIAGNOSIS — M9903 Segmental and somatic dysfunction of lumbar region: Secondary | ICD-10-CM | POA: Diagnosis not present

## 2019-12-18 ENCOUNTER — Telehealth: Payer: Self-pay | Admitting: *Deleted

## 2019-12-18 NOTE — Telephone Encounter (Signed)
Patient states that since starting menopause she has to use the restroom more often.   She needs a note from PCP allowing her to go to the restroom more often.  To PCP. Jone Baseman, CMA

## 2019-12-19 NOTE — Telephone Encounter (Signed)
I am covering for Dr. Perley Jain.  By my review of the chart, she has not been seen in our office for 1.5 years.  She has other primary care visits in the interim.   Her best option is to schedule an appointment with Dr. McDiarmid. A backup plan would be for her to wait for Dr. McDiarmid to return and see if he is willing to create a letter for her.

## 2019-12-25 NOTE — Telephone Encounter (Signed)
PCP is back in the office.  Routing to him for next steps.  Jone Baseman, CMA

## 2019-12-25 NOTE — Telephone Encounter (Signed)
A letter requesting increase restroom visits while at works was sent online to her MyChart.

## 2020-01-17 ENCOUNTER — Ambulatory Visit: Payer: Medicare Other | Admitting: Family Medicine

## 2020-01-17 NOTE — Progress Notes (Deleted)
Dyspnea Onset(time, acute vs non-acute): *** Severity: *** Function impairment: *** Course: *** Pattern: *** Quality(work to breath, tightness, heavy, rapid): *** Makes Better: *** Makes Worse: *** Prior  work-up:  ***  PMH COPD: {YES/NO/WILD CARDS:18581} ILD: {YES/NO/WILD CARDS:18581} OSA / OHS: {YES/NO/WILD FUXNA:35573} Collagen Vasc Disorder / Autoimmune Disorder:  {YES/NO/WILD CARDS:18581} Asthma: {YES/NO/WILD CARDS:18581} Heart Failure: {YES/NO/WILD CARDS:18581} Heart Valve Disease: {YES/NO/WILD UKGUR:42706} VTE: {YES/NO/WILD CBJSE:83151} Coronary Disease: {YES/NO/WILD VOHYW:73710} Cancer: {YES/NO/WILD CARDS:18581} Abnormal Uterine Bleeding: {YES/NO/WILD CARDS:18581} Aspiration risk:  {YES/NO/WILD GYIRS:85462} GERD: {YES/NO/WILD VOJJK:09381}  Renal Failure / Insufficiency: {YES/NO/WILD WEXHB:71696} Liver disease: {YES/NO/WILD VELFY:10175} History of Pneumonia: {YES/NO/WILD CARDS:18581} Tobacco use: {YES/NO/WILD CARDS:18581} Cocaine-derivative use: {YES/NO/WILD CARDS:18581}   Associated Symptoms Recent head or chest cold: {YES/NO/WILD CARDS:18581} Cough: {YES/NO/WILD CARDS:18581} Sputum: {YES/NO/WILD CARDS:18581} Hemoptysis: {YES/NO/WILD ZWCHE:52778} Fever:{YES/NO/WILD CARDS:18581} Chills: {YES/NO/WILD CARDS:18581} Headache: {YES/NO/WILD CARDS:18581} Myalgias  / artralgias: {YES/NO/WILD CARDS:18581} Rhinorrhea: {YES/NO/WILD CARDS:18581} Sore throat: {YES/NO/WILD CARDS:18581} Wheezing: {YES/NO/WILD CARDS:18581} Chest Pain: {YES/NO/WILD EUMPN:36144} Pleuritic Chest Pain: {YES/NO/WILD RXVQM:08676} Palpitations: {YES/NO/WILD CARDS:18581} Leg swelling: {YES/NO/WILD CARDS:18581} Weight gain: {YES/NO/WILD CARDS:18581} Orthopnea: {YES/NO/WILD CARDS:18581} Parox Noct Dyspnea: {YES/NO/WILD CARDS:18581} Dyspnea on Exertion: {YES/NO/WILD CARDS:18581} Decrease urine output: {YES/NO/WILD CARDS:18581} Leg pain, new: {YES/NO/WILD PPJKD:32671} Melena / BRBPR: {YES/NO/WILD  IWPYK:99833} Blood thinners / Aspirin: {YES/NO/WILD ASNKN:39767} Inhalers:  {YES/NO/WILD CARDS:18581} Indigestion / Reflux: {YES/NO/WILD HALPF:79024} Melena / BRBPR: {YES/NO/WILD CARDS:18581}    Risk Factors Recent immobilization/surgery:{YES/NO/WILD CARDS:18581}  Leg trauma recently: {YES/NO/WILD OXBDZ:32992} Recent long travel: {YES/NO/WILD CARDS:18581} Hormonal theray:{YES/NO/WILD CARDS:18581}  CAD risk factors: {causes; cad:19606}  Illness Exposures:{YES/NO/WILD EQAST:41962}  Dysphagia:{YES/NO/WILD IWLNL:89211}     Physical exam  Vitals Wt Readings from Last 3 Encounters:  05/03/18 146 lb 4 oz (66.3 kg)  03/25/18 160 lb (72.6 kg)  03/13/18 153 lb 12.8 oz (69.8 kg)   Temp Readings from Last 3 Encounters:  09/25/19 98.5 F (36.9 C) (Oral)  05/03/18 98.2 F (36.8 C) (Oral)  03/25/18 98.4 F (36.9 C) (Oral)   BP Readings from Last 3 Encounters:  09/25/19 (!) 137/94  08/11/18 110/70  05/03/18 (!) 130/95   Pulse Readings from Last 3 Encounters:  09/25/19 79  08/11/18 80  05/03/18 70   There were no vitals filed for this visit. @LASTSAO2 (3)@  General:{EXAM; GENERAL APPEARANCE:5021} HEENT: Conjunctiva {pe conjunctival injection:311494}; Nose/sinuses {EXAM; NOSE ; Pharynx {Exam; posterior pharynx:14892}; Neck {CHL ONC PE HER:74081} Cor: {heart exam:315510::"normal rate, regular rhythm, normal S1, S2, no murmurs, rubs, clicks or gallops"} Lungs: {SYSTEM LUNGS ADULT/PED EXAM:21906} Ext:{Exam; extremity:10330}  EKG: {Findings; ecg:5101::"normal EKG, normal sinus rhythm","unchanged from previous tracings"}  LABS/imaging:  {icu labs:18171} BNP: *** (last BNP: ***) D-dimer: *** Troponins: *** CBC: *** CMP/BMP: *** ABG: *** TSH: *** UDS: *** Cultures (Sputum, blood): *** CHEST XRAY: *** CTA / V-Q: *** EKG ETT: *** Echo: *** Spirometry: ***  DDX of  difficult airways management almost all start with A and  include  Adherence,  Ace  Inhibitors,  Acid Reflux, Active Sinus Disease,  Alpha 1 Antitripsin deficiency,  Anxiety masquerading as Airways dz,   ABPA,   Allergy(esp in young), Aspiration (esp in elderly), Adverse effects of meds,  Active smokers,  A bunch of PE's (a small clot burden can't cause this syndrome unless there is already severe underlying pulm or vascular dz with poor reserve)  plus   two Bs  = Bronchiectasis and Beta blocker use. and   one C= CHF Cardiovascular system  Acute myocardial ischemia (Chest pain, rest/exertion, palpitation, Hx CAD, DM, HTN, HLP, FH)  Heart failure (Cough, edema, orthopnea, DOE, Hx HF or abnl  EF%, valve disorder, Hx CAD, lightheadedness/dizziness)   Cardiac tamponade  Respiratory system  Bronchospasm  Pulmonary embolism  Pneumothorax  Pulmonary infection - bronchitis, pneumonia  Upper airway obstruction - aspiration, anaphylaxis Anemia  Urinary Incontinence Template Change daily activities because of urinary incontinence?*** How big a problem has urinary incontinence been to social activity in last month? ***  HPI General   Onset {desc; hpi onset:60104}     Frequency {Frequency; infant stooling:16656}             Volume {Stool volume:12365}     Timing  {Time; of day:12576}  Preciptants (Cough, sneeze, lifting, exercising) ***  Sudden Urge to void {YES/NO/WILD CARDS:18581} Red Flags (possible neurologic or neoplastic cause)   Sudden onset {YES/NO/WILD CARDS:18581}    Hematuria {YES/NO/WILD CARDS:18581}     Pelvic Pain {YES/NO/WILD CARDS:18581}    Severe Straining {YES/NO/WILD CARDS:18581}   Inability to void {YES/NO/WILD CARDS:18581} Lower Tract symptoms          Frequency {YES/NO/WILD HQION:62952}    Nocturia {YES/NO/WILD CARDS:18581}             Slow stream {YES/NO/WILD CARDS:18581}    Hesitancy {YES/NO/WILD CARDS:18581}  Dribbling {YES/NO/WILD CARDS:18581}   Interrupted voiding {YES/NO/WILD WUXLK:44010}  Dysuria {YES/NO/WILD  UVOZD:66440}   Other Medical Conditions (heart failure, renal failure, DM, CVA, dementia, MS, NPH, Parkinsonism,   Spinal stenosis, peripheral neuropathy, alcohol use disorder, chronic cough, constipation, urologic surgeries, gynecologic surgeries, BPH, pelvic prolapse, Hx GC urethritis, impaired mobility) *** Medications/substances (Diuretics, alpha adrenergic blockers, alpha adrenergic agonist, Cholinesterase inhibitors, ACEI (cough), anticholinergics, Amlodipine (edema), oral estogens (worsens stress/mixed incontinence), GABA-nergics (edema & nocturia), SGLT2I (diuresis), sympathomimetics, antipsychotics, NSAIDs (edema, nocturia), opioids, sedative-hypnotics, Thiazolidinediones, caffeine, alcohol): *** Ready availability of commode *** Assistance with toileting . Manual Dexerity  {Desc; adequate/inadequate:30688} . Transfer ability  {Desc; adequate/inadequate:30688} . Ambulatory ability {Desc; adequate/inadequate:30688}  How incontinence affects life ***                How distressing is the incontinence {Desc; no/some/marked:19219} Physical Exam Cardiovascular: Rate ***           Rhthym ***             M/G/R ***       JVD ***       leg edema {Exam; edema:10248} Lung: Crackles {absent/present:22727} Abd: palpable bladder {absent/present:22727}  GU:  Rectal Tone {PT TONE EVAL:22083}                                  Prostate/penis {Penis/prostate:16964}                   Pelvic: {vagina exam:311643::"not indicated"}; {Pelvic supports:32131} Neuro: Cognition {chl bhh cognition:304700322}                      Babinski   {Exam; babinski:18611}                      Sensory monofilament: {Neuro sensory:31846}            Gait {desc; gait:10592}  LABS Urinalysis with microscopy (at initial evaluation or worsening of symptoms)   Hematuria {absent/present:22727}            Glycosuria {absent/present:22727} Urine Culture *** Serum Creatinine (PVR > 300 cc) *** Serum Calcium and Glucose (polyuric)  *** Serum Vitamin B12 (High PVR, urinary retention) ***  Further testing Post-Void Residual (DMT2, urinary retention history,  recurrent UTIs, severe constipation, medications impairing bladder emptying, pelvic organ prolapse, prior surgery for urinary incontinence) Bladder Diary (time, volume of incontinence, activity, time in sleep) AUA BPH Symptom Index Cystoscopy (pelvic pain, hematuria that does not clear after UTI treatment) Urodynamic Testing (Unclear etiology of incontinence, high PVR,  empiric treatment failed & patient considering surgical therapy)  Classification of Urinary Incontinence  STRESS URGE OVERFLOW (BOO) OVERFLOW (Detrusor Underactivity)  History Precipitated by increased abd pressure (sneeze, cough, lift, exercise) . Urgency . Frequency . Nocturia . High PVR . Frequency . Nocturia . Weak stream . Hesitancy . Straining . Frequent small volume voids . High PVR . Frequency . Nocturia . Weak stream . Hesitancy . Frequent small volume voids  Etiology . Impaired pelvic support . Failure of urethral closure (trauma, anti-incontinence surgery, urethral atrophy, s/p prostatectomy,  Vaginal atrophy) . Detrusor overactivity (age-related, idiopathic, nervous system lesion, bladder irritation) . Mixed Urge and Stress Incontinence  . BPH . Prostate Cancer . Urethral Stricture . Anti-incontinence surgery . Pelvic Prolapse, severe  . Peripheral Neuropathy (DM, B12 Deficiency, alcoholism) . Damaged spinal detrusor afferent nerves (HNP, spinal stenosis, tumor, degenerative neurologic disease) . Detrusor fibrosis   Non-pharmacologic Management . Classification incontinence type and possible etiology  . Minimize contributing factors (meds, optimize medical conditions, chronic cough, optimize physical functioning, accessibility of commode, commode seat height & railings,  complex clothing,) . Treat constipation non-pharmacologically and pharmacologically . Weight loss  (decrease incontinence with weight loss of 8 pounds)   . Smoking Cessation to decrease cough . Behavioral Therapy management in stepped approach  o Fluid and caffeine management ( 32 ou < daily fluid intake < 64 ou, no fluids < 4 hours before bedtime) o Bladder Training (urge) for gradual increase time between voids - Scheduled Voids every 2 hours during day, increase time between  gradually  - Urge suppression ("Freeze and Squeeze" method) o Prompted void (offer voiding every 2 hours during day) o Pelvic muscle exercise (stress, urge, and mixed, s/p prostatectomy) . Vaginal pessaries (pelvic organ prolapse) . Surgical management o Stress Incontinence - Sling Procedure (inadequate response to behavioral and pharmacologic therapies)  - Periurethral bulking agent o Urge Incontinence - Sacral Neuromodulation (implanted stimulator for refractory urgency - Intravesicular Botox injection Pharmacologic Management . Antimuscarinics anticholinergics . Beta-3 adrenergic antagonists . Topical vaginal estrogen in postmenopausal vaginal atrophy may help with stress and urge incontinence Supportive Management . Pads and protective undergarments . Water-resistant covers for beds and chairs Follow-up . Response to treatment documented within 3 months

## 2020-01-22 ENCOUNTER — Ambulatory Visit: Payer: Medicare Other

## 2020-01-28 ENCOUNTER — Other Ambulatory Visit: Payer: Self-pay

## 2020-01-28 ENCOUNTER — Ambulatory Visit (INDEPENDENT_AMBULATORY_CARE_PROVIDER_SITE_OTHER): Payer: Medicare Other | Admitting: Family Medicine

## 2020-01-28 ENCOUNTER — Encounter: Payer: Self-pay | Admitting: Family Medicine

## 2020-01-28 ENCOUNTER — Other Ambulatory Visit (HOSPITAL_COMMUNITY)
Admission: RE | Admit: 2020-01-28 | Discharge: 2020-01-28 | Disposition: A | Discharge status: Home / Self Care | Payer: Medicare Other | Source: Ambulatory Visit | Attending: Family Medicine | Admitting: Family Medicine

## 2020-01-28 VITALS — BP 112/70 | Ht 66.0 in | Wt 167.0 lb

## 2020-01-28 DIAGNOSIS — N760 Acute vaginitis: Secondary | ICD-10-CM | POA: Diagnosis not present

## 2020-01-28 DIAGNOSIS — B9689 Other specified bacterial agents as the cause of diseases classified elsewhere: Secondary | ICD-10-CM | POA: Insufficient documentation

## 2020-01-28 DIAGNOSIS — F411 Generalized anxiety disorder: Secondary | ICD-10-CM

## 2020-01-28 DIAGNOSIS — Z113 Encounter for screening for infections with a predominantly sexual mode of transmission: Secondary | ICD-10-CM
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
## 2020-01-28 NOTE — Assessment & Plan Note (Addendum)
PHA-9 score of 9, no SI/HI.  Has current stressor of work and being a single mother, has missed work due to anxiety symptoms. -Patient given therapy resources with goal to call and schedule an appointment with a new provider within the week. -Follow-up in 1 week to check in.

## 2020-01-28 NOTE — Patient Instructions (Addendum)
It was a great seeing you today!  Today we discussed the following:  STD screening: There is some mild discharge that was present, this appears normal and nonconcerning.  I will call you with your results.   Anxiety: I want you to call a therapist and have something set-up in the next week and determine what goals you have for treatment and therapy direction. I will check with your chart and Dr. McDiarmid about mediation recommendations.           Psychiatry Resource List (Adults and Children) Most of these providers will take Medicaid. please consult your insurance for a complete and updated list of available providers. When calling to make an appointment have your insurance information available to confirm you are covered.   [facility]:Psychiatry and Counseling [address] [place], [place] [address] [phone]  [facility]  [address] [place], [place] [phone] Crisis [phone]   [facility]:   [facility]: [address]     [phone]   [facility]: [address] [place]. [place],        [phone] [place]: [address],    [phone] Kathryne Sharper: [address],                   [phone] Children: [facility] [address]         [phone]   [facility]  (Psychiatry only; Adults /children 12 and over, will take Medicaid)  [address] Laurell Josephs [address], [place], [place] [address]       [phone]   [facility] (Psychiatry & counseling ; adults & children ; will take Medicaid [address]  [place] [address]   Go on-line to complete referral ( [email or web address]/[email or web address]   (Spanish therapist)  [facility]  Psychiatry & counseling; Adults and children;  Call Registration prior to scheduling an appointment  [phone] [address]    [place], [place] [address]    [phone]  [facility] (Psychiatry & counseling; adults & children; Medicare no Medicaid)  [address]   [place], [place]  [address]      [phone]    [facility] (up to age 23)  Psychiatry & counseling ,will take Medicaid, must do counseling to receive psychiatry services  [address] [place] [address]        [phone]  [facility] (Psychiatry & counseling; adults & children; will take Medicaid) Will need a referral from provider [address],  [place], [place]  [phone]   [facility] --- Walk-In Mon-Friday 8am-3pm ( will take Medicaid, Psychiatry, Adults & children,  [address], [place], [place]   [phone]   [facility] [place], Walk-in M-F 8am-12pm and 1pm -3pm   (Counseling, Psychiatry, will take Medicaid, adults & children)  [address] [place], [place]  [phone]           If you are feeling suicidal or depression symptoms worsen please immediately go to:   24 Hour Availability [facility]  [address] [place], [place] [phone] Crisis [phone]    . If you are thinking about harming yourself or having thoughts of suicide, or if you know someone who is, seek help right away. . Call your doctor or  mental health care provider. . Call 911 or go to a hospital emergency room to get immediate help, or ask a friend or family member to help you do these things. . Call the [place] National Suicide Prevention Lifeline's toll-free, 24-hour hotline at 1-800-273-TALK 5182132942) or TTY: 1-800-799-4 TTY (978)113-8558) to talk to a trained counselor. . If you are in crisis, make sure you are not left alone.  . If someone else is in crisis, make sure he or she is not left alone   [facility] [employer] (Domestic Violence, Rape &  Victim Assistance [phone]  [facility]    (ONLY from 8am-4pm)    [phone]  [facility] Mobile Crisis Unit (24/7)   [phone]  [place] National Suicide Hotline   [phone] Len Childs)

## 2020-01-28 NOTE — Assessment & Plan Note (Addendum)
Possible exposure to STD. Asymptomatic currently. -RPR, HIV, GC/Ch and trichomoniasis collected today. Will call patient with results.

## 2020-01-29 ENCOUNTER — Telehealth: Payer: Self-pay | Admitting: Family Medicine

## 2020-01-29 LAB — CERVICOVAGINAL ANCILLARY ONLY
Bacterial Vaginitis (gardnerella): POSITIVE — AB
Candida Glabrata: NEGATIVE
Candida Vaginitis: NEGATIVE
Chlamydia: NEGATIVE
Comment: NEGATIVE
Comment: NEGATIVE
Comment: NEGATIVE
Comment: NEGATIVE
Comment: NEGATIVE
Comment: NORMAL
Neisseria Gonorrhea: NEGATIVE
Trichomonas: NEGATIVE

## 2020-01-29 LAB — HIV ANTIBODY (ROUTINE TESTING W REFLEX): HIV Screen 4th Generation wRfx: NONREACTIVE

## 2020-01-29 LAB — RPR: RPR Ser Ql: NONREACTIVE

## 2020-01-29 MED ORDER — METRONIDAZOLE 500 MG PO TABS: 500.0000 mg | ORAL_TABLET | Freq: Two times a day (BID) | ORAL | 0 refills | Status: DC

## 2020-01-29 NOTE — Telephone Encounter (Signed)
Called patient, unable to leave voicemail. Will send medication to pharmacy. Please inform patient that she has bacterial vaginosis. Please inform patient to not consume alcohol while taking this medication.  Mihaela Fajardo, DO

## 2020-01-30 NOTE — Telephone Encounter (Signed)
Spoke with pt informed her of the note below. Pt understood, and had no questions at this time. Aquilla Solian, CMA'

## 2020-02-04 ENCOUNTER — Ambulatory Visit: Payer: Medicare Other | Admitting: Family Medicine

## 2020-02-04 ENCOUNTER — Telehealth: Payer: Self-pay | Admitting: Family Medicine

## 2020-02-04 NOTE — Telephone Encounter (Signed)
Attempted to call patient twice at 9am to discuss anxiety follow-up. Will attempt again later today.

## 2020-02-04 NOTE — Telephone Encounter (Signed)
Will forward to MD. Ashira Kirsten,CMA  

## 2020-02-04 NOTE — Telephone Encounter (Signed)
Patient called to reschedule follow up appointment with Dr. Clayborne Artist. She said she prefers to see her after discussing certain things with her at her last office visit. Patient needs morning appointments due to work schedule but Dr. Clayborne Artist has none available.   Patient would like to know if she can call her to discuss. [phone].

## 2020-03-10 ENCOUNTER — Telehealth: Payer: Self-pay | Admitting: Family Medicine

## 2020-03-10 ENCOUNTER — Ambulatory Visit: Payer: Medicare Other | Admitting: Family Medicine

## 2020-03-10 ENCOUNTER — Telehealth: Payer: Self-pay

## 2020-03-10 MED ORDER — ONDANSETRON HCL 4 MG PO TABS: 4.0000 mg | ORAL_TABLET | Freq: Four times a day (QID) | ORAL | 0 refills | Status: DC | PRN

## 2020-03-10 NOTE — Telephone Encounter (Signed)
Patient calls nurse line reporting a positive covid test on 12/22 by [facility]. Patient is requesting to start the infusions. Please place referral and someone from the infusion center will contact patient.

## 2020-03-10 NOTE — Telephone Encounter (Signed)
Ms. Oatley called the after-hours line to discuss her Covid symptoms and to see if she should be assessed in the hospital tonight.  She notes that she was diagnosed with Covid 3 days ago and has been isolating at home.  She has been having multiple episodes of nausea with emesis, hot and cold chills and body aches.  She also notes chest pressure at rest without radiation to her arm, back, jaw.  She does not not notice worsened symptoms with exertion.  She is currently taking Tylenol 650 mg twice daily.  She reports she has urinated about 6 times today.  She was told as long she continues to breathe comfortably and demonstrate good urine output, she does not need to be seen or monitored in the hospital.  She was told that if her chest pain worsens and if she notices this type chest pain is exacerbated with exertion and she should be assessed in the hospital the same day.  She was instructed to increase her Tylenol to 1300 mg (2 pills of 650 mg) every 8 hours and is set an alarm on her phone so that she would take it regularly for the next 2 days.  Zofran was also sent to her pharmacy to help with nausea.  Chart review revealed that an email had been sent earlier today for her to be assessed for monoclonal antibody infusion.  She was reassured by our conversation and had no further questions.  Mirian Mo, MD

## 2020-03-10 NOTE — Telephone Encounter (Signed)
Email with name, DOB, symptom onset date, date pos test, risk factors sent to [email or web address].

## 2020-03-10 NOTE — Telephone Encounter (Signed)
Disregard referral.   Send a secure chat message to [facility] and they will reach out to patient. Make sure to attach patient to message.

## 2020-03-20 ENCOUNTER — Telehealth: Payer: Self-pay

## 2020-03-20 NOTE — Telephone Encounter (Signed)
Patient calls nurse line today requesting a letter from PCP stating she would benefit from working from home due to recent Covid diagnose and surge. Patient reports, "I caught Covid from this place and I am having anxiety being back." Patient states she has had two anxiety attacks today all ready being back. Patient reports she spoke with her HR representative and all they need is a letter from PCP backing up her anxiety claim, and HR should be able to accommodate her working from home. Please advise.

## 2020-03-21 NOTE — Telephone Encounter (Signed)
Unfortunately, while I sympathize with [name]'s situation, I am unable to provide such a letter as I have not seen/visited with [name] for over a year.  She would need to be evaluated at the [facility] before such a letter could be considered.   Epic shows she has been seen for health care more recently at [facility].  [name] may wish to contact them about this issue.

## 2020-03-24 NOTE — Telephone Encounter (Signed)
Spoke with pt briefly about message below. She stated that she was at work and would have to call back, but she knows that she has an appt with Dr. Perley Jain on the 27th of January. Aquilla Solian, CMA

## 2020-04-09 ENCOUNTER — Encounter: Payer: Self-pay | Admitting: Family Medicine

## 2020-04-10 ENCOUNTER — Encounter: Payer: Self-pay | Admitting: Family Medicine

## 2020-04-10 ENCOUNTER — Other Ambulatory Visit: Payer: Self-pay

## 2020-04-10 ENCOUNTER — Ambulatory Visit (INDEPENDENT_AMBULATORY_CARE_PROVIDER_SITE_OTHER): Payer: Medicare Other | Admitting: Family Medicine

## 2020-04-10 VITALS — BP 110/70 | HR 64 | Ht 66.0 in | Wt 164.0 lb

## 2020-04-10 DIAGNOSIS — F39 Unspecified mood [affective] disorder: Secondary | ICD-10-CM | POA: Diagnosis not present

## 2020-04-10 DIAGNOSIS — M47816 Spondylosis without myelopathy or radiculopathy, lumbar region: Secondary | ICD-10-CM | POA: Diagnosis not present

## 2020-04-10 DIAGNOSIS — M5136 Other intervertebral disc degeneration, lumbar region: Secondary | ICD-10-CM | POA: Diagnosis not present

## 2020-04-10 DIAGNOSIS — M545 Low back pain, unspecified: Secondary | ICD-10-CM | POA: Diagnosis not present

## 2020-04-10 DIAGNOSIS — M47817 Spondylosis without myelopathy or radiculopathy, lumbosacral region: Secondary | ICD-10-CM

## 2020-04-10 DIAGNOSIS — N951 Menopausal and female climacteric states: Secondary | ICD-10-CM

## 2020-04-10 DIAGNOSIS — M67432 Ganglion, left wrist: Secondary | ICD-10-CM | POA: Diagnosis not present

## 2020-04-10 DIAGNOSIS — M51369 Other intervertebral disc degeneration, lumbar region without mention of lumbar back pain or lower extremity pain: Secondary | ICD-10-CM

## 2020-04-10 MED ORDER — PAROXETINE HCL 10 MG PO TABS: 10.0000 mg | ORAL_TABLET | Freq: Every day | ORAL | 2 refills | Status: DC

## 2020-04-10 NOTE — Patient Instructions (Signed)
Start Paroxetine, one tablet each day, to help reduce your hot flashes.  It will not stop all hot flashes, but it should decrease how frequently they happen.  It takes 2 to 4 weeks to reach it best effect.     Therapy and Counseling Resources Most providers on this list will take Medicaid. Patients with commercial insurance or Medicare should contact their insurance company to get a list of in network providers.  [facility] [address] [place], [place] [address] [phone]  [facility]  [address] [place], [place] [address]      [phone]  [facility] [address]  [place], [place] [address] [phone]  [facility] [address], [address]  [place], [place] [address]       [phone]      Jovita Kussmaul [facility] [address] [place], [place] [phone]  [facility]:  [address] [place] [phone]  [facility]:  [address] [place]ie Diener [phone]  [facility] (under & uninsured) [address]   [place] [phone]    [email or web address]    [facility] [address] . [place]    [phone]  [facility] -[address]     Phone:  [phone]     [facility]-  [address]  [phone]   [facility] [address]      [place], [place] [phone]  [facility]: [address] [place], [place], [place]  [phone]   [facility] (Spanish therapist) [email or web address]  [address] [place]  [address]   [place] [address]    [phone]    [facility]   [address],  [place], [place]  [phone]   [facility]  [address] [place]  [phone]  [facility]  [address] [place], [place]        [phone]  Open Access/Walk In Clinic under & uninsured  [facility]  [address] [place], [place] [phone] Crisis [phone]  [facility],  (Spanish)   [address] [place], [place]: [phone]) 8:30 - 12; 1 - 2:30  [facility] [employer],  [address] [place], [place]    [phone]):8:30 - 12; 2 - 3PM  [facility] [employer],  [address]  [place]; [phone]):   Mon - Fri 8 AM - 5 PM  [facility] [address] [place]  MWF 12:30 to 3:00 or call to schedule an appointment  [phone]  Specific Provider options Psychology Today  [email or web address].psychologytoday.com/us 1. click on find a therapist  2. enter your zip code 3. left side and select or tailor a therapist for your specific need.   [facility] Provider Directory [email or web address]  (Medicaid)   Follow all drop down to find a provider  Social Support program [facility] [place] [phone] or [employer] [address], [place], [place] Recovery support and educational   24- Hour Availability:  .  Marland Kitchen [facility]  . [address] [place], [place] [employer] [phone] Crisis [phone]  . [facility] [phone]  [facility]  [phone]   . [facility]  [employer]  [phone] (after hours)  . Therapeutic Alternative/Mobile Crisis   [phone]  . [place] National Suicide Hotline  [phone] (TALK)  . Call 911 or go to emergency room  . [employer]  [phone]);  [place] and [employer]   . [facility]  [phone]); [place], [place], [place], [place], [place], [place], [place]

## 2020-04-11 DIAGNOSIS — M67432 Ganglion, left wrist: Secondary | ICD-10-CM | POA: Insufficient documentation

## 2020-04-11 DIAGNOSIS — N951 Menopausal and female climacteric states: Secondary | ICD-10-CM | POA: Insufficient documentation

## 2020-04-11 DIAGNOSIS — F39 Unspecified mood [affective] disorder: Secondary | ICD-10-CM | POA: Insufficient documentation

## 2020-04-11 HISTORY — DX: Menopausal and female climacteric states: N95.1

## 2020-04-11 NOTE — Assessment & Plan Note (Signed)
Established problem Uncontrolled pain that impairs her QOL [facility] requesting referral for possible repeat facet joint injections.   Referral placed to [facility] PM[facility] for evaluation and treatment

## 2020-04-11 NOTE — Assessment & Plan Note (Signed)
New problem Intermittently painful ~1.5 x 1 cm SQ mobile cysts left volar medial wrist.   RTC 2 weeks, aspiration attempt

## 2020-04-11 NOTE — Assessment & Plan Note (Signed)
Recurrent Problem, uncontrolled (+) smoker Trial of Paxil 10 mg qhs RTC 2 wks

## 2020-04-11 NOTE — Assessment & Plan Note (Signed)
Recurrent Problem, active, uncontrolled. PHQ9 SCORE ONLY 04/10/2020 01/28/2020 02/21/2018  PHQ-9 Total Score 8 9 0  No thoughts of self Harm - Has her children Divorce in May was stressful and continues to be a source of stress Feels isolated, loss of friends, only has her children who are now are teens - some interpersonal strife.  Episodes of sudden feeling of fear No obvious cause Episodes bother her a lot. Has multiple somatic symptoms: SHOB, palp, nausea, tingling.  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 Financial worries now that she has quit her job.  She feels she is failing and becoming a "statistic" because of her returning to disability income.    On no medications. (+) Alcohol use disorder in past  No period in over 2 years - lots of hot flashes that are very disruptive to life.  Prior diagnoses of Bipolar Type 1, and schizoaffective disorder.  [facility] at [facility] in past - did not like having to see different providers often who would change the last providers prescriptions.   She believes she would most benefit from counseling at this time but also believes it would be good to have a psychiatrist evaluation and treatment.   A/  Mood Disorder Adjustment disorder with depressed mood P/ Referral information about counseling and psychiatry services in area.  Discussed contacting us or ED should she have thoughts of self harm.  F/U in 2 weeks to see how she is and how she has progressed in establishing therapeutic relationships

## 2020-04-11 NOTE — Addendum Note (Signed)
Addended by: Acquanetta Belling D on: 04/11/2020 02:46 PM   Modules accepted: Orders

## 2020-04-17 ENCOUNTER — Encounter: Payer: Self-pay | Admitting: Physical Medicine & Rehabilitation

## 2020-04-24 ENCOUNTER — Ambulatory Visit: Payer: Medicare Other | Admitting: Family Medicine

## 2020-04-24 NOTE — Progress Notes (Deleted)
[name] is {Pc accompanied by:5710} Sources of clinical information for visit is/are {Information source:60032}. Nursing assessment for this office visit was reviewed with the patient for accuracy and revision.     Previous Report(s) Reviewed: {Outside review:15817}  Depression screen PHQ 2/9 04/10/2020  Decreased Interest 2  Down, Depressed, Hopeless 1  PHQ - 2 Score 3  Altered sleeping 1  Tired, decreased energy 1  Change in appetite 0  Feeling bad or failure about yourself  1  Trouble concentrating 2  Moving slowly or fidgety/restless 0  Suicidal thoughts 0  PHQ-9 Score 8  Difficult doing work/chores -  Some recent data might be hidden    Fall Risk  09/23/2016 07/13/2016 03/19/2016 09/03/2015 07/31/2015  Falls in the past year? No No No No No  Risk for fall due to : - - - - Other (Comment)    PHQ9 SCORE ONLY 04/10/2020 01/28/2020 02/21/2018  PHQ-9 Total Score 8 9 0    Adult vaccines due  Topic Date Due  . TETANUS/TDAP  11/29/2017    Health Maintenance Due  Topic Date Due  . TETANUS/TDAP  11/29/2017      History/P.E. limitations: {exam; limitations ed:60112::"none"}  Adult vaccines due  Topic Date Due  . TETANUS/TDAP  11/29/2017   There are no preventive care reminders to display for this patient.  Health Maintenance Due  Topic Date Due  . TETANUS/TDAP  11/29/2017     No chief complaint on file.

## 2020-04-25 DIAGNOSIS — Z79899 Other long term (current) drug therapy: Secondary | ICD-10-CM | POA: Diagnosis not present

## 2020-05-19 ENCOUNTER — Other Ambulatory Visit: Payer: Self-pay | Admitting: Family Medicine

## 2020-05-19 DIAGNOSIS — J4520 Mild intermittent asthma, uncomplicated: Secondary | ICD-10-CM

## 2020-05-20 ENCOUNTER — Encounter: Payer: Medicare Other | Admitting: Physical Medicine & Rehabilitation

## 2020-05-20 ENCOUNTER — Encounter: Payer: Self-pay | Admitting: Physical Medicine & Rehabilitation

## 2020-06-11 ENCOUNTER — Other Ambulatory Visit: Payer: Self-pay | Admitting: Family Medicine

## 2020-06-11 DIAGNOSIS — J4520 Mild intermittent asthma, uncomplicated: Secondary | ICD-10-CM

## 2020-07-07 ENCOUNTER — Ambulatory Visit: Payer: Medicare Other | Admitting: Physical Medicine & Rehabilitation

## 2020-07-08 ENCOUNTER — Telehealth: Payer: Self-pay

## 2020-07-08 NOTE — Telephone Encounter (Signed)
Attempted to call patient. No answer. Left HIPAA compliant VM for patient to return call to office to schedule appointment for toradol injection.   Veronda Prude, RN

## 2020-07-08 NOTE — Telephone Encounter (Signed)
Patient calls nurse line requesting to schedule appointment for toradol injection. Patient was last seen in office on 04/10/20 with PCP. Patient does not have new patient appointment with [facility] until 5/19.   Please advise if patient can be given toradol injection in nurse clinic or if you would like her to schedule appointment with provider.   Veronda Prude, RN

## 2020-07-08 NOTE — Telephone Encounter (Signed)
[name] may have a 30 mg Toradol intramuscular injection given at the [facility].

## 2020-07-08 NOTE — Telephone Encounter (Signed)
Routed to Nurse. Aquilla Solian, CMA

## 2020-07-09 NOTE — Telephone Encounter (Signed)
Spoke with pt made nurse visit for shot 07/10/20 at 9:00am. Aquilla Solian, CMA

## 2020-07-10 ENCOUNTER — Ambulatory Visit: Payer: Medicare Other

## 2020-07-15 ENCOUNTER — Ambulatory Visit: Payer: Medicare Other

## 2020-07-31 ENCOUNTER — Other Ambulatory Visit: Payer: Self-pay | Admitting: *Deleted

## 2020-07-31 ENCOUNTER — Other Ambulatory Visit: Payer: Self-pay | Admitting: Family Medicine

## 2020-07-31 ENCOUNTER — Encounter: Payer: Medicare Other | Attending: Physical Medicine & Rehabilitation | Admitting: Physical Medicine & Rehabilitation

## 2020-07-31 ENCOUNTER — Encounter: Payer: Self-pay | Admitting: Physical Medicine & Rehabilitation

## 2020-07-31 ENCOUNTER — Other Ambulatory Visit: Payer: Self-pay

## 2020-07-31 VITALS — BP 135/92 | HR 90 | Temp 98.3°F | Ht 66.0 in | Wt 161.0 lb

## 2020-07-31 DIAGNOSIS — M259 Joint disorder, unspecified: Secondary | ICD-10-CM | POA: Diagnosis not present

## 2020-07-31 DIAGNOSIS — M47816 Spondylosis without myelopathy or radiculopathy, lumbar region: Secondary | ICD-10-CM | POA: Insufficient documentation

## 2020-07-31 DIAGNOSIS — M5136 Other intervertebral disc degeneration, lumbar region: Secondary | ICD-10-CM

## 2020-07-31 DIAGNOSIS — J4520 Mild intermittent asthma, uncomplicated: Secondary | ICD-10-CM

## 2020-07-31 MED ORDER — MELOXICAM 7.5 MG PO TABS: 7.5000 mg | ORAL_TABLET | Freq: Every day | ORAL | 2 refills | Status: DC

## 2020-07-31 MED ORDER — GABAPENTIN 600 MG PO TABS: 600.0000 mg | ORAL_TABLET | Freq: Three times a day (TID) | ORAL | 2 refills | Status: DC

## 2020-07-31 MED ORDER — DIAZEPAM 10 MG PO TABS: ORAL_TABLET | ORAL | 0 refills | Status: DC

## 2020-07-31 NOTE — Patient Instructions (Addendum)
Will need test blocks x 2 prior to repeat RF
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
## 2020-08-21 LAB — HM PAP SMEAR

## 2020-09-04 ENCOUNTER — Telehealth: Payer: Self-pay | Admitting: Physical Medicine & Rehabilitation

## 2020-09-04 NOTE — Telephone Encounter (Addendum)
It looks like one was sent to her [facility] on 07/31/20 by Dr Wynn Banker with DNF before 09/04/20 and her procedure is 09/09/20. I have let her know.

## 2020-09-04 NOTE — Telephone Encounter (Signed)
Please call in anti anxiety meds for upcoming procedure please

## 2020-09-08 ENCOUNTER — Telehealth: Payer: Self-pay | Admitting: Physical Medicine & Rehabilitation

## 2020-09-08 NOTE — Telephone Encounter (Signed)
[name] / [name] [phone] called office and asked why this procedure was denied and what correct procedure codes were - I advised [id number] and denial stated no trial of treatment was denial reason.,  He asked what that meant....Marland KitchenMarland KitchenI told [name] that this is a new patient to [place] in May - the historian of her past history for procedures was patient - we have none on file other than her conversation.  Yes our provider will do whatever pre req's are needed if [name] is not recognizing any past medical history prior to our office visit - he will contact patient to find out where past medical treatment took place - attempt to get copies of history and see if [name] will reconsider or if AK has to start from scratch. lk

## 2020-09-09 ENCOUNTER — Other Ambulatory Visit: Payer: Self-pay

## 2020-09-09 ENCOUNTER — Encounter: Payer: Self-pay | Admitting: Physical Medicine & Rehabilitation

## 2020-09-09 ENCOUNTER — Encounter
Payer: No Typology Code available for payment source | Attending: Physical Medicine & Rehabilitation | Admitting: Physical Medicine & Rehabilitation

## 2020-09-09 VITALS — BP 129/92 | HR 81 | Temp 98.9°F | Ht 65.0 in | Wt 159.0 lb

## 2020-09-09 DIAGNOSIS — M47816 Spondylosis without myelopathy or radiculopathy, lumbar region: Secondary | ICD-10-CM | POA: Diagnosis present

## 2020-09-09 MED ORDER — KETOROLAC TROMETHAMINE 60 MG/2ML IM SOLN
60.0000 mg | Freq: Once | INTRAMUSCULAR | Status: AC
  Administered 2020-09-09: 60 mg via INTRAMUSCULAR
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
## 2020-09-09 NOTE — Patient Instructions (Signed)
Back Exercises These exercises help to make your trunk and back strong. They also help to keep the lower back flexible. Doing these exercises can help to prevent back pain or lessen existing pain. If you have back pain, try to do these exercises 2-3 times each day or as told by your doctor. As you get better, do the exercises once each day. Repeat the exercises more often as told by your doctor. To stop back pain from coming back, do the exercises once each day, or as told by your doctor. Exercises Single knee to chest Do these steps 3-5 times in a row for each leg: Lie on your back on a firm bed or the floor with your legs stretched out. Bring one knee to your chest. Grab your knee or thigh with both hands and hold them it in place. Pull on your knee until you feel a gentle stretch in your lower back or buttocks. Keep doing the stretch for 10-30 seconds. Slowly let go of your leg and straighten it. Pelvic tilt Do these steps 5-10 times in a row: Lie on your back on a firm bed or the floor with your legs stretched out. Bend your knees so they point up to the ceiling. Your feet should be flat on the floor. Tighten your lower belly (abdomen) muscles to press your lower back against the floor. This will make your tailbone point up to the ceiling instead of pointing down to your feet or the floor. Stay in this position for 5-10 seconds while you gently tighten your muscles and breathe evenly. Cat-cow Do these steps until your lower back bends more easily: Get on your hands and knees on a firm surface. Keep your hands under your shoulders, and keep your knees under your hips. You may put padding under your knees. Let your head hang down toward your chest. Tighten (contract) the muscles in your belly. Point your tailbone toward the floor so your lower back becomes rounded like the back of a cat. Stay in this position for 5 seconds. Slowly lift your head. Let the muscles of your belly relax. Point  your tailbone up toward the ceiling so your back forms a sagging arch like the back of a cow. Stay in this position for 5 seconds.  Press-ups Do these steps 5-10 times in a row: Lie on your belly (face-down) on the floor. Place your hands near your head, about shoulder-width apart. While you keep your back relaxed and keep your hips on the floor, slowly straighten your arms to raise the top half of your body and lift your shoulders. Do not use your back muscles. You may change where you place your hands in order to make yourself more comfortable. Stay in this position for 5 seconds. Slowly return to lying flat on the floor.  Bridges Do these steps 10 times in a row: Lie on your back on a firm surface. Bend your knees so they point up to the ceiling. Your feet should be flat on the floor. Your arms should be flat at your sides, next to your body. Tighten your butt muscles and lift your butt off the floor until your waist is almost as high as your knees. If you do not feel the muscles working in your butt and the back of your thighs, slide your feet 1-2 inches farther away from your butt. Stay in this position for 3-5 seconds. Slowly lower your butt to the floor, and let your butt muscles relax. If   this exercise is too easy, try doing it with your arms crossed over yourchest. Belly crunches Do these steps 5-10 times in a row: Lie on your back on a firm bed or the floor with your legs stretched out. Bend your knees so they point up to the ceiling. Your feet should be flat on the floor. Cross your arms over your chest. Tip your chin a little bit toward your chest but do not bend your neck. Tighten your belly muscles and slowly raise your chest just enough to lift your shoulder blades a tiny bit off of the floor. Avoid raising your body higher than that, because it can put too much stress on your low back. Slowly lower your chest and your head to the floor. Back lifts Do these steps 5-10 times  in a row: Lie on your belly (face-down) with your arms at your sides, and rest your forehead on the floor. Tighten the muscles in your legs and your butt. Slowly lift your chest off of the floor while you keep your hips on the floor. Keep the back of your head in line with the curve in your back. Look at the floor while you do this. Stay in this position for 3-5 seconds. Slowly lower your chest and your face to the floor. Contact a doctor if: Your back pain gets a lot worse when you do an exercise. Your back pain does not get better 2 hours after you exercise. If you have any of these problems, stop doing the exercises. Do not do them again unless your doctor says it is okay. Get help right away if: You have sudden, very bad back pain. If this happens, stop doing the exercises. Do not do them again unless your doctor says it is okay. This information is not intended to replace advice given to you by your health care provider. Make sure you discuss any questions you have with your healthcare provider. Document Revised: 11/24/2017 Document Reviewed: 11/24/2017 Elsevier Patient Education  2022 Elsevier Inc.  

## 2020-10-13 ENCOUNTER — Emergency Department (HOSPITAL_COMMUNITY)
Admission: EM | Admit: 2020-10-13 | Discharge: 2020-10-14 | Disposition: A | Discharge status: Home / Self Care | Payer: Medicare Other | Attending: Emergency Medicine | Admitting: Emergency Medicine

## 2020-10-13 ENCOUNTER — Emergency Department (HOSPITAL_COMMUNITY): Payer: Medicare Other

## 2020-10-13 ENCOUNTER — Other Ambulatory Visit: Payer: Self-pay

## 2020-10-13 ENCOUNTER — Encounter (HOSPITAL_COMMUNITY): Payer: Self-pay

## 2020-10-13 DIAGNOSIS — I10 Essential (primary) hypertension: Secondary | ICD-10-CM | POA: Diagnosis not present

## 2020-10-13 DIAGNOSIS — J4520 Mild intermittent asthma, uncomplicated: Secondary | ICD-10-CM | POA: Insufficient documentation

## 2020-10-13 DIAGNOSIS — R0981 Nasal congestion: Secondary | ICD-10-CM | POA: Insufficient documentation

## 2020-10-13 DIAGNOSIS — F17210 Nicotine dependence, cigarettes, uncomplicated: Secondary | ICD-10-CM | POA: Diagnosis not present

## 2020-10-13 DIAGNOSIS — Z20822 Contact with and (suspected) exposure to covid-19: Secondary | ICD-10-CM | POA: Insufficient documentation

## 2020-10-13 DIAGNOSIS — R0602 Shortness of breath: Secondary | ICD-10-CM | POA: Diagnosis present

## 2020-10-13 DIAGNOSIS — Z79899 Other long term (current) drug therapy: Secondary | ICD-10-CM | POA: Diagnosis not present

## 2020-10-13 LAB — CBC WITH DIFFERENTIAL/PLATELET
Abs Immature Granulocytes: 0.03 10*3/uL (ref 0.00–0.07)
Basophils Absolute: 0.1 10*3/uL (ref 0.0–0.1)
Basophils Relative: 1 %
Eosinophils Absolute: 0.1 10*3/uL (ref 0.0–0.5)
Eosinophils Relative: 1 %
HCT: 41.3 % (ref 36.0–46.0)
Hemoglobin: 14.3 g/dL (ref 12.0–15.0)
Immature Granulocytes: 0 %
Lymphocytes Relative: 53 %
Lymphs Abs: 3.8 10*3/uL (ref 0.7–4.0)
MCH: 32.1 pg (ref 26.0–34.0)
MCHC: 34.6 g/dL (ref 30.0–36.0)
MCV: 92.8 fL (ref 80.0–100.0)
Monocytes Absolute: 0.5 10*3/uL (ref 0.1–1.0)
Monocytes Relative: 6 %
Neutro Abs: 2.9 10*3/uL (ref 1.7–7.7)
Neutrophils Relative %: 39 %
Platelets: 265 10*3/uL (ref 150–400)
RBC: 4.45 MIL/uL (ref 3.87–5.11)
RDW: 14.2 % (ref 11.5–15.5)
WBC: 7.3 10*3/uL (ref 4.0–10.5)
nRBC: 0 % (ref 0.0–0.2)

## 2020-10-13 LAB — BASIC METABOLIC PANEL
Anion gap: 9 (ref 5–15)
BUN: 5 mg/dL — ABNORMAL LOW (ref 6–20)
CO2: 29 mmol/L (ref 22–32)
Calcium: 9.6 mg/dL (ref 8.9–10.3)
Chloride: 101 mmol/L (ref 98–111)
Creatinine, Ser: 1.03 mg/dL — ABNORMAL HIGH (ref 0.44–1.00)
GFR, Estimated: >60 mL/min (ref >60)
Glucose, Bld: 81 mg/dL (ref 70–99)
Potassium: 3.5 mmol/L (ref 3.5–5.1)
Sodium: 139 mmol/L (ref 135–145)

## 2020-10-13 LAB — TROPONIN I (HIGH SENSITIVITY): Troponin I (High Sensitivity): 5 ng/L (ref <18)

## 2020-10-13 NOTE — ED Notes (Signed)
Pt upset about the wait time and that people have gone back that got here after her. Attempted to explain to the patient the process of the waiting room and the patient got upset and stated that she felt this NT was speaking negatively to her. Attempted to explain to the pt that this NT was only attempting to explain the process to her to get a better understanding and that we are doing all that we can to get her back as soon as possible. Pt still upset and walked away.

## 2020-10-13 NOTE — ED Triage Notes (Signed)
Pt reports worsening cough, SOB, chills, and CP when coughing since Friday. Pt states she took COVID test at home Friday which was neg.

## 2020-10-13 NOTE — ED Provider Notes (Signed)
Emergency Medicine Provider Triage Evaluation Note  [name] , a 40 y.o. female  was evaluated in triage.  Pt complains of chest pain shortness of breath, chills, cough x8 days.  Vaccinated for COVID but recently around ill church members.  Review of Systems  Positive: Chest pain cough, shortness of breath, chills, subjective fevers Negative: Nausea, vomiting, diarrhea  Physical Exam  BP (!) 126/100   Pulse 78   Temp 98.5 F (36.9 C) (Oral)   Resp 18   Ht 5\' 5"  (1.651 m)   Wt 72.6 kg   LMP 09/05/2016 Comment: tubal ligation  SpO2 100%   BMI 26.63 kg/m  Gen:   Awake, no distress   Resp:  Normal effort  MSK:   Moves extremities without difficulty  Other:  RRR no M/R/G.  Some decreased air movement in the bases bilaterally but no wheezing or rales.  Mild posterior pharyngeal erythema.  Medical Decision Making  Medically screening exam initiated at 6:38 PM.  Appropriate orders placed.  [name] was informed that the remainder of the evaluation will be completed by another provider, this initial triage assessment does not replace that evaluation, and the importance of remaining in the ED until their evaluation is complete.  This chart was dictated using voice recognition software, Dragon. Despite the best efforts of this provider to proofread and correct errors, errors may still occur which can change documentation meaning.    09/07/2016, PA-C 10/13/20 1844    12/13/20, DO 10/13/20 2315

## 2020-10-13 NOTE — ED Notes (Signed)
Pt aggravated by wait times in lobby, pt informed of process and reassured that she was not forgotten. Pt requesting to make complaint. Pt given the phone number for patient experience and charge RN made aware.

## 2020-10-14 ENCOUNTER — Other Ambulatory Visit: Payer: Self-pay

## 2020-10-14 ENCOUNTER — Telehealth: Payer: Self-pay | Admitting: Physical Medicine & Rehabilitation

## 2020-10-14 ENCOUNTER — Encounter: Payer: Self-pay | Admitting: Physical Medicine & Rehabilitation

## 2020-10-14 ENCOUNTER — Encounter: Payer: Medicaid Other | Attending: Physical Medicine & Rehabilitation | Admitting: Physical Medicine & Rehabilitation

## 2020-10-14 ENCOUNTER — Other Ambulatory Visit: Payer: Self-pay | Admitting: Family Medicine

## 2020-10-14 VITALS — BP 132/90 | HR 71 | Temp 98.6°F | Ht 66.0 in | Wt 156.4 lb

## 2020-10-14 DIAGNOSIS — J4520 Mild intermittent asthma, uncomplicated: Secondary | ICD-10-CM | POA: Diagnosis not present

## 2020-10-14 DIAGNOSIS — M47816 Spondylosis without myelopathy or radiculopathy, lumbar region: Secondary | ICD-10-CM

## 2020-10-14 LAB — TROPONIN I (HIGH SENSITIVITY): Troponin I (High Sensitivity): 5 ng/L (ref <18)

## 2020-10-14 LAB — RESP PANEL BY RT-PCR (FLU A&B, COVID) ARPGX2
Influenza A by PCR: NEGATIVE
Influenza B by PCR: NEGATIVE
SARS Coronavirus 2 by RT PCR: NEGATIVE

## 2020-10-14 MED ORDER — MELOXICAM 7.5 MG PO TABS: 7.5000 mg | ORAL_TABLET | Freq: Every day | ORAL | 2 refills | Status: DC

## 2020-10-14 MED ORDER — IPRATROPIUM-ALBUTEROL 0.5-2.5 (3) MG/3ML IN SOLN
3.0000 mL | Freq: Once | RESPIRATORY_TRACT | Status: AC
  Administered 2020-10-14: 3 mL via RESPIRATORY_TRACT
  Filled 2020-10-14: qty 3

## 2020-10-14 MED ORDER — GABAPENTIN 600 MG PO TABS: 600.0000 mg | ORAL_TABLET | Freq: Three times a day (TID) | ORAL | 2 refills | Status: DC

## 2020-10-14 MED ORDER — CYCLOBENZAPRINE HCL 10 MG PO TABS: 10.0000 mg | ORAL_TABLET | Freq: Every day | ORAL | 1 refills | Status: DC

## 2020-10-14 MED ORDER — ALBUTEROL SULFATE HFA 108 (90 BASE) MCG/ACT IN AERS
2.0000 | INHALATION_SPRAY | Freq: Once | RESPIRATORY_TRACT | Status: DC
  Filled 2020-10-14: qty 6.7

## 2020-10-14 NOTE — Telephone Encounter (Signed)
[name] called states her prescription of Meloxicam and Gabapentin needs to be sent to  [facility] on [address] and [address] instead of [facility]. Due to her insurance coverage.   Thank you
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
## 2020-10-14 NOTE — Discharge Instructions (Addendum)
Continue rescue inhales when needed.  Since you do not have neb machine, 8 puffs of inhaler is equal to neb treatment. Follow-up with your primary care doctor. Return here for new concerns.
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
## 2020-10-14 NOTE — Addendum Note (Signed)
Addended by: Erick Colace on: 10/14/2020 05:23 PM   Modules accepted: Orders

## 2020-10-14 NOTE — Patient Instructions (Signed)
Cont home exercises , take cyclobenzaprine at night

## 2020-10-14 NOTE — ED Notes (Signed)
Pt given inhaler to take home, discharge instructions reviewed. Pt stated understanding.

## 2020-10-15 ENCOUNTER — Encounter: Payer: Self-pay | Admitting: Family Medicine

## 2020-10-21 ENCOUNTER — Ambulatory Visit: Payer: No Typology Code available for payment source | Admitting: Physical Medicine & Rehabilitation

## 2020-10-22 ENCOUNTER — Telehealth: Payer: Self-pay

## 2020-10-22 NOTE — Telephone Encounter (Signed)
LVM to have pt call back to schedule AWV.   RE: confirm insurance and schedule AWV on my schedule if times are convenient for patient or other AWV schedule as template permits.    I was reviewing coverage for patient did not see a recent card scanned for Medicare. Will need to verify this information before scheduling.

## 2020-11-13 ENCOUNTER — Telehealth: Payer: Self-pay

## 2020-11-13 NOTE — Telephone Encounter (Signed)
Patient calls nurse line requesting DMV handicap placard application to be completed.   I will place form in PCP box for review and completion.   Veronda Prude, RN

## 2020-11-14 NOTE — Telephone Encounter (Signed)
Patient has appt on 9/15. Aquilla Solian, CMA

## 2020-11-14 NOTE — Telephone Encounter (Signed)
Please let Ms Dondlinger know she will need to be seen for an office visit to discuss certifying her as having a condition that necessitates a handicap parking placard.

## 2020-11-20 ENCOUNTER — Telehealth: Payer: Self-pay | Admitting: Family Medicine

## 2020-11-20 NOTE — Telephone Encounter (Signed)
Patient is dropping off an accomodation form and handicap placard for Dr. McDiarmid to fill out. LDOS 04-10-20  I have placed both forms in the blue team folder.

## 2020-11-26 NOTE — Telephone Encounter (Signed)
Form will be filled out by provider once patient makes appt. Aquilla Solian, CMA

## 2020-11-27 ENCOUNTER — Ambulatory Visit (INDEPENDENT_AMBULATORY_CARE_PROVIDER_SITE_OTHER): Payer: Medicaid Other | Admitting: Family Medicine

## 2020-11-27 ENCOUNTER — Encounter: Payer: Self-pay | Admitting: Family Medicine

## 2020-11-27 ENCOUNTER — Other Ambulatory Visit: Payer: Self-pay

## 2020-11-27 VITALS — BP 126/100 | HR 81 | Ht 66.0 in | Wt 155.4 lb

## 2020-11-27 DIAGNOSIS — F39 Unspecified mood [affective] disorder: Secondary | ICD-10-CM | POA: Diagnosis not present

## 2020-11-27 DIAGNOSIS — M6740 Ganglion, unspecified site: Secondary | ICD-10-CM

## 2020-11-27 DIAGNOSIS — M67432 Ganglion, left wrist: Secondary | ICD-10-CM | POA: Diagnosis not present

## 2020-11-27 NOTE — Patient Instructions (Signed)
Psychiatry Resource List (Adults and Children)   Most of these providers will take Medicaid. please consult your insurance for a complete and updated list of available providers. When calling to make an appointment have your insurance information available to confirm you are covered.   [facility] [address] [place] [phone]  [facility]:    [place]: [address]     [phone]  [place]ney Ace: [address] [place]. [place],        [phone]  [place]: [address],    [phone]  Kathryne Sharper: [address],                   [phone]   [facility]  (Psychiatry & counseling ; adults & children ; will take Medicaid)  [address], [place], [place]        [phone]    [facility]  (Psychiatry only; Adults only, will take Medicaid)  [address], [place], [place] [address]       [phone]    [facility] (Psychiatry & counseling ; adults & children ; will take Medicaid  [address]  [place] [address]    [phone]    (Spanish therapist)   [facility]  Psychiatry & counseling; Adults and children;  [address]    [place], [place] [address]    [phone]   [facility] (Psychiatry & counseling; adults & children; Medicare no Medicaid)  [address]   [place], [place]  [address]      [phone]     [facility] (up to age 74)  Psychiatry & counseling ,will take Medicaid, must do counseling to receive psychiatry services  [address] [place] [address]        [phone]   [facility] (Psychiatry & counseling; adults & children; will take Medicaid)  [address],  [place], [place]  [phone]   [facility]---  Walk-in Mon-Fri, 8:30-5:00 (will take Medicaid)  [address] [place], [place]  [phone]    [facility] --- Walk-In Mon-Friday 8am-3pm ( will take Medicaid, Psychiatry, Adults & children,  [address], [place], [place]   [phone]    [facility]--, Walk-in M-F 8am-12pm and 1pm -3pm    (Counseling, Psychiatry, will take Medicaid, adults & children)   [address] [place], [place]  [phone]

## 2020-11-28 ENCOUNTER — Encounter: Payer: Self-pay | Admitting: Family Medicine

## 2020-11-28 NOTE — Assessment & Plan Note (Signed)
Established problem worsened.  The wrist cysts has increased in diameter and is now associated with pain into forearm dorsum.  Plan Referral to orthopedics for evaluation and treatment

## 2020-11-28 NOTE — Progress Notes (Signed)
[name] is alone Sources of clinical information for visit is/are patient and past medical records. Nursing assessment for this office visit was reviewed with the patient for accuracy and revision.     Previous Report(s) Reviewed: none  Depression screen PHQ 2/9 11/27/2020  Decreased Interest 3  Down, Depressed, Hopeless 3  PHQ - 2 Score 6  Altered sleeping 1  Tired, decreased energy 1  Change in appetite 1  Feeling bad or failure about yourself  1  Trouble concentrating 2  Moving slowly or fidgety/restless 1  Suicidal thoughts 1  PHQ-9 Score 14  Difficult doing work/chores Extremely dIfficult  Some recent data might be hidden    Fall Risk  10/14/2020 09/09/2020 07/31/2020 09/23/2016 07/13/2016  Falls in the past year? 1 0 0 No No  Comment Last fall in June 2022. No major injury. - - - -  Number falls in past yr: 1 - 0 - -  Injury with Fall? 0 - 0 - -  Risk for fall due to : - - - - -    PHQ9 SCORE ONLY 11/27/2020 07/31/2020 04/10/2020  PHQ-9 Total Score 14 5 8     Adult vaccines due  Topic Date Due   TETANUS/TDAP  11/29/2017    Health Maintenance Due  Topic Date Due   Pneumococcal Vaccine 16-52 Years old (2 - PCV) 03/16/2003   TETANUS/TDAP  11/29/2017   COVID-19 Vaccine (3 - Moderna risk series) 01/11/2020   INFLUENZA VACCINE  10/13/2020   PAP SMEAR-Modifier  01/26/2021      History/P.E. limitations: none  Adult vaccines due  Topic Date Due   TETANUS/TDAP  11/29/2017   There are no preventive care reminders to display for this patient.  Health Maintenance Due  Topic Date Due   Pneumococcal Vaccine 38-13 Years old (2 - PCV) 03/16/2003   TETANUS/TDAP  11/29/2017   COVID-19 Vaccine (3 - Moderna risk series) 01/11/2020   INFLUENZA VACCINE  10/13/2020   PAP SMEAR-Modifier  01/26/2021     Chief Complaint  Patient presents with   Wrist Pain    Left   Stress
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
## 2020-12-03 ENCOUNTER — Other Ambulatory Visit: Payer: Self-pay

## 2020-12-03 ENCOUNTER — Ambulatory Visit: Payer: Self-pay

## 2020-12-03 ENCOUNTER — Ambulatory Visit (INDEPENDENT_AMBULATORY_CARE_PROVIDER_SITE_OTHER): Payer: Medicare Other | Admitting: Orthopaedic Surgery

## 2020-12-03 DIAGNOSIS — M67432 Ganglion, left wrist: Secondary | ICD-10-CM
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
## 2020-12-05 ENCOUNTER — Other Ambulatory Visit: Payer: Self-pay | Admitting: Family Medicine

## 2020-12-08 NOTE — Telephone Encounter (Signed)
Pt contacted and she does not have medicare at this time.

## 2020-12-17 ENCOUNTER — Ambulatory Visit
Admission: RE | Admit: 2020-12-17 | Discharge: 2020-12-17 | Disposition: A | Discharge status: Home / Self Care | Payer: Medicaid Other | Source: Ambulatory Visit | Attending: Orthopaedic Surgery | Admitting: Orthopaedic Surgery

## 2020-12-17 DIAGNOSIS — M67432 Ganglion, left wrist: Secondary | ICD-10-CM

## 2020-12-20 ENCOUNTER — Other Ambulatory Visit: Payer: Medicaid Other

## 2020-12-23 ENCOUNTER — Other Ambulatory Visit: Payer: Self-pay

## 2020-12-23 ENCOUNTER — Telehealth: Payer: Self-pay | Admitting: *Deleted

## 2020-12-23 ENCOUNTER — Ambulatory Visit (INDEPENDENT_AMBULATORY_CARE_PROVIDER_SITE_OTHER): Payer: Medicare Other | Admitting: Orthopaedic Surgery

## 2020-12-23 ENCOUNTER — Encounter: Payer: Self-pay | Admitting: Orthopaedic Surgery

## 2020-12-23 DIAGNOSIS — M67432 Ganglion, left wrist: Secondary | ICD-10-CM

## 2020-12-23 MED ORDER — DIAZEPAM 10 MG PO TABS: ORAL_TABLET | ORAL | 0 refills | Status: DC

## 2020-12-23 NOTE — Telephone Encounter (Signed)
Ms Kirkendoll called for a pre med for her injection on Thursday. I have called Diazepam to her pharmacy to take one tablet 30 min prior to procedure and will have a driver.
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
## 2020-12-25 ENCOUNTER — Encounter: Payer: Medicare Other | Attending: Physical Medicine & Rehabilitation | Admitting: Physical Medicine & Rehabilitation

## 2020-12-25 ENCOUNTER — Encounter: Payer: Self-pay | Admitting: Physical Medicine & Rehabilitation

## 2020-12-25 ENCOUNTER — Other Ambulatory Visit: Payer: Self-pay

## 2020-12-25 ENCOUNTER — Telehealth: Payer: Self-pay | Admitting: Orthopaedic Surgery

## 2020-12-25 VITALS — BP 150/99 | HR 61 | Temp 98.8°F | Ht 66.0 in | Wt 157.0 lb

## 2020-12-25 DIAGNOSIS — M47816 Spondylosis without myelopathy or radiculopathy, lumbar region: Secondary | ICD-10-CM | POA: Diagnosis not present

## 2020-12-25 NOTE — Progress Notes (Signed)
Left Lumbar L3, L4  medial branch blocks and L 5 dorsal ramus injection under fluoroscopic guidance   Indication: Left Lumbar pain which is not relieved by medication management or other conservative care and interfering with self-care and mobility.  Informed consent was obtained after describing risks and benefits of the procedure with the patient, this includes bleeding, bruising, infection, paralysis and medication side effects.  The patient wishes to proceed and has given written consent.  The patient was placed in a prone position.  The lumbar area was marked and prepped with Betadine.  One mL of 1% lidocaine was injected into each of 3 areas into the skin and subcutaneous tissue.  Then a 22-gauge 3" spinal needle was inserted targeting the junction of the left S1 superior articular process and sacral ala junction.  Needle was advanced under fluoroscopic guidance.  Bone contact was made.Declined views obrained, Omnipaque 1800 was injected x 0.5 mL demonstrating no intravascular uptake.  Then a solution containing  2% MPF lidocaine was injected x 0.5 mL.  Then the left L5 superior articular process in transverse process junction was targeted.  Bone contact was made. Omnipaque 1800 was injected x 0.5 mL demonstrating no intravascular uptake.  Then a solution containing 2% MPF lidocaine was injected x 0.5 mL.  Then the left L4 superior articular process in transverse process junction was targeted.  Bone contact was made. Omnipaque 180was injected x 0.5 mL demonstrating no intravascular uptake.  Then a solution containing  2% MPF lidocaine was injected x 0.5 mL.  Patient tolerated procedure well.  Post procedure instructions were given.

## 2020-12-25 NOTE — Patient Instructions (Signed)

## 2020-12-25 NOTE — Telephone Encounter (Signed)
Received call from patient. She has fmla forms as she has surgery scheduled next week. She expressed she is unable to pay [employer]'s form fee as she only has flex pay card. I told her to bring forms in and the office would complete. She will have her mom bring in forms and leave for me.

## 2020-12-25 NOTE — Progress Notes (Signed)
  PROCEDURE RECORD [facility]   Name: [name] DOB:01-12-1981 MRN: [id number]  Date:12/25/2020  Physician: Claudette Laws, MD    Nurse/CMA: Nedra Hai, CMA  Allergies:  Allergies  Allergen Reactions   Azelastine Anaphylaxis   Azelastine Hcl Other (See Comments)    Headache Other reaction(s): Other (See Comments) Headache    Consent Signed: Yes.    Is patient diabetic? No.  CBG today? N/a  Pregnant: No. LMP: Patient's last menstrual period was 09/05/2016. (age 53-55)  Anticoagulants: no Anti-inflammatory: no Antibiotics: no  Procedure: Bilateral L3-4-5 Medial Branch Block   Position: Prone Start Time: 10:04 am  End Time: 10:20 am  Fluoro Time: 1:11 RN/CMA Silva Aamodt MA Lee,CMA    Time 9:14 am 10:29 am    BP 150/99 150/106    Pulse 61 60    Respirations 16 16    O2 Sat 98 99    S/S 6 6    Pain Level 6/10 2/10     D/C home with Mother, patient A & O X 3, D/C instructions reviewed, and sits independently.

## 2020-12-29 ENCOUNTER — Other Ambulatory Visit: Payer: Self-pay | Admitting: Physician Assistant

## 2020-12-29 MED ORDER — ONDANSETRON HCL 4 MG PO TABS: 4.0000 mg | ORAL_TABLET | Freq: Three times a day (TID) | ORAL | 0 refills | Status: DC | PRN

## 2020-12-29 MED ORDER — HYDROCODONE-ACETAMINOPHEN 5-325 MG PO TABS: 1.0000 | ORAL_TABLET | Freq: Three times a day (TID) | ORAL | 0 refills | Status: DC | PRN

## 2021-01-01 ENCOUNTER — Ambulatory Visit: Payer: Medicaid Other | Admitting: Family Medicine

## 2021-01-01 ENCOUNTER — Other Ambulatory Visit: Payer: Self-pay

## 2021-01-01 DIAGNOSIS — M67432 Ganglion, left wrist: Secondary | ICD-10-CM

## 2021-01-02 ENCOUNTER — Telehealth: Payer: Self-pay | Admitting: Orthopaedic Surgery

## 2021-01-02 NOTE — Telephone Encounter (Signed)
Pt states hydrocodone is doing nothing for her pain. Wondering can she get something stronger?   CB [phone]

## 2021-01-05 ENCOUNTER — Other Ambulatory Visit: Payer: Self-pay | Admitting: Physician Assistant

## 2021-01-05 ENCOUNTER — Telehealth: Payer: Self-pay | Admitting: Physician Assistant

## 2021-01-05 MED ORDER — OXYCODONE-ACETAMINOPHEN 5-325 MG PO TABS: 1.0000 | ORAL_TABLET | Freq: Four times a day (QID) | ORAL | 0 refills | Status: DC | PRN

## 2021-01-05 NOTE — Telephone Encounter (Signed)
Sent in percocet

## 2021-01-05 NOTE — Telephone Encounter (Signed)
Pt called asking for PA Stanbery to send pre authorization to pharmacy to release her pain medication. Please call pt at when sent at [phone]. 

## 2021-01-05 NOTE — Telephone Encounter (Signed)
I am unable to look at previous surgical center schedule, but did we just operate on her?  I cannot remember?

## 2021-01-05 NOTE — Telephone Encounter (Signed)
Patient aware.

## 2021-01-05 NOTE — Telephone Encounter (Signed)
01/01/21- PRE-OPERATIVE DIAGNOSES: Ganglion of tendon sheath Wrist; DYNX(G33.582)

## 2021-01-06 ENCOUNTER — Telehealth: Payer: Self-pay | Admitting: Orthopaedic Surgery

## 2021-01-06 NOTE — Telephone Encounter (Signed)
Header Information Confirmation [id number] [facility] Plan:MCAIDHealth Plan:[id number] Prior Approval [id number] PA Type:PHARMACYRecipient:Shirley BOLERRecipient JQ:[id number] PBilling Provider:Billing Provider QR:[id number] Provider Name:NAIPING [name] Provider IY:[id number] Submission Date:10/25/2022Status:APPROVEDEffective Begin Date:10/25/2022Effective End Date:04/23/2023Payer:[place] [employer] OF HEALTH BENEFITS# of Attachments:0

## 2021-01-06 NOTE — Telephone Encounter (Signed)
PA done pending approval   [id number] [facility] [id number] P Aleksa Lezama 01/06/2021 SUSPENDED  DHB

## 2021-01-06 NOTE — Telephone Encounter (Signed)
Pt called asking for PA Stanbery to send pre authorization to pharmacy to release her pain medication. Please call pt at when sent at [phone].

## 2021-01-06 NOTE — Telephone Encounter (Signed)
Duplicate msg.

## 2021-01-06 NOTE — Telephone Encounter (Signed)
Confirmation #:[id number] WPrior Approval #:Status:SUSPENDED

## 2021-01-08 ENCOUNTER — Encounter: Payer: Self-pay | Admitting: Orthopaedic Surgery

## 2021-01-08 ENCOUNTER — Other Ambulatory Visit: Payer: Self-pay

## 2021-01-08 ENCOUNTER — Ambulatory Visit (INDEPENDENT_AMBULATORY_CARE_PROVIDER_SITE_OTHER): Payer: Medicaid Other | Admitting: Orthopaedic Surgery

## 2021-01-08 DIAGNOSIS — M67432 Ganglion, left wrist: Secondary | ICD-10-CM

## 2021-01-08 MED ORDER — NAPROXEN 500 MG PO TABS
500.0000 mg | ORAL_TABLET | Freq: Two times a day (BID) | ORAL | 3 refills | Status: DC
Start: 2021-01-08 — End: 2021-01-15

## 2021-01-08 MED ORDER — CYCLOBENZAPRINE HCL 5 MG PO TABS: 5.0000 mg | ORAL_TABLET | Freq: Three times a day (TID) | ORAL | 3 refills | Status: DC | PRN
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
## 2021-01-15 ENCOUNTER — Other Ambulatory Visit: Payer: Self-pay

## 2021-01-15 ENCOUNTER — Ambulatory Visit (INDEPENDENT_AMBULATORY_CARE_PROVIDER_SITE_OTHER): Payer: Medicaid Other | Admitting: Physician Assistant

## 2021-01-15 ENCOUNTER — Encounter: Payer: Self-pay | Admitting: Orthopaedic Surgery

## 2021-01-15 DIAGNOSIS — M67432 Ganglion, left wrist: Secondary | ICD-10-CM

## 2021-01-15 MED ORDER — CYCLOBENZAPRINE HCL 5 MG PO TABS: 5.0000 mg | ORAL_TABLET | Freq: Three times a day (TID) | ORAL | 3 refills | Status: DC | PRN

## 2021-01-15 MED ORDER — NAPROXEN 500 MG PO TABS: 500.0000 mg | ORAL_TABLET | Freq: Two times a day (BID) | ORAL | 3 refills | Status: DC
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
## 2021-01-22 ENCOUNTER — Other Ambulatory Visit: Payer: Self-pay

## 2021-01-22 ENCOUNTER — Ambulatory Visit (INDEPENDENT_AMBULATORY_CARE_PROVIDER_SITE_OTHER): Payer: Medicaid Other | Admitting: Family Medicine

## 2021-01-22 ENCOUNTER — Encounter: Payer: Self-pay | Admitting: Family Medicine

## 2021-01-22 DIAGNOSIS — F411 Generalized anxiety disorder: Secondary | ICD-10-CM

## 2021-01-22 DIAGNOSIS — J4520 Mild intermittent asthma, uncomplicated: Secondary | ICD-10-CM | POA: Diagnosis not present

## 2021-01-22 NOTE — Patient Instructions (Signed)
Your under a lot now, but you are handling it well.  I like your plans.   Lets check in with each other around the holidays to see that you are doing well.

## 2021-01-23 ENCOUNTER — Encounter: Payer: Self-pay | Admitting: Family Medicine

## 2021-01-23 NOTE — Progress Notes (Signed)
[name] is alone Sources of clinical information for visit is/are patient. Nursing assessment for this office visit was reviewed with the patient for accuracy and revision.     Previous Report(s) Reviewed: none  Depression screen PHQ 2/9 01/22/2021  Decreased Interest 1  Down, Depressed, Hopeless 2  PHQ - 2 Score 3  Altered sleeping 2  Tired, decreased energy 1  Change in appetite 1  Feeling bad or failure about yourself  1  Trouble concentrating 2  Moving slowly or fidgety/restless 1  Suicidal thoughts 0  PHQ-9 Score 11  Difficult doing work/chores Very difficult  Some recent data might be hidden   Flowsheet Row Office Visit from 01/22/2021 in [facility] Office Visit from 11/27/2020 in [facility] Office Visit from 07/31/2020 in [facility]  Thoughts that you would be better off dead, or of hurting yourself in some way Not at all Several days Not at all  PHQ-9 Total Score 11 14 5        Fall Risk  12/25/2020 10/14/2020 09/09/2020 07/31/2020 09/23/2016  Falls in the past year? 1 1 0 0 No  Comment Last fall is Sept . No injury Last fall in June 2022. No major injury. - - -  Number falls in past yr: 1 1 - 0 -  Injury with Fall? 0 0 - 0 -  Risk for fall due to : - - - - -    PHQ9 SCORE ONLY 01/22/2021 12/25/2020 11/27/2020  PHQ-9 Total Score 11 2 14     Adult vaccines due  Topic Date Due   TETANUS/TDAP  01/22/2022 (Originally 11/29/2017)    Health Maintenance Due  Topic Date Due   PAP SMEAR-Modifier  01/26/2021      History/P.E. limitations: none  Adult vaccines due  Topic Date Due   TETANUS/TDAP  01/22/2022 (Originally 11/29/2017)   There are no preventive care reminders to display for this patient.  Health Maintenance Due  Topic Date Due   PAP SMEAR-Modifier  01/26/2021     Chief Complaint  Patient presents with   Follow up    CPT E&M Office Visit Time Before Visit;  reviewing medical records (e.g. recent visits, labs, studies): 5 minutes During Visit (F2F time): 10 minutes After Visit (discussion with family or HCP, prescribing, ordering, referring, calling result/recommendations or documenting on same day): 5 minutes Total Visit Time: 20 minutes

## 2021-01-23 NOTE — Assessment & Plan Note (Signed)
Established problem Multiple stressors incluing fiances, housing, family, work that are demanding a lot of [name] [employer]. She has a plan to deal with them, which seems to give her some peace.    Supportive discussion provided.  Will check in with [name] around holidays.

## 2021-01-28 ENCOUNTER — Ambulatory Visit (INDEPENDENT_AMBULATORY_CARE_PROVIDER_SITE_OTHER): Payer: Medicaid Other | Admitting: Family Medicine

## 2021-01-28 ENCOUNTER — Encounter: Payer: Self-pay | Admitting: Family Medicine

## 2021-01-28 ENCOUNTER — Other Ambulatory Visit (HOSPITAL_COMMUNITY)
Admission: RE | Admit: 2021-01-28 | Discharge: 2021-01-28 | Disposition: A | Discharge status: Home / Self Care | Payer: Medicare Other | Source: Ambulatory Visit | Attending: Family Medicine | Admitting: Family Medicine

## 2021-01-28 ENCOUNTER — Other Ambulatory Visit: Payer: Self-pay

## 2021-01-28 VITALS — BP 122/96 | HR 78 | Ht 66.0 in | Wt 153.4 lb

## 2021-01-28 DIAGNOSIS — N898 Other specified noninflammatory disorders of vagina: Secondary | ICD-10-CM | POA: Diagnosis present

## 2021-01-28 DIAGNOSIS — Z113 Encounter for screening for infections with a predominantly sexual mode of transmission: Secondary | ICD-10-CM

## 2021-01-28 DIAGNOSIS — Z1159 Encounter for screening for other viral diseases: Secondary | ICD-10-CM | POA: Diagnosis not present

## 2021-01-28 LAB — POCT WET PREP (WET MOUNT)
Clue Cells Wet Prep Whiff POC: POSITIVE
Trichomonas Wet Prep HPF POC: ABSENT

## 2021-01-28 NOTE — Patient Instructions (Signed)
Thank you for coming in today. We will notify you of results when available. In the meantime I would suggest using non-scented soaps in the vaginal area. Also no cloths are better but if you use a cloth consider changing our frequently to minimize bacteria transfer.  Dr. Salvadore Dom

## 2021-01-28 NOTE — Progress Notes (Signed)
    SUBJECTIVE:   CHIEF COMPLAINT / HPI:   Ms. [name] is a 40 yo F who presents for the below.   STD testing/vaginal odor Desires STD testing today. Experiencing vaginal odor that is thin, clear to white, fishy, and appears to be increased. Denies pelvic pain, dysuria, back, pain, and fever. Contraception use: BTL   PERTINENT  PMH / PSH: HTN, CIN-1  OBJECTIVE:   BP (!) 122/96   Pulse 78   Ht 5\' 6"  (1.676 m)   Wt 153 lb 6 oz (69.6 kg)   LMP 09/05/2016 Comment: tubal ligation  SpO2 100%   BMI 24.76 kg/m   General: Appears well, no acute distress. Age appropriate. Respiratory: normal effort Pelvic exam: normal external genitalia, vulva, vagina, cervix, uterus and adnexa, WET MOUNT done, exam chaperoned by 09/07/2016, CMA. Extremities: No edema or cyanosis. Neuro: alert and oriented Psych: normal affect   ASSESSMENT/PLAN:   Vaginal odor  Screening examination for sexually transmitted disease  Screening for viral disease Speculum exam appears normal with thin clear discharge. Wait for results.  - POCT Wet Prep (Wet Mount) - GC/Ch - HIV antibody (with reflex) - RPR  Maree Erie, DO [facility]

## 2021-01-29 ENCOUNTER — Telehealth: Payer: Self-pay | Admitting: Family Medicine

## 2021-01-29 DIAGNOSIS — N760 Acute vaginitis: Secondary | ICD-10-CM

## 2021-01-29 LAB — HIV ANTIBODY (ROUTINE TESTING W REFLEX): HIV Screen 4th Generation wRfx: NONREACTIVE

## 2021-01-29 LAB — RPR: RPR Ser Ql: NONREACTIVE

## 2021-01-29 LAB — CERVICOVAGINAL ANCILLARY ONLY
Chlamydia: NEGATIVE
Comment: NEGATIVE
Comment: NORMAL
Neisseria Gonorrhea: NEGATIVE

## 2021-01-29 MED ORDER — METRONIDAZOLE 0.75 % VA GEL: 1.0000 | Freq: Every day | VAGINAL | 0 refills | Status: AC

## 2021-01-29 NOTE — Telephone Encounter (Signed)
Discussed +BV results. Will send metro gel per patient request.   Lavonda Jumbo, DO 01/29/2021, 6:28 PM PGY-3, [facility]

## 2021-02-12 ENCOUNTER — Encounter: Payer: Self-pay | Admitting: Orthopaedic Surgery

## 2021-02-12 ENCOUNTER — Other Ambulatory Visit: Payer: Self-pay

## 2021-02-12 ENCOUNTER — Ambulatory Visit (INDEPENDENT_AMBULATORY_CARE_PROVIDER_SITE_OTHER): Payer: Medicaid Other | Admitting: Physician Assistant

## 2021-02-12 DIAGNOSIS — M67432 Ganglion, left wrist: Secondary | ICD-10-CM
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
## 2021-02-20 ENCOUNTER — Ambulatory Visit: Payer: Medicare Other

## 2021-02-20 ENCOUNTER — Telehealth: Payer: Self-pay

## 2021-02-20 DIAGNOSIS — M47816 Spondylosis without myelopathy or radiculopathy, lumbar region: Secondary | ICD-10-CM

## 2021-02-20 NOTE — Telephone Encounter (Signed)
Patient calls nurse line regarding request for toradol injection. She reports that she used to have standing orders for toradol injection, however, unable to find this in chart.   Patient is also going to call pain management doctor.   Please advise.   Veronda Prude, RN

## 2021-02-20 NOTE — Telephone Encounter (Signed)
[name] - Shirley Vincelette may have a 15 mg intramuscular Toradol injection.  Jess - Could you put in the standing order for Toradol 15 mg intramuscular once a week prn pain for [name]?

## 2021-02-20 NOTE — Telephone Encounter (Signed)
Is now on med list. Jone Baseman, CMA

## 2021-02-23 ENCOUNTER — Telehealth: Payer: Self-pay

## 2021-02-23 ENCOUNTER — Ambulatory Visit (INDEPENDENT_AMBULATORY_CARE_PROVIDER_SITE_OTHER): Payer: Medicare Other

## 2021-02-23 ENCOUNTER — Other Ambulatory Visit: Payer: Self-pay

## 2021-02-23 DIAGNOSIS — M47816 Spondylosis without myelopathy or radiculopathy, lumbar region: Secondary | ICD-10-CM | POA: Diagnosis not present

## 2021-02-23 MED ORDER — KETOROLAC TROMETHAMINE 30 MG/ML IJ SOLN
15.0000 mg | Freq: Once | INTRAMUSCULAR | Status: AC
  Administered 2021-02-23: 15 mg via INTRAMUSCULAR

## 2021-02-23 NOTE — Progress Notes (Signed)
Pt is here for standing toradol injection.   Per Dr. Perley Jain may get 15 mg toradol up to once weekly. Patient wearing large "biofreeze" patch, covering both sides for injection. Precepted with Dr. Leveda Anna who advised removing patch and cleaning well prior to administration. Cleaned area well with alcohol and administered in RUOQ. Pt tolerated well.    Shirley Prude, RN

## 2021-02-23 NOTE — Telephone Encounter (Signed)
Patient is calling stating she pulled something in her back. She wants to know if she can get something to relieve the pain. She does have an appointment with Dr. Wynn Banker tomorrow 02/24/21.

## 2021-02-24 ENCOUNTER — Encounter: Payer: Self-pay | Admitting: Physical Medicine & Rehabilitation

## 2021-02-24 ENCOUNTER — Encounter: Payer: Medicare Other | Attending: Physical Medicine & Rehabilitation | Admitting: Physical Medicine & Rehabilitation

## 2021-02-24 VITALS — BP 123/86 | HR 77 | Temp 98.4°F | Ht 66.0 in | Wt 150.0 lb

## 2021-02-24 DIAGNOSIS — G8929 Other chronic pain: Secondary | ICD-10-CM

## 2021-02-24 DIAGNOSIS — M545 Low back pain, unspecified: Secondary | ICD-10-CM

## 2021-02-24 MED ORDER — METHYLPREDNISOLONE 4 MG PO TBPK: ORAL_TABLET | ORAL | 0 refills | Status: DC

## 2021-02-24 NOTE — Patient Instructions (Addendum)
Please call pharmacy to get cyclobenzaprine muscle relaxant refilled  Non medication pain relief  Try ice alternating with heating pad for 20 minutes every 2 hours as needed  Try TENs unit that can be puchased in a pharmacy for about 40.00, brands include Aleve or Icy Hot  Try various muscle cremes  Aspercreme or others that contain trolamine- this is anti inflammatory  Capsaicin creme which reduces Pain substance P  Lidocaine containing creme which has a numbing medicine  Methol and camphor containing creme which has a cooling effect
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
## 2021-02-25 ENCOUNTER — Telehealth: Payer: Self-pay | Admitting: Orthopaedic Surgery

## 2021-02-25 NOTE — Telephone Encounter (Signed)
Yes approve

## 2021-02-25 NOTE — Telephone Encounter (Signed)
Pt calling asking for her work note to be updated. Pt stated she was given a work note but the date to return was left from it and pt needs it to state she can return to work starting the 15th of this month. Pt stated it can be uploaded to mychart and she can pull it from there. The best call back number is [phone]

## 2021-02-25 NOTE — Telephone Encounter (Signed)
Sent to her mychart

## 2021-02-26 ENCOUNTER — Ambulatory Visit: Payer: Medicare Other | Attending: Physician Assistant | Admitting: Occupational Therapy

## 2021-03-03 ENCOUNTER — Ambulatory Visit: Payer: Medicare Other | Admitting: Occupational Therapy

## 2021-03-12 ENCOUNTER — Ambulatory Visit: Payer: Medicare Other | Admitting: Occupational Therapy

## 2021-03-13 ENCOUNTER — Other Ambulatory Visit: Payer: Self-pay

## 2021-03-13 ENCOUNTER — Ambulatory Visit (INDEPENDENT_AMBULATORY_CARE_PROVIDER_SITE_OTHER): Payer: Medicare Other | Admitting: Family Medicine

## 2021-03-13 ENCOUNTER — Encounter: Payer: Self-pay | Admitting: Family Medicine

## 2021-03-13 VITALS — BP 142/101 | HR 75 | Ht 66.0 in | Wt 158.0 lb

## 2021-03-13 DIAGNOSIS — B9689 Other specified bacterial agents as the cause of diseases classified elsewhere: Secondary | ICD-10-CM

## 2021-03-13 DIAGNOSIS — K219 Gastro-esophageal reflux disease without esophagitis: Secondary | ICD-10-CM | POA: Diagnosis not present

## 2021-03-13 DIAGNOSIS — N3281 Overactive bladder: Secondary | ICD-10-CM | POA: Diagnosis not present

## 2021-03-13 DIAGNOSIS — I10 Essential (primary) hypertension: Secondary | ICD-10-CM | POA: Diagnosis not present

## 2021-03-13 DIAGNOSIS — N760 Acute vaginitis: Secondary | ICD-10-CM

## 2021-03-13 MED ORDER — OMEPRAZOLE 20 MG PO CPDR
20.0000 mg | DELAYED_RELEASE_CAPSULE | Freq: Every day | ORAL | 3 refills | Status: DC
Start: 2021-03-13 — End: 2021-07-21

## 2021-03-13 MED ORDER — MIRABEGRON ER 50 MG PO TB24: 50.0000 mg | ORAL_TABLET | Freq: Every day | ORAL | 5 refills | Status: DC

## 2021-03-13 MED ORDER — METRONIDAZOLE 500 MG PO TABS
500.0000 mg | ORAL_TABLET | Freq: Two times a day (BID) | ORAL | 0 refills | Status: AC
Start: 2021-03-13 — End: 2021-03-20

## 2021-03-13 MED ORDER — AMLODIPINE BESYLATE 10 MG PO TABS: 10.0000 mg | ORAL_TABLET | Freq: Every day | ORAL | 3 refills | Status: DC

## 2021-03-13 NOTE — Patient Instructions (Addendum)
Lets try the mirabegron (Myrbetriq) instead of the oxybutinin for your over active bladder.  We refillled your omeprazole, and amlodipine.   A repeat treatment for bacterial vaginosis with metronidazole (Flagyl) twice a day for 7 days.   Let us visit in a month to check on your blood pressure.

## 2021-03-13 NOTE — Assessment & Plan Note (Signed)
Established problem Uncontrolled Restart omeproaole 20 mg daily

## 2021-03-13 NOTE — Assessment & Plan Note (Signed)
Established problem Uncontrolled on oxybutinin Start trial Mybetriq 50 mg daily

## 2021-03-13 NOTE — Assessment & Plan Note (Addendum)
Established problem Uncontrolled No signs of complications, medication side effects, or red flags. Restart Amlodipine 10 mg daily

## 2021-03-13 NOTE — Progress Notes (Signed)
[name] is alone Sources of clinical information for visit is/are patient. Nursing assessment for this office visit was reviewed with the patient for accuracy and revision.     Previous Report(s) Reviewed: none  Depression screen PHQ 2/9 03/13/2021  Decreased Interest 3  Down, Depressed, Hopeless 3  PHQ - 2 Score 6  Altered sleeping 1  Tired, decreased energy 2  Change in appetite 1  Feeling bad or failure about yourself  1  Trouble concentrating 2  Moving slowly or fidgety/restless 1  Suicidal thoughts 1  PHQ-9 Score 15  Difficult doing work/chores Extremely dIfficult  Some recent data might be hidden   Loss adjuster, chartered Office Visit from 03/13/2021 in [facility] Office Visit from 01/28/2021 in [facility] Office Visit from 01/22/2021 in [facility]  Thoughts that you would be better off dead, or of hurting yourself in some way Several days Several days Not at all  PHQ-9 Total Score 15 14 11        Fall Risk  02/24/2021 12/25/2020 10/14/2020 09/09/2020 07/31/2020  Falls in the past year? 1 1 1  0 0  Comment - Last fall is Sept . No injury Last fall in June 2022. No major injury. - -  Number falls in past yr: 0 1 1 - 0  Injury with Fall? 0 0 0 - 0  Risk for fall due to : - - - - -    PHQ9 SCORE ONLY 03/13/2021 01/28/2021 01/22/2021  PHQ-9 Total Score 15 14 11     Adult vaccines due  Topic Date Due   TETANUS/TDAP  01/22/2022 (Originally 11/29/2017)    Health Maintenance Due  Topic Date Due   PAP SMEAR-Modifier  01/26/2021      History/P.E. limitations: none  Adult vaccines due  Topic Date Due   TETANUS/TDAP  01/22/2022 (Originally 11/29/2017)   There are no preventive care reminders to display for this patient.  Health Maintenance Due  Topic Date Due   PAP SMEAR-Modifier  01/26/2021     Chief Complaint  Patient presents with   Follow-up    Mental health    CPT E&M Office Visit Time Before  Visit; reviewing medical records (e.g. recent visits, labs, studies): 7 minutes During Visit (F2F time): 25 minutes After Visit (discussion with family or HCP, prescribing, ordering, referring, calling result/recommendations or documenting on same day): 7 minutes Total Visit Time: 39 minutes  Level 2 Est: 10-19 min     New: 15-29 min Level 3 Est: 20-29 min     New: 30-44 min Level 4 Est: 30-39 min     New: 45-59 min Level 5 Est: 40-54 min     New: 60-74 min   > Level 5 - see prolonged service CPT E&M codes; 99XXX

## 2021-03-13 NOTE — Assessment & Plan Note (Signed)
Relapse Rx Flagyl 500 mg twice a day x 7 days

## 2021-03-17 ENCOUNTER — Other Ambulatory Visit: Payer: Self-pay

## 2021-03-17 ENCOUNTER — Ambulatory Visit: Payer: Medicare Other | Attending: Physician Assistant | Admitting: Occupational Therapy

## 2021-03-17 DIAGNOSIS — M67432 Ganglion, left wrist: Secondary | ICD-10-CM | POA: Diagnosis not present

## 2021-03-17 DIAGNOSIS — R278 Other lack of coordination: Secondary | ICD-10-CM | POA: Diagnosis not present

## 2021-03-17 DIAGNOSIS — R208 Other disturbances of skin sensation: Secondary | ICD-10-CM | POA: Diagnosis not present

## 2021-03-17 DIAGNOSIS — M6281 Muscle weakness (generalized): Secondary | ICD-10-CM | POA: Diagnosis not present
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
## 2021-03-19 ENCOUNTER — Telehealth: Payer: Self-pay | Admitting: Orthopaedic Surgery

## 2021-03-19 NOTE — Telephone Encounter (Signed)
[employer] forms received. To [employer].

## 2021-03-23 ENCOUNTER — Encounter: Payer: Self-pay | Admitting: Family Medicine

## 2021-03-23 NOTE — Progress Notes (Signed)
Pap smear collection thin prep with high risk HPV testing.  Provider Nelta Numbers, MDOBGYN 08/21/20

## 2021-03-24 ENCOUNTER — Other Ambulatory Visit: Payer: Self-pay

## 2021-03-24 ENCOUNTER — Ambulatory Visit: Payer: Medicare Other | Admitting: Occupational Therapy

## 2021-03-24 DIAGNOSIS — R278 Other lack of coordination: Secondary | ICD-10-CM | POA: Diagnosis not present

## 2021-03-24 DIAGNOSIS — R208 Other disturbances of skin sensation: Secondary | ICD-10-CM

## 2021-03-24 DIAGNOSIS — M67432 Ganglion, left wrist: Secondary | ICD-10-CM | POA: Diagnosis not present

## 2021-03-24 DIAGNOSIS — M6281 Muscle weakness (generalized): Secondary | ICD-10-CM
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
## 2021-03-24 NOTE — Patient Instructions (Signed)
°  1. Grip Strengthening (Resistive Putty)   Squeeze putty using thumb and all fingers. Repeat _15-20___ times. Do __2__ sessions per day.   2. Roll putty into tube on table and pinch between first 2 fingers and thumb x 10, then last 2 fingers and thumb x 10 (remember these will be weaker - that is normal!). Do 2 sessions per day   Safety considerations  1) Look at hand when using it! 2) Test temperature of water with Rt hand 3) Do not hold anything heavy, hot, sharp, or breakable with Lt hand 4) Hold coffee/hot drinks w/ Rt hand or use travel mug w/ lid    Desensitization Techniques  Perform these exercises ever 2 hours for 15 minute sessions.  Progress to the next exercises when the exercises you are doing become easy.  1)  Using light pressure, rub the various textures along with the hypersensitive area:  A.  Flannel  E.  Polyester  B.  Velvet  F.  Corduroy  C.  Wool  G.  Cotton material  D.  Terry cloth  2)  With the same textures use a firmer pressure.  Scar Massage Purpose: To soften/smooth scar tissue.   To desensitize sensitive areas after surgery.   To mechanically break up inner scar tissue, adhesions, therefore allowing freer        movement of injured tendons and muscle.  Technique: Use a cream to massage with, as it insures a smooth gliding motion and avoids irritation  caused by rubbing skin to skin.  Cream is preferred over a lotion.   May begin as soon as any suture areas are healed.   Apply a firm, steady pressure with your finger-tip, pulling the skin in a circular motion over the scarred area.  Do not rub.  Message 5 minutes, at least 2 times a day, unless you are getting tender afterwards  SENSORY RETRAINING:   Place rice in shoebox size plastic tupperware container, then place small safe objects in it:  Key, paperclip, coins, nuts/bolts, marbles, buttons, etc. (Nothing sharp). Hold box with Rt hand, then close eyes and place Lt hand in and try to pull  out all objects

## 2021-03-26 ENCOUNTER — Ambulatory Visit: Payer: Medicare Other | Admitting: Occupational Therapy

## 2021-03-30 ENCOUNTER — Telehealth: Payer: Self-pay | Admitting: Orthopaedic Surgery

## 2021-03-30 NOTE — Telephone Encounter (Signed)
Received vm from pt. IC,lmvm advised we have received forms and they are with [employer]. Advised her to contact [employer] directly so she can give them direction on how she needs forms completed.

## 2021-03-31 ENCOUNTER — Ambulatory Visit (INDEPENDENT_AMBULATORY_CARE_PROVIDER_SITE_OTHER): Payer: Medicare Other | Admitting: Orthopaedic Surgery

## 2021-03-31 ENCOUNTER — Encounter: Payer: Self-pay | Admitting: Orthopaedic Surgery

## 2021-03-31 ENCOUNTER — Telehealth: Payer: Self-pay | Admitting: Orthopaedic Surgery

## 2021-03-31 ENCOUNTER — Other Ambulatory Visit: Payer: Self-pay

## 2021-03-31 DIAGNOSIS — M67432 Ganglion, left wrist: Secondary | ICD-10-CM

## 2021-03-31 NOTE — Telephone Encounter (Signed)
Patient currently has paperwork in chart from [place] and [employer] regarding short-term disability. [employer] has instructed to see if Dr. Roda Shutters or Ezzie Dural can fill out the paperwork since she cannot pay the fee for [employer] to complete paperwork due to her STD not being processed yet. She has medical flex card but [employer] will not except that method of payment. Please advise!

## 2021-03-31 NOTE — Telephone Encounter (Signed)
Patient called needing the Date of her surgery and the attending physician for [facility].  Patient said [employer] do not need any paperwork from the provider. The number to contact [employer] is  [phone] The number to contact patient is [phone]

## 2021-03-31 NOTE — Telephone Encounter (Signed)
Yes, we have the forms. She stated she couldn't pay.
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
## 2021-04-01 ENCOUNTER — Ambulatory Visit: Payer: Medicare Other | Admitting: Occupational Therapy

## 2021-04-01 NOTE — Telephone Encounter (Signed)
Letter made. Will send mychart msg to patient

## 2021-04-01 NOTE — Telephone Encounter (Signed)
See message per patient.   Patient called needing the Date of her surgery and the attending physician for [facility].  Patient said [facility] do not need any paperwork from the provider. The number to contact [facility]  [employer] is  [phone] The number to contact patient is [phone]

## 2021-04-02 ENCOUNTER — Telehealth: Payer: Self-pay

## 2021-04-02 ENCOUNTER — Ambulatory Visit: Payer: Medicare Other | Admitting: Occupational Therapy

## 2021-04-02 NOTE — Telephone Encounter (Signed)
Called patient and left message re: no showing 2 O.T. appointments on 04/01/21 and 04/02/21. Reminder given of next scheduled appointment and that all appointments would be cancelled if she no shows again.

## 2021-04-06 ENCOUNTER — Ambulatory Visit: Payer: Medicare Other | Admitting: Occupational Therapy

## 2021-04-06 NOTE — Therapy (Unsigned)
[facility] [address] [place], [place], [address] Phone: [phone]   Fax:  [phone]  Patient Details  Name: [name] MRN: [id number] Date of Birth: [birth year] Referring Provider:  No ref. provider found  Encounter Date: 04/06/2021  Pt will be discharged from O.T. at this time secondary to not returning after 2nd visit. Pt has no showed x 3.   Carey Bullocks, OTR/L 04/06/2021, 11:31 AM  [facility] [address] [place], [place], [address] Phone: [phone]   Fax:  [phone]

## 2021-04-08 ENCOUNTER — Telehealth: Payer: Self-pay | Admitting: Orthopaedic Surgery

## 2021-04-08 NOTE — Telephone Encounter (Signed)
[facility] forms received. To [employer]. 

## 2021-04-09 ENCOUNTER — Ambulatory Visit: Payer: Medicare Other | Admitting: Occupational Therapy

## 2021-04-10 ENCOUNTER — Ambulatory Visit: Payer: Medicare Other | Admitting: Physical Medicine & Rehabilitation

## 2021-04-13 ENCOUNTER — Ambulatory Visit: Payer: Medicare Other | Admitting: Occupational Therapy

## 2021-04-14 ENCOUNTER — Ambulatory Visit: Payer: Medicare Other | Admitting: Family Medicine

## 2021-04-15 ENCOUNTER — Telehealth: Payer: Self-pay | Admitting: Orthopaedic Surgery

## 2021-04-15 NOTE — Telephone Encounter (Signed)
Received $25.00 cash and medical records release form from patient /   Forwarding to [employer] today 

## 2021-04-16 ENCOUNTER — Encounter: Payer: Medicare Other | Admitting: Occupational Therapy

## 2021-04-22 ENCOUNTER — Ambulatory Visit: Payer: Medicare Other | Admitting: Physician Assistant

## 2021-04-28 DIAGNOSIS — H5010 Unspecified exotropia: Secondary | ICD-10-CM | POA: Diagnosis not present

## 2021-04-28 DIAGNOSIS — H538 Other visual disturbances: Secondary | ICD-10-CM | POA: Diagnosis not present

## 2021-04-28 DIAGNOSIS — H53029 Refractive amblyopia, unspecified eye: Secondary | ICD-10-CM | POA: Diagnosis not present

## 2021-04-29 ENCOUNTER — Other Ambulatory Visit: Payer: Self-pay

## 2021-04-29 ENCOUNTER — Ambulatory Visit (INDEPENDENT_AMBULATORY_CARE_PROVIDER_SITE_OTHER): Payer: Medicare Other | Admitting: Orthopaedic Surgery

## 2021-04-29 ENCOUNTER — Encounter: Payer: Self-pay | Admitting: Orthopaedic Surgery

## 2021-04-29 DIAGNOSIS — M67432 Ganglion, left wrist: Secondary | ICD-10-CM

## 2021-04-29 MED ORDER — TRAMADOL HCL 50 MG PO TABS: 50.0000 mg | ORAL_TABLET | Freq: Three times a day (TID) | ORAL | 2 refills | Status: DC | PRN
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
## 2021-05-01 ENCOUNTER — Encounter: Payer: Medicare Other | Admitting: Physical Medicine & Rehabilitation

## 2021-05-05 ENCOUNTER — Ambulatory Visit (INDEPENDENT_AMBULATORY_CARE_PROVIDER_SITE_OTHER): Payer: Medicare Other | Admitting: Family Medicine

## 2021-05-05 ENCOUNTER — Other Ambulatory Visit: Payer: Self-pay

## 2021-05-05 ENCOUNTER — Encounter: Payer: Self-pay | Admitting: Family Medicine

## 2021-05-05 VITALS — BP 126/91 | HR 69 | Ht 66.0 in | Wt 166.1 lb

## 2021-05-05 DIAGNOSIS — I10 Essential (primary) hypertension: Secondary | ICD-10-CM | POA: Diagnosis not present

## 2021-05-05 DIAGNOSIS — N3281 Overactive bladder: Secondary | ICD-10-CM | POA: Diagnosis not present

## 2021-05-05 DIAGNOSIS — N3941 Urge incontinence: Secondary | ICD-10-CM

## 2021-05-05 MED ORDER — OXYBUTYNIN CHLORIDE ER 10 MG PO TB24: 10.0000 mg | ORAL_TABLET | Freq: Every day | ORAL | 3 refills | Status: DC

## 2021-05-05 NOTE — Progress Notes (Signed)
[name] is alone Sources of clinical information for visit is/are patient. Nursing assessment for this office visit was reviewed with the patient for accuracy and revision.     Previous Report(s) Reviewed: office visit note from Dr Roda Shutters (ortho)  Depression screen [facility] 2/9 05/05/2021  Decreased Interest 1  Down, Depressed, Hopeless 1  PHQ - 2 Score 2  Altered sleeping 2  Tired, decreased energy 1  Change in appetite 1  Feeling bad or failure about yourself  1  Trouble concentrating 1  Moving slowly or fidgety/restless 0  Suicidal thoughts 0  PHQ-9 Score 8  Difficult doing work/chores Very difficult  Some recent data might be hidden   Flowsheet Row Office Visit from 05/05/2021 in [facility] Office Visit from 03/13/2021 in [facility] Office Visit from 01/28/2021 in [facility]  Thoughts that you would be better off dead, or of hurting yourself in some way Not at all Several days Several days  PHQ-9 Total Score 8 15 14        Fall Risk  02/24/2021 12/25/2020 10/14/2020 09/09/2020 07/31/2020  Falls in the past year? 1 1 1  0 0  Comment - Last fall is Sept . No injury Last fall in June 2022. No major injury. - -  Number falls in past yr: 0 1 1 - 0  Injury with Fall? 0 0 0 - 0  Risk for fall due to : - - - - -    PHQ9 SCORE ONLY 05/05/2021 03/13/2021 01/28/2021  PHQ-9 Total Score 8 15 14     Adult vaccines due  Topic Date Due   TETANUS/TDAP  01/22/2022 (Originally 11/29/2017)    There are no preventive care reminders to display for this patient.    History/P.E. limitations: none  Adult vaccines due  Topic Date Due   TETANUS/TDAP  01/22/2022 (Originally 11/29/2017)   There are no preventive care reminders to display for this patient. There are no preventive care reminders to display for this patient.   Chief Complaint  Patient presents with   Follow-up

## 2021-05-05 NOTE — Patient Instructions (Addendum)
Take both the Myrbetriq (mirabegron) to mg daily toether with the Oxybutinin XL 10 mg daily. Dr Desani Sprung would like to see you back in 6 weeks to see how your incontinence is doing.  These are foods that can irritate the bladder and make you want to pee.  Try limiting the most bothersome foods.     The Bladder Diet A way to manage that sudden urge to pee is to change what you eat to avoid irritative beverages and foods.  The Most Bothersome Foods The Least Bothersome Foods  Coffee - Regular & Decaf Tea - caffeinated Carbonated beverages - cola, non-colas, diet & caffeine-free Alcohols - Beer, Red Wine, White Wine, Champagne Fruits - Grapefruit, [place], Orange, [employer] - Cranberry, Grapefruit, Orange, Pineapple Vegetables - Tomato & Tomato Products Flavor Enhancers - Hot peppers, Spicy foods, Chili, Horseradish, Vinegar, Monosodium glutamate (MSG) Artificial Sweeteners - NutraSweet, Sweet 'N Low, Equal (sweetener), Saccharin Ethnic foods - [place], [place], [place] food Express Scripts - low-fat & whole Fruits - Bananas, Blueberries, Honeydew melon, Pears, Raisins, Watermelon Vegetables - Broccoli, Brussels Sprouts, [place], Carrots, Cauliflower, [place], Cucumber, Mushrooms, Peas, Radishes, Squash, Zucchini, White potatoes, Sweet potatoes & yams Poultry - Chicken, Eggs, [place], Scientist, research (physical sciences) - Beef, Programmer, multimedia, Lamb Seafood - Shrimp, [place] fish, Salmon Grains - Oat, Rice Snacks - Pretzels, Popcorn

## 2021-05-06 ENCOUNTER — Encounter: Payer: Self-pay | Admitting: Family Medicine

## 2021-05-06 NOTE — Assessment & Plan Note (Signed)
Established problem Well Controlled. No signs of complications, medication side effects, or red flags. Continue current medications and other regiments.  

## 2021-05-06 NOTE — Assessment & Plan Note (Signed)
Established problem Uncontrolled on mybetriq Start: oxybutin XL 10 mg daily  Continue: Myrbetriq  RTC 4-6 weeks to assess response.

## 2021-05-06 NOTE — Progress Notes (Signed)
Records of office visit from 04/10/20 & 11/27/20 concerning patient's left wrist ganglion cyst sent to [employer] disability m[employer]t.

## 2021-05-07 ENCOUNTER — Encounter: Payer: Medicare Other | Attending: Physical Medicine & Rehabilitation | Admitting: Physical Medicine & Rehabilitation

## 2021-05-07 DIAGNOSIS — G8929 Other chronic pain: Secondary | ICD-10-CM | POA: Insufficient documentation

## 2021-05-07 DIAGNOSIS — M545 Low back pain, unspecified: Secondary | ICD-10-CM | POA: Insufficient documentation

## 2021-05-26 ENCOUNTER — Ambulatory Visit: Payer: Medicare Other | Admitting: Orthopaedic Surgery

## 2021-06-03 ENCOUNTER — Telehealth: Payer: Self-pay | Admitting: Orthopaedic Surgery

## 2021-06-03 NOTE — Telephone Encounter (Signed)
Received $25.00 cash,medical records release form and disability paperwork/ Forwarding to [employer] today 

## 2021-06-03 NOTE — Telephone Encounter (Signed)
yes

## 2021-06-03 NOTE — Telephone Encounter (Signed)
Spoke with patient she asked if she can get a note to return back to work part time for 6 months.  The number to contact patient is [phone]  ?

## 2021-06-05 DIAGNOSIS — H53029 Refractive amblyopia, unspecified eye: Secondary | ICD-10-CM | POA: Diagnosis not present

## 2021-06-05 DIAGNOSIS — H5010 Unspecified exotropia: Secondary | ICD-10-CM | POA: Diagnosis not present

## 2021-06-05 DIAGNOSIS — H538 Other visual disturbances: Secondary | ICD-10-CM | POA: Diagnosis not present

## 2021-06-05 NOTE — Telephone Encounter (Signed)
Note printed for [employer] 

## 2021-06-11 ENCOUNTER — Telehealth: Payer: Self-pay | Admitting: Orthopaedic Surgery

## 2021-06-11 NOTE — Telephone Encounter (Signed)
Patient came in office today to check on forms. Advised pt [employer] is out of office until next week. She states she needs turned in by Monday. I gave patient her $25.00 money back and advised I will complete form and submit by Friday 06/12/21 ?

## 2021-06-13 HISTORY — PX: EYE SURGERY: SHX253

## 2021-06-17 DIAGNOSIS — H50112 Monocular exotropia, left eye: Secondary | ICD-10-CM | POA: Diagnosis not present

## 2021-07-08 ENCOUNTER — Other Ambulatory Visit: Payer: Self-pay | Admitting: Family Medicine

## 2021-07-21 ENCOUNTER — Other Ambulatory Visit: Payer: Self-pay | Admitting: Family Medicine

## 2021-07-21 DIAGNOSIS — K219 Gastro-esophageal reflux disease without esophagitis: Secondary | ICD-10-CM

## 2021-08-18 ENCOUNTER — Encounter: Payer: Self-pay | Admitting: *Deleted

## 2021-08-24 DIAGNOSIS — I10 Essential (primary) hypertension: Secondary | ICD-10-CM | POA: Diagnosis not present

## 2021-08-24 DIAGNOSIS — Z1159 Encounter for screening for other viral diseases: Secondary | ICD-10-CM | POA: Diagnosis not present

## 2021-08-25 ENCOUNTER — Encounter: Payer: Medicare Other | Attending: Physical Medicine & Rehabilitation | Admitting: Physical Medicine & Rehabilitation

## 2021-08-25 ENCOUNTER — Telehealth: Payer: Self-pay

## 2021-08-25 ENCOUNTER — Encounter: Payer: Self-pay | Admitting: Physical Medicine & Rehabilitation

## 2021-08-25 VITALS — BP 122/86 | HR 82 | Ht 66.0 in | Wt 159.0 lb

## 2021-08-25 DIAGNOSIS — M47816 Spondylosis without myelopathy or radiculopathy, lumbar region: Secondary | ICD-10-CM | POA: Diagnosis not present

## 2021-08-25 NOTE — Patient Instructions (Signed)
Will do Bilateral medial branch RF at next visit

## 2021-08-25 NOTE — Telephone Encounter (Signed)
[name] is scheduled for an radiofrequency on 10/13/2021. Patient want to know how to control her  pain until August 1st?  She wanted to know if she could come in for Toradol injections? Please advise. Thank you.
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
## 2021-09-24 ENCOUNTER — Ambulatory Visit (INDEPENDENT_AMBULATORY_CARE_PROVIDER_SITE_OTHER): Payer: Medicare Other | Admitting: Podiatry

## 2021-09-24 DIAGNOSIS — M79674 Pain in right toe(s): Secondary | ICD-10-CM | POA: Diagnosis not present

## 2021-09-24 DIAGNOSIS — L600 Ingrowing nail: Secondary | ICD-10-CM

## 2021-09-24 DIAGNOSIS — M79675 Pain in left toe(s): Secondary | ICD-10-CM

## 2021-09-24 NOTE — Patient Instructions (Signed)

## 2021-09-25 ENCOUNTER — Ambulatory Visit: Payer: Medicare Other | Admitting: Podiatry
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
## 2021-10-06 ENCOUNTER — Ambulatory Visit (INDEPENDENT_AMBULATORY_CARE_PROVIDER_SITE_OTHER): Payer: Medicare Other | Admitting: Orthopaedic Surgery

## 2021-10-06 DIAGNOSIS — M25532 Pain in left wrist: Secondary | ICD-10-CM | POA: Diagnosis not present

## 2021-10-06 DIAGNOSIS — M67432 Ganglion, left wrist: Secondary | ICD-10-CM | POA: Diagnosis not present

## 2021-10-06 MED ORDER — PREDNISONE 10 MG (21) PO TBPK: ORAL_TABLET | ORAL | 3 refills | Status: DC
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
## 2021-10-09 ENCOUNTER — Telehealth: Payer: Self-pay

## 2021-10-09 ENCOUNTER — Other Ambulatory Visit: Payer: Self-pay | Admitting: Physical Medicine & Rehabilitation

## 2021-10-09 MED ORDER — DIAZEPAM 10 MG PO TABS: 10.0000 mg | ORAL_TABLET | Freq: Once | ORAL | 0 refills | Status: AC

## 2021-10-09 NOTE — Telephone Encounter (Signed)
Rx called in to pharmacy for pre-procedure on 10/13/2021.

## 2021-10-09 NOTE — Telephone Encounter (Signed)
Patient called for refill of Gabapentin 600 MG. She is out of the RX.

## 2021-10-13 ENCOUNTER — Encounter: Payer: Self-pay | Admitting: Physical Medicine & Rehabilitation

## 2021-10-13 ENCOUNTER — Encounter: Payer: Medicare Other | Attending: Physical Medicine & Rehabilitation | Admitting: Physical Medicine & Rehabilitation

## 2021-10-13 VITALS — BP 126/90 | HR 77 | Ht 66.0 in | Wt 154.4 lb

## 2021-10-13 DIAGNOSIS — M47816 Spondylosis without myelopathy or radiculopathy, lumbar region: Secondary | ICD-10-CM | POA: Insufficient documentation

## 2021-10-13 NOTE — Patient Instructions (Signed)
We did Right L3-4-5 RF today.  We can do left side next month.    If you prefer to have the procedure done with conscious sedation , you can be referred to [facility], which is a [facility] facility

## 2021-10-13 NOTE — Progress Notes (Signed)
RightL5 dorsal ramus., Right L4 and Right L3 medial branch radio frequency neurotomy under fluoroscopic guidance   Indication: Low back pain due to lumbar spondylosis which has been relieved on 2 occasions by greater than 50% by lumbar medial branch blocks at corresponding levels.  Informed consent was obtained after describing risks and benefits of the procedure with the patient, this includes bleeding, bruising, infection, paralysis and medication side effects. The patient wishes to proceed and has given written consent. The patient was placed in a prone position. The lumbar and sacral area was marked and prepped with Betadine. A 25-gauge 1-1/2 inch needle was inserted into the skin and subcutaneous tissue at 3 sites in one ML of 1% lidocaine was injected into each site. Then a 18-gauge 10 cm radio frequency needle with a 1 cm curved active tip was inserted targeting the Right S1 SAP/sacral ala junction. Bone contact was made and confirmed with lateral imaging.  motor stimulation at 2 Hz confirm proper needle location followed by injection of 75ml 2% MPF lidocaine. Then the Right L5 SAP/transverse process junction was targeted. Bone contact was made and confirmed with lateral imaging.  motor stimulation at 2 Hz confirm proper needle location followed by injection of 57ml 2% MPF lidocaine. Then the Right L4 SAP/transverse process junction was targeted. Bone contact was made and confirmed with lateral imaging. motor stimulation at 2 Hz confirm proper needle location followed by injection of 53ml 2% MPF lidocaine. Radio frequency lesion being at [facility] for 90 seconds was performed. Needles were removed. Post procedure instructions and vital signs were performed. Patient tolerated procedure well. Followup appointment was given.   Originally planned to do bilateral L3-4-5 RF but pt c/o sweating and anxiety during the procedure despite premedication with Valium 10mg  po .  THerefore only the right side was performed    Discussed the option of referral to [facility] for Left L3-4-5 RF under conscious sedation.  Pt wishes to pursue this option , referral made.  Will see prn

## 2021-10-13 NOTE — Progress Notes (Deleted)
Bilateral L5 dorsal ramus.,  L4 and  L3 medial branch radio frequency neurotomy under fluoroscopic guidance   Indication: Low back pain due to lumbar spondylosis which has been relieved on 2 occasions by greater than 50% by lumbar medial branch blocks at corresponding levels.  Informed consent was obtained after describing risks and benefits of the procedure with the patient, this includes bleeding, bruising, infection, paralysis and medication side effects. The patient wishes to proceed and has given written consent. The patient was placed in a prone position. The lumbar and sacral area was marked and prepped with Betadine. A 25-gauge 1-1/2 inch needle was inserted into the skin and subcutaneous tissue at 3 sites in one ML of 1% lidocaine was injected into each site. Then a 18-gauge 10 cm radio frequency needle with a 1 cm curved active tip was inserted targeting the Right S1 SAP/sacral ala junction. Bone contact was made and confirmed with lateral imaging.  motor stimulation at 2 Hz confirm proper needle location followed by injection of 14ml 2% MPF lidocaine. Then the Right L5 SAP/transverse process junction was targeted. Bone contact was made and confirmed with lateral imaging.  motor stimulation at 2 Hz confirm proper needle location followed by injection of 25ml 2% MPF lidocaine. Then the Right L4 SAP/transverse process junction was targeted. Bone contact was made and confirmed with lateral imaging. motor stimulation at 2 Hz confirm proper needle location followed by injection of 7ml 2% MPF lidocaine. Radio frequency lesion being at [facility] for 90 seconds was performed. Needles were removed.Same procedure was performed on the left side using same needles injectate and technique.   Post procedure instructions and vital signs were performed. Patient tolerated procedure well. Followup appointment was given.

## 2021-10-13 NOTE — Progress Notes (Signed)
  PROCEDURE RECORD [facility]   Name: [name] DOB:11-Feb-1981 MRN: [id number]  Date:10/13/2021  Physician: Claudette Laws, MD    Nurse/CMA: Yetta Barre, RMA  Allergies:  Allergies  Allergen Reactions   Azelastine Anaphylaxis   Azelastine Hcl Other (See Comments)    Headache Other reaction(s): Other (See Comments) Headache    Consent Signed: Yes.    Is patient diabetic? No.  CBG today? .  Pregnant: No. LMP: Patient's last menstrual period was 09/05/2016. (age 25-55)  Anticoagulants: no Anti-inflammatory: no Antibiotics: no  Procedure: Bilateral L3-4-5 Radiofrequency  Position: Prone Start Time: 3:36 pm  End Time: 4 pm  Fluoro Time: 50  RN/CMA Nicko Daher, RMA Keirra Zeimet, RMA    Time 3:15 pm 4:02 pm    BP 127/89 126/90    Pulse 103 77    Respirations 16 18    O2 Sat 98 98    S/S 6 6    Pain Level 8/10 6/10     D/C home with mother, patient A & O X 3, D/C instructions reviewed, and sits independently.

## 2021-10-15 ENCOUNTER — Ambulatory Visit (INDEPENDENT_AMBULATORY_CARE_PROVIDER_SITE_OTHER): Payer: Medicare Other | Admitting: Podiatry

## 2021-10-15 ENCOUNTER — Other Ambulatory Visit: Payer: Medicare Other

## 2021-10-15 DIAGNOSIS — L608 Other nail disorders: Secondary | ICD-10-CM | POA: Diagnosis not present

## 2021-10-15 DIAGNOSIS — L814 Other melanin hyperpigmentation: Secondary | ICD-10-CM | POA: Diagnosis not present

## 2021-10-15 DIAGNOSIS — L600 Ingrowing nail: Secondary | ICD-10-CM

## 2021-10-15 DIAGNOSIS — L603 Nail dystrophy: Secondary | ICD-10-CM | POA: Diagnosis not present

## 2021-10-15 DIAGNOSIS — B351 Tinea unguium: Secondary | ICD-10-CM

## 2021-10-15 NOTE — Patient Instructions (Signed)

## 2021-10-15 NOTE — Progress Notes (Signed)
Subjective: Chief Complaint  Patient presents with   Ingrown Toenail    BILateral ingrown removal on the MEDIAL borders     41 year old female presents with above complaints.  She states that the nail borders are still causing discomfort she tried to cut the but still having discomfort.  No swelling redness or drainage.  No open lesions.  No other concerns.  Objective: AAO x3, NAD DP/PT pulses palpable bilaterally, CRT less than 3 seconds Incurvation present lateral aspect of bilateral hallux toenails.  Tenderness palpation localized edema there is no erythema, drainage or pus or signs of infection.  The nails in general hypertrophic, dystrophic.  Appears to have mild discoloration to the toenails. No pain with calf compression, swelling, warmth, erythema  Assessment: Ingrown toenails, onychomycosis  Plan: -All treatment options discussed with the patient including all alternatives, risks, complications.  At this time, the patient is requesting partial nail removal with chemical matricectomy to the symptomatic portion of the nail. Risks and complications were discussed with the patient for which they understand and written consent was obtained. Under sterile conditions a total of 3 mL of a mixture of 2% lidocaine plain and 0.5% Marcaine plain was infiltrated in a hallux block fashion. Once anesthetized, the skin was prepped in sterile fashion. A tourniquet was then applied. Next the medial aspect of hallux nail border was then sharply excised making sure to remove the entire offending nail border. Once the nails were ensured to be removed area was debrided and the underlying skin was intact. There is no purulence identified in the procedure. Next phenol was then applied under standard conditions and copiously irrigated. Silvadene was applied. A dry sterile dressing was applied. After application of the dressing the tourniquet was removed and there is found to be an immediate capillary refill time  to the digit. The patient tolerated the procedure well any complications. Post procedure instructions were discussed the patient for which he verbally understood. Discussed signs/symptoms of infection and directed to call the office immediately should any occur or go directly to the emergency room. In the meantime, encouraged to call the office with any questions, concerns, changes symptoms. -Nail sent for pathology to [facility]. -Patient encouraged to call the office with any questions, concerns, change in symptoms.   Vivi Barrack DPM

## 2021-10-20 ENCOUNTER — Telehealth: Payer: Self-pay | Admitting: Orthopaedic Surgery

## 2021-10-20 NOTE — Telephone Encounter (Signed)
Ok to write? 

## 2021-10-20 NOTE — Telephone Encounter (Signed)
Pt called and states she needs a note stating she was under XU care since the beginning of June.   CB [phone]

## 2021-10-20 NOTE — Telephone Encounter (Signed)
yes

## 2021-10-21 NOTE — Telephone Encounter (Signed)
Letter completed and sent to pt's mychart 

## 2021-10-21 NOTE — Telephone Encounter (Signed)
Sent my chart message advising.  

## 2021-10-22 ENCOUNTER — Telehealth: Payer: Self-pay | Admitting: Podiatry

## 2021-10-22 DIAGNOSIS — Z789 Other specified health status: Secondary | ICD-10-CM | POA: Diagnosis not present

## 2021-10-22 DIAGNOSIS — L2089 Other atopic dermatitis: Secondary | ICD-10-CM | POA: Diagnosis not present

## 2021-10-22 DIAGNOSIS — L658 Other specified nonscarring hair loss: Secondary | ICD-10-CM | POA: Diagnosis not present

## 2021-10-22 NOTE — Telephone Encounter (Signed)
Left a detailed message in mychart and left  voicemail for the patient to call the office back as well. I will be out of the office tomorrow.

## 2021-10-22 NOTE — Telephone Encounter (Signed)
Pt had ingrowns removed on 8/3; she would like to know when can the bandages be removed. Please advise.

## 2021-10-26 ENCOUNTER — Ambulatory Visit (INDEPENDENT_AMBULATORY_CARE_PROVIDER_SITE_OTHER): Payer: Medicare Other | Admitting: Podiatry

## 2021-10-26 ENCOUNTER — Ambulatory Visit: Payer: Medicare Other

## 2021-10-26 DIAGNOSIS — L600 Ingrowing nail: Secondary | ICD-10-CM | POA: Diagnosis not present

## 2021-10-26 NOTE — Progress Notes (Unsigned)
Subjective: Chief Complaint  Patient presents with   Ingrown Toenail    Nail check, bilateral ingrown removal, pt is doing well having some pain on the right hallux     41 year old female with above complaints.  She states that she is doing well.  She has some discomfort in the right big toe.  No swelling redness or any drainage.  She is still soaking in Epsom salts, antibiotic ointment and a bandage.  Objective: AAO x3, NAD DP/PT pulses palpable bilaterally, CRT less than 3 seconds Status post bilateral hallux partial nail avulsions.  On the right side there is still granulation tissue present on the nail border but there is no drainage or pus.  Trace edema there is no erythema or warmth bilaterally.  No ascending cellulitis.  No evidence of any remaining toenail on the corners at this time. No pain with calf compression, swelling, warmth, erythema  Assessment: Ingrown toenail status post partial nail avulsion with chemical matricectomy  Plan: -All treatment options discussed with the patient including all alternatives, risks, complications.  -Recommend continue soaking Epsom salts, antibiotic ointment and a bandage in the day but can leave the area open at nighttime.  Monitor for any signs or symptoms of infection or reoccurrence.  Continue soaking and covering to the area is completely healed. -Patient encouraged to call the office with any questions, concerns, change in symptoms.   Vivi Barrack DPM

## 2021-10-26 NOTE — Patient Instructions (Signed)

## 2021-10-29 ENCOUNTER — Ambulatory Visit (INDEPENDENT_AMBULATORY_CARE_PROVIDER_SITE_OTHER): Payer: Medicare Other

## 2021-10-29 DIAGNOSIS — Z23 Encounter for immunization: Secondary | ICD-10-CM

## 2021-10-29 NOTE — Progress Notes (Signed)
Patient presents to nurse clinic for HPV vaccination. Discussed vaccination with Dr. Perley Jain on Monday afternoon. Received verbal order to administer vaccination. Unfortunately, we did not have stock of vaccination on Monday, patient scheduled for today to receive HPV vaccination. Patient also checked with her insurance to ensure they would cover this vaccine.   Administered in LD, site unremarkable, tolerated injection well.   Scheduled patient for final dose on 11/26/21.  Veronda Prude, RN

## 2021-11-06 ENCOUNTER — Other Ambulatory Visit: Payer: Self-pay | Admitting: Family Medicine

## 2021-11-06 DIAGNOSIS — K219 Gastro-esophageal reflux disease without esophagitis: Secondary | ICD-10-CM

## 2021-11-19 DIAGNOSIS — R350 Frequency of micturition: Secondary | ICD-10-CM | POA: Diagnosis not present

## 2021-11-23 DIAGNOSIS — L309 Dermatitis, unspecified: Secondary | ICD-10-CM | POA: Diagnosis not present

## 2021-11-23 DIAGNOSIS — Z789 Other specified health status: Secondary | ICD-10-CM | POA: Diagnosis not present

## 2021-11-23 DIAGNOSIS — L658 Other specified nonscarring hair loss: Secondary | ICD-10-CM | POA: Diagnosis not present

## 2021-11-26 ENCOUNTER — Ambulatory Visit: Payer: Medicare Other

## 2021-11-26 ENCOUNTER — Encounter: Payer: Self-pay | Admitting: Podiatry

## 2021-12-24 DIAGNOSIS — N8110 Cystocele, unspecified: Secondary | ICD-10-CM | POA: Diagnosis not present

## 2022-01-04 ENCOUNTER — Encounter: Payer: Self-pay | Admitting: Student in an Organized Health Care Education/Training Program

## 2022-01-04 ENCOUNTER — Ambulatory Visit
Payer: Medicare Other | Attending: Student in an Organized Health Care Education/Training Program | Admitting: Student in an Organized Health Care Education/Training Program

## 2022-01-04 VITALS — BP 133/92 | HR 86 | Resp 18 | Ht 66.0 in | Wt 162.0 lb

## 2022-01-04 DIAGNOSIS — M47816 Spondylosis without myelopathy or radiculopathy, lumbar region: Secondary | ICD-10-CM | POA: Insufficient documentation

## 2022-01-04 NOTE — Patient Instructions (Signed)
GENERAL RISKS AND COMPLICATIONS ° °What are the risk, side effects and possible complications? °Generally speaking, most procedures are safe.  However, with any procedure there are risks, side effects, and the possibility of complications.  The risks and complications are dependent upon the sites that are lesioned, or the type of nerve block to be performed.  The closer the procedure is to the spine, the more serious the risks are.  Great care is taken when placing the radio frequency needles, block needles or lesioning probes, but sometimes complications can occur. °Infection: Any time there is an injection through the skin, there is a risk of infection.  This is why sterile conditions are used for these blocks.  There are four possible types of infection. °Localized skin infection. °Central Nervous System Infection-This can be in the form of Meningitis, which can be deadly. °Epidural Infections-This can be in the form of an epidural abscess, which can cause pressure inside of the spine, causing compression of the spinal cord with subsequent paralysis. This would require an emergency surgery to decompress, and there are no guarantees that the patient would recover from the paralysis. °Discitis-This is an infection of the intervertebral discs.  It occurs in about 1% of discography procedures.  It is difficult to treat and it may lead to surgery. ° °      2. Pain: the needles have to go through skin and soft tissues, will cause soreness. °      3. Damage to internal structures:  The nerves to be lesioned may be near blood vessels or   ° other nerves which can be potentially damaged. °      4. Bleeding: Bleeding is more common if the patient is taking blood thinners such as  aspirin, Coumadin, Ticiid, Plavix, etc., or if he/she have some genetic predisposition  such as hemophilia. Bleeding into the spinal canal can cause compression of the spinal  cord with subsequent paralysis.  This would require an emergency  surgery to  decompress and there are no guarantees that the patient would recover from the  paralysis. °      5. Pneumothorax:  Puncturing of a lung is a possibility, every time a needle is introduced in  the area of the chest or upper back.  Pneumothorax refers to free air around the  collapsed lung(s), inside of the thoracic cavity (chest cavity).  Another two possible  complications related to a similar event would include: Hemothorax and Chylothorax.   These are variations of the Pneumothorax, where instead of air around the collapsed  lung(s), you may have blood or chyle, respectively. °      6. Spinal headaches: They may occur with any procedures in the area of the spine. °      7. Persistent CSF (Cerebro-Spinal Fluid) leakage: This is a rare problem, but may occur  with prolonged intrathecal or epidural catheters either due to the formation of a fistulous  track or a dural tear. °      8. Nerve damage: By working so close to the spinal cord, there is always a possibility of  nerve damage, which could be as serious as a permanent spinal cord injury with  paralysis. °      9. Death:  Although rare, severe deadly allergic reactions known as "Anaphylactic  reaction" can occur to any of the medications used. °     10. Worsening of the symptoms:  We can always make thing worse. ° °What are the chances   of something like this happening? Chances of any of this occuring are extremely low.  By statistics, you have more of a chance of getting killed in a motor vehicle accident: while driving to the hospital than any of the above occurring .  Nevertheless, you should be aware that they are possibilities.  In general, it is similar to taking a shower.  Everybody knows that you can slip, hit your head and get killed.  Does that mean that you should not shower again?  Nevertheless always keep in mind that statistics do not mean anything if you happen to be on the wrong side of them.  Even if a procedure has a 1 (one) in a  1,000,000 (million) chance of going wrong, it you happen to be that one..Also, keep in mind that by statistics, you have more of a chance of having something go wrong when taking medications.  Who should not have this procedure? If you are on a blood thinning medication (e.g. Coumadin, Plavix, see list of "Blood Thinners"), or if you have an active infection going on, you should not have the procedure.  If you are taking any blood thinners, please inform your physician.  How should I prepare for this procedure? Do not eat or drink anything at least six hours prior to the procedure. Bring a driver with you .  It cannot be a taxi. Come accompanied by an adult that can drive you back, and that is strong enough to help you if your legs get weak or numb from the local anesthetic. Take all of your medicines the morning of the procedure with just enough water to swallow them. If you have diabetes, make sure that you are scheduled to have your procedure done first thing in the morning, whenever possible. If you have diabetes, take only half of your insulin dose and notify our nurse that you have done so as soon as you arrive at the clinic. If you are diabetic, but only take blood sugar pills (oral hypoglycemic), then do not take them on the morning of your procedure.  You may take them after you have had the procedure. Do not take aspirin or any aspirin-containing medications, at least eleven (11) days prior to the procedure.  They may prolong bleeding. Wear loose fitting clothing that may be easy to take off and that you would not mind if it got stained with Betadine or blood. Do not wear any jewelry or perfume Remove any nail coloring.  It will interfere with some of our monitoring equipment.  NOTE: Remember that this is not meant to be interpreted as a complete list of all possible complications.  Unforeseen problems may occur.  BLOOD THINNERS The following drugs contain aspirin or other products,  which can cause increased bleeding during surgery and should not be taken for 2 weeks prior to and 1 week after surgery.  If you should need take something for relief of minor pain, you may take acetaminophen which is found in Tylenol,m Datril, Anacin-3 and Panadol. It is not blood thinner. The products listed below are.  Do not take any of the products listed below in addition to any listed on your instruction sheet.  A.P.C or A.P.C with Codeine Codeine Phosphate Capsules #3 Ibuprofen Ridaura  ABC compound Congesprin Imuran rimadil  Advil Cope Indocin Robaxisal  Alka-Seltzer Effervescent Pain Reliever and Antacid Coricidin or Coricidin-D  Indomethacin Rufen  Alka-Seltzer plus Cold Medicine Cosprin Ketoprofen S-A-C Tablets  Anacin Analgesic Tablets or Capsules Coumadin   Korlgesic Salflex  Anacin Extra Strength Analgesic tablets or capsules CP-2 Tablets Lanoril Salicylate  Anaprox Cuprimine Capsules Levenox Salocol  Anexsia-D Dalteparin Magan Salsalate  Anodynos Darvon compound Magnesium Salicylate Sine-off  Ansaid Dasin Capsules Magsal Sodium Salicylate  Anturane Depen Capsules Marnal Soma  APF Arthritis pain formula Dewitt's Pills Measurin Stanback  Argesic Dia-Gesic Meclofenamic Sulfinpyrazone  Arthritis Bayer Timed Release Aspirin Diclofenac Meclomen Sulindac  Arthritis pain formula Anacin Dicumarol Medipren Supac  Analgesic (Safety coated) Arthralgen Diffunasal Mefanamic Suprofen  Arthritis Strength Bufferin Dihydrocodeine Mepro Compound Suprol  Arthropan liquid Dopirydamole Methcarbomol with Aspirin Synalgos  ASA tablets/Enseals Disalcid Micrainin Tagament  Ascriptin Doan's Midol Talwin  Ascriptin A/D Dolene Mobidin Tanderil  Ascriptin Extra Strength Dolobid Moblgesic Ticlid  Ascriptin with Codeine Doloprin or Doloprin with Codeine Momentum Tolectin  Asperbuf Duoprin Mono-gesic Trendar  Aspergum Duradyne Motrin or Motrin IB Triminicin  Aspirin plain, buffered or enteric coated  Durasal Myochrisine Trigesic  Aspirin Suppositories Easprin Nalfon Trillsate  Aspirin with Codeine Ecotrin Regular or Extra Strength Naprosyn Uracel  Atromid-S Efficin Naproxen Ursinus  Auranofin Capsules Elmiron Neocylate Vanquish  Axotal Emagrin Norgesic Verin  Azathioprine Empirin or Empirin with Codeine Normiflo Vitamin E  Azolid Emprazil Nuprin Voltaren  Bayer Aspirin plain, buffered or children's or timed BC Tablets or powders Encaprin Orgaran Warfarin Sodium  Buff-a-Comp Enoxaparin Orudis Zorpin  Buff-a-Comp with Codeine Equegesic Os-Cal-Gesic   Buffaprin Excedrin plain, buffered or Extra Strength Oxalid   Bufferin Arthritis Strength Feldene Oxphenbutazone   Bufferin plain or Extra Strength Feldene Capsules Oxycodone with Aspirin   Bufferin with Codeine Fenoprofen Fenoprofen Pabalate or Pabalate-SF   Buffets II Flogesic Panagesic   Buffinol plain or Extra Strength Florinal or Florinal with Codeine Panwarfarin   Buf-Tabs Flurbiprofen Penicillamine   Butalbital Compound Four-way cold tablets Penicillin   Butazolidin Fragmin Pepto-Bismol   Carbenicillin Geminisyn Percodan   Carna Arthritis Reliever Geopen Persantine   Carprofen Gold's salt Persistin   Chloramphenicol Goody's Phenylbutazone   Chloromycetin Haltrain Piroxlcam   Clmetidine heparin Plaquenil   Cllnoril Hyco-pap Ponstel   Clofibrate Hydroxy chloroquine Propoxyphen         Before stopping any of these medications, be sure to consult the physician who ordered them.  Some, such as Coumadin (Warfarin) are ordered to prevent or treat serious conditions such as "deep thrombosis", "pumonary embolisms", and other heart problems.  The amount of time that you may need off of the medication may also vary with the medication and the reason for which you were taking it.  If you are taking any of these medications, please make sure you notify your pain physician before you undergo any procedures.         Moderate Conscious  Sedation, Adult, Care After This sheet gives you information about how to care for yourself after your procedure. Your health care provider may also give you more specific instructions. If you have problems or questions, contact your health care provider. What can I expect after the procedure? After the procedure, it is common to have: Sleepiness for several hours. Impaired judgment for several hours. Difficulty with balance. Vomiting if you eat too soon. Follow these instructions at home: For the time period you were told by your health care provider:     Rest. Do not participate in activities where you could fall or become injured. Do not drive or use machinery. Do not drink alcohol. Do not take sleeping pills or medicines that cause drowsiness. Do not make important decisions or sign legal documents. Do  not take care of children on your own. Eating and drinking  Follow the diet recommended by your health care provider. Drink enough fluid to keep your urine pale yellow. If you vomit: Drink water, juice, or soup when you can drink without vomiting. Make sure you have little or no nausea before eating solid foods. General instructions Take over-the-counter and prescription medicines only as told by your health care provider. Have a responsible adult stay with you for the time you are told. It is important to have someone help care for you until you are awake and alert. Do not smoke. Keep all follow-up visits as told by your health care provider. This is important. Contact a health care provider if: You are still sleepy or having trouble with balance after 24 hours. You feel light-headed. You keep feeling nauseous or you keep vomiting. You develop a rash. You have a fever. You have redness or swelling around the IV site. Get help right away if: You have trouble breathing. You have new-onset confusion at home. Summary After the procedure, it is common to feel sleepy, have  impaired judgment, or feel nauseous if you eat too soon. Rest after you get home. Know the things you should not do after the procedure. Follow the diet recommended by your health care provider and drink enough fluid to keep your urine pale yellow. Get help right away if you have trouble breathing or new-onset confusion at home. This information is not intended to replace advice given to you by your health care provider. Make sure you discuss any questions you have with your health care provider. Document Revised: 06/29/2019 Document Reviewed: 01/25/2019 Elsevier Patient Education  [place]. Radiofrequency Ablation Radiofrequency ablation is a procedure that is performed to relieve pain. The procedure is often used for back, neck, or arm pain. Radiofrequency ablation involves the use of a machine that creates radio waves to make heat. During the procedure, the heat is applied to the nerve that carries the pain signal. The heat damages the nerve and interferes with the pain signal. Pain relief usually starts about 2 weeks after the procedure and lasts for 6 months to 1 year. Tell a health care provider about: Any allergies you have. All medicines you are taking, including vitamins, herbs, eye drops, creams, and over-the-counter medicines. Any problems you or family members have had with anesthetic medicines. Any bleeding problems you have. Any surgeries you have had. Any medical conditions you have. Whether you are pregnant or may be pregnant. What are the risks? Generally, this is a safe procedure. However, problems may occur, including: Pain or soreness at the injection site. Allergic reaction to medicines given during the procedure. Bleeding. Infection at the injection site. Damage to nerves or blood vessels. What happens before the procedure? When to stop eating and drinking Follow instructions from your health care provider about what you may eat and drink before your  procedure. These may include: 8 hours before the procedure Stop eating most foods. Do not eat meat, fried foods, or fatty foods. Eat only light foods, such as toast or crackers. All liquids are okay except energy drinks and alcohol. 6 hours before the procedure Stop eating. Drink only clear liquids, such as water, clear fruit juice, black coffee, plain tea, and sports drinks. Do not drink energy drinks or alcohol. 2 hours before the procedure Stop drinking all liquids. You may be allowed to take medicine with small sips of water. If you do not follow your health  care provider's instructions, your procedure may be delayed or canceled. Medicines Ask your health care provider about: Changing or stopping your regular medicines. This is especially important if you are taking diabetes medicines or blood thinners. Taking medicines such as aspirin and ibuprofen. These medicines can thin your blood. Do not take these medicines unless your health care provider tells you to take them. Taking over-the-counter medicines, vitamins, herbs, and supplements. General instructions Ask your health care provider what steps will be taken to help prevent infection. These steps may include: Removing hair at the procedure site. Washing skin with a germ-killing soap. Taking antibiotic medicine. If you will be going home right after the procedure, plan to have a responsible adult: Take you home from the hospital or clinic. You will not be allowed to drive. Care for you for the time you are told. What happens during the procedure?  You will be awake during the procedure. You will need to be able to talk with the health care provider during the procedure. An IV will be inserted into one of your veins. You will be given one or more of the following: A medicine to help you relax (sedative). A medicine to numb the area (local anesthetic). Your health care provider will insert a radiofrequency needle into the area  to be treated. This is done with the help of fluoroscopy. A wire that carries the radio waves (electrode) will be put through the radiofrequency needle. An electrical pulse will be sent through the electrode to verify the correct nerve that is causing your pain. You will feel a tingling sensation, and you may have muscle twitching. The tissue around the needle tip will be heated by an electric current that comes from the radiofrequency machine. This will numb the nerves. The needle will be removed. A bandage (dressing) will be put on the insertion area. The procedure may vary among health care providers and hospitals. What happens after the procedure? Your blood pressure, heart rate, breathing rate, and blood oxygen level will be monitored until you leave the hospital or clinic. Return to your normal activities as told by your health care provider. Ask your health care provider what activities are safe for you. If you were given a sedative during the procedure, it can affect you for several hours. Do not drive or operate machinery until your health care provider says that it is safe. Summary Radiofrequency ablation is a procedure that is performed to relieve pain. The procedure is often used for back, neck, or arm pain. Radiofrequency ablation involves the use of a machine that creates radio waves to make heat. Plan to have a responsible adult take you home from the hospital or clinic. Do not drive or operate machinery until your health care provider says that it is safe. Return to your normal activities as told by your health care provider. Ask your health care provider what activities are safe for you. This information is not intended to replace advice given to you by your health care provider. Make sure you discuss any questions you have with your health care provider. Document Revised: 08/19/2020 Document Reviewed: 08/19/2020 Elsevier Patient Education  2023 [employer].
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
## 2022-01-04 NOTE — Progress Notes (Signed)
Safety precautions to be maintained throughout the outpatient stay will include: orient to surroundings, keep bed in low position, maintain call bell within reach at all times, provide assistance with transfer out of bed and ambulation.  

## 2022-01-10 ENCOUNTER — Other Ambulatory Visit: Payer: Self-pay

## 2022-01-10 ENCOUNTER — Encounter (HOSPITAL_COMMUNITY): Payer: Self-pay

## 2022-01-10 ENCOUNTER — Emergency Department (HOSPITAL_COMMUNITY): Payer: Medicare Other

## 2022-01-10 ENCOUNTER — Emergency Department (HOSPITAL_COMMUNITY)
Admission: EM | Admit: 2022-01-10 | Discharge: 2022-01-10 | Disposition: A | Discharge status: Home / Self Care | Payer: Medicare Other | Attending: Emergency Medicine | Admitting: Emergency Medicine

## 2022-01-10 DIAGNOSIS — Z20822 Contact with and (suspected) exposure to covid-19: Secondary | ICD-10-CM | POA: Diagnosis not present

## 2022-01-10 DIAGNOSIS — J069 Acute upper respiratory infection, unspecified: Secondary | ICD-10-CM | POA: Diagnosis not present

## 2022-01-10 DIAGNOSIS — R059 Cough, unspecified: Secondary | ICD-10-CM | POA: Diagnosis not present

## 2022-01-10 DIAGNOSIS — R051 Acute cough: Secondary | ICD-10-CM

## 2022-01-10 LAB — SARS CORONAVIRUS 2 BY RT PCR: SARS Coronavirus 2 by RT PCR: NEGATIVE

## 2022-01-10 MED ORDER — BENZONATATE 100 MG PO CAPS: 100.0000 mg | ORAL_CAPSULE | Freq: Three times a day (TID) | ORAL | 0 refills | Status: DC

## 2022-01-10 NOTE — ED Provider Notes (Signed)
[facility] EMERGENCY DEPARTMENT Provider Note   CSN: [id number] Arrival date & time: 01/10/22  1045     History  Chief Complaint  Patient presents with   Cough   Nasal Congestion    [name] is a 41 y.o. female with a past medical history as noted below who presents to the ED due to nasal congestion, fever, chills, and cough x2 days.  Denies sick contacts or known COVID exposures.  She admits to postnasal drip.  She also endorses a headache.  Denies chest pain or shortness of breath.  No abdominal pain, nausea, vomiting, or diarrhea.  Patient states she had a temperature of 101 F yesterday.  No neck stiffness.  Denies urinary symptoms.  History obtained from patient and past medical records. No interpreter used during encounter.       Home Medications Prior to Admission medications   Medication Sig Start Date End Date Taking? Authorizing Provider  benzonatate (TESSALON) 100 MG capsule Take 1 capsule (100 mg total) by mouth every 8 (eight) hours. 01/10/22  Yes Danaiya Steadman C, PA-C  acetaminophen-codeine (TYLENOL #3) 300-30 MG tablet Take 1-2 tablets by mouth every 4 (four) hours as needed. Patient not taking: Reported on 08/25/2021 06/24/21   [provider]  amLODipine (NORVASC) 10 MG tablet Take 1 tablet (10 mg total) by mouth at bedtime. 03/13/21   McDiarmid, Leighton Roach, MD  ARIPiprazole (ABILIFY) 5 MG tablet Take 5 mg by mouth every morning. 04/25/20   [provider]  buPROPion (WELLBUTRIN XL) 150 MG 24 hr tablet Take 150 mg by mouth every morning. 04/15/21   [provider]  cetirizine (ZYRTEC) 10 MG tablet TAKE 1 TABLET BY MOUTH DAILY 07/08/21   McDiarmid, Leighton Roach, MD  cyclobenzaprine (FLEXERIL) 5 MG tablet Take 1-2 tablets (5-10 mg total) by mouth 3 (three) times daily as needed for muscle spasms. 01/15/21   Cristie Hem, PA-C  gabapentin (NEURONTIN) 600 MG tablet TAKE 1 TABLET(600 MG) BY MOUTH THREE TIMES DAILY 10/09/21    Kirsteins, Victorino Sparrow, MD  ketorolac (TORADOL) 15 MG/ML injection Pt may have 15mg  toradol once a week PRN per Dr. 02/20/21   McDiarmid, 14/9/22, MD  mirabegron ER (MYRBETRIQ) 50 MG TB24 tablet Take 1 tablet (50 mg total) by mouth daily. Patient not taking: Reported on 01/04/2022 03/13/21   McDiarmid, 03/15/21, MD  Multiple Vitamins-Minerals (MULTIVITAMIN) tablet Take 1 tablet by mouth daily as needed. 06/27/18   [provider]  naproxen (NAPROSYN) 500 MG tablet Take 1 tablet (500 mg total) by mouth 2 (two) times daily with a meal. Patient not taking: Reported on 08/25/2021 01/15/21   13/3/22, PA-C  omeprazole (PRILOSEC) 20 MG capsule TAKE 1 CAPSULE(20 MG) BY MOUTH DAILY 11/07/21   McDiarmid, 11/09/21, MD  Oxcarbazepine (TRILEPTAL) 300 MG tablet Take 300 mg by mouth 2 (two) times daily. 02/23/21   [provider]  oxybutynin (DITROPAN XL) 10 MG 24 hr tablet Take 1 tablet (10 mg total) by mouth at bedtime. Patient not taking: Reported on 01/04/2022 05/05/21   McDiarmid, 05/07/21, MD  oxybutynin (DITROPAN-XL) 5 MG 24 hr tablet Take 5 mg by mouth daily. Patient not taking: Reported on 08/25/2021 07/08/21   [provider]  predniSONE (STERAPRED UNI-PAK 21 TAB) 10 MG (21) TBPK tablet Take as directed Patient not taking: Reported on 01/04/2022 10/06/21   10/08/21, MD  propranolol (INDERAL) 10 MG tablet SMARTSIG:0.5-1 Tablet(s) By Mouth 1-3  Times Daily PRN 02/23/21   [provider]  traMADol (ULTRAM) 50 MG tablet Take 50 mg by mouth 3 (three) times daily as needed. 05/21/21   [provider]  VRAYLAR 1.5 MG capsule Take 1.5 mg by mouth daily. 02/20/21   [provider]  VYVANSE 70 MG capsule Take 70 mg by mouth every morning. 02/23/21   [provider]  zolpidem (AMBIEN) 10 MG tablet  03/11/21   [provider]      Allergies    Azelastine and Azelastine hcl    Review of Systems   Review of Systems  Constitutional:   Positive for chills and fever.  HENT:  Positive for congestion and sore throat.   Respiratory:  Negative for shortness of breath.   Cardiovascular:  Negative for chest pain.  Gastrointestinal:  Negative for abdominal pain, diarrhea, nausea and vomiting.  Neurological:  Positive for headaches.  All other systems reviewed and are negative.   Physical Exam Updated Vital Signs BP (!) 142/102 (BP Location: Right Arm)   Pulse 80   Temp 98.4 F (36.9 C) (Oral)   Resp 18   Ht 5\' 6"  (1.676 m)   Wt 72.6 kg   LMP 09/05/2016 Comment: tubal ligation  SpO2 94%   BMI 25.82 kg/m  Physical Exam Vitals and nursing note reviewed.  Constitutional:      General: She is not in acute distress.    Appearance: She is not ill-appearing.  HENT:     Head: Normocephalic.  Eyes:     Pupils: Pupils are equal, round, and reactive to light.  Neck:     Comments: No meningismus. Cardiovascular:     Rate and Rhythm: Normal rate and regular rhythm.     Pulses: Normal pulses.     Heart sounds: Normal heart sounds. No murmur heard.    No friction rub. No gallop.  Pulmonary:     Effort: Pulmonary effort is normal.     Breath sounds: Normal breath sounds.     Comments: Respirations equal and unlabored, patient able to speak in full sentences, lungs clear to auscultation bilaterally Abdominal:     General: Abdomen is flat. There is no distension.     Palpations: Abdomen is soft.     Tenderness: There is no abdominal tenderness. There is no guarding or rebound.  Musculoskeletal:        General: Normal range of motion.     Cervical back: Neck supple.  Skin:    General: Skin is warm and dry.  Neurological:     General: No focal deficit present.     Mental Status: She is alert.  Psychiatric:        Mood and Affect: Mood normal.        Behavior: Behavior normal.     ED Results / Procedures / Treatments   Labs (all labs ordered are listed, but only abnormal results are displayed) Labs Reviewed  SARS  CORONAVIRUS 2 BY RT PCR    EKG None  Radiology DG Chest 2 View  Result Date: 01/10/2022 CLINICAL DATA:  Productive cough EXAM: CHEST - 2 VIEW COMPARISON:  10/13/2020 FINDINGS: The heart size and mediastinal contours are within normal limits. Both lungs are clear. The visualized skeletal structures are unremarkable. IMPRESSION: No active cardiopulmonary disease. Electronically Signed   By: 12/13/2020 M.D.   On: 01/10/2022 13:28    Procedures Procedures    Medications Ordered in ED Medications - No data to display  ED  Course/ Medical Decision Making/ A&P                           Medical Decision Making Amount and/or Complexity of Data Reviewed Labs: ordered. Decision-making details documented in ED Course. Radiology: ordered and independent interpretation performed. Decision-making details documented in ED Course.   This patient presents to the ED for concern of nasal congestion,cough , this involves an extensive number of treatment options, and is a complaint that carries with it a high risk of complications and morbidity.  The differential diagnosis includes viral infection, pneumonia, etc  41 year old female presents to the ED due to nasal congestion, chills, cough, and fever x2 days.  No chest pain or shortness of breath.  Upon arrival, patient afebrile, not tachycardic or hypoxic.  Patient in no acute distress.  Benign physical exam.  Lungs clear to auscultation bilaterally.  No meningismus to suggest meningitis.  COVID-negative.  Chest x-ray personally reviewed and interpreted which are negative for signs of pneumonia.  Suspect viral etiology.  Patient discharged with symptomatic treatment.  Instructed patient to follow-up with PCP if symptoms do not improve over the next few days. Strict ED precautions discussed with patient. Patient states understanding and agrees to plan. Patient discharged home in no acute distress and stable vitals        Final Clinical  Impression(s) / ED Diagnoses Final diagnoses:  Upper respiratory tract infection, unspecified type  Acute cough    Rx / DC Orders ED Discharge Orders          Ordered    benzonatate (TESSALON) 100 MG capsule  Every 8 hours        01/10/22 [place], Gaynell Eggleton C, PA-C 01/10/22 1450    Truddie Hidden, MD 01/10/22 1546

## 2022-01-10 NOTE — Discharge Instructions (Signed)
It was a pleasure taking care of you today.  As discussed, your x-ray did not show pneumonia.  Your COVID test is negative.  I suspect you have a viral infection.  I am sending you home with cough medication.  Cough after a viral infection can last up to 3 weeks.  You may take over-the-counter nasal spray for your nasal congestion.  Over-the-counter ibuprofen or Tylenol as needed for fever, headache, and chills.  Follow-up with PCP if symptoms do not improve over the next few days.  Return to the ER for new or worsening symptoms.

## 2022-01-10 NOTE — ED Triage Notes (Signed)
Pt arrived POV from home c/o nasal congestion, chills and a cough since friday. Pt states it is hard to breathe lying down and she can taste the mucus draining down her throat.

## 2022-01-11 ENCOUNTER — Encounter: Payer: Self-pay | Admitting: Family Medicine

## 2022-01-11 ENCOUNTER — Telehealth: Payer: Self-pay

## 2022-01-11 NOTE — Patient Outreach (Signed)
  Care Coordination TOC Note Transition Care Management Follow-up Telephone Call Date of discharge and from where: [facility] 01/10/22 How have you been since you were released from the hospital? "I feel the same, not feeling any better" Any questions or concerns? Yes- Patient reported that she was suppose to get an out of work note for today as the PA did not want her returning to work until 01/12/22.  Contacted [facility] RN team and PCP via message to see if they could assist patient obtain a note for her employer.  Requested someone from office reach out to patient.  Items Reviewed: Did the pt receive and understand the discharge instructions provided? Yes  Medications obtained and verified? Yes  Other? No  Any new allergies since your discharge? No  Dietary orders reviewed? No Do you have support at home? Yes   Home Care and Equipment/Supplies: Were home health services ordered? no If so, what is the name of the agency? N/A  Has the agency set up a time to come to the patient's home? not applicable Were any new equipment or medical supplies ordered?  No What is the name of the medical supply agency? N/A Were you able to get the supplies/equipment? not applicable Do you have any questions related to the use of the equipment or supplies? No  Functional Questionnaire: (I = Independent and D = Dependent) ED visit ADLs: Did not inquire  Bathing/Dressing- n/a  Meal Prep-  n/a  Eating- n/a  Maintaining continence- n/a  Transferring/Ambulation- n/a  Managing Meds- I  Follow up appointments reviewed:  PCP Hospital f/u appt confirmed? No   Specialist Hospital f/u appt confirmed? No   Are transportation arrangements needed? No  If their condition worsens, is the pt aware to call PCP or go to the Emergency Dept.? Yes Was the patient provided with contact information for the PCP's office or ED? Yes Was to pt encouraged to call back with questions or concerns? Yes  SDOH assessments  and interventions completed:   Yes  Care Coordination Interventions Activated:  Yes   Care Coordination Interventions:  PCP follow up appointment requested   Encounter Outcome:  Pt. Visit Completed

## 2022-01-11 NOTE — Progress Notes (Signed)
Work excuse covering 10/29 and 01/11/22.

## 2022-01-13 ENCOUNTER — Telehealth: Payer: Self-pay | Admitting: Orthopaedic Surgery

## 2022-01-13 NOTE — Telephone Encounter (Signed)
Updated forms received. Please advise if patient is now regular work full duty.Last work note from 3/24 states part time for 6 months (ended 10/10). Thank you!

## 2022-01-13 NOTE — Telephone Encounter (Signed)
yes

## 2022-01-14 NOTE — Telephone Encounter (Signed)
Noted for [employer] ?

## 2022-01-21 ENCOUNTER — Ambulatory Visit: Payer: Medicare Other

## 2022-01-27 ENCOUNTER — Encounter: Payer: Self-pay | Admitting: Student in an Organized Health Care Education/Training Program

## 2022-01-27 ENCOUNTER — Other Ambulatory Visit: Payer: Self-pay | Admitting: Family Medicine

## 2022-01-27 ENCOUNTER — Ambulatory Visit
Admission: RE | Admit: 2022-01-27 | Discharge: 2022-01-27 | Disposition: A | Discharge status: Home / Self Care | Payer: Medicare Other | Source: Ambulatory Visit | Attending: Student in an Organized Health Care Education/Training Program | Admitting: Student in an Organized Health Care Education/Training Program

## 2022-01-27 ENCOUNTER — Ambulatory Visit
Payer: Medicare Other | Attending: Student in an Organized Health Care Education/Training Program | Admitting: Student in an Organized Health Care Education/Training Program

## 2022-01-27 DIAGNOSIS — M47816 Spondylosis without myelopathy or radiculopathy, lumbar region: Secondary | ICD-10-CM | POA: Diagnosis not present

## 2022-01-27 DIAGNOSIS — I10 Essential (primary) hypertension: Secondary | ICD-10-CM

## 2022-01-27 MED ORDER — ROPIVACAINE HCL 2 MG/ML IJ SOLN
INTRAMUSCULAR | Status: AC
  Filled 2022-01-27: qty 20

## 2022-01-27 MED ORDER — DEXAMETHASONE SODIUM PHOSPHATE 10 MG/ML IJ SOLN
INTRAMUSCULAR | Status: AC
  Filled 2022-01-27: qty 1

## 2022-01-27 MED ORDER — MIDAZOLAM HCL 5 MG/5ML IJ SOLN
INTRAMUSCULAR | Status: AC
  Filled 2022-01-27: qty 5

## 2022-01-27 MED ORDER — LIDOCAINE HCL 2 % IJ SOLN
INTRAMUSCULAR | Status: AC
  Filled 2022-01-27: qty 20

## 2022-01-27 MED ORDER — LACTATED RINGERS IV SOLN: Freq: Once | INTRAVENOUS | Status: AC

## 2022-01-27 MED ORDER — FENTANYL CITRATE (PF) 100 MCG/2ML IJ SOLN
25.0000 ug | INTRAMUSCULAR | Status: DC | PRN
  Administered 2022-01-27: 100 ug via INTRAVENOUS

## 2022-01-27 MED ORDER — ROPIVACAINE HCL 2 MG/ML IJ SOLN
9.0000 mL | Freq: Once | INTRAMUSCULAR | Status: AC
  Administered 2022-01-27: 9 mL via PERINEURAL

## 2022-01-27 MED ORDER — FENTANYL CITRATE (PF) 100 MCG/2ML IJ SOLN
INTRAMUSCULAR | Status: AC
  Filled 2022-01-27: qty 2

## 2022-01-27 MED ORDER — MIDAZOLAM HCL 5 MG/5ML IJ SOLN
0.5000 mg | Freq: Once | INTRAMUSCULAR | Status: AC
  Administered 2022-01-27: 1 mg via INTRAVENOUS
  Filled 2022-01-27: qty 2

## 2022-01-27 MED ORDER — LIDOCAINE HCL 2 % IJ SOLN
20.0000 mL | Freq: Once | INTRAMUSCULAR | Status: AC
  Administered 2022-01-27: 400 mg

## 2022-01-27 MED ORDER — DEXAMETHASONE SODIUM PHOSPHATE 10 MG/ML IJ SOLN
10.0000 mg | Freq: Once | INTRAMUSCULAR | Status: AC
  Administered 2022-01-27: 10 mg

## 2022-01-27 NOTE — Progress Notes (Signed)
PROVIDER NOTE: Interpretation of information contained herein should be left to medically-trained personnel. Specific patient instructions are provided elsewhere under "Patient Instructions" section of medical record. This document was created in part using STT-dictation technology, any transcriptional errors that may result from this process are unintentional.  Patient: [name] Type: Established DOB: Aug 15, 1980 MRN: [id number] PCP: McDiarmid, Leighton Roach, MD  Service: Procedure DOS: 01/27/2022 Setting: Ambulatory Location: Ambulatory outpatient facility Delivery: Face-to-face Provider: Edward Jolly, MD Specialty: Interventional Pain Management Specialty designation: 09 Location: Outpatient facility Ref. Prov.: Edward Jolly, MD    Procedure:           Type: Lumbar Facet, Medial Branch Radiofrequency Ablation (RFA) #1  Laterality: Left (-LT)  Level: L3, L4, L5, Medial Branch Level(s). These levels will denervate the L3-4, L4-5 and L5-S1 lumbar facet joints.  Imaging: Fluoroscopy-guided         Anesthesia: Local anesthesia (1-2% Lidocaine) Sedation: Moderate sedation DOS: 01/27/2022  Performed by: Edward Jolly, MD  Purpose: Therapeutic/Palliative Indications: Low back pain severe enough to impact quality of life or function. Indications: 1. Spondylosis of lumbar region without myelopathy or radiculopathy   2. Lumbar facet joint syndrome   3. Lumbar spondylosis    Ms. Collinsworth has been dealing with the above chronic pain for longer than three months and has either failed to respond, was unable to tolerate, or simply did not get enough benefit from other more conservative therapies including, but not limited to: 1. Over-the-counter medications 2. Anti-inflammatory medications 3. Muscle relaxants 4. Membrane stabilizers 5. Opioids 6. Physical therapy and/or chiropractic manipulation 7. Modalities (Heat, ice, etc.) 8. Invasive techniques such as nerve blocks. Ms. Contino has attained  more than 50% relief of the pain from a series of diagnostic injections conducted in separate occasions.  Pain Score: Pre-procedure: 9 /10 Post-procedure: 0-No pain/10     Position / Prep / Materials:  Position: Prone  Prep solution: DuraPrep (Iodine Povacrylex [0.7% available iodine] and Isopropyl Alcohol, 74% w/w) Prep Area: Entire Lumbosacral Region (Lower back from mid-thoracic region to end of tailbone and from flank to flank.) Materials:  Tray: RFA (Radiofrequency) tray Needle(s):  Type: RFA (Teflon-coated radiofrequency ablation needles) Gauge (G): 22  Length: Regular (10cm) Qty:3  Pre-op H&P Assessment:  [name] is a 41 y.o. (year old), female patient, seen today for interventional treatment. She  has a past surgical history that includes Strabismus surgery (08/1999); Colonoscopy (06/2006); Esophagogastroduodenoscopy (06/2006); Tubal ligation (04/08/2007); Dilation and evacuation (10/14/2011); Shoulder arthroscopy (01/21/2012); Exam under anesthesia with manipulation of shoulder (01/21/2012); Laparoscopic bilateral salpingectomy (N/A, 03/01/2013); Muscle recession and resection (Right, 10/03/2013); and Eye surgery (Left, 06/2021). [name] has a current medication list which includes the following prescription(s): acetaminophen-codeine, amlodipine, aripiprazole, benzonatate, bupropion, cetirizine, cyclobenzaprine, gabapentin, ketorolac, mirabegron er, multivitamin, naproxen, omeprazole, oxcarbazepine, oxybutynin, prednisone, propranolol, vraylar, vyvanse, zolpidem, oxybutynin, and tramadol, and the following Facility-Administered Medications: fentanyl. Her primarily concern today is the Back Pain (lower)  Initial Vital Signs:  Pulse/HCG Rate: 73ECG Heart Rate: 69 (NSR) Temp: 98.1 F (36.7 C) Resp: 13 BP: (!) 135/94 SpO2: 100 %  BMI: Estimated body mass index is 25.82 kg/m as calculated from the following:   Height as of this encounter: 5\' 6"  (1.676 m).   Weight as of this  encounter: 160 lb (72.6 kg).  Risk Assessment: Allergies: Reviewed. She is allergic to azelastine and azelastine hcl.  Allergy Precautions: None required Coagulopathies: Reviewed. None identified.  Blood-thinner therapy: None at this time Active Infection(s): Reviewed. None identified. [name]  is afebrile  Site Confirmation: [name] was asked to confirm the procedure and laterality before marking the site Procedure checklist: Completed Consent: Before the procedure and under the influence of no sedative(s), amnesic(s), or anxiolytics, the patient was informed of the treatment options, risks and possible complications. To fulfill our ethical and legal obligations, as recommended by the American Medical Association's Code of Ethics, I have informed the patient of my clinical impression; the nature and purpose of the treatment or procedure; the risks, benefits, and possible complications of the intervention; the alternatives, including doing nothing; the risk(s) and benefit(s) of the alternative treatment(s) or procedure(s); and the risk(s) and benefit(s) of doing nothing. The patient was provided information about the general risks and possible complications associated with the procedure. These may include, but are not limited to: failure to achieve desired goals, infection, bleeding, organ or nerve damage, allergic reactions, paralysis, and death. In addition, the patient was informed of those risks and complications associated to Spine-related procedures, such as failure to decrease pain; infection (i.e.: Meningitis, epidural or intraspinal abscess); bleeding (i.e.: epidural hematoma, subarachnoid hemorrhage, or any other type of intraspinal or peri-dural bleeding); organ or nerve damage (i.e.: Any type of peripheral nerve, nerve root, or spinal cord injury) with subsequent damage to sensory, motor, and/or autonomic systems, resulting in permanent pain, numbness, and/or weakness of one or several  areas of the body; allergic reactions; (i.e.: anaphylactic reaction); and/or death. Furthermore, the patient was informed of those risks and complications associated with the medications. These include, but are not limited to: allergic reactions (i.e.: anaphylactic or anaphylactoid reaction(s)); adrenal axis suppression; blood sugar elevation that in diabetics may result in ketoacidosis or comma; water retention that in patients with history of congestive heart failure may result in shortness of breath, pulmonary edema, and decompensation with resultant heart failure; weight gain; swelling or edema; medication-induced neural toxicity; particulate matter embolism and blood vessel occlusion with resultant organ, and/or nervous system infarction; and/or aseptic necrosis of one or more joints. Finally, the patient was informed that Medicine is not an exact science; therefore, there is also the possibility of unforeseen or unpredictable risks and/or possible complications that may result in a catastrophic outcome. The patient indicated having understood very clearly. We have given the patient no guarantees and we have made no promises. Enough time was given to the patient to ask questions, all of which were answered to the patient's satisfaction. Ms. Sweeting has indicated that she wanted to continue with the procedure. Attestation: I, the ordering provider, attest that I have discussed with the patient the benefits, risks, side-effects, alternatives, likelihood of achieving goals, and potential problems during recovery for the procedure that I have provided informed consent. Date  Time: 01/27/2022  7:55 AM  Pre-Procedure Preparation:  Monitoring: As per clinic protocol. Respiration, ETCO2, SpO2, BP, heart rate and rhythm monitor placed and checked for adequate function Safety Precautions: Patient was assessed for positional comfort and pressure points before starting the procedure. Time-out: I initiated and  conducted the "Time-out" before starting the procedure, as per protocol. The patient was asked to participate by confirming the accuracy of the "Time Out" information. Verification of the correct person, site, and procedure were performed and confirmed by me, the nursing staff, and the patient. "Time-out" conducted as per Joint Commission's Universal Protocol (UP.01.01.01). Time: 0837  Description of Procedure:          Laterality: Left Levels: L3, L4, L5, Medial Branch Level(s). Safety Precautions: Aspiration looking for blood return  was conducted prior to all injections. At no point did we inject any substances, as a needle was being advanced. Before injecting, the patient was told to immediately notify me if she was experiencing any new onset of "ringing in the ears, or metallic taste in the mouth". No attempts were made at seeking any paresthesias. Safe injection practices and needle disposal techniques used. Medications properly checked for expiration dates. SDV (single dose vial) medications used. After the completion of the procedure, all disposable equipment used was discarded in the proper designated medical waste containers. Local Anesthesia: Protocol guidelines were followed. The patient was positioned over the fluoroscopy table. The area was prepped in the usual manner. The time-out was completed. The target area was identified using fluoroscopy. A 12-in long, straight, sterile hemostat was used with fluoroscopic guidance to locate the targets for each level blocked. Once located, the skin was marked with an approved surgical skin marker. Once all sites were marked, the skin (epidermis, dermis, and hypodermis), as well as deeper tissues (fat, connective tissue and muscle) were infiltrated with a small amount of a short-acting local anesthetic, loaded on a 10cc syringe with a 25G, 1.5-in  Needle. An appropriate amount of time was allowed for local anesthetics to take effect before proceeding to the  next step. Technical description of process:  Radiofrequency Ablation (RFA)  L3 Medial Branch Nerve RFA: The target area for the L3 medial branch is at the junction of the postero-lateral aspect of the superior articular process and the superior, posterior, and medial edge of the transverse process of L4. Under fluoroscopic guidance, a Radiofrequency needle was inserted until contact was made with os over the superior postero-lateral aspect of the pedicular shadow (target area). Sensory and motor testing was conducted to properly adjust the position of the needle. Once satisfactory placement of the needle was achieved, the numbing solution was slowly injected after negative aspiration for blood. 2.0 mL of the nerve block solution was injected without difficulty or complication. After waiting for at least 3 minutes, the ablation was performed. Once completed, the needle was removed intact. L4 Medial Branch Nerve RFA: The target area for the L4 medial branch is at the junction of the postero-lateral aspect of the superior articular process and the superior, posterior, and medial edge of the transverse process of L5. Under fluoroscopic guidance, a Radiofrequency needle was inserted until contact was made with os over the superior postero-lateral aspect of the pedicular shadow (target area). Sensory and motor testing was conducted to properly adjust the position of the needle. Once satisfactory placement of the needle was achieved, the numbing solution was slowly injected after negative aspiration for blood. 2.0 mL of the nerve block solution was injected without difficulty or complication. After waiting for at least 3 minutes, the ablation was performed. Once completed, the needle was removed intact. L5 Medial Branch Nerve RFA: The target area for the L5 medial branch is at the junction of the postero-lateral aspect of the superior articular process of S1 and the superior, posterior, and medial edge of the sacral  ala. Under fluoroscopic guidance, a Radiofrequency needle was inserted until contact was made with os over the superior postero-lateral aspect of the pedicular shadow (target area). Sensory and motor testing was conducted to properly adjust the position of the needle. Once satisfactory placement of the needle was achieved, the numbing solution was slowly injected after negative aspiration for blood. 2.0 mL of the nerve block solution was injected without difficulty or complication. After waiting  for at least 3 minutes, the ablation was performed. Once completed, the needle was removed intact.  Radiofrequency lesioning (ablation):  Radiofrequency Generator: Medtronic AccurianTM AG 1000 RF Generator Sensory Stimulation Parameters: 50 Hz was used to locate & identify the nerve, making sure that the needle was positioned such that there was no sensory stimulation below 0.3 V or above 0.7 V. Motor Stimulation Parameters: 2 Hz was used to evaluate the motor component. Care was taken not to lesion any nerves that demonstrated motor stimulation of the lower extremities at an output of less than 2.5 times that of the sensory threshold, or a maximum of 2.0 V. Lesioning Technique Parameters: Standard Radiofrequency settings. (Not bipolar or pulsed.) Temperature Settings: 80 degrees C Lesioning time: 60 seconds Intra-operative Compliance: Compliant   6 cc solution made of 5 cc of 0.2% ropivacaine, 1 cc of Decadron 10 mg/cc.  2 cc injected at this level above on the left after sensorimotor testing, prior to lesioning.   Once the entire procedure was completed, the treated area was cleaned, making sure to leave some of the prepping solution back to take advantage of its long term bactericidal properties.    Illustration of the posterior view of the lumbar spine and the posterior neural structures. Laminae of L2 through S1 are labeled. DPRL5, dorsal primary ramus of L5; DPRS1, dorsal primary ramus of S1; DPR3,  dorsal primary ramus of L3; FJ, facet (zygapophyseal) joint L3-L4; I, inferior articular process of L4; LB1, lateral branch of dorsal primary ramus of L1; IAB, inferior articular branches from L3 medial branch (supplies L4-L5 facet joint); IBP, intermediate branch plexus; MB3, medial branch of dorsal primary ramus of L3; NR3, third lumbar nerve root; S, superior articular process of L5; SAB, superior articular branches from L4 (supplies L4-5 facet joint also); TP3, transverse process of L3.  Vitals:   01/27/22 0855 01/27/22 0902 01/27/22 0913 01/27/22 0923  BP: (!) 147/110 (!) 146/105 (!) 146/106 (!) 154/109  Pulse:      Resp: Temp:  98.2 F (36.8 C)    TempSrc:      SpO2: 99% 100% 99% 99%  Weight:      Height:        Start Time: 0837 hrs. End Time: 0855 hrs.  Imaging Guidance (Spinal):          Type of Imaging Technique: Fluoroscopy Guidance (Spinal) Indication(s): Assistance in needle guidance and placement for procedures requiring needle placement in or near specific anatomical locations not easily accessible without such assistance. Exposure Time: Please see nurses notes. Contrast: None used. Fluoroscopic Guidance: I was personally present during the use of fluoroscopy. "Tunnel Vision Technique" used to obtain the best possible view of the target area. Parallax error corrected before commencing the procedure. "Direction-depth-direction" technique used to introduce the needle under continuous pulsed fluoroscopy. Once target was reached, antero-posterior, oblique, and lateral fluoroscopic projection used confirm needle placement in all planes. Images permanently stored in EMR. Interpretation: No contrast injected. I personally interpreted the imaging intraoperatively. Adequate needle placement confirmed in multiple planes. Permanent images saved into the patient's record.  Antibiotic Prophylaxis:   Anti-infectives (From admission, onward)    None      Indication(s):  None identified  Post-operative Assessment:  Post-procedure Vital Signs:  Pulse/HCG Rate: 7360 Temp: 98.2 F (36.8 C) Resp: 16 BP: (!) 154/109 SpO2: 99 %  EBL: None  Complications: No immediate post-treatment complications observed by team, or reported by patient.  Note: The patient  tolerated the entire procedure well. A repeat set of vitals were taken after the procedure and the patient was kept under observation following institutional policy, for this type of procedure. Post-procedural neurological assessment was performed, showing return to baseline, prior to discharge. The patient was provided with post-procedure discharge instructions, including a section on how to identify potential problems. Should any problems arise concerning this procedure, the patient was given instructions to immediately contact us, at any time, without hesitation. In any case, we plan to contact the patient by telephone for a follow-up status report regarding this interventional procedure.  Comments:  No additional relevant information.  Plan of Care  Orders:  Orders Placed This Encounter  Procedures   DG PAIN CLIN[facility] C-ARM 1-60 MIN NO REPORT    Intraoperative interpretation by procedural physician at [facility].    Standing Status:   Standing    Number of Occurrences:   1    Order Specific Question:   Reason for exam:    Answer:   Assistance in needle guidance and placement for procedures requiring needle placement in or near specific anatomical locations not easily accessible without such assistance.     Medications ordered for procedure: Meds ordered this encounter  Medications   lidocaine (XYLOCAINE) 2 % (with pres) injection 400 mg   lactated ringers infusion   midazolam (VERSED) 5 MG/5ML injection 0.5-2 mg    Make sure Flumazenil is available in the pyxis when using this medication. If oversedation occurs, administer 0.2 mg IV over 15 sec. If after 45 sec no response, administer  0.2 mg again over 1 min; may repeat at 1 min intervals; not to exceed 4 doses (1 mg)   fentaNYL (SUBLIMAZE) injection 25-50 mcg    Make sure Narcan is available in the pyxis when using this medication. In the event of respiratory depression (RR< 8/min): Titrate NARCAN (naloxone) in increments of 0.1 to 0.2 mg IV at 2-3 minute intervals, until desired degree of reversal.   ropivacaine (PF) 2 mg/mL (0.2%) (NAROPIN) injection 9 mL   dexamethasone (DECADRON) injection 10 mg   Medications administered: We administered lidocaine, lactated ringers, midazolam, fentaNYL, ropivacaine (PF) 2 mg/mL (0.2%), and dexamethasone.  See the medical record for exact dosing, route, and time of administration.  Follow-up plan:   Return in about 6 weeks (around 03/10/2022) for Post Procedure Evaluation, in person.       Left L3,4,5 RFA 01/27/22   Recent Visits Date Type Provider Dept  01/04/22 Office Visit Edward JollyLateef, Takeya Marquis, MD [facility]  Showing recent visits within past 90 days and meeting all other requirements Today's Visits Date Type Provider Dept  01/27/22 Procedure visit Edward JollyLateef, Delania Ferg, MD [facility]  Showing today's visits and meeting all other requirements Future Appointments Date Type Provider Dept  03/10/22 Appointment Edward JollyLateef, Erin Obando, MD [facility]  Showing future appointments within next 90 days and meeting all other requirements  Disposition: Discharge home  Discharge (Date  Time): 01/27/2022; 0930 hrs.   Primary Care Physician: McDiarmid, Leighton Roachodd D, MD [facility] Note by: Edward JollyBilal Demarie Hyneman, MD Date: 01/27/2022; Time: 10:26 AM  Disclaimer:  Medicine is not an exact science. The only guarantee in medicine is that nothing is guaranteed. It is important to note that the decision to proceed with this intervention was based on the information collected from the patient. The Data and conclusions were drawn from the patient's  questionnaire, the interview, and the physical examination. Because the information was  provided in large part by the patient, it cannot be guaranteed that it has not been purposely or unconsciously manipulated. Every effort has been made to obtain as much relevant data as possible for this evaluation. It is important to note that the conclusions that lead to this procedure are derived in large part from the available data. Always take into account that the treatment will also be dependent on availability of resources and existing treatment guidelines, considered by other Pain Management Practitioners as being common knowledge and practice, at the time of the intervention. For Medico-Legal purposes, it is also important to point out that variation in procedural techniques and pharmacological choices are the acceptable norm. The indications, contraindications, technique, and results of the above procedure should only be interpreted and judged by a Board-Certified Interventional Pain Specialist with extensive familiarity and expertise in the same exact procedure and technique.

## 2022-01-27 NOTE — Patient Instructions (Signed)

## 2022-01-27 NOTE — Progress Notes (Signed)
Safety precautions to be maintained throughout the outpatient stay will include: orient to surroundings, keep bed in low position, maintain call bell within reach at all times, provide assistance with transfer out of bed and ambulation.  

## 2022-01-28 ENCOUNTER — Telehealth: Payer: Self-pay | Admitting: *Deleted

## 2022-01-28 NOTE — Telephone Encounter (Signed)
No problems post procedure. 

## 2022-03-10 ENCOUNTER — Ambulatory Visit: Payer: Medicare Other | Admitting: Student in an Organized Health Care Education/Training Program

## 2022-03-19 ENCOUNTER — Encounter (HOSPITAL_COMMUNITY): Payer: Self-pay | Admitting: Emergency Medicine

## 2022-03-19 ENCOUNTER — Ambulatory Visit (HOSPITAL_COMMUNITY)
Admission: EM | Admit: 2022-03-19 | Discharge: 2022-03-19 | Disposition: A | Discharge status: Home / Self Care | Payer: Medicare Other | Attending: Emergency Medicine | Admitting: Emergency Medicine

## 2022-03-19 DIAGNOSIS — J111 Influenza due to unidentified influenza virus with other respiratory manifestations: Secondary | ICD-10-CM

## 2022-03-19 DIAGNOSIS — Z1152 Encounter for screening for COVID-19: Secondary | ICD-10-CM | POA: Insufficient documentation

## 2022-03-19 DIAGNOSIS — J0140 Acute pansinusitis, unspecified: Secondary | ICD-10-CM | POA: Insufficient documentation

## 2022-03-19 DIAGNOSIS — J4521 Mild intermittent asthma with (acute) exacerbation: Secondary | ICD-10-CM | POA: Diagnosis not present

## 2022-03-19 LAB — POC INFLUENZA A AND B ANTIGEN (URGENT CARE ONLY)
INFLUENZA A ANTIGEN, POC: NEGATIVE
INFLUENZA B ANTIGEN, POC: NEGATIVE

## 2022-03-19 MED ORDER — IPRATROPIUM BROMIDE 0.06 % NA SOLN: 2.0000 | Freq: Four times a day (QID) | NASAL | 0 refills | Status: DC

## 2022-03-19 MED ORDER — ALBUTEROL SULFATE HFA 108 (90 BASE) MCG/ACT IN AERS: 1.0000 | INHALATION_SPRAY | RESPIRATORY_TRACT | 0 refills | Status: AC | PRN

## 2022-03-19 MED ORDER — PROMETHAZINE-DM 6.25-15 MG/5ML PO SYRP: 5.0000 mL | ORAL_SOLUTION | Freq: Four times a day (QID) | ORAL | 0 refills | Status: DC | PRN

## 2022-03-19 MED ORDER — PREDNISONE 20 MG PO TABS: 40.0000 mg | ORAL_TABLET | Freq: Every day | ORAL | 0 refills | Status: AC

## 2022-03-19 MED ORDER — AEROCHAMBER MV MISC: 1 refills | Status: DC

## 2022-03-19 MED ORDER — OSELTAMIVIR PHOSPHATE 75 MG PO CAPS: 75.0000 mg | ORAL_CAPSULE | Freq: Two times a day (BID) | ORAL | 0 refills | Status: DC

## 2022-03-19 NOTE — Discharge Instructions (Signed)
2 puffs from your albuterol inhaler with your spacer every 4 hours for 2 days, then every 6 hours for 2 days, then as needed.  You can back off on the albuterol if you start to improve sooner.  I am starting you on Tamiflu today in case this is influenza.  Will contact you if your influenza or COVID, positive.  For COVID comes back positive.  We will prescribe molnupiravir.  You will need to stop Tamiflu.  prednisone 40 mg for 5 days, Atrovent nasal spray, Mucinex, saline nasal irrigation with a NeilMed sinus rinse and distilled water as often as you want.  Will help prevent a bacterial sinus infection and make the Atrovent nasal spray more effective.  Promethazine DM as needed for cough.
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
## 2022-03-19 NOTE — ED Triage Notes (Signed)
Woke up at 0200 "feeling like someone had jumped on me." Has a cough that burns and feels SOB. Hx of asthma. Reports chills, sore throat, headache, body aches, nausea. Denies V/D, ear pain. Taking tessalon pears and tylenol with no improvement of symptoms.

## 2022-03-20 LAB — SARS CORONAVIRUS 2 (TAT 6-24 HRS): SARS Coronavirus 2: NEGATIVE

## 2022-03-23 ENCOUNTER — Telehealth: Payer: Self-pay

## 2022-03-23 NOTE — Telephone Encounter (Signed)
Lovena Le law clerk with [employer] ([employer]) calls nurse line checking the status of several medical records requests.   He reports one request was sent in November and a FU in December.   Will forward to medical records coordinator.   If you need to reach [place] ext [phone]

## 2022-03-30 ENCOUNTER — Telehealth: Payer: Self-pay | Admitting: Family Medicine

## 2022-03-30 NOTE — Telephone Encounter (Signed)
Left message for patient to call back and schedule Medicare Annual Wellness Visit (AWV) in office.   If not able to come in office, please offer to do virtually or by telephone.  Left office number and my jabber [phone].  AWVI eligible as of :11/13/2017  Please schedule at anytime with Nurse Health Advisor.

## 2022-04-08 DIAGNOSIS — R519 Headache, unspecified: Secondary | ICD-10-CM | POA: Diagnosis not present

## 2022-04-08 DIAGNOSIS — M9902 Segmental and somatic dysfunction of thoracic region: Secondary | ICD-10-CM | POA: Diagnosis not present

## 2022-04-08 DIAGNOSIS — M5414 Radiculopathy, thoracic region: Secondary | ICD-10-CM | POA: Diagnosis not present

## 2022-04-08 DIAGNOSIS — M9903 Segmental and somatic dysfunction of lumbar region: Secondary | ICD-10-CM | POA: Diagnosis not present

## 2022-04-08 DIAGNOSIS — M9901 Segmental and somatic dysfunction of cervical region: Secondary | ICD-10-CM | POA: Diagnosis not present

## 2022-04-08 DIAGNOSIS — M5417 Radiculopathy, lumbosacral region: Secondary | ICD-10-CM | POA: Diagnosis not present

## 2022-06-25 ENCOUNTER — Telehealth: Payer: Self-pay | Admitting: Family Medicine

## 2022-06-25 NOTE — Telephone Encounter (Signed)
Contacted [name] to schedule their annual wellness visit. Appointment made for 06/29/2022.  Thank you,  [facility] Support [facility] Direct dial  [phone]

## 2022-06-28 NOTE — Patient Instructions (Signed)
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
## 2022-06-29 ENCOUNTER — Ambulatory Visit (INDEPENDENT_AMBULATORY_CARE_PROVIDER_SITE_OTHER): Payer: Medicaid Other

## 2022-06-29 DIAGNOSIS — Z0000 Encounter for general adult medical examination without abnormal findings: Secondary | ICD-10-CM

## 2022-07-05 ENCOUNTER — Emergency Department (HOSPITAL_COMMUNITY): Payer: No Typology Code available for payment source

## 2022-07-05 ENCOUNTER — Emergency Department (HOSPITAL_COMMUNITY)
Admission: EM | Admit: 2022-07-05 | Discharge: 2022-07-05 | Disposition: A | Discharge status: Home / Self Care | Payer: No Typology Code available for payment source | Attending: Emergency Medicine | Admitting: Emergency Medicine

## 2022-07-05 ENCOUNTER — Encounter (HOSPITAL_COMMUNITY): Payer: Self-pay

## 2022-07-05 ENCOUNTER — Other Ambulatory Visit: Payer: Self-pay

## 2022-07-05 DIAGNOSIS — S01511A Laceration without foreign body of lip, initial encounter: Secondary | ICD-10-CM | POA: Diagnosis not present

## 2022-07-05 DIAGNOSIS — Z23 Encounter for immunization: Secondary | ICD-10-CM | POA: Diagnosis not present

## 2022-07-05 DIAGNOSIS — R0781 Pleurodynia: Secondary | ICD-10-CM | POA: Insufficient documentation

## 2022-07-05 DIAGNOSIS — R1010 Upper abdominal pain, unspecified: Secondary | ICD-10-CM | POA: Diagnosis not present

## 2022-07-05 DIAGNOSIS — S0181XA Laceration without foreign body of other part of head, initial encounter: Secondary | ICD-10-CM | POA: Diagnosis not present

## 2022-07-05 DIAGNOSIS — Y92410 Unspecified street and highway as the place of occurrence of the external cause: Secondary | ICD-10-CM | POA: Diagnosis not present

## 2022-07-05 LAB — CBC WITH DIFFERENTIAL/PLATELET
Abs Immature Granulocytes: 0.03 10*3/uL (ref 0.00–0.07)
Basophils Absolute: 0 10*3/uL (ref 0.0–0.1)
Basophils Relative: 0 %
Eosinophils Absolute: 0 10*3/uL (ref 0.0–0.5)
Eosinophils Relative: 0 %
HCT: 44 % (ref 36.0–46.0)
Hemoglobin: 15.1 g/dL — ABNORMAL HIGH (ref 12.0–15.0)
Immature Granulocytes: 0 %
Lymphocytes Relative: 24 %
Lymphs Abs: 2.6 10*3/uL (ref 0.7–4.0)
MCH: 31.5 pg (ref 26.0–34.0)
MCHC: 34.3 g/dL (ref 30.0–36.0)
MCV: 91.7 fL (ref 80.0–100.0)
Monocytes Absolute: 0.5 10*3/uL (ref 0.1–1.0)
Monocytes Relative: 5 %
Neutro Abs: 7.7 10*3/uL (ref 1.7–7.7)
Neutrophils Relative %: 71 %
Platelets: 306 10*3/uL (ref 150–400)
RBC: 4.8 MIL/uL (ref 3.87–5.11)
RDW: 13.6 % (ref 11.5–15.5)
WBC: 10.9 10*3/uL — ABNORMAL HIGH (ref 4.0–10.5)
nRBC: 0 % (ref 0.0–0.2)

## 2022-07-05 LAB — I-STAT CHEM 8, ED
BUN: 7 mg/dL (ref 6–20)
Calcium, Ion: 1.12 mmol/L — ABNORMAL LOW (ref 1.15–1.40)
Chloride: 106 mmol/L (ref 98–111)
Creatinine, Ser: 0.9 mg/dL (ref 0.44–1.00)
Glucose, Bld: 99 mg/dL (ref 70–99)
HCT: 49 % — ABNORMAL HIGH (ref 36.0–46.0)
Hemoglobin: 16.7 g/dL — ABNORMAL HIGH (ref 12.0–15.0)
Potassium: 3.4 mmol/L — ABNORMAL LOW (ref 3.5–5.1)
Sodium: 143 mmol/L (ref 135–145)
TCO2: 25 mmol/L (ref 22–32)

## 2022-07-05 LAB — I-STAT BETA HCG BLOOD, ED (MC, WL, AP ONLY): I-stat hCG, quantitative: <5 m[IU]/mL (ref <5)

## 2022-07-05 MED ORDER — MORPHINE SULFATE (PF) 2 MG/ML IV SOLN
2.0000 mg | Freq: Once | INTRAVENOUS | Status: AC
  Administered 2022-07-05: 2 mg via INTRAVENOUS
  Filled 2022-07-05: qty 1

## 2022-07-05 MED ORDER — METHOCARBAMOL 500 MG PO TABS: 500.0000 mg | ORAL_TABLET | Freq: Two times a day (BID) | ORAL | 0 refills | Status: DC

## 2022-07-05 MED ORDER — ONDANSETRON HCL 4 MG/2ML IJ SOLN
4.0000 mg | Freq: Once | INTRAMUSCULAR | Status: AC
  Administered 2022-07-05: 4 mg via INTRAVENOUS
  Filled 2022-07-05: qty 2

## 2022-07-05 MED ORDER — IOHEXOL 350 MG/ML SOLN
75.0000 mL | Freq: Once | INTRAVENOUS | Status: AC | PRN
  Administered 2022-07-05: 75 mL via INTRAVENOUS

## 2022-07-05 MED ORDER — TETANUS-DIPHTH-ACELL PERTUSSIS 5-2.5-18.5 LF-MCG/0.5 IM SUSY
0.5000 mL | PREFILLED_SYRINGE | Freq: Once | INTRAMUSCULAR | Status: AC
  Administered 2022-07-05: 0.5 mL via INTRAMUSCULAR
  Filled 2022-07-05: qty 0.5

## 2022-07-05 MED ORDER — PENICILLIN V POTASSIUM 500 MG PO TABS: 500.0000 mg | ORAL_TABLET | Freq: Four times a day (QID) | ORAL | 0 refills | Status: AC

## 2022-07-05 MED ORDER — LIDOCAINE HCL (PF) 1 % IJ SOLN
10.0000 mL | Freq: Once | INTRAMUSCULAR | Status: AC
  Administered 2022-07-05: 10 mL
  Filled 2022-07-05: qty 10

## 2022-07-05 NOTE — Discharge Instructions (Addendum)
It was a pleasure taking care of you today!   Your tetanus was updated in the emergency department today.  You will be sent an antibiotic (penicillin) to take for your symptoms. You may return to urgent care or return to the emergency department for suture removal in 5-7 days.  Do not place any lotions, make-up to the area for the next 24-48 hours.  If the area does become wet, dab to dry it.  Keep the area clean and dry.  You be given an incentive spirometer today, it is important that you take 10 deep breaths every hour that you are awake for at least 5 days. You are prescribed Robaxin  Take medication as prescribed.  Do not operate any heavy machinery or drive while taking Robaxin as it can make you sleepy/drowsy.  You may apply ice or heat to the affected area for up to 15 minutes at a time.  Ensure to place a barrier between your skin and the heat.  You may take over-the-counter 500 mg ibuprofen every 6 hours and alternate with 5 mg Tylenol every 6 hours as needed for pain for more than 7 days.  You may follow-up with your primary care provider as needed.  Return to the emergency department if you are experiencing increasing/worsening symptoms.
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
## 2022-07-06 ENCOUNTER — Ambulatory Visit (INDEPENDENT_AMBULATORY_CARE_PROVIDER_SITE_OTHER): Payer: Medicaid Other | Admitting: Student

## 2022-07-06 ENCOUNTER — Telehealth: Payer: Self-pay

## 2022-07-06 ENCOUNTER — Encounter: Payer: Self-pay | Admitting: Student

## 2022-07-06 VITALS — BP 126/93 | HR 90 | Ht 66.0 in | Wt 155.8 lb

## 2022-07-06 DIAGNOSIS — S20211S Contusion of right front wall of thorax, sequela: Secondary | ICD-10-CM | POA: Diagnosis present

## 2022-07-06 DIAGNOSIS — S298XXS Other specified injuries of thorax, sequela: Secondary | ICD-10-CM

## 2022-07-06 MED ORDER — NAPROXEN 500 MG PO TABS
500.0000 mg | ORAL_TABLET | Freq: Two times a day (BID) | ORAL | 0 refills | Status: AC
Start: 2022-07-06 — End: 2022-07-13

## 2022-07-06 MED ORDER — TRAMADOL HCL 50 MG PO TABS
50.0000 mg | ORAL_TABLET | Freq: Four times a day (QID) | ORAL | 0 refills | Status: AC | PRN
Start: 2022-07-06 — End: 2022-07-10

## 2022-07-06 MED ORDER — LIDOCAINE 5 % EX PTCH
1.0000 | MEDICATED_PATCH | CUTANEOUS | 0 refills | Status: DC
Start: 2022-07-06 — End: 2023-08-18

## 2022-07-06 NOTE — Telephone Encounter (Signed)
Patient calls nurse line regarding follow up from [facility] yesterday.   She was seen in the ED and received imaging. Per ED note, no acute findings on imaging.   Patient reports that she is continuing to have rib pain and "popping" sensation in chest.   At times she feels short of breath. Patient speaking in complete sentences during conversation. Scheduled patient for follow up visit this AM. ED precautions discussed in the meantime.   Veronda Prude, RN

## 2022-07-06 NOTE — Patient Instructions (Signed)
It was great to see you! Thank you for allowing me to participate in your care!   Our plans for today:  -I believe this is most likely a rib contusion -I am going to prescribe naproxen to take 500 mg twice a day with meals -I am prescribing a lidocaine patch to put right on top of this area every 24 hours -I am prescribing tramadol 50 mg every 6 hours as needed for severe pain that is breakthrough -Please follow-up in clinic in around a week to see if this is improving -Continue your incentive spirometry  Take care and seek immediate care sooner if you develop any concerns.  Levin Erp, MD
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
## 2022-07-10 ENCOUNTER — Other Ambulatory Visit: Payer: Self-pay

## 2022-07-10 ENCOUNTER — Emergency Department (HOSPITAL_BASED_OUTPATIENT_CLINIC_OR_DEPARTMENT_OTHER)
Admission: EM | Admit: 2022-07-10 | Discharge: 2022-07-10 | Disposition: A | Discharge status: Home / Self Care | Payer: No Typology Code available for payment source | Attending: Emergency Medicine | Admitting: Emergency Medicine

## 2022-07-10 ENCOUNTER — Encounter (HOSPITAL_BASED_OUTPATIENT_CLINIC_OR_DEPARTMENT_OTHER): Payer: Self-pay | Admitting: Emergency Medicine

## 2022-07-10 DIAGNOSIS — T8133XA Disruption of traumatic injury wound repair, initial encounter: Secondary | ICD-10-CM | POA: Insufficient documentation

## 2022-07-10 DIAGNOSIS — Y838 Other surgical procedures as the cause of abnormal reaction of the patient, or of later complication, without mention of misadventure at the time of the procedure: Secondary | ICD-10-CM | POA: Diagnosis not present

## 2022-07-10 DIAGNOSIS — L089 Local infection of the skin and subcutaneous tissue, unspecified: Secondary | ICD-10-CM

## 2022-07-10 MED ORDER — CLINDAMYCIN HCL 150 MG PO CAPS: 300.0000 mg | ORAL_CAPSULE | Freq: Four times a day (QID) | ORAL | 0 refills | Status: AC
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
## 2022-07-10 NOTE — ED Triage Notes (Signed)
Pt states her stitches to her left lower jaw/mouth is swollen and draining brown.

## 2022-07-10 NOTE — Discharge Instructions (Signed)
Perform salt water rinses 4 times a day, continue to monitor for worsening of infection, fever, chills.
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
## 2022-07-12 ENCOUNTER — Ambulatory Visit: Payer: Medicaid Other | Admitting: Family Medicine

## 2022-07-12 ENCOUNTER — Telehealth: Payer: Self-pay | Admitting: *Deleted

## 2022-07-12 ENCOUNTER — Telehealth: Payer: Self-pay

## 2022-07-12 ENCOUNTER — Other Ambulatory Visit (HOSPITAL_COMMUNITY): Payer: Self-pay

## 2022-07-12 NOTE — Telephone Encounter (Signed)
Transition Care Management Follow-up Telephone Call Date of discharge and from where: [facility] on 07/05/2022 How have you been since you were released from the hospital? 7days Any questions or concerns? No   Items Reviewed: Did the pt receive and understand the discharge instructions provided? Yes  Medications obtained and verified? Yes  Other? No  Any new allergies since your discharge? No  Dietary orders reviewed? Yes Do you have support at home? Yes     Follow up appointments reviewed:  PCP Hospital f/u appt confirmed? No  Scheduled to see Suppose to go this am to get the stitiches today and now a now has to see and Ent , and plastic Surgeon, waiting on a referral  Specialist Hospital f/u appt confirmed? No  Scheduled  Are transportation arrangements needed? No  If their condition worsens, is the pt aware to call PCP or go to the Emergency Dept.? Yes Was the patient provided with contact information for the PCP's office or ED? No Was to pt encouraged to call back with questions or concerns? Yes

## 2022-07-12 NOTE — Telephone Encounter (Signed)
A Prior Authorization was initiated for this patients LIDOCAINE PATCHES through [employer].   Confirmation # [id number]

## 2022-07-15 ENCOUNTER — Telehealth: Payer: Self-pay

## 2022-07-15 NOTE — Telephone Encounter (Signed)
Prior Auth for patients medication LIDOCAINE PATCHES denied by MEDICAID via CoverMyMeds.   Lidocaine 4% patches available OTC

## 2022-07-15 NOTE — Telephone Encounter (Signed)
Transition Care Management Follow-up Telephone Call Date of discharge and from where: 07/10/2022, [facility]. How have you been since you were released from the hospital? Patient stated that she is not feeling much better. Any questions or concerns? No  Items Reviewed: Did the pt receive and understand the discharge instructions provided? Yes  Medications obtained and verified? Yes  Other? No  Any new allergies since your discharge? No  Dietary orders reviewed? Yes Do you have support at home? Yes    Follow up appointments reviewed:  PCP Hospital f/u appt confirmed? Yes  Scheduled to see Fayette Pho MD on 07/12/2022 @ [facility]. Specialist Hospital f/u appt confirmed?  Patient plans to call [facility] regarding follow up of wound.  Scheduled to see  on  @ . Are transportation arrangements needed? No  If their condition worsens, is the pt aware to call PCP or go to the Emergency Dept.? Yes Was the patient provided with contact information for the PCP's office or ED? Yes Was to pt encouraged to call back with questions or concerns? Yes  Ela Moffat [facility] Population Health Community Resource Care Guide   ??[email or web address]  ?? [phone]   Website: [email or web address]  [email or web address]

## 2022-11-03 ENCOUNTER — Telehealth: Payer: Self-pay

## 2022-11-03 NOTE — Telephone Encounter (Signed)
Received VM from Ginny Forth, regarding patient's disability claim.   Returned call to [employer]. They are needing additional information for patient's claim.   Representative will fax required paperwork to our office.   FYI to PCP.   Veronda Prude, RN

## 2022-11-04 NOTE — Telephone Encounter (Signed)
reviewed

## 2022-11-09 NOTE — Telephone Encounter (Signed)
Patient returns call to nurse line regarding status of paperwork.   Dr. McDiarmid- has additional paperwork been completed or are we still waiting to receive this from [employer]?  Please let me know if I need to call them back.   Thanks.   Veronda Prude, RN

## 2022-11-10 NOTE — Telephone Encounter (Signed)
Paperwork completed and Fax'd to [employer] Copy scanned into Media of EMR

## 2023-01-26 DIAGNOSIS — N3946 Mixed incontinence: Secondary | ICD-10-CM | POA: Diagnosis not present

## 2023-01-26 DIAGNOSIS — R350 Frequency of micturition: Secondary | ICD-10-CM | POA: Diagnosis not present

## 2023-02-28 ENCOUNTER — Other Ambulatory Visit: Payer: Self-pay | Admitting: Urology

## 2023-02-28 ENCOUNTER — Encounter (HOSPITAL_BASED_OUTPATIENT_CLINIC_OR_DEPARTMENT_OTHER): Payer: Self-pay | Admitting: Emergency Medicine

## 2023-02-28 ENCOUNTER — Emergency Department (HOSPITAL_BASED_OUTPATIENT_CLINIC_OR_DEPARTMENT_OTHER)
Admission: EM | Admit: 2023-02-28 | Discharge: 2023-02-28 | Disposition: A | Discharge status: Home / Self Care | Payer: Medicaid Other | Attending: Emergency Medicine | Admitting: Emergency Medicine

## 2023-02-28 ENCOUNTER — Other Ambulatory Visit: Payer: Self-pay

## 2023-02-28 DIAGNOSIS — R519 Headache, unspecified: Secondary | ICD-10-CM | POA: Insufficient documentation

## 2023-02-28 DIAGNOSIS — J069 Acute upper respiratory infection, unspecified: Secondary | ICD-10-CM | POA: Diagnosis not present

## 2023-02-28 DIAGNOSIS — Z20822 Contact with and (suspected) exposure to covid-19: Secondary | ICD-10-CM | POA: Insufficient documentation

## 2023-02-28 DIAGNOSIS — J45909 Unspecified asthma, uncomplicated: Secondary | ICD-10-CM | POA: Diagnosis not present

## 2023-02-28 DIAGNOSIS — R059 Cough, unspecified: Secondary | ICD-10-CM | POA: Diagnosis present

## 2023-02-28 DIAGNOSIS — B9789 Other viral agents as the cause of diseases classified elsewhere: Secondary | ICD-10-CM | POA: Diagnosis not present

## 2023-02-28 LAB — RESP PANEL BY RT-PCR (RSV, FLU A&B, COVID)  RVPGX2
Influenza A by PCR: NEGATIVE
Influenza B by PCR: NEGATIVE
Resp Syncytial Virus by PCR: NEGATIVE
SARS Coronavirus 2 by RT PCR: NEGATIVE

## 2023-02-28 LAB — GROUP A STREP BY PCR: Group A Strep by PCR: NOT DETECTED

## 2023-02-28 MED ORDER — LIDOCAINE VISCOUS HCL 2 % MT SOLN: 15.0000 mL | OROMUCOSAL | 0 refills | Status: DC | PRN

## 2023-02-28 MED ORDER — LIDOCAINE VISCOUS HCL 2 % MT SOLN: 15.0000 mL | Freq: Four times a day (QID) | OROMUCOSAL | 0 refills | Status: DC | PRN

## 2023-02-28 NOTE — ED Provider Notes (Signed)
[place] EMERGENCY DEPARTMENT AT [facility] Provider Note   CSN: [id number] Arrival date & time: 02/28/23  0160     History  Chief Complaint  Patient presents with   Cough   Sore Throat   Shortness of Breath    [name] is a 42 y.o. female.   Cough Associated symptoms: shortness of breath   Sore Throat Associated symptoms include shortness of breath.  Shortness of Breath Associated symptoms: cough   Patient presents to the ER with productive cough, sore throat, achiness. history of GERD, intermittent asthma, atopic dermatitis, smoking tobacco use.  She states that this has been going on for last 24 hours.  Endorses sick contacts at work.  Also endorses headache, fatigue, fever.  Has not taken any Tylenol or ibuprofen.  Denies nausea, vomiting, diarrhea, abdominal pain, chest pain, shortness of breath.          Home Medications Prior to Admission medications   Medication Sig Start Date End Date Taking? Authorizing Provider  lidocaine (XYLOCAINE) 2 % solution Use as directed 15 mLs in the mouth or throat as needed for mouth pain. 02/28/23  Yes Lunette Stands, PA-C  albuterol (VENTOLIN HFA) 108 (90 Base) MCG/ACT inhaler Inhale 1-2 puffs into the lungs every 4 (four) hours as needed for wheezing or shortness of breath. 03/19/22   Domenick Gong, MD  amLODipine (NORVASC) 10 MG tablet TAKE 1 TABLET(10 MG) BY MOUTH AT BEDTIME 01/29/22   McDiarmid, Leighton Roach, MD  ARIPiprazole (ABILIFY) 5 MG tablet Take 5 mg by mouth every morning. 04/25/20   [provider]  benzonatate (TESSALON) 100 MG capsule Take 1 capsule (100 mg total) by mouth every 8 (eight) hours. 01/10/22   Mannie Stabile, PA-C  buPROPion (WELLBUTRIN XL) 150 MG 24 hr tablet Take 150 mg by mouth every morning. 04/15/21   [provider]  cyclobenzaprine (FLEXERIL) 5 MG tablet Take 1-2 tablets (5-10 mg total) by mouth 3 (three) times daily as needed for muscle spasms. 01/15/21    Cristie Hem, PA-C  gabapentin (NEURONTIN) 600 MG tablet TAKE 1 TABLET(600 MG) BY MOUTH THREE TIMES DAILY 10/09/21   Kirsteins, Victorino Sparrow, MD  ipratropium (ATROVENT) 0.06 % nasal spray Place 2 sprays into both nostrils 4 (four) times daily. 03/19/22   Domenick Gong, MD  ketorolac (TORADOL) 15 MG/ML injection Pt may have 15mg  toradol once a week PRN per Dr. Perley Jain 02/20/21   McDiarmid, Leighton Roach, MD  lidocaine (LIDODERM) 5 % Place 1 patch onto the skin daily. Remove & Discard patch within 12 hours or as directed by MD 07/06/22   Levin Erp, MD  methocarbamol (ROBAXIN) 500 MG tablet Take 1 tablet (500 mg total) by mouth 2 (two) times daily. 07/05/22   Blue, Soijett A, PA-C  mirabegron ER (MYRBETRIQ) 50 MG TB24 tablet Take 1 tablet (50 mg total) by mouth daily. 03/13/21   McDiarmid, Leighton Roach, MD  Multiple Vitamins-Minerals (MULTIVITAMIN) tablet Take 1 tablet by mouth daily as needed. 06/27/18   [provider]  naproxen (NAPROSYN) 500 MG tablet Take 1 tablet (500 mg total) by mouth 2 (two) times daily with a meal. 01/15/21   Cristie Hem, PA-C  omeprazole (PRILOSEC) 20 MG capsule TAKE 1 CAPSULE(20 MG) BY MOUTH DAILY 11/07/21   McDiarmid, Leighton Roach, MD  oseltamivir (TAMIFLU) 75 MG capsule Take 1 capsule (75 mg total) by mouth 2 (two) times daily. X 5 days 03/19/22   Domenick Gong, MD  Oxcarbazepine (TRILEPTAL) 300  MG tablet Take 300 mg by mouth 2 (two) times daily. 02/23/21   [provider]  oxybutynin (DITROPAN XL) 10 MG 24 hr tablet Take 1 tablet (10 mg total) by mouth at bedtime. 05/05/21   McDiarmid, Leighton Roach, MD  promethazine-dextromethorphan (PROMETHAZINE-DM) 6.25-15 MG/5ML syrup Take 5 mLs by mouth 4 (four) times daily as needed for cough. 03/19/22   Domenick Gong, MD  propranolol (INDERAL) 10 MG tablet SMARTSIG:0.5-1 Tablet(s) By Mouth 1-3 Times Daily PRN 02/23/21   [provider]  Spacer/Aero-Holding Chambers (AEROCHAMBER MV) inhaler Use as instructed 03/19/22    Domenick Gong, MD  VRAYLAR 1.5 MG capsule Take 1.5 mg by mouth daily. 02/20/21   [provider]  VYVANSE 70 MG capsule Take 70 mg by mouth every morning. 02/23/21   [provider]  zolpidem (AMBIEN) 10 MG tablet  03/11/21   [provider]      Allergies    Azelastine and Azelastine hcl    Review of Systems   Review of Systems  Respiratory:  Positive for cough and shortness of breath.   All other systems reviewed and are negative.   Physical Exam Updated Vital Signs BP (!) 139/98   Pulse 72   Temp 98.3 F (36.8 C)   Resp 16   Ht 5\' 6"  (1.676 m)   Wt 70.3 kg   SpO2 97%   BMI 25.02 kg/m  Physical Exam Vitals and nursing note reviewed.  Constitutional:      General: She is not in acute distress.    Appearance: Normal appearance. She is well-developed.  HENT:     Head: Normocephalic and atraumatic.     Right Ear: No drainage or swelling. Tympanic membrane is not erythematous.     Left Ear: No drainage or swelling. Tympanic membrane is not erythematous.     Nose: Congestion present.     Mouth/Throat:     Mouth: Mucous membranes are moist. No oral lesions.     Pharynx: Oropharynx is clear. No pharyngeal swelling, oropharyngeal exudate, posterior oropharyngeal erythema or uvula swelling.     Comments: Drainage noted in oropharynx Eyes:     Extraocular Movements: Extraocular movements intact.     Conjunctiva/sclera: Conjunctivae normal.  Cardiovascular:     Rate and Rhythm: Normal rate and regular rhythm.     Pulses: Normal pulses.     Heart sounds: Normal heart sounds. No murmur heard.    No friction rub. No gallop.  Pulmonary:     Effort: Pulmonary effort is normal. No respiratory distress.     Breath sounds: Normal breath sounds. No stridor. No wheezing, rhonchi or rales.  Chest:     Chest wall: No tenderness.  Abdominal:     General: Abdomen is flat.     Palpations: Abdomen is soft.     Tenderness: There is no abdominal tenderness.   Musculoskeletal:        General: No swelling.  Skin:    General: Skin is warm and dry.  Neurological:     General: No focal deficit present.     Mental Status: She is alert. Mental status is at baseline.  Psychiatric:        Mood and Affect: Mood normal.   .uri  ED Results / Procedures / Treatments   Labs (all labs ordered are listed, but only abnormal results are displayed) Labs Reviewed  GROUP A STREP BY PCR  RESP PANEL BY RT-PCR (RSV, FLU A&B, COVID)  [id number]    EKG  None  Radiology No results found.  Procedures Procedures    Medications Ordered in ED Medications - No data to display  ED Course/ Medical Decision Making/ A&P                                 Medical Decision Making  This patient is a 42 year old female who presents to the ED for concern of sore throat, rhinorrhea, cough.   Differential diagnoses prior to evaluation: The emergent differential diagnosis includes, but is not limited to, URI, pneumonia, AOM, asthma, bronchitis. This is not an exhaustive differential.   Past Medical History / Co-morbidities / Social History: Current smoker, intermittent asthma   Lab Tests/Imaging studies: I personally interpreted labs and the pertinent results include:   Respiratory panel negative Strep test negative      Medications: I ordered medication including viscous lidocaine for home for sore throat.  I have reviewed the patients home medicines and have made adjustments as needed.    ED Course:  42 year old female presents to the ED with cough, congestion, sore throat, subjective fevers.  This is most likely due to URI due to patient encountering viral illnesses at work and only having symptoms last 24 hours.  Respiratory panel was negative, strep test negative.  Physical exam showed congestion with nasal drainage in  the oropharynx.  No wheezes, rales, rhonchi heard.  Does not appear to be exacerbating her asthma or bronchitis.  Vitals stable with no  tachycardia or tachypnea.  O2 saturation 97% on room air.  Recommend patient stop smoking and treat symptoms at home with supportive treatment.  Also prescribed viscous lidocaine for her at home to help with sore throat.  Patient appears safe be discharged.  Strict return to ER precautions were provided.  Patient agreed with plan and expressed understanding.   Disposition: After consideration of the diagnostic results and the patients response to treatment, I feel that patient benefit from discharge, rest, treatment noted as above.   emergency department workup does not suggest an emergent condition requiring admission or immediate intervention beyond what has been performed at this time. The plan is: Supportive care, rest, hydration, return to ER for new or worsening symptoms. The patient is safe for discharge and has been instructed to return immediately for worsening symptoms, change in symptoms or any other concerns.   Final Clinical Impression(s) / ED Diagnoses Final diagnoses:  Viral upper respiratory tract infection    Rx / DC Orders ED Discharge Orders          Ordered    lidocaine (XYLOCAINE) 2 % solution  As needed        02/28/23 0720              Lunette Stands, PA-C 02/28/23 2440    Derwood Kaplan, MD 02/28/23 [phone]

## 2023-02-28 NOTE — Discharge Instructions (Addendum)
You were seen today for what appears to be a viral upper respiratory infection.  This can be due to many different types of viruses.  Do not return to work until fever free for 24 hours.  You can take Tylenol and ibuprofen for pain control and to help relieve achiness.  You can alternate these medications every 2-4 hours.   Take Tylenol (acetominophen)  650mg  every 4-6 hours, as needed for pain or fever. Do not take more than 4,000 mg in a 24-hour period. As this may cause liver damage. While this is rare, if you begin to develop yellowing of the skin or eyes, stop taking and return to ER immediately.  Take Ibuprofen 400mg  every 4-6 hours for pain or fever, not exceeding 3,200 mg per day as more than 3,200mg  can cause Stomach irritation, dizziness, kidney issues with long-term use.  I have also prescribed viscous lidocaine.  You can drink this which will help with your sore throat.  The most helpful thing to do is to rest and stay hydrated.  It was a pleasure seeing you in the ER.

## 2023-02-28 NOTE — ED Triage Notes (Addendum)
Pt ambualtes into the ED today for cough, hot and cold flashes, chills, SOB, sore throat, and generalized body aches x 1 week. Pt is afebrile on arrival to ED, SPO2 100% on RA.

## 2023-02-28 NOTE — ED Notes (Signed)
Pt given discharge instructions and reviewed prescriptions. Opportunities given for questions. Pt verbalizes understanding. Madi Bonfiglio R, RN 

## 2023-03-23 ENCOUNTER — Encounter (HOSPITAL_BASED_OUTPATIENT_CLINIC_OR_DEPARTMENT_OTHER): Payer: Self-pay | Admitting: Urology

## 2023-03-23 NOTE — Progress Notes (Signed)
 Spoke w/ via phone for pre-op interview--- [name] needs dos---- ISTAT, EKG and UPT per anesthesia        [name] results------ COVID test -----patient states asymptomatic no test needed Arrive at -------0700 NPO after MN NO Solid Food.   Med rec completed Medications to take morning of surgery -----Norvasc , Abilify, Wellbutrin, Gabapentin , Prilosec, Myrbetriq , Ditropan , Propranolol-PRN  Diabetic medication ----- Patient instructed no nail polish to be worn day of surgery Patient instructed to bring photo id and insurance card day of surgery Patient aware to have Driver (ride ) / caregiver    for 24 hours after surgery - Mother [name] Patient Special Instructions ----- Pre-Op special Instructions ----- Patient verbalized understanding of instructions that were given at this phone interview. Patient denies chest pain, sob, fever, cough at the interview.

## 2023-03-29 ENCOUNTER — Encounter (HOSPITAL_BASED_OUTPATIENT_CLINIC_OR_DEPARTMENT_OTHER): Payer: Self-pay | Admitting: Urology

## 2023-03-29 ENCOUNTER — Encounter (HOSPITAL_BASED_OUTPATIENT_CLINIC_OR_DEPARTMENT_OTHER)
Admission: RE | Disposition: A | Discharge status: Home / Self Care | Payer: Self-pay | Source: Home / Self Care | Attending: Urology

## 2023-03-29 ENCOUNTER — Other Ambulatory Visit: Payer: Self-pay

## 2023-03-29 ENCOUNTER — Ambulatory Visit (HOSPITAL_BASED_OUTPATIENT_CLINIC_OR_DEPARTMENT_OTHER): Payer: Medicaid Other | Admitting: Anesthesiology

## 2023-03-29 ENCOUNTER — Ambulatory Visit (HOSPITAL_BASED_OUTPATIENT_CLINIC_OR_DEPARTMENT_OTHER)
Admission: RE | Admit: 2023-03-29 | Discharge: 2023-03-29 | Disposition: A | Discharge status: Home / Self Care | Payer: Medicaid Other | Attending: Urology | Admitting: Urology

## 2023-03-29 DIAGNOSIS — K279 Peptic ulcer, site unspecified, unspecified as acute or chronic, without hemorrhage or perforation: Secondary | ICD-10-CM | POA: Insufficient documentation

## 2023-03-29 DIAGNOSIS — K219 Gastro-esophageal reflux disease without esophagitis: Secondary | ICD-10-CM | POA: Diagnosis not present

## 2023-03-29 DIAGNOSIS — N3592 Unspecified urethral stricture, female: Secondary | ICD-10-CM | POA: Diagnosis not present

## 2023-03-29 DIAGNOSIS — R350 Frequency of micturition: Secondary | ICD-10-CM | POA: Diagnosis not present

## 2023-03-29 DIAGNOSIS — M1990 Unspecified osteoarthritis, unspecified site: Secondary | ICD-10-CM | POA: Diagnosis not present

## 2023-03-29 DIAGNOSIS — G894 Chronic pain syndrome: Secondary | ICD-10-CM | POA: Diagnosis not present

## 2023-03-29 DIAGNOSIS — N3941 Urge incontinence: Secondary | ICD-10-CM

## 2023-03-29 DIAGNOSIS — F419 Anxiety disorder, unspecified: Secondary | ICD-10-CM | POA: Insufficient documentation

## 2023-03-29 DIAGNOSIS — I10 Essential (primary) hypertension: Secondary | ICD-10-CM | POA: Diagnosis not present

## 2023-03-29 DIAGNOSIS — J45909 Unspecified asthma, uncomplicated: Secondary | ICD-10-CM | POA: Insufficient documentation

## 2023-03-29 DIAGNOSIS — F17210 Nicotine dependence, cigarettes, uncomplicated: Secondary | ICD-10-CM | POA: Diagnosis not present

## 2023-03-29 DIAGNOSIS — F319 Bipolar disorder, unspecified: Secondary | ICD-10-CM | POA: Insufficient documentation

## 2023-03-29 HISTORY — PX: CYSTOSCOPY: SHX5120

## 2023-03-29 HISTORY — PX: BOTOX INJECTION: SHX5754

## 2023-03-29 LAB — POCT I-STAT, CHEM 8
BUN: 7 mg/dL (ref 6–20)
Calcium, Ion: 1.23 mmol/L (ref 1.15–1.40)
Chloride: 103 mmol/L (ref 98–111)
Creatinine, Ser: 0.9 mg/dL (ref 0.44–1.00)
Glucose, Bld: 88 mg/dL (ref 70–99)
HCT: 44 % (ref 36.0–46.0)
Hemoglobin: 15 g/dL (ref 12.0–15.0)
Potassium: 3.9 mmol/L (ref 3.5–5.1)
Sodium: 141 mmol/L (ref 135–145)
TCO2: 25 mmol/L (ref 22–32)

## 2023-03-29 LAB — POCT PREGNANCY, URINE: Preg Test, Ur: NEGATIVE

## 2023-03-29 SURGERY — BOTOX INJECTION
Anesthesia: General | Site: Bladder

## 2023-03-29 MED ORDER — KETOROLAC TROMETHAMINE 30 MG/ML IJ SOLN
INTRAMUSCULAR | Status: AC
  Filled 2023-03-29: qty 1

## 2023-03-29 MED ORDER — DROPERIDOL 2.5 MG/ML IJ SOLN: 0.6250 mg | Freq: Once | INTRAMUSCULAR | Status: DC | PRN

## 2023-03-29 MED ORDER — DEXAMETHASONE SODIUM PHOSPHATE 10 MG/ML IJ SOLN
INTRAMUSCULAR | Status: AC
  Filled 2023-03-29: qty 1

## 2023-03-29 MED ORDER — CIPROFLOXACIN HCL 250 MG PO TABS: 250.0000 mg | ORAL_TABLET | Freq: Two times a day (BID) | ORAL | 0 refills | Status: DC

## 2023-03-29 MED ORDER — MIDAZOLAM HCL 5 MG/5ML IJ SOLN
INTRAMUSCULAR | Status: DC | PRN
  Administered 2023-03-29: 2 mg via INTRAVENOUS

## 2023-03-29 MED ORDER — LIDOCAINE HCL (PF) 2 % IJ SOLN
INTRAMUSCULAR | Status: AC
  Filled 2023-03-29: qty 5

## 2023-03-29 MED ORDER — ONDANSETRON HCL 4 MG/2ML IJ SOLN
INTRAMUSCULAR | Status: AC
  Filled 2023-03-29: qty 2

## 2023-03-29 MED ORDER — CIPROFLOXACIN IN D5W 400 MG/200ML IV SOLN
INTRAVENOUS | Status: AC
  Filled 2023-03-29: qty 200

## 2023-03-29 MED ORDER — LIDOCAINE 2% (20 MG/ML) 5 ML SYRINGE
INTRAMUSCULAR | Status: DC | PRN
  Administered 2023-03-29: 60 mg via INTRAVENOUS

## 2023-03-29 MED ORDER — ONDANSETRON HCL 4 MG/2ML IJ SOLN
INTRAMUSCULAR | Status: DC | PRN
  Administered 2023-03-29: 4 mg via INTRAVENOUS

## 2023-03-29 MED ORDER — KETOROLAC TROMETHAMINE 30 MG/ML IJ SOLN
INTRAMUSCULAR | Status: DC | PRN
  Administered 2023-03-29: 30 mg via INTRAVENOUS

## 2023-03-29 MED ORDER — ACETAMINOPHEN 500 MG PO TABS
1000.0000 mg | ORAL_TABLET | Freq: Once | ORAL | Status: AC
  Administered 2023-03-29: 1000 mg via ORAL

## 2023-03-29 MED ORDER — FENTANYL CITRATE (PF) 100 MCG/2ML IJ SOLN
INTRAMUSCULAR | Status: DC | PRN
  Administered 2023-03-29 (×2): 50 ug via INTRAVENOUS

## 2023-03-29 MED ORDER — ACETAMINOPHEN 500 MG PO TABS
ORAL_TABLET | ORAL | Status: AC
  Filled 2023-03-29: qty 2

## 2023-03-29 MED ORDER — SODIUM CHLORIDE (PF) 0.9 % IJ SOLN
INTRAMUSCULAR | Status: DC | PRN
  Administered 2023-03-29: 10 mL

## 2023-03-29 MED ORDER — FENTANYL CITRATE (PF) 100 MCG/2ML IJ SOLN
INTRAMUSCULAR | Status: AC
  Filled 2023-03-29: qty 2

## 2023-03-29 MED ORDER — STERILE WATER FOR IRRIGATION IR SOLN
Status: DC | PRN
  Administered 2023-03-29: 500 mL
  Administered 2023-03-29: 3000 mL

## 2023-03-29 MED ORDER — PROPOFOL 10 MG/ML IV BOLUS
INTRAVENOUS | Status: AC
  Filled 2023-03-29: qty 20

## 2023-03-29 MED ORDER — FENTANYL CITRATE (PF) 100 MCG/2ML IJ SOLN
25.0000 ug | INTRAMUSCULAR | Status: DC | PRN
Start: 2023-03-29 — End: 2023-03-29

## 2023-03-29 MED ORDER — DEXAMETHASONE SODIUM PHOSPHATE 4 MG/ML IJ SOLN
INTRAMUSCULAR | Status: DC | PRN
  Administered 2023-03-29: 5 mg via INTRAVENOUS

## 2023-03-29 MED ORDER — LACTATED RINGERS IV SOLN: INTRAVENOUS | Status: DC

## 2023-03-29 MED ORDER — LIDOCAINE HCL (CARDIAC) PF 100 MG/5ML IV SOSY
PREFILLED_SYRINGE | INTRAVENOUS | Status: DC | PRN
  Administered 2023-03-29: 70 mg via INTRAVENOUS

## 2023-03-29 MED ORDER — CIPROFLOXACIN IN D5W 400 MG/200ML IV SOLN
400.0000 mg | INTRAVENOUS | Status: AC
  Administered 2023-03-29: 400 mg via INTRAVENOUS

## 2023-03-29 MED ORDER — ONABOTULINUMTOXINA 100 UNITS IJ SOLR
INTRAMUSCULAR | Status: DC | PRN
  Administered 2023-03-29: 100 [IU] via INTRAMUSCULAR

## 2023-03-29 MED ORDER — SODIUM CHLORIDE 0.9 % IV SOLN
INTRAVENOUS | Status: DC
Start: 2023-03-29 — End: 2023-03-29

## 2023-03-29 MED ORDER — MIDAZOLAM HCL 2 MG/2ML IJ SOLN
INTRAMUSCULAR | Status: AC
  Filled 2023-03-29: qty 2

## 2023-03-29 MED ORDER — PROPOFOL 10 MG/ML IV BOLUS
INTRAVENOUS | Status: DC | PRN
  Administered 2023-03-29: 40 mg via INTRAVENOUS
  Administered 2023-03-29: 100 mg via INTRAVENOUS
  Administered 2023-03-29: 160 mg via INTRAVENOUS
  Administered 2023-03-29: 100 mg via INTRAVENOUS

## 2023-03-29 SURGICAL SUPPLY — 22 items
BAG DRAIN URO-CYSTO SKYTR STRL (DRAIN) ×1 IMPLANT
BALLN NEPHROSTOMY (BALLOONS)
BALLOON NEPHROSTOMY (BALLOONS) IMPLANT
CATH ROBINSON RED A/P 14FR (CATHETERS) ×1 IMPLANT
CLOTH BEACON ORANGE TIMEOUT ST (SAFETY) ×1 IMPLANT
GLOVE BIO SURGEON STRL SZ7.5 (GLOVE) ×1 IMPLANT
GOWN STRL REUS W/TWL XL LVL3 (GOWN DISPOSABLE) ×1 IMPLANT
GUIDEWIRE STR DUAL SENSOR (WIRE) ×1 IMPLANT
KIT TURNOVER CYSTO (KITS) ×1 IMPLANT
MANIFOLD NEPTUNE II (INSTRUMENTS) ×1 IMPLANT
NDL ASPIRATION 22 (NEEDLE) IMPLANT
NDL SAFETY ECLIPSE 18X1.5 (NEEDLE) IMPLANT
NEEDLE ASPIRATION 22 (NEEDLE) ×1
PACK CYSTO (CUSTOM PROCEDURE TRAY) ×1 IMPLANT
SLEEVE SCD COMPRESS KNEE MED (STOCKING) ×1 IMPLANT
SYR 10ML LL (SYRINGE) IMPLANT
SYR 20ML LL LF (SYRINGE) IMPLANT
SYR TOOMEY IRRIG 70ML (MISCELLANEOUS)
SYRINGE TOOMEY IRRIG 70ML (MISCELLANEOUS) IMPLANT
TUBE CONNECTING 12X1/4 (SUCTIONS) IMPLANT
WATER STERILE IRR 3000ML UROMA (IV SOLUTION) ×1 IMPLANT
WATER STERILE IRR 500ML POUR (IV SOLUTION) IMPLANT

## 2023-03-29 NOTE — Anesthesia Preprocedure Evaluation (Addendum)
 Anesthesia Evaluation  Patient identified by MRN, date of birth, ID band Patient awake    Reviewed: Allergy & Precautions, NPO status , Patient's Chart, lab work & pertinent test results  Airway Mallampati: II  TM Distance: >3 FB Neck ROM: Full    Dental  (+) Dental Advisory Given, Teeth Intact   Pulmonary asthma , Current Smoker and Patient abstained from smoking.   Pulmonary exam normal breath sounds clear to auscultation       Cardiovascular hypertension, Pt. on medications and Pt. on home beta blockers Normal cardiovascular exam Rhythm:Regular Rate:Normal     Neuro/Psych  PSYCHIATRIC DISORDERS Anxiety Depression Bipolar Disorder Schizophrenia  negative neurological ROS     GI/Hepatic Neg liver ROS, PUD,GERD  Medicated,,  Endo/Other  negative endocrine ROS    Renal/GU negative Renal ROS     Musculoskeletal  (+) Arthritis ,    Abdominal   Peds  Hematology negative hematology ROS (+)   Anesthesia Other Findings   Reproductive/Obstetrics negative OB ROS                             Anesthesia Physical Anesthesia Plan  ASA: 2  Anesthesia Plan: General   Post-op Pain Management: Tylenol  PO (pre-op)*   Induction: Intravenous  PONV Risk Score and Plan: 4 or greater and Ondansetron , Dexamethasone , Treatment may vary due to age or medical condition and Midazolam   Airway Management Planned: LMA  Additional Equipment:   Intra-op Plan:   Post-operative Plan: Extubation in OR  Informed Consent: I have reviewed the patients History and Physical, chart, labs and discussed the procedure including the risks, benefits and alternatives for the proposed anesthesia with the patient or authorized representative who has indicated his/her understanding and acceptance.     Dental advisory given  Plan Discussed with: CRNA  Anesthesia Plan Comments:         Anesthesia Quick  Evaluation

## 2023-03-29 NOTE — Interval H&P Note (Signed)
 History and Physical Interval Note:  03/29/2023 8:50 AM  [name]  has presented today for surgery, with the diagnosis of Refractory Urgency Incontinence.  The various methods of treatment have been discussed with the patient and family. After consideration of risks, benefits and other options for treatment, the patient has consented to  Procedure(s) with comments: BOTOX  INJECTION (N/A) - 30 MINUTE CASE CYSTOSCOPY (N/A) as a surgical intervention.  The patient's history has been reviewed, patient examined, no change in status, stable for surgery.  I have reviewed the patient's chart and labs.  Questions were answered to the patient's satisfaction.     Remona Boom A Sidney Silberman

## 2023-03-29 NOTE — Transfer of Care (Signed)
 Immediate Anesthesia Transfer of Care Note  Patient: [name]  Procedure(s) Performed: Procedure(s) (LRB): BOTOX  INJECTION (N/A) CYSTOSCOPY (N/A)  Patient Location: PACU  Anesthesia Type: GA  Level of Consciousness: awake, sedated, patient cooperative and responds to stimulation, c/o pain in back - comfort measures given w/ medication   Airway & Oxygen  Therapy: Patient Spontanous Breathing and Patient connected to [place] oxygen   Post-op Assessment: Report given to PACU RN, Post -op Vital signs reviewed and stable and Patient moving all extremities  Post vital signs: Reviewed and stable  Complications: No apparent anesthesia complications

## 2023-03-29 NOTE — Discharge Instructions (Addendum)
 I have reviewed discharge instructions in detail with the patient. They will follow-up with me or their physician as scheduled. My nurse will also be calling the patients as per protocol.        Post Anesthesia Home Care Instructions  Activity: Get plenty of rest for the remainder of the day. A responsible individual must stay with you for 24 hours following the procedure.  For the next 24 hours, DO NOT: -Drive a car -Advertising copywriter -Drink alcoholic beverages -Take any medication unless instructed by your physician -Make any legal decisions or sign important papers.  Meals: Start with liquid foods such as gelatin or soup. Progress to regular foods as tolerated. Avoid greasy, spicy, heavy foods. If nausea and/or vomiting occur, drink only clear liquids until the nausea and/or vomiting subsides. Call your physician if vomiting continues.  Special Instructions/Symptoms: Your throat may feel dry or sore from the anesthesia or the breathing tube placed in your throat during surgery. If this causes discomfort, gargle with warm salt water. The discomfort should disappear within 24 hours.

## 2023-03-29 NOTE — Op Note (Signed)
 Preoperative diagnosis: Refractory urgency incontinence Postoperative diagnosis: Refractory urgency incontinence and meatal stenosis and possible cystitis Surgery: Cystoscopy injection of botulinum toxin  Patient was consented with the above diagnosis for the above procedure.  Preoperative antibiotics were given.  The patient had moderate meatal stenosis.  I gently dilated with a well-lubricated sound from 16 French to 24 French.  The injection scope was inserted with obturator  Patient underwent cystoscopy.  She had impressive vascularity of her bladder that was very diffuse and symmetric.  It almost looks like a carpet of submucosal blood vessels.  The urothelium itself was completely normal.  She had very mild trabeculation.  I examined her bladder very carefully.  Because of the vascularity I was a bit concerned that she may have some bleeding with the injections  I injected 100 units of Botox  and 10 cc of normal saline in the lower third of the bladder sparing the trigone.  She did not have any significant bleeding.  I was not surprised that she did have some oozing from the bladder walls laterally even though her bladder was not over distended.  It was a typical oozing that you get with interstitial cystitis but likely it is a false positive from the vascularity versus interstitial cystitis  I emptied the majority of her bladder after inspecting it for any significant bleeding.  She will be followed as per protocol.

## 2023-03-29 NOTE — Anesthesia Procedure Notes (Signed)
 Procedure Name: LMA Insertion Date/Time: 03/29/2023 9:13 AM  Performed by: Pam Macario BROCKS, CRNAPre-anesthesia Checklist: Patient identified, Emergency Drugs available, Suction available, Patient being monitored and Timeout performed Patient Re-evaluated:Patient Re-evaluated prior to induction Oxygen  Delivery Method: Circle system utilized Preoxygenation: Pre-oxygenation with 100% oxygen  Induction Type: IV induction Ventilation: Mask ventilation without difficulty LMA: LMA inserted LMA Size: 4.0 Number of attempts: 2 Airway Equipment and Method: Bite block Placement Confirmation: positive ETCO2, breath sounds checked- equal and bilateral and CO2 detector Tube secured with: Tape Dental Injury: Teeth and Oropharynx as per pre-operative assessment
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
## 2023-03-29 NOTE — Anesthesia Postprocedure Evaluation (Signed)
 Anesthesia Post Note  Patient: [name]  Procedure(s) Performed: BOTOX  INJECTION (Bladder) CYSTOSCOPY (Bladder)     Patient location during evaluation: PACU Anesthesia Type: General Level of consciousness: sedated and patient cooperative Pain management: pain level controlled Vital Signs Assessment: post-procedure vital signs reviewed and stable Respiratory status: spontaneous breathing Cardiovascular status: stable Anesthetic complications: no   No notable events documented.  Last Vitals:  Vitals:   03/29/23 1015 03/29/23 1034  BP: (!) 164/99 (!) 178/111  Pulse: (!) 53 (!) 55  Resp: 12 12  Temp: 36.7 C   SpO2: 100% 100%    Last Pain:  Vitals:   03/29/23 1034  TempSrc:   PainSc: 0-No pain                 Norleen Pope

## 2023-03-30 ENCOUNTER — Encounter (HOSPITAL_BASED_OUTPATIENT_CLINIC_OR_DEPARTMENT_OTHER): Payer: Self-pay | Admitting: Urology

## 2023-03-31 ENCOUNTER — Other Ambulatory Visit: Payer: Self-pay | Admitting: Family Medicine

## 2023-03-31 DIAGNOSIS — I10 Essential (primary) hypertension: Secondary | ICD-10-CM

## 2023-04-04 NOTE — Telephone Encounter (Signed)
[facility] before further refills  

## 2023-04-14 ENCOUNTER — Other Ambulatory Visit: Payer: Self-pay

## 2023-04-14 ENCOUNTER — Encounter (HOSPITAL_BASED_OUTPATIENT_CLINIC_OR_DEPARTMENT_OTHER): Payer: Self-pay | Admitting: Emergency Medicine

## 2023-04-14 ENCOUNTER — Emergency Department (HOSPITAL_BASED_OUTPATIENT_CLINIC_OR_DEPARTMENT_OTHER)
Admission: EM | Admit: 2023-04-14 | Discharge: 2023-04-14 | Disposition: A | Discharge status: Home / Self Care | Payer: Medicaid Other | Attending: Emergency Medicine | Admitting: Emergency Medicine

## 2023-04-14 DIAGNOSIS — Z20822 Contact with and (suspected) exposure to covid-19: Secondary | ICD-10-CM | POA: Insufficient documentation

## 2023-04-14 DIAGNOSIS — J101 Influenza due to other identified influenza virus with other respiratory manifestations: Secondary | ICD-10-CM | POA: Diagnosis not present

## 2023-04-14 DIAGNOSIS — R059 Cough, unspecified: Secondary | ICD-10-CM | POA: Diagnosis present

## 2023-04-14 LAB — RESP PANEL BY RT-PCR (RSV, FLU A&B, COVID)  RVPGX2
Influenza A by PCR: POSITIVE — AB
Influenza B by PCR: NEGATIVE
Resp Syncytial Virus by PCR: NEGATIVE
SARS Coronavirus 2 by RT PCR: NEGATIVE

## 2023-04-14 NOTE — ED Notes (Signed)

## 2023-04-14 NOTE — ED Provider Notes (Signed)
[place] EMERGENCY DEPARTMENT AT [facility] Provider Note   CSN: [id number] Arrival date & time: 04/14/23  4098     History  Chief Complaint  Patient presents with   Cough    [name] is a 43 y.o. female.  Patient presents to the emergency department today for evaluation of flulike symptoms.  Today is day 3 of illness.  At the onset patient had some nausea and diarrhea.  Over the past 2 days she has had chest tightness with cough with associated sore throat, headache, congestion and runny nose.  She reports Tmax 101 F yesterday.  She has a history of asthma but states that her breathing symptoms are okay.  She been taking Mucinex at home.  No known definitive sick contacts.        Home Medications Prior to Admission medications   Medication Sig Start Date End Date Taking? Authorizing Provider  albuterol (VENTOLIN HFA) 108 (90 Base) MCG/ACT inhaler Inhale 1-2 puffs into the lungs every 4 (four) hours as needed for wheezing or shortness of breath. 03/19/22   Domenick Gong, MD  amLODipine (NORVASC) 10 MG tablet TAKE 1 TABLET(10 MG) BY MOUTH AT BEDTIME 04/04/23   McDiarmid, Leighton Roach, MD  ARIPiprazole (ABILIFY) 5 MG tablet Take 5 mg by mouth every morning. 04/25/20   [provider]  benzonatate (TESSALON) 100 MG capsule Take 1 capsule (100 mg total) by mouth every 8 (eight) hours. 01/10/22   Mannie Stabile, PA-C  buPROPion (WELLBUTRIN XL) 150 MG 24 hr tablet Take 150 mg by mouth every morning. 04/15/21   [provider]  ciprofloxacin (CIPRO) 250 MG tablet Take 1 tablet (250 mg total) by mouth 2 (two) times daily. 03/29/23   Alfredo Martinez, MD  cyclobenzaprine (FLEXERIL) 5 MG tablet Take 1-2 tablets (5-10 mg total) by mouth 3 (three) times daily as needed for muscle spasms. 01/15/21   Cristie Hem, PA-C  gabapentin (NEURONTIN) 600 MG tablet TAKE 1 TABLET(600 MG) BY MOUTH THREE TIMES DAILY 10/09/21   Kirsteins, Victorino Sparrow, MD  ipratropium  (ATROVENT) 0.06 % nasal spray Place 2 sprays into both nostrils 4 (four) times daily. 03/19/22   Domenick Gong, MD  ketorolac (TORADOL) 15 MG/ML injection Pt may have 15mg  toradol once a week PRN per Dr. Perley Jain 02/20/21   McDiarmid, Leighton Roach, MD  lidocaine (LIDODERM) 5 % Place 1 patch onto the skin daily. Remove & Discard patch within 12 hours or as directed by MD 07/06/22   Levin Erp, MD  lidocaine (XYLOCAINE) 2 % solution Use as directed 15 mLs in the mouth or throat every 6 (six) hours as needed for mouth pain. 02/28/23   Derwood Kaplan, MD  methocarbamol (ROBAXIN) 500 MG tablet Take 1 tablet (500 mg total) by mouth 2 (two) times daily. 07/05/22   Blue, Soijett A, PA-C  mirabegron ER (MYRBETRIQ) 50 MG TB24 tablet Take 1 tablet (50 mg total) by mouth daily. 03/13/21   McDiarmid, Leighton Roach, MD  Multiple Vitamins-Minerals (MULTIVITAMIN) tablet Take 1 tablet by mouth daily as needed. 06/27/18   [provider]  naproxen (NAPROSYN) 500 MG tablet Take 1 tablet (500 mg total) by mouth 2 (two) times daily with a meal. 01/15/21   Cristie Hem, PA-C  omeprazole (PRILOSEC) 20 MG capsule TAKE 1 CAPSULE(20 MG) BY MOUTH DAILY 11/07/21   McDiarmid, Leighton Roach, MD  oseltamivir (TAMIFLU) 75 MG capsule Take 1 capsule (75 mg total) by mouth 2 (two) times daily. X 5 days  03/19/22   Domenick Gong, MD  Oxcarbazepine (TRILEPTAL) 300 MG tablet Take 300 mg by mouth 2 (two) times daily. 02/23/21   [provider]  oxybutynin (DITROPAN XL) 10 MG 24 hr tablet Take 1 tablet (10 mg total) by mouth at bedtime. 05/05/21   McDiarmid, Leighton Roach, MD  promethazine-dextromethorphan (PROMETHAZINE-DM) 6.25-15 MG/5ML syrup Take 5 mLs by mouth 4 (four) times daily as needed for cough. 03/19/22   Domenick Gong, MD  propranolol (INDERAL) 10 MG tablet SMARTSIG:0.5-1 Tablet(s) By Mouth 1-3 Times Daily PRN 02/23/21   [provider]  Spacer/Aero-Holding Chambers (AEROCHAMBER MV) inhaler Use as instructed 03/19/22    Domenick Gong, MD  VRAYLAR 1.5 MG capsule Take 1.5 mg by mouth daily. 02/20/21   [provider]  VYVANSE 70 MG capsule Take 70 mg by mouth every morning. 02/23/21   [provider]  zolpidem (AMBIEN) 10 MG tablet  03/11/21   [provider]      Allergies    Azelastine and Azelastine hcl    Review of Systems   Review of Systems  Physical Exam Updated Vital Signs BP (!) 146/99 (BP Location: Right Arm)   Pulse 87   Temp 99.4 F (37.4 C)   Resp 18   Wt 69.3 kg   LMP 09/05/2016 Comment: tubal ligation  SpO2 99%   BMI 24.66 kg/m   Physical Exam Vitals and nursing note reviewed.  Constitutional:      Appearance: She is well-developed.  HENT:     Head: Normocephalic and atraumatic.     Jaw: No trismus.     Right Ear: Tympanic membrane, ear canal and external ear normal.     Left Ear: Tympanic membrane, ear canal and external ear normal.     Nose: Congestion present. No mucosal edema or rhinorrhea.     Mouth/Throat:     Mouth: Mucous membranes are moist. Mucous membranes are not dry. No oral lesions.     Pharynx: Uvula midline. Posterior oropharyngeal erythema present. No oropharyngeal exudate or uvula swelling.     Tonsils: No tonsillar abscesses.  Eyes:     General:        Right eye: No discharge.        Left eye: No discharge.     Conjunctiva/sclera: Conjunctivae normal.  Cardiovascular:     Rate and Rhythm: Normal rate and regular rhythm.     Heart sounds: Normal heart sounds.  Pulmonary:     Effort: Pulmonary effort is normal. No respiratory distress.     Breath sounds: Normal breath sounds. No wheezing or rales.  Abdominal:     Palpations: Abdomen is soft.     Tenderness: There is no abdominal tenderness.  Musculoskeletal:     Cervical back: Normal range of motion and neck supple.  Lymphadenopathy:     Cervical: No cervical adenopathy.  Skin:    General: Skin is warm and dry.  Neurological:     Mental Status: She is alert.   Psychiatric:        Mood and Affect: Mood normal.     ED Results / Procedures / Treatments   Labs (all labs ordered are listed, but only abnormal results are displayed) Labs Reviewed  RESP PANEL BY RT-PCR (RSV, FLU A&B, COVID)  RVPGX2 - Abnormal; Notable for the following components:      Result Value   Influenza A by PCR POSITIVE (*)    All other components within normal limits    EKG None  Radiology No results found.  Procedures Procedures    Medications Ordered in ED Medications - No data to display  ED Course/ Medical Decision Making/ A&P    Patient seen and examined. History obtained directly from patient. Work-up including labs, imaging, EKG ordered in triage, if performed, were reviewed.    Labs/EKG: Independently reviewed and interpreted.  This included: Viral panel positive for flu, negative COVID and RSV  Imaging: None ordered  Medications/Fluids: None ordered  Most recent vital signs reviewed and are as follows: BP (!) 140/104   Pulse 80   Temp 99.4 F (37.4 C)   Resp 16   Wt 69.3 kg   LMP 09/05/2016 Comment: tubal ligation  SpO2 100%   BMI 24.66 kg/m   Initial impression: Influenza A  Patient discharged to home. Encouraged to rest and drink plenty of fluids.  Patient told to return to ED or see their primary doctor if their symptoms worsen, high fever not controlled with tylenol, persistent vomiting, they feel they are dehydrated, or if they have any other concerns.  Patient verbalized understanding and agreed with plan.                                  Medical Decision Making  Patient with symptoms consistent with influenza. Vitals are stable, low-grade fever. No signs of dehydration, tolerating PO's. Lungs are clear. Supportive therapy indicated with return if symptoms worsen. Patient counseled.        Final Clinical Impression(s) / ED Diagnoses Final diagnoses:  Influenza A    Rx / DC Orders ED Discharge Orders     None          Renne Crigler, PA-C 04/14/23 1139    Terald Sleeper, MD 04/14/23 [id number]

## 2023-04-14 NOTE — Discharge Instructions (Signed)
Please read and follow all provided instructions.  Your diagnoses today include:  1. Influenza A    Tests performed today include: Flu, COVID, RSV testing: Positive for influenza A Vital signs. See below for your results today.   Medications prescribed:  Please use over-the-counter NSAID medications (ibuprofen, naproxen) or Tylenol (acetaminophen) as directed on the packaging for pain -- as long as you do not have any reasons avoid these medications. Reasons to avoid NSAID medications include: weak kidneys, a history of bleeding in your stomach or gut, or uncontrolled high blood pressure or previous heart attack. Reasons to avoid Tylenol include: liver problems or ongoing alcohol use. Never take more than 4000mg  or 8 Extra strength Tylenol in a 24 hour period.     Take any prescribed medications only as directed.  Home care instructions:  Follow any educational materials contained in this packet. Please continue drinking plenty of fluids. Use over-the-counter cold and flu medications as needed as directed on packaging for symptom relief. You may also use ibuprofen or tylenol as directed on packaging for pain or fever.   BE VERY CAREFUL not to take multiple medicines containing Tylenol (also called acetaminophen). Doing so can lead to an overdose which can damage your liver and cause liver failure and possibly death.   Follow-up instructions: Please follow-up with your primary care provider in the next 5 days for further evaluation of your symptoms if not improving.   Return instructions:  Please return to the Emergency Department if you experience worsening symptoms. Please return if you have a high fever greater than 101 degrees not controlled with over-the-counter medications, persistent vomiting and cannot keep down fluids, or worsening trouble breathing. Please return if you have any other emergent concerns.  Additional Information:  Your vital signs today were: BP (!) 146/99 (BP  Location: Right Arm)   Pulse 87   Temp 99.4 F (37.4 C)   Resp 18   Wt 69.3 kg   LMP 09/05/2016 Comment: tubal ligation  SpO2 99%   BMI 24.66 kg/m  If your blood pressure (BP) was elevated above 135/85 this visit, please have this repeated by your doctor within one month.

## 2023-04-14 NOTE — ED Triage Notes (Signed)
Pt c/o HA, congestion and runny nose, sore throat and cough x 3 days.

## 2023-05-17 DIAGNOSIS — N3946 Mixed incontinence: Secondary | ICD-10-CM | POA: Diagnosis not present

## 2023-05-17 DIAGNOSIS — R350 Frequency of micturition: Secondary | ICD-10-CM | POA: Diagnosis not present

## 2023-06-11 DIAGNOSIS — H5213 Myopia, bilateral: Secondary | ICD-10-CM | POA: Diagnosis not present

## 2023-07-27 DIAGNOSIS — N3946 Mixed incontinence: Secondary | ICD-10-CM | POA: Diagnosis not present

## 2023-07-27 DIAGNOSIS — R350 Frequency of micturition: Secondary | ICD-10-CM | POA: Diagnosis not present

## 2023-08-15 ENCOUNTER — Other Ambulatory Visit: Payer: Self-pay | Admitting: Urology

## 2023-08-19 NOTE — Progress Notes (Addendum)
 Anesthesia Review:  PCP: Gilda Labor at [facility]  Cardiologist : none   PPM/ ICD: Device Orders: Rep Notified:  Chest x-ray : EKG : 03/29/2023  Echo : Stress test: Cardiac Cath :   Activity level: can do a flight of stairs without difficutly  Sleep Study/ CPAP : none  Fasting Blood Sugar :      / Checks Blood Sugar -- times a day:    Blood Thinner/ Instructions /Last Dose: ASA / Instructions/ Last Dose :    Preop appt completed via phone call on 08/24/2023.  Med hx and preop instructions completed.  PT voiced understanDing.  At end of appt instructed pt to call Admitting at [phone].  PT voiced understanding.     03/29/23- Botox  bladder    PT with hx of hypertension.  Does not take Amlodipine  on a regular basis.  Last dose approx month ago.  PT periodically checks blood pressure at home approx once per week.   PT smoker, vapes and uses marijuana.

## 2023-08-19 NOTE — Patient Instructions (Signed)
 SURGICAL WAITING ROOM VISITATION  Patients having surgery or a procedure may have no more than 2 support people in the waiting area - these visitors may rotate.    Children under the age of 76 must have an adult with them who is not the patient.  Visitors with respiratory illnesses are discouraged from visiting and should remain at home.  If the patient needs to stay at the hospital during part of their recovery, the visitor guidelines for inpatient rooms apply. Pre-op nurse will coordinate an appropriate time for 1 support person to accompany patient in pre-op.  This support person may not rotate.    Please refer to the [facility] website for the visitor guidelines for Inpatients (after your surgery is over and you are in a regular room).       Your procedure is scheduled on:  09/06/23    Report to [facility] Main Entrance    Report to admitting at   0600AM   Call this number if you have problems the morning of surgery [phone]   Do not eat food  or drink liquids :After Midnight.                If you have questions, please contact your surgeon's office.        Oral Hygiene is also important to reduce your risk of infection.                                    Remember - BRUSH YOUR TEETH THE MORNING OF SURGERY WITH YOUR REGULAR TOOTHPASTE  DENTURES WILL BE REMOVED PRIOR TO SURGERY PLEASE DO NOT APPLY "Poly grip" OR ADHESIVES!!!   Do NOT smoke after Midnight   Stop all vitamins and herbal supplements 7 days before surgery.   Take these medicines the morning of surgery with A SIP OF WATER :  inhalers as usual and bring, vesicare   DO NOT TAKE ANY ORAL DIABETIC MEDICATIONS DAY OF YOUR SURGERY  Bring CPAP mask and tubing day of surgery.                              You may not have any metal on your body including hair pins, jewelry, and body piercing             Do not wear make-up, lotions, powders, perfumes/cologne, or deodorant  Do not wear nail  polish including gel and S&S, artificial/acrylic nails, or any other type of covering on natural nails including finger and toenails. If you have artificial nails, gel coating, etc. that needs to be removed by a nail salon please have this removed prior to surgery or surgery may need to be canceled/ delayed if the surgeon/ anesthesia feels like they are unable to be safely monitored.   Do not shave  48 hours prior to surgery.               Men may shave face and neck.   Do not bring valuables to the hospital. [facility] IS NOT             RESPONSIBLE   FOR VALUABLES.   Contacts, glasses, dentures or bridgework may not be worn into surgery.   Bring small overnight bag day of surgery.   DO NOT BRING YOUR HOME MEDICATIONS TO THE HOSPITAL. PHARMACY WILL DISPENSE MEDICATIONS LISTED ON YOUR MEDICATION  LIST TO YOU DURING YOUR ADMISSION IN THE HOSPITAL!    Patients discharged on the day of surgery will not be allowed to drive home.  Someone NEEDS to stay with you for the first 24 hours after anesthesia.   Special Instructions: Bring a copy of your healthcare power of attorney and living will documents the day of surgery if you haven't scanned them before.              Please read over the following fact sheets you were given: IF YOU HAVE QUESTIONS ABOUT YOUR PRE-OP INSTRUCTIONS PLEASE CALL [phone]   If you received a COVID test during your pre-op visit  it is requested that you wear a mask when out in public, stay away from anyone that may not be feeling well and notify your surgeon if you develop symptoms. If you test positive for Covid or have been in contact with anyone that has tested positive in the last 10 days please notify you surgeon.    [facility] - Preparing for Surgery Before surgery, you can play an important role.  Because skin is not sterile, your skin needs to be as free of germs as possible.  You can reduce the number of germs on your skin by washing with CHG (chlorahexidine  gluconate) soap before surgery.  CHG is an antiseptic cleaner which kills germs and bonds with the skin to continue killing germs even after washing. Please DO NOT use if you have an allergy to CHG or antibacterial soaps.  If your skin becomes reddened/irritated stop using the CHG and inform your nurse when you arrive at Short Stay. Do not shave (including legs and underarms) for at least 48 hours prior to the first CHG shower.  You may shave your face/neck. Please follow these instructions carefully:  1.  Shower with CHG Soap the night before surgery and the  morning of Surgery.  2.  If you choose to wash your hair, wash your hair first as usual with your  normal  shampoo.  3.  After you shampoo, rinse your hair and body thoroughly to remove the  shampoo.                           4.  Use CHG as you would any other liquid soap.  You can apply chg directly  to the skin and wash                       Gently with a scrungie or clean washcloth.  5.  Apply the CHG Soap to your body ONLY FROM THE NECK DOWN.   Do not use on face/ open                           Wound or open sores. Avoid contact with eyes, ears mouth and genitals (private parts).                       Wash face,  Genitals (private parts) with your normal soap.             6.  Wash thoroughly, paying special attention to the area where your surgery  will be performed.  7.  Thoroughly rinse your body with warm water  from the neck down.  8.  DO NOT shower/wash with your normal soap after using and rinsing off  the CHG Soap.  9.  Pat yourself dry with a clean towel.            10.  Wear clean pajamas.            11.  Place clean sheets on your bed the night of your first shower and do not  sleep with pets. Day of Surgery : Do not apply any lotions/deodorants the morning of surgery.  Please wear clean clothes to the hospital/surgery center.  FAILURE TO FOLLOW THESE INSTRUCTIONS MAY RESULT IN THE CANCELLATION OF YOUR  SURGERY PATIENT SIGNATURE_________________________________  NURSE SIGNATURE__________________________________  ________________________________________________________________________

## 2023-08-24 ENCOUNTER — Other Ambulatory Visit: Payer: Self-pay

## 2023-08-24 ENCOUNTER — Encounter (HOSPITAL_COMMUNITY): Payer: Self-pay

## 2023-08-24 ENCOUNTER — Encounter (HOSPITAL_COMMUNITY)
Admission: RE | Admit: 2023-08-24 | Discharge: 2023-08-24 | Disposition: A | Discharge status: Home / Self Care | Source: Ambulatory Visit | Attending: Urology | Admitting: Urology

## 2023-08-24 VITALS — Ht 64.0 in | Wt 165.0 lb

## 2023-08-24 DIAGNOSIS — Z01812 Encounter for preprocedural laboratory examination: Secondary | ICD-10-CM | POA: Diagnosis present

## 2023-08-24 DIAGNOSIS — Z01818 Encounter for other preprocedural examination: Secondary | ICD-10-CM

## 2023-08-24 HISTORY — DX: Cerebral infarction, unspecified: I63.9

## 2023-08-24 HISTORY — DX: Family history of other specified conditions: Z84.89

## 2023-08-24 HISTORY — DX: Unspecified osteoarthritis, unspecified site: M19.90

## 2023-08-24 HISTORY — DX: Pneumonia, unspecified organism: J18.9

## 2023-08-25 ENCOUNTER — Other Ambulatory Visit: Payer: Self-pay | Admitting: Family Medicine

## 2023-09-03 NOTE — H&P (Signed)
 Reviewed my last note. I last saw her in November 2024. Patient had Botox  100 units March 29, 2023. She was completely dry until recently. She has leak 3 times with urgency. Clinically not infected. Frequency stable. Urine reviewed and sent for culture   Patient's insurance would not cover Gemtesa but might if she fails other medications. Call if culture positive. I called in Vesicare 5 mg 3 x 11 and reassess in 8 weeks. She understands that many of these treatments are not perfect.   Today  Patient voids 3 times during an 8-hour day. When she does not take the Vesicare she will void every hour. She is now back to wearing a heavier pad for urge incontinence. She agrees that she benefit it and was initially dry but the effect is wearing off. Clinically not infected. Urine sent for culture. Recognizing that the treatment is not perfect she would like to try another Botox . I sent in a 3-day prescription of ciprofloxacin . She does have high expectation hopefully we can help her reach her treatment goal   This is done in the operating room due to insurance  Having said that we will see what we can do in the office now that she has a new job with insurance     ALLERGIES: Azelastine     MEDICATIONS: VESIcare 5 MG Tablet 1 tablet PO Daily     GU PSH: Complex cystometrogram, w/ void pressure and urethral pressure profile studies, any technique - 12/24/2021 Complex Uroflow - 12/24/2021 Cystoscopy - 12/29/2021 Emg surf Electrd - 12/24/2021 Inject For cystogram - 12/24/2021 Intrabd voidng Press - 12/24/2021       PSH Notes: cyst removal on wrist 2002   NON-GU PSH: Back Surgery (Unspecified) Laparoscopy; Salpingostomy Visit Complexity (formerly GPC1X) - 05/17/2023, 01/26/2023     GU PMH: Mixed incontinence - 05/17/2023, - 01/26/2023, - 12/29/2021, - 12/24/2021, - 11/19/2021 Urinary Frequency - 05/17/2023, - 01/26/2023, - 12/29/2021, - 11/19/2021 Nocturia - 11/19/2021    NON-GU PMH: Acute gastric ulcer  with hemorrhage Anxiety Arthritis Asthma Cardiac murmur, unspecified Depression Heartburn Hypertension Stroke/TIA    FAMILY HISTORY: No Family History    SOCIAL HISTORY: Marital Status: Divorced Preferred Language: English; Ethnicity: Not Hispanic Or Latino; Race: Black or African American Current Smoking Status: Patient smokes occasionally. Has smoked since 11/13/2001.   Tobacco Use Assessment Completed: Used Tobacco in last 30 days? Drinks 1 caffeinated drink per day. Patient's occupation is/was Patient [employer].    REVIEW OF SYSTEMS:    GU Review Female:   Patient reports frequent urination, hard to postpone urination, get up at night to urinate, and leakage of urine. Patient denies burning /pain with urination, stream starts and stops, trouble starting your stream, have to strain to urinate, and being pregnant.  Gastrointestinal (Upper):   Patient denies nausea, vomiting, and indigestion/ heartburn.  Gastrointestinal (Lower):   Patient denies diarrhea and constipation.  Constitutional:   Patient denies fever, night sweats, weight loss, and fatigue.  Skin:   Patient denies skin rash/ lesion and itching.  Eyes:   Patient denies blurred vision and double vision.  Ears/ Nose/ Throat:   Patient denies sore throat and sinus problems.  Hematologic/Lymphatic:   Patient denies swollen glands and easy bruising.  Cardiovascular:   Patient denies leg swelling and chest pains.  Respiratory:   Patient denies cough and shortness of breath.  Endocrine:   Patient denies excessive thirst.  Musculoskeletal:   Patient denies back pain and joint pain.  Neurological:   Patient  denies headaches and dizziness.  Psychologic:   Patient denies depression and anxiety.   VITAL SIGNS: None   PAST DATA REVIEW: None   PROCEDURES:          Visit Complexity - G2211          Urinalysis Dipstick Dipstick Cont'd  Color: Yellow Bilirubin: Neg mg/dL  Appearance: Clear Ketones: Neg mg/dL  Specific  Gravity: 8.974 Blood: Neg ery/uL  pH: <=5.0 Protein: Neg mg/dL  Glucose: Neg mg/dL Urobilinogen: 0.2 mg/dL    Nitrites: Neg    Leukocyte Esterase: Neg leu/uL    ASSESSMENT:      ICD-10 Details  1 GU:   Mixed incontinence - N39.46   2   Urinary Frequency - R35.0      PLAN:            Medications New Meds: Cipro  250 MG Tablet 1 tablet PO BID   #6  0 Refill(s)  Pharmacy Name:  [id number]  Address:  [address]   [place], [place] [id number]  Phone:  [phone]  Fax:  [phone]            Orders Labs CULTURE, URINE          Schedule Return Visit/Planned Activity: Return PRN - Office Visit

## 2023-09-05 NOTE — Anesthesia Preprocedure Evaluation (Signed)
 Anesthesia Evaluation  Patient identified by MRN, date of birth, ID band Patient awake    Reviewed: Allergy & Precautions, NPO status , Patient's Chart, lab work & pertinent test results  Airway Mallampati: II  TM Distance: >3 FB Neck ROM: Full    Dental  (+) Dental Advisory Given, Teeth Intact   Pulmonary asthma , Current Smoker and Patient abstained from smoking.   Pulmonary exam normal breath sounds clear to auscultation       Cardiovascular hypertension, Pt. on medications and Pt. on home beta blockers Normal cardiovascular exam Rhythm:Regular Rate:Normal     Neuro/Psych  PSYCHIATRIC DISORDERS Anxiety Depression Bipolar Disorder Schizophrenia  negative neurological ROS     GI/Hepatic Neg liver ROS, PUD,GERD  Medicated,,  Endo/Other  negative endocrine ROS    Renal/GU negative Renal ROS     Musculoskeletal  (+) Arthritis ,    Abdominal   Peds  Hematology negative hematology ROS (+)   Anesthesia Other Findings   Reproductive/Obstetrics negative OB ROS                             Anesthesia Physical Anesthesia Plan  ASA: 2  Anesthesia Plan: General   Post-op Pain Management: Tylenol  PO (pre-op)*   Induction: Intravenous  PONV Risk Score and Plan: 4 or greater and Ondansetron , Dexamethasone , Treatment may vary due to age or medical condition and Midazolam   Airway Management Planned: LMA  Additional Equipment: None  Intra-op Plan:   Post-operative Plan: Extubation in OR  Informed Consent: I have reviewed the patients History and Physical, chart, labs and discussed the procedure including the risks, benefits and alternatives for the proposed anesthesia with the patient or authorized representative who has indicated his/her understanding and acceptance.     Dental advisory given  Plan Discussed with: CRNA and Anesthesiologist  Anesthesia Plan Comments:          Anesthesia Quick Evaluation

## 2023-09-06 ENCOUNTER — Encounter (HOSPITAL_COMMUNITY)
Admission: RE | Disposition: A | Discharge status: Home / Self Care | Payer: Self-pay | Source: Home / Self Care | Attending: Urology

## 2023-09-06 ENCOUNTER — Encounter (HOSPITAL_COMMUNITY): Payer: Self-pay | Admitting: Urology

## 2023-09-06 ENCOUNTER — Ambulatory Visit (HOSPITAL_BASED_OUTPATIENT_CLINIC_OR_DEPARTMENT_OTHER): Payer: Self-pay | Admitting: Anesthesiology

## 2023-09-06 ENCOUNTER — Ambulatory Visit (HOSPITAL_COMMUNITY)
Admission: RE | Admit: 2023-09-06 | Discharge: 2023-09-06 | Disposition: A | Discharge status: Home / Self Care | Attending: Urology | Admitting: Urology

## 2023-09-06 ENCOUNTER — Ambulatory Visit (HOSPITAL_COMMUNITY): Payer: Self-pay | Admitting: Physician Assistant

## 2023-09-06 DIAGNOSIS — K219 Gastro-esophageal reflux disease without esophagitis: Secondary | ICD-10-CM | POA: Insufficient documentation

## 2023-09-06 DIAGNOSIS — N3592 Unspecified urethral stricture, female: Secondary | ICD-10-CM | POA: Insufficient documentation

## 2023-09-06 DIAGNOSIS — N3946 Mixed incontinence: Secondary | ICD-10-CM | POA: Insufficient documentation

## 2023-09-06 DIAGNOSIS — Z79899 Other long term (current) drug therapy: Secondary | ICD-10-CM | POA: Diagnosis not present

## 2023-09-06 DIAGNOSIS — Z01818 Encounter for other preprocedural examination: Secondary | ICD-10-CM

## 2023-09-06 DIAGNOSIS — R350 Frequency of micturition: Secondary | ICD-10-CM | POA: Insufficient documentation

## 2023-09-06 DIAGNOSIS — N3941 Urge incontinence: Secondary | ICD-10-CM | POA: Diagnosis not present

## 2023-09-06 DIAGNOSIS — I10 Essential (primary) hypertension: Secondary | ICD-10-CM | POA: Diagnosis not present

## 2023-09-06 DIAGNOSIS — Z8673 Personal history of transient ischemic attack (TIA), and cerebral infarction without residual deficits: Secondary | ICD-10-CM | POA: Insufficient documentation

## 2023-09-06 HISTORY — PX: CYSTOSCOPY WITH INJECTION: SHX1424

## 2023-09-06 LAB — BASIC METABOLIC PANEL WITH GFR
Anion gap: 10 (ref 5–15)
BUN: 8 mg/dL (ref 6–20)
CO2: 27 mmol/L (ref 22–32)
Calcium: 9.6 mg/dL (ref 8.9–10.3)
Chloride: 103 mmol/L (ref 98–111)
Creatinine, Ser: 0.85 mg/dL (ref 0.44–1.00)
GFR, Estimated: >60 mL/min (ref >60)
Glucose, Bld: 96 mg/dL (ref 70–99)
Potassium: 3.8 mmol/L (ref 3.5–5.1)
Sodium: 140 mmol/L (ref 135–145)

## 2023-09-06 LAB — CBC
HCT: 42.9 % (ref 36.0–46.0)
Hemoglobin: 14.4 g/dL (ref 12.0–15.0)
MCH: 31.6 pg (ref 26.0–34.0)
MCHC: 33.6 g/dL (ref 30.0–36.0)
MCV: 94.1 fL (ref 80.0–100.0)
Platelets: 244 10*3/uL (ref 150–400)
RBC: 4.56 MIL/uL (ref 3.87–5.11)
RDW: 14 % (ref 11.5–15.5)
WBC: 5.8 10*3/uL (ref 4.0–10.5)
nRBC: 0 % (ref 0.0–0.2)

## 2023-09-06 SURGERY — CYSTOSCOPY, WITH INJECTION OF BLADDER NECK OR BLADDER WALL
Anesthesia: General

## 2023-09-06 MED ORDER — OXYCODONE HCL 5 MG PO TABS: 5.0000 mg | ORAL_TABLET | Freq: Once | ORAL | Status: DC | PRN

## 2023-09-06 MED ORDER — HYDRALAZINE HCL 20 MG/ML IJ SOLN
10.0000 mg | Freq: Once | INTRAMUSCULAR | Status: AC
  Administered 2023-09-06: 10 mg via INTRAVENOUS

## 2023-09-06 MED ORDER — FENTANYL CITRATE (PF) 100 MCG/2ML IJ SOLN
INTRAMUSCULAR | Status: AC
  Filled 2023-09-06: qty 2

## 2023-09-06 MED ORDER — LIDOCAINE HCL (PF) 2 % IJ SOLN
INTRAMUSCULAR | Status: DC | PRN
  Administered 2023-09-06: 100 mg via INTRADERMAL

## 2023-09-06 MED ORDER — CIPROFLOXACIN IN D5W 400 MG/200ML IV SOLN
400.0000 mg | INTRAVENOUS | Status: AC
  Administered 2023-09-06: 400 mg via INTRAVENOUS
  Filled 2023-09-06: qty 200

## 2023-09-06 MED ORDER — SODIUM CHLORIDE 0.9 % IR SOLN
Status: DC | PRN
  Administered 2023-09-06: 3000 mL

## 2023-09-06 MED ORDER — OXYCODONE HCL 5 MG/5ML PO SOLN: 5.0000 mg | Freq: Once | ORAL | Status: DC | PRN

## 2023-09-06 MED ORDER — MIDAZOLAM HCL 2 MG/2ML IJ SOLN
INTRAMUSCULAR | Status: DC | PRN
  Administered 2023-09-06: 2 mg via INTRAVENOUS

## 2023-09-06 MED ORDER — ONDANSETRON HCL 4 MG/2ML IJ SOLN
INTRAMUSCULAR | Status: DC | PRN
Start: 2023-09-06 — End: 2023-09-06
  Administered 2023-09-06: 4 mg via INTRAVENOUS

## 2023-09-06 MED ORDER — SODIUM CHLORIDE (PF) 0.9 % IJ SOLN
INTRAMUSCULAR | Status: AC
  Filled 2023-09-06: qty 20

## 2023-09-06 MED ORDER — PROPOFOL 10 MG/ML IV BOLUS
INTRAVENOUS | Status: AC
  Filled 2023-09-06: qty 20

## 2023-09-06 MED ORDER — ONABOTULINUMTOXINA 100 UNITS IJ SOLR
INTRAMUSCULAR | Status: AC
  Filled 2023-09-06: qty 200

## 2023-09-06 MED ORDER — ONABOTULINUMTOXINA 100 UNITS IJ SOLR
INTRAMUSCULAR | Status: DC | PRN
Start: 2023-09-06 — End: 2023-09-06
  Administered 2023-09-06: 100 [IU] via INTRAMUSCULAR

## 2023-09-06 MED ORDER — LIDOCAINE HCL (PF) 2 % IJ SOLN
INTRAMUSCULAR | Status: AC
Start: 2023-09-06 — End: 2023-09-06
  Filled 2023-09-06: qty 5

## 2023-09-06 MED ORDER — HYDRALAZINE HCL 20 MG/ML IJ SOLN
INTRAMUSCULAR | Status: AC
  Filled 2023-09-06: qty 1

## 2023-09-06 MED ORDER — MEPERIDINE HCL 50 MG/ML IJ SOLN: 6.2500 mg | INTRAMUSCULAR | Status: DC | PRN

## 2023-09-06 MED ORDER — ONDANSETRON HCL 4 MG/2ML IJ SOLN: 4.0000 mg | Freq: Once | INTRAMUSCULAR | Status: DC | PRN

## 2023-09-06 MED ORDER — DEXAMETHASONE SODIUM PHOSPHATE 10 MG/ML IJ SOLN
INTRAMUSCULAR | Status: DC | PRN
  Administered 2023-09-06: 10 mg via INTRAVENOUS

## 2023-09-06 MED ORDER — DEXAMETHASONE SODIUM PHOSPHATE 10 MG/ML IJ SOLN
INTRAMUSCULAR | Status: AC
Start: 2023-09-06 — End: 2023-09-06
  Filled 2023-09-06: qty 1

## 2023-09-06 MED ORDER — CHLORHEXIDINE GLUCONATE 0.12 % MT SOLN
15.0000 mL | Freq: Once | OROMUCOSAL | Status: AC
  Administered 2023-09-06: 15 mL via OROMUCOSAL

## 2023-09-06 MED ORDER — ACETAMINOPHEN 500 MG PO TABS
1000.0000 mg | ORAL_TABLET | Freq: Once | ORAL | Status: AC
  Administered 2023-09-06: 1000 mg via ORAL
  Filled 2023-09-06: qty 2

## 2023-09-06 MED ORDER — ORAL CARE MOUTH RINSE: 15.0000 mL | Freq: Once | OROMUCOSAL | Status: AC

## 2023-09-06 MED ORDER — ONDANSETRON HCL 4 MG/2ML IJ SOLN
INTRAMUSCULAR | Status: AC
  Filled 2023-09-06: qty 2

## 2023-09-06 MED ORDER — PROPOFOL 500 MG/50ML IV EMUL
INTRAVENOUS | Status: DC | PRN
  Administered 2023-09-06: 200 mg via INTRAVENOUS

## 2023-09-06 MED ORDER — FENTANYL CITRATE PF 50 MCG/ML IJ SOSY: 25.0000 ug | PREFILLED_SYRINGE | INTRAMUSCULAR | Status: DC | PRN

## 2023-09-06 MED ORDER — MIDAZOLAM HCL 2 MG/2ML IJ SOLN
INTRAMUSCULAR | Status: AC
  Filled 2023-09-06: qty 2

## 2023-09-06 MED ORDER — LACTATED RINGERS IV SOLN: INTRAVENOUS | Status: DC

## 2023-09-06 SURGICAL SUPPLY — 13 items
BAG URO CATCHER STRL LF (MISCELLANEOUS) ×1 IMPLANT
CLOTH BEACON ORANGE TIMEOUT ST (SAFETY) ×1 IMPLANT
GLOVE SURG LX STRL 7.5 STRW (GLOVE) ×1 IMPLANT
GOWN STRL REUS W/ TWL XL LVL3 (GOWN DISPOSABLE) ×1 IMPLANT
KIT TURNOVER KIT A (KITS) ×1 IMPLANT
NDL ASPIRATION 22 (NEEDLE) ×1 IMPLANT
NDL SAFETY ECLIPSE 18X1.5 (NEEDLE) IMPLANT
NEEDLE ASPIRATION 22 (NEEDLE) ×1 IMPLANT
PACK CYSTO (CUSTOM PROCEDURE TRAY) ×1 IMPLANT
SYR 10ML LL (SYRINGE) IMPLANT
SYR CONTROL 10ML LL (SYRINGE) IMPLANT
TUBING CONNECTING 10 (TUBING) IMPLANT
WATER STERILE IRR 3000ML UROMA (IV SOLUTION) ×1 IMPLANT

## 2023-09-06 NOTE — Discharge Instructions (Signed)
 I have reviewed discharge instructions in detail with the patient. They will follow-up with me or their physician as scheduled. My nurse will also be calling the patients as per protocol.

## 2023-09-06 NOTE — Anesthesia Postprocedure Evaluation (Signed)
 Anesthesia Post Note  Patient: [name]  Procedure(s) Performed: CYSTOSCOPY, WITH INJECTION OF BLADDER NECK OR BLADDER WALL     Patient location during evaluation: PACU Anesthesia Type: General Level of consciousness: awake and alert Pain management: pain level controlled Vital Signs Assessment: post-procedure vital signs reviewed and stable Respiratory status: spontaneous breathing, nonlabored ventilation, respiratory function stable and patient connected to nasal cannula oxygen  Cardiovascular status: blood pressure returned to baseline and stable Postop Assessment: no apparent nausea or vomiting Anesthetic complications: no   No notable events documented.  Last Vitals:  Vitals:   09/06/23 0955 09/06/23 1006  BP: (!) 160/116 (!) 134/93  Pulse:    Resp:    Temp:    SpO2:      Last Pain:  Vitals:   09/06/23 0900  TempSrc:   PainSc: 0-No pain                 Artemus Romanoff

## 2023-09-06 NOTE — Interval H&P Note (Signed)
 History and Physical Interval Note:  09/06/2023 7:37 AM  [name]  has presented today for surgery, with the diagnosis of REFRACTORY URGE INCONTINENCE.  The various methods of treatment have been discussed with the patient and family. After consideration of risks, benefits and other options for treatment, the patient has consented to  Procedure(s) with comments: CYSTOSCOPY, WITH INJECTION OF BLADDER NECK OR BLADDER WALL (N/A) - INJECT 100 UNITS OF BOTOX  as a surgical intervention.  The patient's history has been reviewed, patient examined, no change in status, stable for surgery.  I have reviewed the patient's chart and labs.  Questions were answered to the patient's satisfaction.     Waunita Sandstrom A Megann Easterwood

## 2023-09-06 NOTE — Transfer of Care (Signed)
 Immediate Anesthesia Transfer of Care Note  Patient: [name]  Procedure(s) Performed: Procedure(s) with comments: CYSTOSCOPY, WITH INJECTION OF BLADDER NECK OR BLADDER WALL (N/A) - INJECT 100 UNITS OF BOTOX   Patient Location: PACU  Anesthesia Type:General  Level of Consciousness: Patient easily awoken, comfortable, cooperative, following commands, responds to stimulation.   Airway & Oxygen  Therapy: Patient spontaneously breathing, ventilating well, oxygen  via simple oxygen  mask.  Post-op Assessment: Report given to PACU RN, vital signs reviewed and stable, moving all extremities.   Post vital signs: Reviewed and stable.  Complications: No apparent anesthesia complications  Last Vitals:  Vitals Value Taken Time  BP 137/97 09/06/23 08:39  Temp    Pulse 65 09/06/23 08:40  Resp 18 09/06/23 08:42  SpO2 100 % 09/06/23 08:40  Vitals shown include unfiled device data.  Last Pain:  Vitals:   09/06/23 0630  TempSrc:   PainSc: 0-No pain         Complications: No notable events documented.

## 2023-09-06 NOTE — Anesthesia Procedure Notes (Signed)
 Procedure Name: LMA Insertion Date/Time: 09/06/2023 8:18 AM  Performed by: Joshua Vernell BROCKS, CRNAPre-anesthesia Checklist: Patient identified, Emergency Drugs available, Suction available and Patient being monitored Patient Re-evaluated:Patient Re-evaluated prior to induction Oxygen  Delivery Method: Circle system utilized Preoxygenation: Pre-oxygenation with 100% oxygen  Induction Type: IV induction Ventilation: Mask ventilation without difficulty LMA: LMA inserted LMA Size: 4.0 Number of attempts: 1 Placement Confirmation: positive ETCO2 and breath sounds checked- equal and bilateral Tube secured with: Tape Dental Injury: Teeth and Oropharynx as per pre-operative assessment

## 2023-09-06 NOTE — Op Note (Signed)
 Preoperative diagnosis: Refractory urgency incontinence Postoperative diagnoses refractory urgency incontinence Surgery: Cystoscopy ingestion of botulinum toxin Surgeon: Dr. Glendia Carlon Davidson  The patient consented the above procedure.  Preoperatively she was given antibiotics.  Extra care was taken leg positioning  ACMI cystoscope was utilized.  She had mild meatal stenosis but the scope with obturator went in nicely.  I injected 100 units of Botox  and 10 cc in normal saline in the lower third of the bladder sparing the trigone.  No bleeding.  Left bladder mildly full.  Follow-up as per protocol

## 2023-09-07 ENCOUNTER — Encounter (HOSPITAL_COMMUNITY): Payer: Self-pay | Admitting: Urology

## 2023-09-22 DIAGNOSIS — N3946 Mixed incontinence: Secondary | ICD-10-CM | POA: Diagnosis not present

## 2023-09-22 DIAGNOSIS — R351 Nocturia: Secondary | ICD-10-CM | POA: Diagnosis not present

## 2023-10-11 ENCOUNTER — Emergency Department (HOSPITAL_BASED_OUTPATIENT_CLINIC_OR_DEPARTMENT_OTHER): Admitting: Radiology

## 2023-10-11 ENCOUNTER — Emergency Department (HOSPITAL_BASED_OUTPATIENT_CLINIC_OR_DEPARTMENT_OTHER)
Admission: EM | Admit: 2023-10-11 | Discharge: 2023-10-11 | Disposition: A | Discharge status: Home / Self Care | Attending: Emergency Medicine | Admitting: Emergency Medicine

## 2023-10-11 ENCOUNTER — Encounter (HOSPITAL_BASED_OUTPATIENT_CLINIC_OR_DEPARTMENT_OTHER): Payer: Self-pay

## 2023-10-11 ENCOUNTER — Other Ambulatory Visit: Payer: Self-pay

## 2023-10-11 DIAGNOSIS — I10 Essential (primary) hypertension: Secondary | ICD-10-CM | POA: Diagnosis not present

## 2023-10-11 DIAGNOSIS — M79602 Pain in left arm: Secondary | ICD-10-CM | POA: Diagnosis not present

## 2023-10-11 DIAGNOSIS — M25512 Pain in left shoulder: Secondary | ICD-10-CM | POA: Insufficient documentation

## 2023-10-11 MED ORDER — OXYCODONE HCL 5 MG PO TABS: 5.0000 mg | ORAL_TABLET | Freq: Every evening | ORAL | 0 refills | Status: AC | PRN

## 2023-10-11 MED ORDER — AMLODIPINE BESYLATE 10 MG PO TABS: 10.0000 mg | ORAL_TABLET | Freq: Every day | ORAL | 0 refills | Status: AC

## 2023-10-11 MED ORDER — DEXAMETHASONE SODIUM PHOSPHATE 10 MG/ML IJ SOLN
10.0000 mg | Freq: Once | INTRAMUSCULAR | Status: AC
  Administered 2023-10-11: 10 mg via INTRAMUSCULAR
  Filled 2023-10-11: qty 1

## 2023-10-11 MED ORDER — PREDNISONE 10 MG PO TABS: 40.0000 mg | ORAL_TABLET | Freq: Every day | ORAL | 0 refills | Status: AC

## 2023-10-11 MED ORDER — KETOROLAC TROMETHAMINE 30 MG/ML IJ SOLN
30.0000 mg | Freq: Once | INTRAMUSCULAR | Status: AC
  Administered 2023-10-11: 30 mg via INTRAMUSCULAR
  Filled 2023-10-11: qty 1

## 2023-10-11 MED ORDER — IBUPROFEN 600 MG PO TABS: 600.0000 mg | ORAL_TABLET | Freq: Three times a day (TID) | ORAL | 0 refills | Status: AC | PRN

## 2023-10-11 NOTE — ED Triage Notes (Signed)
 Pt c/o L shoulder pain x mos, concern for torn rotator cuff. No known injury, just the normal wear & tear. Gabapentin  w no relief.  August 2nd follow up

## 2023-10-11 NOTE — ED Provider Notes (Signed)
 Patient made aware of elevated BP.  She has not taken her home BP meds for at least a month and needs refill of amlodipine  10 mg, which I sent to her pharmacy.  She'll need to monitor this at home.   Cottie Donnice PARAS, MD 10/11/23 [phone]

## 2023-10-11 NOTE — Discharge Instructions (Addendum)
 Follow up with your orthopedic appointment next week.

## 2023-10-11 NOTE — ED Notes (Signed)
 MD aware of pt bp. Pt being prescribed amlodipine .

## 2023-10-11 NOTE — ED Provider Notes (Signed)
 [place] EMERGENCY DEPARTMENT AT [facility] Provider Note   CSN: [id number] Arrival date & time: 10/11/23  8171     Patient presents with: Shoulder Pain (L)   [name] is a 43 y.o. female here with atraumatic left shoulder and arm pain x 1 month.  Started to left of neck and now radiates down left arm to left little finger.  She reports gabapentin  not helping at home.  Has ortho appointment in 1 week but feels she can't make it due to pain.  Pain is worse with arm movement.   HPI     Prior to Admission medications   Medication Sig Start Date End Date Taking? Authorizing Provider  albuterol  (VENTOLIN  HFA) 108 (90 Base) MCG/ACT inhaler Inhale 1-2 puffs into the lungs every 4 (four) hours as needed for wheezing or shortness of breath. 03/19/22   Mortenson, Ashley, MD  clobetasol ointment (TEMOVATE) 0.05 % APPLY TOPICALLY TO THE AFFECTED AREA TWICE DAILY FOR 4 WEEKS. AVOID FACE AND GROIN. NOT SAFE FOR LONG TERM USE 08/26/23   McDiarmid, Krystal BIRCH, MD  lidocaine  (XYLOCAINE ) 2 % solution Use as directed 15 mLs in the mouth or throat every 6 (six) hours as needed for mouth pain. Patient not taking: Reported on 08/18/2023 02/28/23   Nanavati, Ankit, MD  solifenacin (VESICARE) 5 MG tablet Take 5 mg by mouth daily. 05/17/23   [provider]  Spacer/Aero-Holding Chambers (AEROCHAMBER MV) inhaler Use as instructed 03/19/22   Van Knee, MD  zinc gluconate 50 MG tablet Take 50 mg by mouth daily.    [provider]    Allergies: Azelastine  and Azelastine  hcl    Review of Systems  Updated Vital Signs BP (!) 144/118   Pulse 80   Temp 98.2 F (36.8 C)   Resp 16   LMP 09/05/2016 Comment: tubal ligation  SpO2 100%   Physical Exam Constitutional:      General: She is not in acute distress. HENT:     Head: Normocephalic and atraumatic.  Eyes:     Conjunctiva/sclera: Conjunctivae normal.     Pupils: Pupils are equal, round, and reactive to light.   Cardiovascular:     Rate and Rhythm: Normal rate and regular rhythm.  Pulmonary:     Effort: Pulmonary effort is normal. No respiratory distress.  Musculoskeletal:     Comments: Limited ROM of left shoulder, able to extend to 90 degrees parallel and rotate cuff, ttp along AC joint and suprascapular region (left sided)  Skin:    General: Skin is warm and dry.  Neurological:     Mental Status: She is alert. Mental status is at baseline.     Comments: Paresthesias left 5th finger, no grip weakness, no wrist drop  Psychiatric:        Mood and Affect: Mood normal.        Behavior: Behavior normal.     (all labs ordered are listed, but only abnormal results are displayed) Labs Reviewed - No data to display  EKG: None  Radiology: DG Shoulder Left Result Date: 10/11/2023 CLINICAL DATA:  Shoulder pain EXAM: LEFT SHOULDER - 2+ VIEW COMPARISON:  CT chest abdomen and pelvis 07/05/2022. FINDINGS: There is sclerosis cystic change and osteophyte formation involves the acromion progressed from 2024. No acute fracture or dislocation identified. The joint spaces are maintained. IMPRESSION: Sclerosis cystic change and osteophyte formation involves the acromion progressed from 2024. Findings are likely degenerative. Electronically Signed   By: Greig Maple HERO.D.  On: 10/11/2023 19:00     Procedures   Medications Ordered in the ED  dexamethasone  (DECADRON ) injection 10 mg (has no administration in time range)  ketorolac  (TORADOL ) 30 MG/ML injection 30 mg (has no administration in time range)                                    Medical Decision Making Amount and/or Complexity of Data Reviewed Radiology: ordered.   Ddx includes bursitis vs frozen shoulder vs cervical radiculopathy vs other  No motor weakness on exam No indication for spinal MRI at this time  Xray reviewed with degenerative changes noted near A/C joint  IM meds ordered  Doubt DVT  Can follow up with ortho  outpatient     Final diagnoses:  Left arm pain    ED Discharge Orders     None          Cottie Donnice PARAS, MD 10/11/23 1930

## 2023-10-12 ENCOUNTER — Emergency Department (HOSPITAL_BASED_OUTPATIENT_CLINIC_OR_DEPARTMENT_OTHER)

## 2023-10-12 ENCOUNTER — Encounter (HOSPITAL_BASED_OUTPATIENT_CLINIC_OR_DEPARTMENT_OTHER): Payer: Self-pay

## 2023-10-12 ENCOUNTER — Other Ambulatory Visit: Payer: Self-pay

## 2023-10-12 ENCOUNTER — Telehealth: Payer: Self-pay

## 2023-10-12 ENCOUNTER — Emergency Department (HOSPITAL_BASED_OUTPATIENT_CLINIC_OR_DEPARTMENT_OTHER)
Admission: EM | Admit: 2023-10-12 | Discharge: 2023-10-12 | Disposition: A | Discharge status: Home / Self Care | Source: Ambulatory Visit | Attending: Emergency Medicine | Admitting: Emergency Medicine

## 2023-10-12 DIAGNOSIS — R519 Headache, unspecified: Secondary | ICD-10-CM

## 2023-10-12 DIAGNOSIS — Z79899 Other long term (current) drug therapy: Secondary | ICD-10-CM | POA: Diagnosis not present

## 2023-10-12 DIAGNOSIS — I10 Essential (primary) hypertension: Secondary | ICD-10-CM | POA: Diagnosis not present

## 2023-10-12 MED ORDER — METOCLOPRAMIDE HCL 5 MG/ML IJ SOLN
10.0000 mg | Freq: Once | INTRAMUSCULAR | Status: AC
  Administered 2023-10-12: 10 mg via INTRAVENOUS
  Filled 2023-10-12: qty 2

## 2023-10-12 MED ORDER — DIPHENHYDRAMINE HCL 50 MG/ML IJ SOLN
25.0000 mg | Freq: Once | INTRAMUSCULAR | Status: AC
  Administered 2023-10-12: 25 mg via INTRAVENOUS
  Filled 2023-10-12: qty 1

## 2023-10-12 NOTE — Discharge Instructions (Signed)
 Please read and follow all provided instructions.  Your diagnoses today include:  1. Acute nonintractable headache, unspecified headache type   2. Hypertension, unspecified type     Tests performed today include: CT of your head which was normal and did not show any serious cause of your headache Vital signs. See below for your results today.   Medications:  In the Emergency Department you received: Reglan  - antinausea/headache medication Benadryl  - antihistamine to counteract potential side effects of reglan   Take any prescribed medications only as directed.  Additional information:  Follow any educational materials contained in this packet.  You are having a headache. No specific cause was found today for your headache. It may have been a migraine or other cause of headache. Stress, anxiety, fatigue, and depression are common triggers for headaches.   Your headache today does not appear to be life-threatening or require hospitalization, but often the exact cause of headaches is not determined in the emergency department. Therefore, follow-up with your doctor is very important to find out what may have caused your headache and whether or not you need any further diagnostic testing or treatment.   Sometimes headaches can appear benign (not harmful), but then more serious symptoms can develop which should prompt an immediate re-evaluation by your doctor or the emergency department.  BE VERY CAREFUL not to take multiple medicines containing Tylenol  (also called acetaminophen ). Doing so can lead to an overdose which can damage your liver and cause liver failure and possibly death.   Follow-up instructions: Please follow-up with your primary care provider in the next 3 days for further evaluation of your symptoms.   Return instructions:  Please return to the Emergency Department if you experience worsening symptoms. Return if the medications do not resolve your headache, if it recurs,  or if you have multiple episodes of vomiting or cannot keep down fluids. Return if you have a change from the usual headache. RETURN IMMEDIATELY IF you: Develop a sudden, severe headache Develop confusion or become poorly responsive or faint Develop a fever above 100.8F or problem breathing Have a change in speech, vision, swallowing, or understanding Develop new weakness, numbness, tingling, incoordination in your arms or legs Have a seizure Please return if you have any other emergent concerns.  Additional Information:  Your vital signs today were: BP (!) 150/106   Pulse 80   Temp 98.3 F (36.8 C) (Oral)   Resp 19   LMP 09/05/2016 Comment: tubal ligation  SpO2 100%  If your blood pressure (BP) was elevated above 135/85 this visit, please have this repeated by your doctor within one month. --------------

## 2023-10-12 NOTE — ED Provider Notes (Signed)
 [place] EMERGENCY DEPARTMENT AT [facility] Provider Note   CSN: [id number] Arrival date & time: 10/12/23  1016     Patient presents with: Hypertension   [name] is a 43 y.o. female.    Patient with history of hypertension --presents to the emergency department today for evaluation of headache and elevated blood pressure.  Patient was seen in the emergency department for musculoskeletal left shoulder pain.  This pain is worse with movement.  She was prescribed NSAID, oxycodone  for severe pain, steroid trial.  Patient was also restarted on amlodipine  10 mg which she had been off of for few weeks.  She called her PCP today.  She informed them of elevated blood pressure and headache and was referred to the emergency department.  Patient reports intermittent right temporal headache for about a month.  This is a new problem for her as she does not typically get headaches. Patient denies signs of stroke including: facial droop, slurred speech, aphasia, weakness/numbness in extremities, imbalance/trouble walking, vision change or loss..  She drove herself to the emergency department today.  No falls or injuries.          Prior to Admission medications   Medication Sig Start Date End Date Taking? Authorizing Provider  solifenacin (VESICARE) 10 MG tablet Take 10 mg by mouth daily. 09/22/23  Yes [provider]  albuterol  (VENTOLIN  HFA) 108 (90 Base) MCG/ACT inhaler Inhale 1-2 puffs into the lungs every 4 (four) hours as needed for wheezing or shortness of breath. 03/19/22   Mortenson, Ashley, MD  amLODipine  (NORVASC ) 10 MG tablet Take 1 tablet (10 mg total) by mouth daily. 10/11/23 11/10/23  Cottie Donnice PARAS, MD  clobetasol ointment (TEMOVATE) 0.05 % APPLY TOPICALLY TO THE AFFECTED AREA TWICE DAILY FOR 4 WEEKS. AVOID FACE AND GROIN. NOT SAFE FOR LONG TERM USE 08/26/23   McDiarmid, Krystal BIRCH, MD  ibuprofen  (ADVIL ) 600 MG tablet Take 1 tablet (600 mg total) by mouth every 8  (eight) hours as needed for up to 30 doses for mild pain (pain score 1-3) or moderate pain (pain score 4-6). 10/11/23   Cottie Donnice PARAS, MD  lidocaine  (XYLOCAINE ) 2 % solution Use as directed 15 mLs in the mouth or throat every 6 (six) hours as needed for mouth pain. Patient not taking: Reported on 08/18/2023 02/28/23   Charlyn Sora, MD  oxyCODONE  (ROXICODONE ) 5 MG immediate release tablet Take 1 tablet (5 mg total) by mouth at bedtime as needed for up to 7 doses for severe pain (pain score 7-10). 10/11/23   Cottie Donnice PARAS, MD  predniSONE  (DELTASONE ) 10 MG tablet Take 4 tablets (40 mg total) by mouth daily with breakfast for 4 days. 10/12/23 10/16/23  Cottie Donnice PARAS, MD  solifenacin (VESICARE) 5 MG tablet Take 5 mg by mouth daily. 05/17/23   [provider]  Spacer/Aero-Holding Chambers (AEROCHAMBER MV) inhaler Use as instructed 03/19/22   Van Knee, MD  zinc gluconate 50 MG tablet Take 50 mg by mouth daily.    [provider]    Allergies: Azelastine  and Azelastine  hcl    Review of Systems  Updated Vital Signs BP (!) 176/117 (BP Location: Right Arm)   Pulse 92   Temp 98.3 F (36.8 C) (Oral)   Resp 18   LMP 09/05/2016 Comment: tubal ligation  SpO2 100%   Physical Exam Vitals and nursing note reviewed.  Constitutional:      Appearance: She is well-developed.  HENT:     Head: Normocephalic  and atraumatic.     Right Ear: External ear normal.     Left Ear: External ear normal.     Nose: Nose normal.     Mouth/Throat:     Pharynx: Uvula midline.  Eyes:     General: Lids are normal.     Extraocular Movements:     Right eye: No nystagmus.     Left eye: No nystagmus.     Conjunctiva/sclera: Conjunctivae normal.     Pupils: Pupils are equal, round, and reactive to light.  Cardiovascular:     Rate and Rhythm: Normal rate and regular rhythm.  Pulmonary:     Effort: Pulmonary effort is normal.     Breath sounds: Normal breath sounds.  Abdominal:      Palpations: Abdomen is soft.     Tenderness: There is no abdominal tenderness.  Musculoskeletal:     Cervical back: Normal range of motion and neck supple. No tenderness or bony tenderness.     Comments: Patient winces with palpation over the left shoulder and when she pulls me with her left arm.  Skin:    General: Skin is warm and dry.  Neurological:     Mental Status: She is alert and oriented to person, place, and time.     GCS: GCS eye subscore is 4. GCS verbal subscore is 5. GCS motor subscore is 6.     Cranial Nerves: No cranial nerve deficit.     Sensory: No sensory deficit.     Motor: No weakness.     Coordination: Coordination normal.     Gait: Gait normal.     Comments: Upper extremity myotomes tested bilaterally:  C5 Shoulder abduction 5/5 C6 Elbow flexion/wrist extension 5/5 C7 Elbow extension 5/5 C8 Finger flexion 5/5 T1 Finger abduction 5/5  Lower extremity myotomes tested bilaterally: L2 Hip flexion 5/5 L3 Knee extension 5/5 L4 Ankle dorsiflexion 5/5 S1 Ankle plantar flexion 5/5      (all labs ordered are listed, but only abnormal results are displayed) Labs Reviewed - No data to display  EKG: None  Radiology: CT Head Wo Contrast Result Date: 10/12/2023 CLINICAL DATA:  Hypertension and new headache. EXAM: CT HEAD WITHOUT CONTRAST TECHNIQUE: Contiguous axial images were obtained from the base of the skull through the vertex without intravenous contrast. RADIATION DOSE REDUCTION: This exam was performed according to the departmental dose-optimization program which includes automated exposure control, adjustment of the mA and/or kV according to patient size and/or use of iterative reconstruction technique. COMPARISON:  07/05/2022 FINDINGS: Brain: Ventricles, cisterns and other CSF spaces are normal. There is no mass, mass effect, shift of midline structures or acute hemorrhage. No evidence of acute infarction. Vascular: No hyperdense vessel or unexpected  calcification. Skull: Normal. Negative for fracture or focal lesion. Sinuses/Orbits: Hypoplastic frontal sinuses as the paranasal sinuses are otherwise unremarkable. Orbits are normal symmetric. Other: None. IMPRESSION: No acute findings. Electronically Signed   By: Toribio Agreste M.D.   On: 10/12/2023 11:42   DG Shoulder Left Result Date: 10/11/2023 CLINICAL DATA:  Shoulder pain EXAM: LEFT SHOULDER - 2+ VIEW COMPARISON:  CT chest abdomen and pelvis 07/05/2022. FINDINGS: There is sclerosis cystic change and osteophyte formation involves the acromion progressed from 2024. No acute fracture or dislocation identified. The joint spaces are maintained. IMPRESSION: Sclerosis cystic change and osteophyte formation involves the acromion progressed from 2024. Findings are likely degenerative. Electronically Signed   By: Greig Pique M.D.   On: 10/11/2023 19:00  Procedures   Medications Ordered in the ED  metoCLOPramide  (REGLAN ) injection 10 mg (has no administration in time range)  diphenhydrAMINE  (BENADRYL ) injection 25 mg (has no administration in time range)   ED Course  Patient seen and examined. History obtained directly from patient.  Reviewed most recent labs and reviewed ED visit from yesterday.  Labs/EKG: None ordered  Imaging: Ordered CT head  Medications/Fluids: Ordered: Reglan /Benadryl   Most recent vital signs reviewed and are as follows: BP (!) 176/117 (BP Location: Right Arm)   Pulse 92   Temp 98.3 F (36.8 C) (Oral)   Resp 18   LMP 09/05/2016 Comment: tubal ligation  SpO2 100%   Initial impression: Headache in setting of hypertension, no neurologic deficits.  Discussed treatment versus additional workup with imaging with patient.  As she does not typically get headaches, will proceed with imaging as she has not had this done recently.  She will call her mother for a ride and would like to get migraine cocktail.  We discussed that we will take a few days to a week or so for  the amlodipine  to fully take effect, agree with outpatient follow-up for titration.  12:44 PM Reassessment performed. Patient appears sleepy but stable.  She states that her headache is improved.  She is comfortable discharge to home.  Imaging personally visualized and interpreted including: CT head was negative for acute findings  Reviewed pertinent lab work and imaging with patient at bedside. Questions answered.   Most current vital signs reviewed and are as follows: BP (!) 150/106   Pulse 80   Temp 98.3 F (36.8 C) (Oral)   Resp 19   LMP 09/05/2016 Comment: tubal ligation  SpO2 100%   Plan: Discharge to home.   Prescriptions written for: None  Other home care instructions discussed: Rest, hydration, avoidance of triggers, continued use of amlodipine   ED return instructions discussed: Patient counseled to return if they have weakness in their arms or legs, slurred speech, trouble walking or talking, confusion, trouble with their balance, or if they have any other concerns. Patient verbalizes understanding and agrees with plan.   Follow-up instructions discussed: Patient encouraged to follow-up with their PCP in 7 days.                                   Medical Decision Making Amount and/or Complexity of Data Reviewed Radiology: ordered.  Risk Prescription drug management.   In regards to the patient's headache, critical differentials were considered including subarachnoid hemorrhage, intracerebral hemorrhage, epidural/subdural hematoma, pituitary apoplexy, vertebral/carotid artery dissection, giant cell arteritis, central venous thrombosis, reversible cerebral vasoconstriction, acute angle closure glaucoma, idiopathic intracranial hypertension, bacterial meningitis, viral encephalitis, carbon monoxide poisoning, posterior reversible encephalopathy syndrome, pre-eclampsia.   Reg flag symptoms related to these causes were considered including systemic symptoms (fever, weight  loss), neurologic symptoms (confusion, mental status change, vision change, associated seizure), acute or sudden thunderclap onset, patient age 93 or older with new or progressive headache, patient of any age with first headache or change in headache pattern, pregnant or postpartum status, history of HIV or other immunocompromise, history of cancer, headache occurring with exertion, associated neck or shoulder pain, associated traumatic injury, concurrent use of anticoagulation, family history of spontaneous SAH, and concurrent drug use.    Other benign, more common causes of headache were considered including migraine, tension-type headache, cluster headache, referred pain from other cause such as sinus infection, dental  pain, trigeminal neuralgia.   On exam, patient has a reassuring neuro exam including baseline mental status, no significant neck pain or meningeal signs, no signs of severe infection or fever. CT was negative.   The patient's vital signs, pertinent lab work and imaging were reviewed and interpreted as discussed in the ED course. Hospitalization was considered for further testing, treatments, or serial exams/observation. However as patient is well-appearing, has a stable exam over the course of their evaluation, and reassuring studies today, I do not feel that they warrant admission at this time. This plan was discussed with the patient who verbalizes agreement and comfort with this plan and seems reliable and able to return to the Emergency Department with worsening or changing symptoms.       Final diagnoses:  Acute nonintractable headache, unspecified headache type  Hypertension, unspecified type    ED Discharge Orders     None          Desiderio Chew, PA-C 10/12/23 1246    Lenor Hollering, MD 10/13/23 [phone]

## 2023-10-12 NOTE — Telephone Encounter (Signed)
Reviewed and agree with plan.

## 2023-10-12 NOTE — ED Triage Notes (Signed)
 Patient arrives with hypertension. She reports being here last night and had high blood pressure. She reports just starting taking her blood pressure medication. She states that it was 148/114 and her PCP she was told to come here. She reports headache. She denies chest pain and shortness of breath.

## 2023-10-12 NOTE — Telephone Encounter (Signed)
 Patient calls nurse line reporting high blood pressure.   She reports she went to the ED last night for shoulder pain, however discovered her blood pressure was 114/118. She reports she was discharged with Amlodipine  10mg .   She reports she woke up this morning and took BP with her home monitor, 148/110. She reports she immediately took Amlodipine . She reports she waited ~60 minutes and checked BP again, 148/114.  She reports she endorses a headache, however reports this has been on going for ~ 1 month.   She denies any shortness of breath, vision changes or chest pains.   Advised evaluation today. She reports she will go back to ED at [facility]. Advised to take her BP monitor with her to check for accuracy.   She agreed with plan.

## 2023-10-20 ENCOUNTER — Ambulatory Visit
Admission: RE | Admit: 2023-10-20 | Discharge: 2023-10-20 | Disposition: A | Discharge status: Home / Self Care | Source: Ambulatory Visit | Attending: Physical Medicine & Rehabilitation | Admitting: Physical Medicine & Rehabilitation

## 2023-10-20 ENCOUNTER — Encounter: Attending: Physical Medicine & Rehabilitation | Admitting: Physical Medicine & Rehabilitation

## 2023-10-20 ENCOUNTER — Encounter: Payer: Self-pay | Admitting: Physical Medicine & Rehabilitation

## 2023-10-20 VITALS — BP 105/74 | HR 83 | Ht 64.0 in | Wt 153.2 lb

## 2023-10-20 DIAGNOSIS — M4722 Other spondylosis with radiculopathy, cervical region: Secondary | ICD-10-CM | POA: Diagnosis not present

## 2023-10-20 DIAGNOSIS — M47816 Spondylosis without myelopathy or radiculopathy, lumbar region: Secondary | ICD-10-CM | POA: Diagnosis not present

## 2023-10-20 DIAGNOSIS — M542 Cervicalgia: Secondary | ICD-10-CM

## 2023-10-20 MED ORDER — CYCLOBENZAPRINE HCL 10 MG PO TABS: 10.0000 mg | ORAL_TABLET | Freq: Every day | ORAL | 1 refills | Status: AC
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
## 2023-10-26 NOTE — Addendum Note (Signed)
 Addended by: CARILYN PRENTICE BRAVO on: 10/26/2023 12:13 PM   Modules accepted: Orders

## 2023-11-08 ENCOUNTER — Ambulatory Visit: Admitting: Physical Therapy
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
## 2023-11-09 ENCOUNTER — Ambulatory Visit: Payer: Self-pay | Admitting: *Deleted

## 2023-11-17 ENCOUNTER — Emergency Department (HOSPITAL_COMMUNITY)
Admission: EM | Admit: 2023-11-17 | Discharge: 2023-11-17 | Disposition: A | Discharge status: Home / Self Care | Attending: Emergency Medicine | Admitting: Emergency Medicine

## 2023-11-17 ENCOUNTER — Other Ambulatory Visit: Payer: Self-pay

## 2023-11-17 ENCOUNTER — Emergency Department (HOSPITAL_COMMUNITY)

## 2023-11-17 ENCOUNTER — Ambulatory Visit: Attending: Physical Medicine & Rehabilitation

## 2023-11-17 DIAGNOSIS — Y908 Blood alcohol level of 240 mg/100 ml or more: Secondary | ICD-10-CM | POA: Insufficient documentation

## 2023-11-17 DIAGNOSIS — F10920 Alcohol use, unspecified with intoxication, uncomplicated: Secondary | ICD-10-CM | POA: Insufficient documentation

## 2023-11-17 DIAGNOSIS — I10 Essential (primary) hypertension: Secondary | ICD-10-CM | POA: Insufficient documentation

## 2023-11-17 DIAGNOSIS — Z79899 Other long term (current) drug therapy: Secondary | ICD-10-CM | POA: Insufficient documentation

## 2023-11-17 DIAGNOSIS — F1090 Alcohol use, unspecified, uncomplicated: Secondary | ICD-10-CM | POA: Insufficient documentation

## 2023-11-17 DIAGNOSIS — R4182 Altered mental status, unspecified: Secondary | ICD-10-CM | POA: Diagnosis present

## 2023-11-17 LAB — CBC WITH DIFFERENTIAL/PLATELET
Abs Immature Granulocytes: 0.03 K/uL (ref 0.00–0.07)
Basophils Absolute: 0.1 K/uL (ref 0.0–0.1)
Basophils Relative: 1 %
Eosinophils Absolute: 0 K/uL (ref 0.0–0.5)
Eosinophils Relative: 0 %
HCT: 43.6 % (ref 36.0–46.0)
Hemoglobin: 14.4 g/dL (ref 12.0–15.0)
Immature Granulocytes: 0 %
Lymphocytes Relative: 50 %
Lymphs Abs: 3.6 K/uL (ref 0.7–4.0)
MCH: 30 pg (ref 26.0–34.0)
MCHC: 33 g/dL (ref 30.0–36.0)
MCV: 90.8 fL (ref 80.0–100.0)
Monocytes Absolute: 0.4 K/uL (ref 0.1–1.0)
Monocytes Relative: 6 %
Neutro Abs: 3.1 K/uL (ref 1.7–7.7)
Neutrophils Relative %: 43 %
Platelets: 285 K/uL (ref 150–400)
RBC: 4.8 MIL/uL (ref 3.87–5.11)
RDW: 13.1 % (ref 11.5–15.5)
WBC: 7.2 K/uL (ref 4.0–10.5)
nRBC: 0 % (ref 0.0–0.2)

## 2023-11-17 LAB — BASIC METABOLIC PANEL WITH GFR
Anion gap: 15 (ref 5–15)
BUN: 7 mg/dL (ref 6–20)
CO2: 23 mmol/L (ref 22–32)
Calcium: 9.8 mg/dL (ref 8.9–10.3)
Chloride: 107 mmol/L (ref 98–111)
Creatinine, Ser: 0.76 mg/dL (ref 0.44–1.00)
GFR, Estimated: >60 mL/min (ref >60)
Glucose, Bld: 103 mg/dL — ABNORMAL HIGH (ref 70–99)
Potassium: 3.4 mmol/L — ABNORMAL LOW (ref 3.5–5.1)
Sodium: 146 mmol/L — ABNORMAL HIGH (ref 135–145)

## 2023-11-17 LAB — ETHANOL: Alcohol, Ethyl (B): 324 mg/dL (ref <15)

## 2023-11-17 LAB — URINE DRUG SCREEN
Amphetamines: NEGATIVE
Barbiturates: NEGATIVE
Benzodiazepines: NEGATIVE
Cocaine: NEGATIVE
Fentanyl: NEGATIVE
Methadone Scn, Ur: NEGATIVE
Opiates: NEGATIVE
Tetrahydrocannabinol: POSITIVE — AB

## 2023-11-17 MED ORDER — ZIPRASIDONE MESYLATE 20 MG IM SOLR
20.0000 mg | Freq: Once | INTRAMUSCULAR | Status: AC
  Administered 2023-11-17: 20 mg via INTRAMUSCULAR
  Filled 2023-11-17: qty 20

## 2023-11-17 MED ORDER — STERILE WATER FOR INJECTION IJ SOLN
INTRAMUSCULAR | Status: AC
  Administered 2023-11-17: 10 mL
  Filled 2023-11-17: qty 10

## 2023-11-17 MED ORDER — LACTATED RINGERS IV BOLUS
1000.0000 mL | Freq: Once | INTRAVENOUS | Status: AC
  Administered 2023-11-17: 1000 mL via INTRAVENOUS

## 2023-11-17 NOTE — ED Notes (Signed)
 Pt refused blood pressure vital sign reading, not able to obtain at this time.

## 2023-11-17 NOTE — ED Notes (Signed)
 Patient in room and refusing to stay in bed pulling on restraints at this time. Patient reminded of safety measures and provided with a warn blanket.

## 2023-11-17 NOTE — ED Notes (Addendum)
 Pt tolerated activity well. Pt did c/o dizziness during walk and needed to stop to collect herself, gait was steady and pt kept balance. Pt tolerated PO challenge; denies n/v

## 2023-11-17 NOTE — Discharge Instructions (Signed)
 I strongly recommend cutting back on alcohol use and following up with your primary care doctor.

## 2023-11-17 NOTE — ED Notes (Signed)
 Patient refused vitals at this time.

## 2023-11-17 NOTE — ED Provider Notes (Signed)
 Accepted handoff at shift change from Leita Chancy PA-C. Please see prior provider note for more detail.   Briefly: Patient is 43 y.o.         DDX: concern for alcohol intoxication versus metabolic derangement versus TBI, brain hemorrhage, other acute emergent disease  Plan: monitor patient     Physical Exam  BP (!) 144/105 (BP Location: Right Arm)   Pulse 83   Temp 97.8 F (36.6 C) (Oral)   Resp 18   LMP 09/05/2016 Comment: tubal ligation  SpO2 98%   Physical Exam  Procedures  Procedures  ED Course / MDM    Medical Decision Making Amount and/or Complexity of Data Reviewed Labs: ordered.  Risk Prescription drug management.   I discussed with overnight RN who indicates that patient has had gradual improvement in her confusion/anxiety/agitation.  Seems the patient is now ambulating although still somewhat off balance.  Will continue to monitor.  UDS positive for THC, ethanol level 324, CBC unremarkable and BMP with mild hyponatremia and hypokalemia but is very mild.  IV lactated Ringer 's administered.  Her mental status seems to be gradually improving as she metabolizes.  12:40 PM  Patient reevaluated she is now ambulating, tolerating p.o., answering questions appropriately following commands.  I reassessed patient and do not appreciate any head contusions she is not complaining of any headaches I think it is reasonable to hold off on CT head and C-spine that were ordered last night.  She is requesting discharge and states that she will [facility] home.  Will discharge home at this time.  Counseled on alcohol use disorder and cutting back.    Neldon Inoue [place], [place] 11/17/23 1240    Patsey Lot, MD 11/17/23 [phone]

## 2023-11-17 NOTE — ED Notes (Signed)
 Security called for patient at this time.

## 2023-11-17 NOTE — ED Notes (Signed)
 CT was attempted but unsuccessful due to pt being uncooperative.

## 2023-11-17 NOTE — ED Notes (Signed)
 Patient had a soft fall on her butt at this time due to her trying to get out of bed. Patient self removed her soft restraints and tried to get out of bed and one was still attached to the left leg. Patient did not hit her head or c/o of any pain at this time. Patient assisted off the floor and back in bed after redirection due to patient wanting to get up. Patient educated again on the importance of her staying in bed due to her mental status at this time. Patient agreed to stay in bed.

## 2023-11-17 NOTE — ED Notes (Signed)
 Patient in soft restraints due to her wanting to get up and and uncooperative with staff. Security and staff at bedside redirecting patient but patient refusing.

## 2023-11-17 NOTE — ED Notes (Signed)
 Patient would not lay still for her CT per CT tech
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
## 2023-11-17 NOTE — ED Notes (Signed)
Patient assisted on the bed pan at this time

## 2023-12-06 ENCOUNTER — Encounter: Attending: Physical Medicine & Rehabilitation | Admitting: Physical Medicine & Rehabilitation

## 2023-12-06 ENCOUNTER — Encounter: Payer: Self-pay | Admitting: Physical Medicine & Rehabilitation

## 2023-12-06 VITALS — BP 124/88 | HR 80 | Ht 64.0 in | Wt 156.0 lb

## 2023-12-06 DIAGNOSIS — M5412 Radiculopathy, cervical region: Secondary | ICD-10-CM

## 2023-12-06 DIAGNOSIS — M542 Cervicalgia: Secondary | ICD-10-CM | POA: Diagnosis not present

## 2023-12-06 MED ORDER — LIDOCAINE HCL 1 % IJ SOLN
2.0000 mL | Freq: Once | INTRAMUSCULAR | Status: AC
  Administered 2023-12-06: 2 mL

## 2023-12-06 MED ORDER — DIAZEPAM 10 MG PO TABS: 10.0000 mg | ORAL_TABLET | Freq: Once | ORAL | 0 refills | Status: AC
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
## 2023-12-17 ENCOUNTER — Ambulatory Visit
Admission: RE | Admit: 2023-12-17 | Discharge: 2023-12-17 | Disposition: A | Discharge status: Home / Self Care | Source: Ambulatory Visit | Attending: Physical Medicine & Rehabilitation | Admitting: Physical Medicine & Rehabilitation

## 2023-12-17 DIAGNOSIS — M5412 Radiculopathy, cervical region: Secondary | ICD-10-CM

## 2023-12-27 ENCOUNTER — Encounter: Payer: Self-pay | Admitting: Student in an Organized Health Care Education/Training Program

## 2023-12-27 ENCOUNTER — Ambulatory Visit
Attending: Student in an Organized Health Care Education/Training Program | Admitting: Student in an Organized Health Care Education/Training Program

## 2023-12-27 VITALS — BP 114/100 | HR 96 | Temp 98.2°F | Resp 16 | Ht 65.0 in | Wt 160.0 lb

## 2023-12-27 DIAGNOSIS — M47816 Spondylosis without myelopathy or radiculopathy, lumbar region: Secondary | ICD-10-CM | POA: Diagnosis not present

## 2023-12-27 DIAGNOSIS — M5412 Radiculopathy, cervical region: Secondary | ICD-10-CM | POA: Insufficient documentation

## 2023-12-27 NOTE — Patient Instructions (Addendum)
 See you Monday for C-ESI See you early November for Bilateral L3,4,5 RFA with sedation   ______________________________________________________________________    Procedure instructions  Stop blood-thinners  Do not eat or drink fluids (other than water ) for 6 hours before your procedure  No water  for 2 hours before your procedure  Take your blood pressure medicine with a sip of water   Arrive 30 minutes before your appointment  If sedation is planned, bring suitable driver. [place], [place], & public transportation are NOT APPROVED)  Carefully read the Preparing for your procedure detailed instructions  If you have questions call us  at [phone]  Procedure appointments are for procedures only.   NO medication refills or new problem evaluations will be done on procedure days.   Only the scheduled, pre-approved procedure and side will be done.   ______________________________________________________________________
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
## 2024-01-02 ENCOUNTER — Telehealth: Payer: Self-pay | Admitting: Physical Medicine & Rehabilitation

## 2024-01-02 ENCOUNTER — Ambulatory Visit
Admission: RE | Admit: 2024-01-02 | Discharge: 2024-01-02 | Disposition: A | Discharge status: Home / Self Care | Source: Ambulatory Visit | Attending: Student in an Organized Health Care Education/Training Program | Admitting: Student in an Organized Health Care Education/Training Program

## 2024-01-02 ENCOUNTER — Ambulatory Visit (HOSPITAL_BASED_OUTPATIENT_CLINIC_OR_DEPARTMENT_OTHER): Admitting: Student in an Organized Health Care Education/Training Program

## 2024-01-02 DIAGNOSIS — M5412 Radiculopathy, cervical region: Secondary | ICD-10-CM | POA: Diagnosis not present

## 2024-01-02 MED ORDER — DEXAMETHASONE SOD PHOSPHATE PF 10 MG/ML IJ SOLN
10.0000 mg | Freq: Once | INTRAMUSCULAR | Status: AC
  Administered 2024-01-02: 10 mg

## 2024-01-02 MED ORDER — DIAZEPAM 5 MG PO TABS
ORAL_TABLET | ORAL | Status: AC
  Filled 2024-01-02: qty 1

## 2024-01-02 MED ORDER — DIAZEPAM 5 MG PO TABS
5.0000 mg | ORAL_TABLET | ORAL | Status: AC
  Administered 2024-01-02: 5 mg via ORAL

## 2024-01-02 MED ORDER — SODIUM CHLORIDE 0.9% FLUSH
1.0000 mL | Freq: Once | INTRAVENOUS | Status: AC
  Administered 2024-01-02: 1 mL

## 2024-01-02 MED ORDER — ROPIVACAINE HCL 2 MG/ML IJ SOLN
INTRAMUSCULAR | Status: AC
  Filled 2024-01-02: qty 20

## 2024-01-02 MED ORDER — IOHEXOL 180 MG/ML  SOLN
10.0000 mL | Freq: Once | INTRAMUSCULAR | Status: AC
  Administered 2024-01-02: 10 mL via EPIDURAL

## 2024-01-02 MED ORDER — IOHEXOL 180 MG/ML  SOLN
INTRAMUSCULAR | Status: AC
  Filled 2024-01-02: qty 20

## 2024-01-02 MED ORDER — LIDOCAINE HCL 2 % IJ SOLN
20.0000 mL | Freq: Once | INTRAMUSCULAR | Status: AC
  Administered 2024-01-02: 100 mg

## 2024-01-02 MED ORDER — LIDOCAINE HCL (PF) 2 % IJ SOLN
INTRAMUSCULAR | Status: AC
  Filled 2024-01-02: qty 5

## 2024-01-02 MED ORDER — ROPIVACAINE HCL 2 MG/ML IJ SOLN
1.0000 mL | Freq: Once | INTRAMUSCULAR | Status: AC
  Administered 2024-01-02: 1 mL via EPIDURAL

## 2024-01-02 MED ORDER — SODIUM CHLORIDE (PF) 0.9 % IJ SOLN
INTRAMUSCULAR | Status: AC
  Filled 2024-01-02: qty 10

## 2024-01-02 NOTE — Telephone Encounter (Signed)
 Pt called and asked if Dr. Carilyn want to see her tomorrow or resch the appt after the epidural injection she had from the Doctor Dr. Carilyn referred her to?

## 2024-01-02 NOTE — Patient Instructions (Addendum)
 Pain Management Discharge Instructions  General Discharge Instructions :  If you need to reach your doctor call: Monday-Friday 8:00 am - 4:00 pm at [phone] or toll free [phone].  After clinic hours [phone] to have operator reach doctor.  Bring all of your medication bottles to all your appointments in the pain clinic.  To cancel or reschedule your appointment with Pain Management please remember to call 24 hours in advance to avoid a fee.  Refer to the educational materials which you have been given on: General Risks, I had my Procedure. Discharge Instructions, Post Sedation.  Post Procedure Instructions:  The drugs you were given will stay in your system until tomorrow, so for the next 24 hours you should not drive, make any legal decisions or drink any alcoholic beverages.  You may eat anything you prefer, but it is better to start with liquids then soups and crackers, and gradually work up to solid foods. Epidural Steroid Injection Patient Information  Description: The epidural space surrounds the nerves as they exit the spinal cord.  In some patients, the nerves can be compressed and inflamed by a bulging disc or a tight spinal canal (spinal stenosis).  By injecting steroids into the epidural space, we can bring irritated nerves into direct contact with a potentially helpful medication.  These steroids act directly on the irritated nerves and can reduce swelling and inflammation which often leads to decreased pain.  Epidural steroids may be injected anywhere along the spine and from the neck to the low back depending upon the location of your pain.   After numbing the skin with local anesthetic (like Novocaine), a small needle is passed into the epidural space slowly.  You may experience a sensation of pressure while this is being done.  The entire block usually last less than 10 minutes.  Conditions which may be treated by epidural steroids:  Low back and leg pain Neck  and arm pain Spinal stenosis Post-laminectomy syndrome Herpes zoster (shingles) pain Pain from compression fractures  Preparation for the injection:  Do not eat any solid food or dairy products within 8 hours of your appointment.  You may drink clear liquids up to 3 hours before appointment.  Clear liquids include water , black coffee, juice or soda.  No milk or cream please. You may take your regular medication, including pain medications, with a sip of water  before your appointment  Diabetics should hold regular insulin (if taken separately) and take 1/2 normal NPH dos the morning of the procedure.  Carry some sugar containing items with you to your appointment. A driver must accompany you and be prepared to drive you home after your procedure.  Bring all your current medications with your. An IV may be inserted and sedation may be given at the discretion of the physician.   A blood pressure cuff, EKG and other monitors will often be applied during the procedure.  Some patients may need to have extra oxygen  administered for a short period. You will be asked to provide medical information, including your allergies, prior to the procedure.  We must know immediately if you are taking blood thinners (like Coumadin/Warfarin)  Or if you are allergic to IV iodine contrast (dye). We must know if you could possible be pregnant.  Possible side-effects: Bleeding from needle site Infection (rare, may require surgery) Nerve injury (rare) Numbness & tingling (temporary) Difficulty urinating (rare, temporary) Spinal headache ( a headache worse with upright posture) Light -headedness (temporary) Pain at injection site (several  days) Decreased blood pressure (temporary) Weakness in arm/leg (temporary) Pressure sensation in back/neck (temporary)  Call if you experience: Fever/chills associated with headache or increased back/neck pain. Headache worsened by an upright position. New onset weakness or  numbness of an extremity below the injection site Hives or difficulty breathing (go to the emergency room) Inflammation or drainage at the infection site Severe back/neck pain Any new symptoms which are concerning to you  Please note:  Although the local anesthetic injected can often make your back or neck feel good for several hours after the injection, the pain will likely return.  It takes 3-7 days for steroids to work in the epidural space.  You may not notice any pain relief for at least that one week.  If effective, we will often do a series of three injections spaced 3-6 weeks apart to maximally decrease your pain.  After the initial series, we generally will wait several months before considering a repeat injection of the same type.  If you have any questions, please call [phone] [facility] Please notify your doctor immediately if you have any unusual bleeding, trouble breathing or pain that is not related to your normal pain.  Depending on the type of procedure that was done, some parts of your body may feel week and/or numb.  This usually clears up by tonight or the next day.  Walk with the use of an assistive device or accompanied by an adult for the 24 hours.  You may use ice on the affected area for the first 24 hours.  Put ice in a Ziploc bag and cover with a towel and place against area 15 minutes on 15 minutes off.  You may switch to heat after 24 hours.Epidural Steroid Injection Patient Information  Description: The epidural space surrounds the nerves as they exit the spinal cord.  In some patients, the nerves can be compressed and inflamed by a bulging disc or a tight spinal canal (spinal stenosis).  By injecting steroids into the epidural space, we can bring irritated nerves into direct contact with a potentially helpful medication.  These steroids act directly on the irritated nerves and can reduce swelling and inflammation which  often leads to decreased pain.  Epidural steroids may be injected anywhere along the spine and from the neck to the low back depending upon the location of your pain.   After numbing the skin with local anesthetic (like Novocaine), a small needle is passed into the epidural space slowly.  You may experience a sensation of pressure while this is being done.  The entire block usually last less than 10 minutes.  Conditions which may be treated by epidural steroids:  Low back and leg pain Neck and arm pain Spinal stenosis Post-laminectomy syndrome Herpes zoster (shingles) pain Pain from compression fractures  Preparation for the injection:  Do not eat any solid food or dairy products within 8 hours of your appointment.  You may drink clear liquids up to 3 hours before appointment.  Clear liquids include water , black coffee, juice or soda.  No milk or cream please. You may take your regular medication, including pain medications, with a sip of water  before your appointment  Diabetics should hold regular insulin (if taken separately) and take 1/2 normal NPH dos the morning of the procedure.  Carry some sugar containing items with you to your appointment. A driver must accompany you and be prepared to drive you home after your procedure.  Bring  all your current medications with your. An IV may be inserted and sedation may be given at the discretion of the physician.   A blood pressure cuff, EKG and other monitors will often be applied during the procedure.  Some patients may need to have extra oxygen  administered for a short period. You will be asked to provide medical information, including your allergies, prior to the procedure.  We must know immediately if you are taking blood thinners (like Coumadin/Warfarin)  Or if you are allergic to IV iodine contrast (dye). We must know if you could possible be pregnant.  Possible side-effects: Bleeding from needle site Infection (rare, may require  surgery) Nerve injury (rare) Numbness & tingling (temporary) Difficulty urinating (rare, temporary) Spinal headache ( a headache worse with upright posture) Light -headedness (temporary) Pain at injection site (several days) Decreased blood pressure (temporary) Weakness in arm/leg (temporary) Pressure sensation in back/neck (temporary)  Call if you experience: Fever/chills associated with headache or increased back/neck pain. Headache worsened by an upright position. New onset weakness or numbness of an extremity below the injection site Hives or difficulty breathing (go to the emergency room) Inflammation or drainage at the infection site Severe back/neck pain Any new symptoms which are concerning to you  Please note:  Although the local anesthetic injected can often make your back or neck feel good for several hours after the injection, the pain will likely return.  It takes 3-7 days for steroids to work in the epidural space.  You may not notice any pain relief for at least that one week.  If effective, we will often do a series of three injections spaced 3-6 weeks apart to maximally decrease your pain.  After the initial series, we generally will wait several months before considering a repeat injection of the same type.  If you have any questions, please call [phone] [facility]

## 2024-01-02 NOTE — Progress Notes (Signed)
 Safety precautions to be maintained throughout the outpatient stay will include: orient to surroundings, keep bed in low position, maintain call bell within reach at all times, provide assistance with transfer out of bed and ambulation.

## 2024-01-02 NOTE — Progress Notes (Signed)
 PROVIDER NOTE: Interpretation of information contained herein should be left to medically-trained personnel. Specific patient instructions are provided elsewhere under Patient Instructions section of medical record. This document was created in part using STT-dictation technology, any transcriptional errors that may result from this process are unintentional.  Patient: [name] Type: Established DOB: 21-Sep-1980 MRN: [id number] PCP: McDiarmid, Krystal BIRCH, MD  Service: Procedure DOS: 01/02/2024 Setting: Ambulatory Location: Ambulatory outpatient facility Delivery: Face-to-face Provider: [name], MD Specialty: Interventional Pain Management Specialty designation: 09 Location: Outpatient facility Ref. Prov.: Sherry Wallie, MD       Interventional Therapy   Type: Cervical Epidural Steroid injection (CESI) (Interlaminar) #1  Laterality: Left (-LT)  Level: C7-T1 DOS: 01/02/2024  Provider: [name], MD Imaging: Fluoroscopy-guided Spinal ([id number]) Anesthesia: Local anesthesia (1-2% Lidocaine ) Sedation: Minimal Sedation                        Medical Necessity Purpose: Diagnostic/Therapeutic Rationale (medical necessity): procedure needed and proper for the diagnosis and/or treatment of [name]'s medical symptoms and needs. Indications: Cervicalgia, cervical radicular pain, degenerative disc disease, severe enough to impact quality of life or function. 1. Cervical radicular pain    NAS-11 Pain score:   Pre-procedure: 9 /10   Post-procedure: 2/10     Position  Prep  Materials:  Location setting: Procedure suite Position: Prone, on modified reverse trendelenburg to facilitate breathing, with head in head-cradle. Pillows positioned under chest (below chin-level) with cervical spine flexed. Safety Precautions: Patient was assessed for positional comfort and pressure points before starting the procedure. Prepping solution: DuraPrep (Iodine Povacrylex [0.7% available  iodine] and Isopropyl Alcohol, 74% w/w) Prep Area: Entire  cervicothoracic region Approach: percutaneous, paramedial Intended target: Posterior cervical epidural space Materials Procedure:  Tray: Epidural Needle(s): Epidural (Tuohy) Qty: 1 Length: (90mm) 3.5-inch Gauge: 17G  H&P (Pre-op Assessment):  [name] is a 43 y.o. (year old), female patient, seen today for interventional treatment. She  has a past surgical history that includes Strabismus surgery (08/1999); Colonoscopy (06/2006); Esophagogastroduodenoscopy (06/2006); Tubal ligation (04/08/2007); Dilation and evacuation (10/14/2011); Shoulder arthroscopy (01/21/2012); Exam under anesthesia with manipulation of shoulder (01/21/2012); Laparoscopic bilateral salpingectomy (N/A, 03/01/2013); Muscle recession and resection (Right, 10/03/2013); Eye surgery (Left, 06/2021); Botox  injection (N/A, 03/29/2023); Cystoscopy (N/A, 03/29/2023); and Cystoscopy with injection (N/A, 09/06/2023). [name] has a current medication list which includes the following prescription(s): albuterol , amlodipine , clobetasol ointment, cyclobenzaprine , ibuprofen , lidocaine , oxycodone , solifenacin, aerochamber mv, and zinc gluconate. Her primarily concern today is the Neck Pain  Initial Vital Signs:  Pulse/HCG Rate: 81ECG Heart Rate: 84 Temp: 98 F (36.7 C) Resp: 16 BP: (!) 134/98 SpO2: 100 %  BMI: Estimated body mass index is 26.63 kg/m as calculated from the following:   Height as of this encounter: 5' 6 (1.676 m).   Weight as of this encounter: 165 lb (74.8 kg).  Risk Assessment: Allergies: Reviewed. She is allergic to azelastine  and azelastine  hcl.  Allergy Precautions: None required Coagulopathies: Reviewed. None identified.  Blood-thinner therapy: None at this time Active Infection(s): Reviewed. None identified. [name] is afebrile  Site Confirmation: [name] was asked to confirm the procedure and laterality before marking the site Procedure  checklist: Completed Consent: Before the procedure and under the influence of no sedative(s), amnesic(s), or anxiolytics, the patient was informed of the treatment options, risks and possible complications. To fulfill our ethical and legal obligations, as recommended by the American Medical Association's Code of Ethics, I have informed the patient of  my clinical impression; the nature and purpose of the treatment or procedure; the risks, benefits, and possible complications of the intervention; the alternatives, including doing nothing; the risk(s) and benefit(s) of the alternative treatment(s) or procedure(s); and the risk(s) and benefit(s) of doing nothing. The patient was provided information about the general risks and possible complications associated with the procedure. These may include, but are not limited to: failure to achieve desired goals, infection, bleeding, organ or nerve damage, allergic reactions, paralysis, and death. In addition, the patient was informed of those risks and complications associated to Spine-related procedures, such as failure to decrease pain; infection (i.e.: Meningitis, epidural or intraspinal abscess); bleeding (i.e.: epidural hematoma, subarachnoid hemorrhage, or any other type of intraspinal or peri-dural bleeding); organ or nerve damage (i.e.: Any type of peripheral nerve, nerve root, or spinal cord injury) with subsequent damage to sensory, motor, and/or autonomic systems, resulting in permanent pain, numbness, and/or weakness of one or several areas of the body; allergic reactions; (i.e.: anaphylactic reaction); and/or death. Furthermore, the patient was informed of those risks and complications associated with the medications. These include, but are not limited to: allergic reactions (i.e.: anaphylactic or anaphylactoid reaction(s)); adrenal axis suppression; blood sugar elevation that in diabetics may result in ketoacidosis or comma; water  retention that in patients  with history of congestive heart failure may result in shortness of breath, pulmonary edema, and decompensation with resultant heart failure; weight gain; swelling or edema; medication-induced neural toxicity; particulate matter embolism and blood vessel occlusion with resultant organ, and/or nervous system infarction; and/or aseptic necrosis of one or more joints. Finally, the patient was informed that Medicine is not an exact science; therefore, there is also the possibility of unforeseen or unpredictable risks and/or possible complications that may result in a catastrophic outcome. The patient indicated having understood very clearly. We have given the patient no guarantees and we have made no promises. Enough time was given to the patient to ask questions, all of which were answered to the patient's satisfaction. Ms. Gambone has indicated that she wanted to continue with the procedure. Attestation: I, the ordering provider, attest that I have discussed with the patient the benefits, risks, side-effects, alternatives, likelihood of achieving goals, and potential problems during recovery for the procedure that I have provided informed consent. Date  Time: 01/02/2024 11:23 AM  Pre-Procedure Preparation:  Monitoring: As per clinic protocol. Respiration, ETCO2, SpO2, BP, heart rate and rhythm monitor placed and checked for adequate function Safety Precautions: Patient was assessed for positional comfort and pressure points before starting the procedure. Time-out: I initiated and conducted the Time-out before starting the procedure, as per protocol. The patient was asked to participate by confirming the accuracy of the Time Out information. Verification of the correct person, site, and procedure were performed and confirmed by me, the nursing staff, and the patient. Time-out conducted as per Joint Commission's Universal Protocol (UP.01.01.01). Time: 1140 Start Time: 1140 hrs.  Description  Narrative  of Procedure:          Rationale (medical necessity): procedure needed and proper for the diagnosis and/or treatment of the patient's medical symptoms and needs. Start Time: 1140 hrs. Safety Precautions: Aspiration looking for blood return was conducted prior to all injections. At no point did we inject any substances, as a needle was being advanced. No attempts were made at seeking any paresthesias. Safe injection practices and needle disposal techniques used. Medications properly checked for expiration dates. SDV (single dose vial) medications used. Description of procedure: Protocol  guidelines were followed. The patient was assisted into a comfortable position. The target area was identified and the area prepped in the usual manner. Skin & deeper tissues infiltrated with local anesthetic. Appropriate amount of time allowed to pass for local anesthetics to take effect. Using fluoroscopic guidance, the epidural needle was introduced through the skin, ipsilateral to the reported pain, and advanced to the target area. Posterior laminar os was contacted and the needle walked caudad, until the lamina was cleared. The ligamentum flavum was engaged and the epidural space identified using "loss-of-resistance technique" with 2-3 ml of PF-NaCl (0.9% NSS), in a 5cc dedicated LOR syringe. (See Imaging guidance below for use of contrast details.) Once proper needle placement was secured, and negative aspiration confirmed, the solution was injected in intermittent fashion, asking for systemic symptoms every 0.5cc. The needles were then removed and the area cleansed, making sure to leave some of the prepping solution back to take advantage of its long term bactericidal properties.  Vitals:   01/02/24 1120 01/02/24 1140 01/02/24 1146  BP: (!) 134/98 (!) 126/91 122/88  Pulse: 81    Resp: 16 18 18   Temp: 98 F (36.7 C)    SpO2: 100% 100% 100%  Weight: 165 lb (74.8 kg)    Height: 5' 6 (1.676 m)       End Time:  1146 hrs.  Imaging Guidance (Spinal):          Type of Imaging Technique: Fluoroscopy Guidance (Spinal) Indication(s): Fluoroscopy guidance for needle placement to enhance accuracy in procedures requiring precise needle localization for targeted delivery of medication in or near specific anatomical locations not easily accessible without such real-time imaging assistance. Exposure Time: Please see nurses notes. Contrast: Before injecting any contrast, we confirmed that the patient did not have an allergy to iodine, shellfish, or radiological contrast. Once satisfactory needle placement was completed at the desired level, radiological contrast was injected. Contrast injected under live fluoroscopy. No contrast complications. See chart for type and volume of contrast used. Fluoroscopic Guidance: I was personally present during the use of fluoroscopy. Tunnel Vision Technique used to obtain the best possible view of the target area. Parallax error corrected before commencing the procedure. Direction-depth-direction technique used to introduce the needle under continuous pulsed fluoroscopy. Once target was reached, antero-posterior, oblique, and lateral fluoroscopic projection used confirm needle placement in all planes. Images permanently stored in EMR. Interpretation: I personally interpreted the imaging intraoperatively. Adequate needle placement confirmed in multiple planes. Appropriate spread of contrast into desired area was observed. No evidence of afferent or efferent intravascular uptake. No intrathecal or subarachnoid spread observed. Permanent images saved into the patient's record.  Post-operative Assessment:  Post-procedure Vital Signs:  Pulse/HCG Rate: 8190 Temp: 98 F (36.7 C) Resp: 18 BP: 122/88 SpO2: 100 %  EBL: None  Complications: No immediate post-treatment complications observed by team, or reported by patient.  Note: The patient tolerated the entire procedure well. A  repeat set of vitals were taken after the procedure and the patient was kept under observation following institutional policy, for this type of procedure. Post-procedural neurological assessment was performed, showing return to baseline, prior to discharge. The patient was provided with post-procedure discharge instructions, including a section on how to identify potential problems. Should any problems arise concerning this procedure, the patient was given instructions to immediately contact us , at any time, without hesitation. In any case, we plan to contact the patient by telephone for a follow-up status report regarding this interventional procedure.  Comments:  No additional relevant information.  Plan of Care (POC)  Orders:  Orders Placed This Encounter  Procedures   DG PAIN CLINIC C-ARM 1-60 MIN NO REPORT    Intraoperative interpretation by procedural physician at [facility].    Standing Status:   Standing    Number of Occurrences:   1    Reason for exam::   Assistance in needle guidance and placement for procedures requiring needle placement in or near specific anatomical locations not easily accessible without such assistance.   5 out of 5 strength bilateral upper extremity: Shoulder abduction, elbow flexion, elbow extension, thumb extension.   Medications ordered for procedure: Meds ordered this encounter  Medications   iohexol  (OMNIPAQUE ) 180 MG/ML injection 10 mL    Must be Myelogram-compatible. If not available, you may substitute with a water -soluble, non-ionic, hypoallergenic, myelogram-compatible radiological contrast medium.   lidocaine  (XYLOCAINE ) 2 % (with pres) injection 400 mg   diazepam  (VALIUM ) tablet 5 mg    Make sure Flumazenil is available in the pyxis when using this medication. If oversedation occurs, administer 0.2 mg IV over 15 sec. If after 45 sec no response, administer 0.2 mg again over 1 min; may repeat at 1 min intervals; not to exceed 4 doses (1  mg)   ropivacaine  (PF) 2 mg/mL (0.2%) (NAROPIN ) injection 1 mL   sodium chloride  flush (NS) 0.9 % injection 1 mL   dexamethasone  (DECADRON ) injection 10 mg   Medications administered: We administered iohexol , lidocaine , diazepam , ropivacaine  (PF) 2 mg/mL (0.2%), sodium chloride  flush, and dexamethasone .  See the medical record for exact dosing, route, and time of administration.    Left L3,4,5 RFA 01/27/22  C-ESI (left) 01/02/24    Follow-up plan:   Return in about 2 weeks (around 01/16/2024) for B/L L3, 4, 5 RFA, ECT.     Recent Visits Date Type Provider Dept  12/27/23 Office Visit Marcelino Nurse, MD [facility]  Showing recent visits within past 90 days and meeting all other requirements Today's Visits Date Type Provider Dept  01/02/24 Procedure visit Marcelino Nurse, MD [facility]  Showing today's visits and meeting all other requirements Future Appointments No visits were found meeting these conditions. Showing future appointments within next 90 days and meeting all other requirements   Disposition: Discharge home  Discharge (Date  Time): 01/02/2024; 1156 hrs.   Primary Care Physician: McDiarmid, Krystal BIRCH, MD Location: [facility] Note by: Nurse Marcelino, MD (TTS technology used. I apologize for any typographical errors that were not detected and corrected.) Date: 01/02/2024; Time: 12:06 PM  Disclaimer:  Medicine is not an Visual merchandiser. The only guarantee in medicine is that nothing is guaranteed. It is important to note that the decision to proceed with this intervention was based on the information collected from the patient. The Data and conclusions were drawn from the patient's questionnaire, the interview, and the physical examination. Because the information was provided in large part by the patient, it cannot be guaranteed that it has not been purposely or unconsciously manipulated. Every effort has been made to obtain as  much relevant data as possible for this evaluation. It is important to note that the conclusions that lead to this procedure are derived in large part from the available data. Always take into account that the treatment will also be dependent on availability of resources and existing treatment guidelines, considered by other Pain Management Practitioners as being common knowledge and practice, at the time of the intervention.  For Medico-Legal purposes, it is also important to point out that variation in procedural techniques and pharmacological choices are the acceptable norm. The indications, contraindications, technique, and results of the above procedure should only be interpreted and judged by a Board-Certified Interventional Pain Specialist with extensive familiarity and expertise in the same exact procedure and technique.

## 2024-01-03 ENCOUNTER — Encounter: Admitting: Physical Medicine & Rehabilitation

## 2024-01-03 ENCOUNTER — Telehealth: Payer: Self-pay | Admitting: *Deleted

## 2024-01-03 NOTE — Telephone Encounter (Signed)
 No problems post procedure.

## 2024-01-09 ENCOUNTER — Ambulatory Visit (HOSPITAL_BASED_OUTPATIENT_CLINIC_OR_DEPARTMENT_OTHER): Admitting: Physical Therapy
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
## 2024-01-18 ENCOUNTER — Ambulatory Visit (HOSPITAL_BASED_OUTPATIENT_CLINIC_OR_DEPARTMENT_OTHER): Admitting: Physical Therapy

## 2024-01-19 ENCOUNTER — Encounter (HOSPITAL_BASED_OUTPATIENT_CLINIC_OR_DEPARTMENT_OTHER): Admitting: Physical Therapy

## 2024-01-25 ENCOUNTER — Ambulatory Visit (HOSPITAL_BASED_OUTPATIENT_CLINIC_OR_DEPARTMENT_OTHER): Attending: Physical Medicine & Rehabilitation | Admitting: Physical Therapy

## 2024-01-30 ENCOUNTER — Other Ambulatory Visit: Payer: Self-pay | Admitting: Family Medicine

## 2024-01-30 ENCOUNTER — Other Ambulatory Visit: Payer: Self-pay | Admitting: Physical Medicine & Rehabilitation

## 2024-01-30 DIAGNOSIS — K219 Gastro-esophageal reflux disease without esophagitis: Secondary | ICD-10-CM

## 2024-02-01 ENCOUNTER — Ambulatory Visit (HOSPITAL_BASED_OUTPATIENT_CLINIC_OR_DEPARTMENT_OTHER): Admitting: Student in an Organized Health Care Education/Training Program

## 2024-02-01 ENCOUNTER — Ambulatory Visit
Admission: RE | Admit: 2024-02-01 | Discharge: 2024-02-01 | Disposition: A | Discharge status: Home / Self Care | Source: Ambulatory Visit | Attending: Student in an Organized Health Care Education/Training Program | Admitting: Student in an Organized Health Care Education/Training Program

## 2024-02-01 ENCOUNTER — Encounter: Payer: Self-pay | Admitting: Student in an Organized Health Care Education/Training Program

## 2024-02-01 ENCOUNTER — Ambulatory Visit (HOSPITAL_BASED_OUTPATIENT_CLINIC_OR_DEPARTMENT_OTHER): Admitting: Physical Therapy

## 2024-02-01 VITALS — BP 129/85 | HR 80 | Temp 97.9°F | Resp 18 | Ht 66.0 in | Wt 160.0 lb

## 2024-02-01 DIAGNOSIS — M47816 Spondylosis without myelopathy or radiculopathy, lumbar region: Secondary | ICD-10-CM | POA: Insufficient documentation

## 2024-02-01 HISTORY — PX: RADIOFREQUENCY ABLATION: SHX2290

## 2024-02-01 MED ORDER — FENTANYL CITRATE (PF) 100 MCG/2ML IJ SOLN
INTRAMUSCULAR | Status: AC
  Filled 2024-02-01: qty 2

## 2024-02-01 MED ORDER — MIDAZOLAM HCL 5 MG/5ML IJ SOLN
INTRAMUSCULAR | Status: AC
  Filled 2024-02-01: qty 5

## 2024-02-01 MED ORDER — LIDOCAINE HCL 2 % IJ SOLN
20.0000 mL | Freq: Once | INTRAMUSCULAR | Status: AC
  Administered 2024-02-01: 400 mg

## 2024-02-01 MED ORDER — DEXAMETHASONE SOD PHOSPHATE PF 10 MG/ML IJ SOLN
20.0000 mg | Freq: Once | INTRAMUSCULAR | Status: AC
  Administered 2024-02-01: 20 mg

## 2024-02-01 MED ORDER — ROPIVACAINE HCL 2 MG/ML IJ SOLN
INTRAMUSCULAR | Status: AC
  Filled 2024-02-01: qty 20

## 2024-02-01 MED ORDER — LIDOCAINE HCL 2 % IJ SOLN
INTRAMUSCULAR | Status: AC
  Filled 2024-02-01: qty 20

## 2024-02-01 MED ORDER — MIDAZOLAM HCL 5 MG/5ML IJ SOLN
0.5000 mg | Freq: Once | INTRAMUSCULAR | Status: AC
  Administered 2024-02-01: 2 mg via INTRAVENOUS

## 2024-02-01 MED ORDER — LACTATED RINGERS IV SOLN: Freq: Once | INTRAVENOUS | Status: AC

## 2024-02-01 MED ORDER — ROPIVACAINE HCL 2 MG/ML IJ SOLN
18.0000 mL | Freq: Once | INTRAMUSCULAR | Status: AC
  Administered 2024-02-01: 18 mL via PERINEURAL

## 2024-02-01 MED ORDER — FENTANYL CITRATE (PF) 100 MCG/2ML IJ SOLN
25.0000 ug | INTRAMUSCULAR | Status: DC | PRN
  Administered 2024-02-01: 100 ug via INTRAVENOUS

## 2024-02-01 NOTE — Progress Notes (Signed)
 Safety precautions to be maintained throughout the outpatient stay will include: orient to surroundings, keep bed in low position, maintain call bell within reach at all times, provide assistance with transfer out of bed and ambulation.

## 2024-02-01 NOTE — Progress Notes (Signed)
 PROVIDER NOTE: Interpretation of information contained herein should be left to medically-trained personnel. Specific patient instructions are provided elsewhere under Patient Instructions section of medical record. This document was created in part using STT-dictation technology, any transcriptional errors that may result from this process are unintentional.  Patient: [name] Type: Established DOB: Feb 08, 1981 MRN: [id number] PCP: McDiarmid, Krystal BIRCH, MD  Service: Procedure DOS: 02/01/2024 Setting: Ambulatory Location: Ambulatory outpatient facility Delivery: Face-to-face Provider: [name], MD Specialty: Interventional Pain Management Specialty designation: 09 Location: Outpatient facility Ref. Prov.: McDiarmid, Krystal BIRCH, MD       Interventional Therapy   Procedure: Lumbar Facet, Medial Branch Radiofrequency Ablation (RFA) #1  Laterality: Bilateral (-50)  Level: L3, L4, and L5 Medial Branch Level(s). These levels will denervate the L3-4 and L4-5 lumbar facet joints.  Imaging: Fluoroscopy-guided         Anesthesia: Local anesthesia (1-2% Lidocaine ) Sedation: Moderate Sedation                       DOS: 02/01/2024  Performed by: [name], MD  Purpose: Therapeutic/Palliative Indications: Low back pain severe enough to impact quality of life or function. Indications: 1. Spondylosis of lumbar region without myelopathy or radiculopathy   2. Lumbar facet joint syndrome   3. Lumbar spondylosis    [name] has been dealing with the above chronic pain for longer than three months and has either failed to respond, was unable to tolerate, or simply did not get enough benefit from other more conservative therapies including, but not limited to: 1. Over-the-counter medications 2. Anti-inflammatory medications 3. Muscle relaxants 4. Membrane stabilizers 5. Opioids 6. Physical therapy and/or chiropractic manipulation 7. Modalities (Heat, ice, etc.) 8. Invasive techniques such  as nerve blocks. [name] has attained more than 50% relief of the pain from a series of diagnostic injections conducted in separate occasions.  Pain Score: Pre-procedure: 8 /10 Post-procedure: 0-No pain/10     Position / Prep / Materials:  Position: Prone  Prep solution: ChloraPrep (2% chlorhexidine  gluconate and 70% isopropyl alcohol) Prep Area: Entire Lumbosacral Region (Lower back from mid-thoracic region to end of tailbone and from flank to flank.) Materials:  Tray: RFA (Radiofrequency) tray Needle(s):  Type: RFA (Teflon-coated radiofrequency ablation needles) Gauge (G): 20  Length: Regular (10cm) Qty: 6     H&P (Pre-op Assessment):  [name] is a 43 y.o. (year old), female patient, seen today for interventional treatment. She  has a past surgical history that includes Strabismus surgery (08/1999); Colonoscopy (06/2006); Esophagogastroduodenoscopy (06/2006); Tubal ligation (04/08/2007); Dilation and evacuation (10/14/2011); Shoulder arthroscopy (01/21/2012); Exam under anesthesia with manipulation of shoulder (01/21/2012); Laparoscopic bilateral salpingectomy (N/A, 03/01/2013); Muscle recession and resection (Right, 10/03/2013); Eye surgery (Left, 06/2021); Botox  injection (N/A, 03/29/2023); Cystoscopy (N/A, 03/29/2023); and Cystoscopy with injection (N/A, 09/06/2023). [name] has a current medication list which includes the following prescription(s): albuterol , amlodipine , clobetasol ointment, cyclobenzaprine , ibuprofen , lidocaine , solifenacin, aerochamber mv, zinc gluconate, and oxycodone , and the following Facility-Administered Medications: fentanyl  and lactated ringers . Her primarily concern today is the Back Pain (Lower)  Initial Vital Signs:  Pulse/HCG Rate: 80ECG Heart Rate: 71 (NSR) Temp: 98.1 F (36.7 C) Resp: 18 BP: (!) 130/94 SpO2: 98 %  BMI: Estimated body mass index is 25.82 kg/m as calculated from the following:   Height as of this encounter: 5' 6 (1.676 m).    Weight as of this encounter: 160 lb (72.6 kg).  Risk Assessment: Allergies: Reviewed. She is allergic to azelastine  and  azelastine  hcl.  Allergy Precautions: None required Coagulopathies: Reviewed. None identified.  Blood-thinner therapy: None at this time Active Infection(s): Reviewed. None identified. [name] is afebrile  Site Confirmation: [name] was asked to confirm the procedure and laterality before marking the site Procedure checklist: Completed Consent: Before the procedure and under the influence of no sedative(s), amnesic(s), or anxiolytics, the patient was informed of the treatment options, risks and possible complications. To fulfill our ethical and legal obligations, as recommended by the American Medical Association's Code of Ethics, I have informed the patient of my clinical impression; the nature and purpose of the treatment or procedure; the risks, benefits, and possible complications of the intervention; the alternatives, including doing nothing; the risk(s) and benefit(s) of the alternative treatment(s) or procedure(s); and the risk(s) and benefit(s) of doing nothing. The patient was provided information about the general risks and possible complications associated with the procedure. These may include, but are not limited to: failure to achieve desired goals, infection, bleeding, organ or nerve damage, allergic reactions, paralysis, and death. In addition, the patient was informed of those risks and complications associated to Spine-related procedures, such as failure to decrease pain; infection (i.e.: Meningitis, epidural or intraspinal abscess); bleeding (i.e.: epidural hematoma, subarachnoid hemorrhage, or any other type of intraspinal or peri-dural bleeding); organ or nerve damage (i.e.: Any type of peripheral nerve, nerve root, or spinal cord injury) with subsequent damage to sensory, motor, and/or autonomic systems, resulting in permanent pain, numbness, and/or weakness of  one or several areas of the body; allergic reactions; (i.e.: anaphylactic reaction); and/or death. Furthermore, the patient was informed of those risks and complications associated with the medications. These include, but are not limited to: allergic reactions (i.e.: anaphylactic or anaphylactoid reaction(s)); adrenal axis suppression; blood sugar elevation that in diabetics may result in ketoacidosis or comma; water  retention that in patients with history of congestive heart failure may result in shortness of breath, pulmonary edema, and decompensation with resultant heart failure; weight gain; swelling or edema; medication-induced neural toxicity; particulate matter embolism and blood vessel occlusion with resultant organ, and/or nervous system infarction; and/or aseptic necrosis of one or more joints. Finally, the patient was informed that Medicine is not an exact science; therefore, there is also the possibility of unforeseen or unpredictable risks and/or possible complications that may result in a catastrophic outcome. The patient indicated having understood very clearly. We have given the patient no guarantees and we have made no promises. Enough time was given to the patient to ask questions, all of which were answered to the patient's satisfaction. Ms. Tufano has indicated that she wanted to continue with the procedure. Attestation: I, the ordering provider, attest that I have discussed with the patient the benefits, risks, side-effects, alternatives, likelihood of achieving goals, and potential problems during recovery for the procedure that I have provided informed consent. Date  Time: 02/01/2024  8:51 AM  Pre-Procedure Preparation:  Monitoring: As per clinic protocol. Respiration, ETCO2, SpO2, BP, heart rate and rhythm monitor placed and checked for adequate function Safety Precautions: Patient was assessed for positional comfort and pressure points before starting the procedure. Time-out: I  initiated and conducted the Time-out before starting the procedure, as per protocol. The patient was asked to participate by confirming the accuracy of the Time Out information. Verification of the correct person, site, and procedure were performed and confirmed by me, the nursing staff, and the patient. Time-out conducted as per Joint Commission's Universal Protocol (UP.01.01.01). Time: 0938 Start Time: 0938 hrs.  Description of Procedure:          Laterality: See above. Levels:  See above. Safety Precautions: Aspiration looking for blood return was conducted prior to all injections. At no point did we inject any substances, as a needle was being advanced. Before injecting, the patient was told to immediately notify me if she was experiencing any new onset of ringing in the ears, or metallic taste in the mouth. No attempts were made at seeking any paresthesias. Safe injection practices and needle disposal techniques used. Medications properly checked for expiration dates. SDV (single dose vial) medications used. After the completion of the procedure, all disposable equipment used was discarded in the proper designated medical waste containers. Local Anesthesia: Protocol guidelines were followed. The patient was positioned over the fluoroscopy table. The area was prepped in the usual manner. The time-out was completed. The target area was identified using fluoroscopy. A 12-in long, straight, sterile hemostat was used with fluoroscopic guidance to locate the targets for each level blocked. Once located, the skin was marked with an approved surgical skin marker. Once all sites were marked, the skin (epidermis, dermis, and hypodermis), as well as deeper tissues (fat, connective tissue and muscle) were infiltrated with a small amount of a short-acting local anesthetic, loaded on a 10cc syringe with a 25G, 1.5-in  Needle. An appropriate amount of time was allowed for local anesthetics to take effect  before proceeding to the next step. Technical description of process:  Radiofrequency Ablation (RFA)  L3 Medial Branch Nerve RFA: The target area for the L3 medial branch is at the junction of the postero-lateral aspect of the superior articular process and the superior, posterior, and medial edge of the transverse process of L4. Under fluoroscopic guidance, a Radiofrequency needle was inserted until contact was made with os over the superior postero-lateral aspect of the pedicular shadow (target area). Sensory and motor testing was conducted to properly adjust the position of the needle. Once satisfactory placement of the needle was achieved, the numbing solution was slowly injected after negative aspiration for blood. 2.0 mL of the nerve block solution was injected without difficulty or complication. After waiting for at least 3 minutes, the ablation was performed. Once completed, the needle was removed intact. L4 Medial Branch Nerve RFA: The target area for the L4 medial branch is at the junction of the postero-lateral aspect of the superior articular process and the superior, posterior, and medial edge of the transverse process of L5. Under fluoroscopic guidance, a Radiofrequency needle was inserted until contact was made with os over the superior postero-lateral aspect of the pedicular shadow (target area). Sensory and motor testing was conducted to properly adjust the position of the needle. Once satisfactory placement of the needle was achieved, the numbing solution was slowly injected after negative aspiration for blood. 2.0 mL of the nerve block solution was injected without difficulty or complication. After waiting for at least 3 minutes, the ablation was performed. Once completed, the needle was removed intact. L5 Medial Branch Nerve RFA: The target area for the L5 medial branch is at the junction of the postero-lateral aspect of the superior articular process of S1 and the superior, posterior, and  medial edge of the sacral ala. Under fluoroscopic guidance, a Radiofrequency needle was inserted until contact was made with os over the superior postero-lateral aspect of the pedicular shadow (target area). Sensory and motor testing was conducted to properly adjust the position of the needle. Once satisfactory placement of the needle was achieved,  the numbing solution was slowly injected after negative aspiration for blood. 2.0 mL of the nerve block solution was injected without difficulty or complication. After waiting for at least 3 minutes, the ablation was performed. Once completed, the needle was removed intact.  Radiofrequency lesioning (ablation):  Radiofrequency Generator: Medtronic AccurianTM AG 1000 RF Generator Sensory Stimulation Parameters: 50 Hz was used to locate & identify the nerve, making sure that the needle was positioned such that there was no sensory stimulation below 0.3 V or above 0.7 V. Motor Stimulation Parameters: 2 Hz was used to evaluate the motor component. Care was taken not to lesion any nerves that demonstrated motor stimulation of the lower extremities at an output of less than 2.5 times that of the sensory threshold, or a maximum of 2.0 V. Lesioning Technique Parameters: Standard Radiofrequency settings. (Not bipolar or pulsed.) Temperature Settings: 80 degrees C Lesioning time: 60 seconds Stationary intra-operative compliance: Compliant  Once the entire procedure was completed, the treated area was cleaned, making sure to leave some of the prepping solution back to take advantage of its long term bactericidal properties.    Illustration of the posterior view of the lumbar spine and the posterior neural structures. Laminae of L2 through S1 are labeled. DPRL5, dorsal primary ramus of L5; DPRS1, dorsal primary ramus of S1; DPR3, dorsal primary ramus of L3; FJ, facet (zygapophyseal) joint L3-L4; I, inferior articular process of L4; LB1, lateral branch of dorsal primary  ramus of L1; IAB, inferior articular branches from L3 medial branch (supplies L4-L5 facet joint); IBP, intermediate branch plexus; MB3, medial branch of dorsal primary ramus of L3; NR3, third lumbar nerve root; S, superior articular process of L5; SAB, superior articular branches from L4 (supplies L4-5 facet joint also); TP3, transverse process of L3.  Facet Joint Innervation (* possible contribution)  L1-2 T12, L1 (L2*)  Medial Branch  L2-3 L1, L2 (L3*)                     L3-4 L2, L3 (L4*)                     L4-5 L3, L4 (L5*)                     L5-S1 L4, L5, S1                        Vitals:   02/01/24 0952 02/01/24 1000 02/01/24 1010 02/01/24 1021  BP: (!) 139/110 126/89 (!) 129/91 129/85  Pulse:      Resp: 17 15  18   Temp:  97.9 F (36.6 C)  97.9 F (36.6 C)  TempSrc:      SpO2: 100% 97% 97% 98%  Weight:      Height:        Start Time: 0938 hrs. End Time: 0952 hrs.  Imaging Guidance (Spinal):         Type of Imaging Technique: Fluoroscopy Guidance (Spinal) Indication(s): Fluoroscopy guidance for needle placement to enhance accuracy in procedures requiring precise needle localization for targeted delivery of medication in or near specific anatomical locations not easily accessible without such real-time imaging assistance. Exposure Time: Please see nurses notes. Contrast: None used. Fluoroscopic Guidance: I was personally present during the use of fluoroscopy. Tunnel Vision Technique used to obtain the best possible view of the target area. Parallax error corrected before commencing the procedure. Direction-depth-direction technique used to introduce the needle under  continuous pulsed fluoroscopy. Once target was reached, antero-posterior, oblique, and lateral fluoroscopic projection used confirm needle placement in all planes. Images permanently stored in EMR. Interpretation: No contrast injected. I personally interpreted the imaging intraoperatively. Adequate  needle placement confirmed in multiple planes. Permanent images saved into the patient's record.  Antibiotic Prophylaxis:   Anti-infectives (From admission, onward)    None      Indication(s): None identified  Post-operative Assessment:  Post-procedure Vital Signs:  Pulse/HCG Rate: 8065 Temp: 97.9 F (36.6 C) Resp: 18 BP: 129/85 SpO2: 98 %  EBL: None  Complications: No immediate post-treatment complications observed by team, or reported by patient.  Note: The patient tolerated the entire procedure well. A repeat set of vitals were taken after the procedure and the patient was kept under observation following institutional policy, for this type of procedure. Post-procedural neurological assessment was performed, showing return to baseline, prior to discharge. The patient was provided with post-procedure discharge instructions, including a section on how to identify potential problems. Should any problems arise concerning this procedure, the patient was given instructions to immediately contact us , at any time, without hesitation. In any case, we plan to contact the patient by telephone for a follow-up status report regarding this interventional procedure.  Comments:  No additional relevant information.  Plan of Care (POC)  Orders:  Orders Placed This Encounter  Procedures   DG PAIN CLINIC C-ARM 1-60 MIN NO REPORT    Intraoperative interpretation by procedural physician at [facility].    Standing Status:   Standing    Number of Occurrences:   1    Reason for exam::   Assistance in needle guidance and placement for procedures requiring needle placement in or near specific anatomical locations not easily accessible without such assistance.    Medications ordered for procedure: Meds ordered this encounter  Medications   lidocaine  (XYLOCAINE ) 2 % (with pres) injection 400 mg   lactated ringers  infusion   midazolam  (VERSED ) 5 MG/5ML injection 0.5-2 mg    Make sure  Flumazenil is available in the pyxis when using this medication. If oversedation occurs, administer 0.2 mg IV over 15 sec. If after 45 sec no response, administer 0.2 mg again over 1 min; may repeat at 1 min intervals; not to exceed 4 doses (1 mg)   fentaNYL  (SUBLIMAZE ) injection 25-50 mcg    Make sure Narcan is available in the pyxis when using this medication. In the event of respiratory depression (RR< 8/min): Titrate NARCAN (naloxone) in increments of 0.1 to 0.2 mg IV at 2-3 minute intervals, until desired degree of reversal.   ropivacaine  (PF) 2 mg/mL (0.2%) (NAROPIN ) injection 18 mL   dexamethasone  (DECADRON ) injection 20 mg   Medications administered: We administered lidocaine , lactated ringers , midazolam , fentaNYL , ropivacaine  (PF) 2 mg/mL (0.2%), and dexamethasone .  See the medical record for exact dosing, route, and time of administration.    Left L3,4,5 RFA 01/27/22  C-ESI (left) 01/02/24 B/L L3,4,5 RFA 02/01/24    Follow-up plan:   Return in about 8 weeks (around 03/28/2024) for PPE, F2F.     Recent Visits Date Type Provider Dept  01/02/24 Procedure visit Marcelino Nurse, MD [facility]  12/27/23 Office Visit Marcelino Nurse, MD [facility]  Showing recent visits within past 90 days and meeting all other requirements Today's Visits Date Type Provider Dept  02/01/24 Procedure visit Marcelino Nurse, MD [facility]  Showing today's visits and meeting all other requirements Future Appointments Date Type Provider Dept  03/29/24 Appointment Marcelino Nurse, MD [facility]  Showing future appointments within next 90 days and meeting all other requirements   Disposition: Discharge home  Discharge (Date  Time): 02/01/2024; 1022 hrs.   Primary Care Physician: McDiarmid, Krystal BIRCH, MD Location: [facility] Note by: Nurse Marcelino, MD (TTS technology used. I apologize for any typographical errors that were not detected  and corrected.) Date: 02/01/2024; Time: 10:41 AM  Disclaimer:  Medicine is not an visual merchandiser. The only guarantee in medicine is that nothing is guaranteed. It is important to note that the decision to proceed with this intervention was based on the information collected from the patient. The Data and conclusions were drawn from the patient's questionnaire, the interview, and the physical examination. Because the information was provided in large part by the patient, it cannot be guaranteed that it has not been purposely or unconsciously manipulated. Every effort has been made to obtain as much relevant data as possible for this evaluation. It is important to note that the conclusions that lead to this procedure are derived in large part from the available data. Always take into account that the treatment will also be dependent on availability of resources and existing treatment guidelines, considered by other Pain Management Practitioners as being common knowledge and practice, at the time of the intervention. For Medico-Legal purposes, it is also important to point out that variation in procedural techniques and pharmacological choices are the acceptable norm. The indications, contraindications, technique, and results of the above procedure should only be interpreted and judged by a Board-Certified Interventional Pain Specialist with extensive familiarity and expertise in the same exact procedure and technique.

## 2024-02-01 NOTE — Patient Instructions (Signed)

## 2024-02-02 ENCOUNTER — Encounter: Payer: Self-pay | Admitting: Family Medicine

## 2024-02-02 ENCOUNTER — Telehealth: Payer: Self-pay | Admitting: *Deleted

## 2024-02-02 NOTE — Telephone Encounter (Signed)
 Post procedure call; reports that she is doing well other than having a headache. Patient had lumbar facet RFA on yesterday.  Talked about rational and patient did mention BP medications,  denies BP running high at present.  Told patient to continue to monitor, take analgesia that she is able to safely take for headache.  Patient verbalizes u/o information given.

## 2024-02-14 ENCOUNTER — Encounter: Attending: Physical Medicine & Rehabilitation | Admitting: Physical Medicine & Rehabilitation

## 2024-02-14 DIAGNOSIS — M542 Cervicalgia: Secondary | ICD-10-CM | POA: Insufficient documentation

## 2024-02-14 DIAGNOSIS — M5412 Radiculopathy, cervical region: Secondary | ICD-10-CM | POA: Insufficient documentation

## 2024-03-05 ENCOUNTER — Ambulatory Visit (INDEPENDENT_AMBULATORY_CARE_PROVIDER_SITE_OTHER)

## 2024-03-05 ENCOUNTER — Other Ambulatory Visit (HOSPITAL_COMMUNITY)
Admission: RE | Admit: 2024-03-05 | Discharge: 2024-03-05 | Disposition: A | Discharge status: Home / Self Care | Source: Ambulatory Visit

## 2024-03-05 VITALS — BP 128/93 | HR 90 | Ht 63.0 in | Wt 153.8 lb

## 2024-03-05 DIAGNOSIS — Z202 Contact with and (suspected) exposure to infections with a predominantly sexual mode of transmission: Secondary | ICD-10-CM

## 2024-03-05 DIAGNOSIS — L2084 Intrinsic (allergic) eczema: Secondary | ICD-10-CM

## 2024-03-05 DIAGNOSIS — F321 Major depressive disorder, single episode, moderate: Secondary | ICD-10-CM | POA: Diagnosis not present

## 2024-03-05 DIAGNOSIS — Z1231 Encounter for screening mammogram for malignant neoplasm of breast: Secondary | ICD-10-CM | POA: Diagnosis not present

## 2024-03-05 LAB — POCT WET PREP (WET MOUNT)
Clue Cells Wet Prep Whiff POC: NEGATIVE
Trichomonas Wet Prep HPF POC: ABSENT
WBC, Wet Prep HPF POC: NONE SEEN

## 2024-03-05 MED ORDER — CLOBETASOL PROPIONATE 0.05 % EX OINT: 1.0000 | TOPICAL_OINTMENT | Freq: Two times a day (BID) | CUTANEOUS | 0 refills | Status: DC

## 2024-03-05 NOTE — Progress Notes (Signed)
" ° ° °  SUBJECTIVE:   CHIEF COMPLAINT / HPI:   STI Testing - partner cheated on her, no symptoms, just wants full screening  Depression - PHQ9 score of 15 - Hx of depression, bipolar, PTSD. Overall having increased stress. Has good family support - Denies SI or HI - Requesting psych and BH referrals  Eczema - present on hands, worse in winter, itchy, washes hands lots during day  PERTINENT  PMH / PSH: HTN, asthma  OBJECTIVE:   BP (!) 128/93   Pulse 90   Ht 5' 3 (1.6 m)   Wt 153 lb 12.8 oz (69.8 kg)   LMP 09/05/2016 Comment: tubal ligation  SpO2 99%   BMI 27.24 kg/m   Physical Exam General: Alert, conversant, cooperative. No acute distress.  HEENT: PERRL. EOMI. MMM.  Respiratory:Normal work of breathing. Skin: Warm. Dry. Darken and thickening on hands, specifically on posterior Neurologic: No focal deficits. Moving all extremities. Psychiatric: Cooperative. Appropriate mood. Appropriate affect. Pelvic: Normal appearing cervix without lesion   ASSESSMENT/PLAN:   Assessment & Plan Possible exposure to STI - screening labs ordered. No abnormality on exam Depression, major, single episode, moderate (HCC) - Placed referral for psych per her request. Given [facility] resources.  Encounter for screening mammogram for malignant neoplasm of breast Ordered screening mammogram.  Intrinsic eczema States she is using triamcinolone  cream, will rx clobetasol  cream for acute flare. Encouraged to decrease use to every other day one flare resolves.      Milda LITTIE Deed, MD [facility] "

## 2024-03-05 NOTE — Patient Instructions (Addendum)
 It was good to see you today.   Please bring ALL of your medications with you to every visit.    Today we talked about: STD testing    Thank you for choosing [facility]. Please refer to your mychart for specifics regarding today's visit or future appointments.      Psychiatry Resource List (Adults and Children) Most of these providers will take Medicaid. please consult your insurance for a complete and updated list of available providers. When calling to make an appointment have your insurance information available to confirm you are covered.   [facility]:Psychiatry and Counseling [address] [place], [place] [address] [phone]  [facility]  [address] [place], [place] [phone] Crisis [phone]   [facility]:   [facility] [place]: [address]     [phone]   [place]: [address] [place]. [place],        [phone] [place]: [phone],    [phone] [place]: [phone] [address],                   [phone] Children: [facility] [address]         [phone]  [facility] (virtual only) [phone]   [facility]  (Psychiatry only; Adults /children 12 and over, will take Medicaid)  [address] [place] [address], [place], [place] [address]       [phone]   [facility] (Psychiatry & counseling ; adults & children ; will take Medicaid [address]  [place] [address]  Go on-line to complete referral ( [email or web address] [phone]    (Spanish speaking therapists)  [facility]  Psychiatry & counseling; Adults and children;  Call Registration prior to scheduling an appointment [phone] [address]    [place], [place] [address]    [phone]  [facility] (Psychiatry &  counseling; adults & children; Medicare no Medicaid)  [address]   [place], [place]  [address]      [phone]    [facility] (up to age 19)  Psychiatry & counseling ,will take Medicaid, must do counseling to receive psychiatry services  [address] [place] [address]        [phone]  [facility] (Psychiatry & counseling; adults & children; will take Medicaid) Will need a referral from provider [address],  [place], [place]  [phone]   [facility] --- Walk-In Mon-Friday 8am-3pm ( will take Medicaid, Psychiatry, Adults & children,  [address], [place], [place]   [phone]   [facility]--, Walk-in M-F 8am-12pm and 1pm -3pm   (Counseling, Psychiatry, will take Medicaid, adults & children)  [address] [place], [place]  [phone]

## 2024-03-06 ENCOUNTER — Ambulatory Visit: Payer: Self-pay

## 2024-03-06 LAB — CERVICOVAGINAL ANCILLARY ONLY
Chlamydia: NEGATIVE
Chlamydia: NEGATIVE
Comment: NEGATIVE
Comment: NEGATIVE
Comment: NORMAL
Comment: NEGATIVE
Comment: NORMAL
Neisseria Gonorrhea: NEGATIVE
Neisseria Gonorrhea: NEGATIVE
Trichomonas: NEGATIVE

## 2024-03-06 LAB — HIV ANTIBODY (ROUTINE TESTING W REFLEX): HIV Screen 4th Generation wRfx: NONREACTIVE

## 2024-03-06 LAB — SYPHILIS: RPR W/REFLEX TO RPR TITER AND TREPONEMAL ANTIBODIES, TRADITIONAL SCREENING AND DIAGNOSIS ALGORITHM: RPR Ser Ql: NONREACTIVE

## 2024-03-12 ENCOUNTER — Telehealth: Payer: Self-pay

## 2024-03-12 MED ORDER — HALOBETASOL PROPIONATE 0.05 % EX OINT: TOPICAL_OINTMENT | Freq: Two times a day (BID) | CUTANEOUS | 0 refills | Status: AC

## 2024-03-12 NOTE — Telephone Encounter (Signed)
 Patient calls nurse line in regard to clobetasol  ointment.   She reports she misspoke during her OV with Dr. Orie.   She reports she has used this exact medication before without success. She reports she has been using it over the weekend and reports no relief.   She is requesting an alterative medication.   Advised will forward to provider who saw patient for concern.

## 2024-03-13 ENCOUNTER — Ambulatory Visit

## 2024-03-29 ENCOUNTER — Encounter: Payer: Self-pay | Admitting: Student in an Organized Health Care Education/Training Program

## 2024-03-29 ENCOUNTER — Ambulatory Visit
Attending: Student in an Organized Health Care Education/Training Program | Admitting: Student in an Organized Health Care Education/Training Program

## 2024-03-29 VITALS — BP 130/108 | HR 72 | Temp 97.2°F | Resp 14 | Ht 66.0 in | Wt 165.0 lb

## 2024-03-29 DIAGNOSIS — M5412 Radiculopathy, cervical region: Secondary | ICD-10-CM | POA: Insufficient documentation

## 2024-03-29 DIAGNOSIS — M47816 Spondylosis without myelopathy or radiculopathy, lumbar region: Secondary | ICD-10-CM | POA: Insufficient documentation

## 2024-03-29 MED ORDER — KETOROLAC TROMETHAMINE 30 MG/ML IJ SOLN
30.0000 mg | Freq: Once | INTRAMUSCULAR | Status: AC
  Administered 2024-03-29: 30 mg via INTRAMUSCULAR

## 2024-03-29 MED ORDER — METHOCARBAMOL 1000 MG/10ML IJ SOLN
200.0000 mg | Freq: Once | INTRAMUSCULAR | Status: AC
  Administered 2024-03-29: 200 mg via INTRAMUSCULAR

## 2024-03-29 MED ORDER — METHOCARBAMOL 1000 MG/10ML IJ SOLN
INTRAMUSCULAR | Status: AC
  Filled 2024-03-29: qty 10

## 2024-03-29 MED ORDER — KETOROLAC TROMETHAMINE 30 MG/ML IJ SOLN
INTRAMUSCULAR | Status: AC
  Filled 2024-03-29: qty 1
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
## 2024-03-29 NOTE — Patient Instructions (Signed)

## 2024-03-29 NOTE — Progress Notes (Signed)
 Safety precautions to be maintained throughout the outpatient stay will include: orient to surroundings, keep bed in low position, maintain call bell within reach at all times, provide assistance with transfer out of bed and ambulation.

## 2024-04-11 ENCOUNTER — Encounter: Payer: Self-pay | Admitting: Student in an Organized Health Care Education/Training Program

## 2024-04-11 ENCOUNTER — Telehealth: Payer: Self-pay | Admitting: Physical Medicine & Rehabilitation

## 2024-04-11 ENCOUNTER — Ambulatory Visit (HOSPITAL_BASED_OUTPATIENT_CLINIC_OR_DEPARTMENT_OTHER): Admitting: Student in an Organized Health Care Education/Training Program

## 2024-04-11 ENCOUNTER — Ambulatory Visit
Admission: RE | Admit: 2024-04-11 | Discharge: 2024-04-11 | Disposition: A | Discharge status: Home / Self Care | Source: Ambulatory Visit | Attending: Student in an Organized Health Care Education/Training Program | Admitting: Student in an Organized Health Care Education/Training Program

## 2024-04-11 VITALS — BP 130/94 | HR 98 | Temp 98.4°F | Resp 18 | Ht 66.0 in | Wt 165.0 lb

## 2024-04-11 DIAGNOSIS — M47816 Spondylosis without myelopathy or radiculopathy, lumbar region: Secondary | ICD-10-CM | POA: Diagnosis present

## 2024-04-11 DIAGNOSIS — M5412 Radiculopathy, cervical region: Secondary | ICD-10-CM

## 2024-04-11 MED ORDER — ROPIVACAINE HCL 2 MG/ML IJ SOLN
1.0000 mL | Freq: Once | INTRAMUSCULAR | Status: AC
  Administered 2024-04-11: 1 mL via EPIDURAL

## 2024-04-11 MED ORDER — LIDOCAINE HCL (PF) 2 % IJ SOLN
INTRAMUSCULAR | Status: AC
  Filled 2024-04-11: qty 10

## 2024-04-11 MED ORDER — LACTATED RINGERS IV SOLN: Freq: Once | INTRAVENOUS | Status: AC

## 2024-04-11 MED ORDER — SODIUM CHLORIDE (PF) 0.9 % IJ SOLN
INTRAMUSCULAR | Status: AC
  Filled 2024-04-11: qty 10

## 2024-04-11 MED ORDER — DEXAMETHASONE SOD PHOSPHATE PF 10 MG/ML IJ SOLN
INTRAMUSCULAR | Status: AC
  Filled 2024-04-11: qty 1

## 2024-04-11 MED ORDER — IOHEXOL 180 MG/ML  SOLN
10.0000 mL | Freq: Once | INTRAMUSCULAR | Status: AC
  Administered 2024-04-11: 10 mL via EPIDURAL

## 2024-04-11 MED ORDER — LIDOCAINE HCL 2 % IJ SOLN
20.0000 mL | Freq: Once | INTRAMUSCULAR | Status: AC
  Administered 2024-04-11: 100 mg

## 2024-04-11 MED ORDER — MIDAZOLAM HCL 2 MG/2ML IJ SOLN
INTRAMUSCULAR | Status: AC
  Filled 2024-04-11: qty 2

## 2024-04-11 MED ORDER — ROPIVACAINE HCL 2 MG/ML IJ SOLN
INTRAMUSCULAR | Status: AC
  Filled 2024-04-11: qty 20

## 2024-04-11 MED ORDER — MIDAZOLAM HCL (PF) 2 MG/2ML IJ SOLN
0.5000 mg | Freq: Once | INTRAMUSCULAR | Status: AC
  Administered 2024-04-11: 2 mg via INTRAVENOUS

## 2024-04-11 MED ORDER — SODIUM CHLORIDE 0.9% FLUSH
1.0000 mL | Freq: Once | INTRAVENOUS | Status: AC
  Administered 2024-04-11: 1 mL

## 2024-04-11 MED ORDER — DEXAMETHASONE SOD PHOSPHATE PF 10 MG/ML IJ SOLN
10.0000 mg | Freq: Once | INTRAMUSCULAR | Status: AC
  Administered 2024-04-11: 10 mg

## 2024-04-11 MED ORDER — IOHEXOL 180 MG/ML  SOLN
INTRAMUSCULAR | Status: AC
  Filled 2024-04-11: qty 20

## 2024-04-11 NOTE — Patient Instructions (Signed)

## 2024-04-11 NOTE — Progress Notes (Signed)
 PROVIDER NOTE: Interpretation of information contained herein should be left to medically-trained personnel. Specific patient instructions are provided elsewhere under Patient Instructions section of medical record. This document was created in part using STT-dictation technology, any transcriptional errors that may result from this process are unintentional.  Patient: [name] Type: Established DOB: May 19, 1980 MRN: [id number] PCP: McDiarmid, Shirley BIRCH, MD  Service: Procedure DOS: 04/11/2024 Setting: Ambulatory Location: Ambulatory outpatient facility Delivery: Face-to-face Provider: [name], MD Specialty: Interventional Pain Management Specialty designation: 09 Location: Outpatient facility Ref. Prov.: Sherry Wallie, MD       Interventional Therapy   Type: Cervical Epidural Steroid injection (CESI) (Interlaminar) #2  Laterality: Left (-LT)  Level: C7-T1 DOS: 04/11/2024  Provider: [name], MD Imaging: Fluoroscopy-guided Spinal ([id number]) Anesthesia: Local anesthesia (1-2% Lidocaine ) Anxiolysis: IV Versed   Medical Necessity Purpose: Therapeutic Rationale (medical necessity): procedure needed and proper for the diagnosis and/or treatment of [name]'s medical symptoms and needs. Indications: Cervicalgia, cervical radicular pain, degenerative disc disease, severe enough to impact quality of life or function. 1. Cervical radicular pain   2. Lumbar facet joint syndrome    NAS-11 Pain score:   Pre-procedure: 8 /10   Post-procedure: 2/10     Position  Prep  Materials:  Location setting: Procedure suite Position: Prone, on modified reverse trendelenburg to facilitate breathing, with head in head-cradle. Pillows positioned under chest (below chin-level) with cervical spine flexed. Safety Precautions: Patient was assessed for positional comfort and pressure points before starting the procedure. Prepping solution: DuraPrep (Iodine Povacrylex [0.7% available iodine] and  Isopropyl Alcohol, 74% w/w) Prep Area: Entire  cervicothoracic region Approach: percutaneous, paramedial Intended target: Posterior cervical epidural space Materials Procedure:  Tray: Epidural Needle(s): Epidural (Tuohy) Qty: 1 Length: (90mm) 3.5-inch Gauge: 22G  H&P (Pre-op Assessment):  [name] is a 44 y.o. (year old), female patient, seen today for interventional treatment. She  has a past surgical history that includes Strabismus surgery (08/1999); Colonoscopy (06/2006); Esophagogastroduodenoscopy (06/2006); Tubal ligation (04/08/2007); Dilation and evacuation (10/14/2011); Shoulder arthroscopy (01/21/2012); Exam under anesthesia with manipulation of shoulder (01/21/2012); Laparoscopic bilateral salpingectomy (N/A, 03/01/2013); Muscle recession and resection (Right, 10/03/2013); Eye surgery (Left, 06/2021); Botox  injection (N/A, 03/29/2023); Cystoscopy (N/A, 03/29/2023); Cystoscopy with injection (N/A, 09/06/2023); and Radiofrequency ablation (02/01/2024). [name] has a current medication list which includes the following prescription(s): albuterol , amlodipine , cyclobenzaprine , halobetasol , ibuprofen , lidocaine , oxycodone , solifenacin, aerochamber mv, and zinc gluconate, and the following Facility-Administered Medications: lactated ringers . Her primarily concern today is the Neck Pain  Initial Vital Signs:  Pulse/HCG Rate: 98ECG Heart Rate: 87 Temp: 98.4 F (36.9 C) Resp: 18 BP: (!) 125/96 SpO2: 99 %  BMI: Estimated body mass index is 26.63 kg/m as calculated from the following:   Height as of this encounter: 5' 6 (1.676 m).   Weight as of this encounter: 165 lb (74.8 kg).  Risk Assessment: Allergies: Reviewed. She is allergic to azelastine  and azelastine  hcl.  Allergy Precautions: None required Coagulopathies: Reviewed. None identified.  Blood-thinner therapy: None at this time Active Infection(s): Reviewed. None identified. [name] is afebrile  Site Confirmation: Ms.  [name] was asked to confirm the procedure and laterality before marking the site Procedure checklist: Completed Consent: Before the procedure and under the influence of no sedative(s), amnesic(s), or anxiolytics, the patient was informed of the treatment options, risks and possible complications. To fulfill our ethical and legal obligations, as recommended by the American Medical Association's Code of Ethics, I have informed the patient of my clinical impression; the nature and  purpose of the treatment or procedure; the risks, benefits, and possible complications of the intervention; the alternatives, including doing nothing; the risk(s) and benefit(s) of the alternative treatment(s) or procedure(s); and the risk(s) and benefit(s) of doing nothing. The patient was provided information about the general risks and possible complications associated with the procedure. These may include, but are not limited to: failure to achieve desired goals, infection, bleeding, organ or nerve damage, allergic reactions, paralysis, and death. In addition, the patient was informed of those risks and complications associated to Spine-related procedures, such as failure to decrease pain; infection (i.e.: Meningitis, epidural or intraspinal abscess); bleeding (i.e.: epidural hematoma, subarachnoid hemorrhage, or any other type of intraspinal or peri-dural bleeding); organ or nerve damage (i.e.: Any type of peripheral nerve, nerve root, or spinal cord injury) with subsequent damage to sensory, motor, and/or autonomic systems, resulting in permanent pain, numbness, and/or weakness of one or several areas of the body; allergic reactions; (i.e.: anaphylactic reaction); and/or death. Furthermore, the patient was informed of those risks and complications associated with the medications. These include, but are not limited to: allergic reactions (i.e.: anaphylactic or anaphylactoid reaction(s)); adrenal axis suppression; blood sugar  elevation that in diabetics may result in ketoacidosis or comma; water  retention that in patients with history of congestive heart failure may result in shortness of breath, pulmonary edema, and decompensation with resultant heart failure; weight gain; swelling or edema; medication-induced neural toxicity; particulate matter embolism and blood vessel occlusion with resultant organ, and/or nervous system infarction; and/or aseptic necrosis of one or more joints. Finally, the patient was informed that Medicine is not an exact science; therefore, there is also the possibility of unforeseen or unpredictable risks and/or possible complications that may result in a catastrophic outcome. The patient indicated having understood very clearly. We have given the patient no guarantees and we have made no promises. Enough time was given to the patient to ask questions, all of which were answered to the patient's satisfaction. [name] has indicated that she wanted to continue with the procedure. Attestation: I, the ordering provider, attest that I have discussed with the patient the benefits, risks, side-effects, alternatives, likelihood of achieving goals, and potential problems during recovery for the procedure that I have provided informed consent. Date  Time: 04/11/2024  2:00 PM  Pre-Procedure Preparation:  Monitoring: As per clinic protocol. Respiration, ETCO2, SpO2, BP, heart rate and rhythm monitor placed and checked for adequate function Safety Precautions: Patient was assessed for positional comfort and pressure points before starting the procedure. Time-out: I initiated and conducted the Time-out before starting the procedure, as per protocol. The patient was asked to participate by confirming the accuracy of the Time Out information. Verification of the correct person, site, and procedure were performed and confirmed by me, the nursing staff, and the patient. Time-out conducted as per Joint Commission's  Universal Protocol (UP.01.01.01). Time: 1432 Start Time: 1433 hrs.  Description  Narrative of Procedure:          Rationale (medical necessity): procedure needed and proper for the diagnosis and/or treatment of the patient's medical symptoms and needs. Start Time: 1433 hrs. Safety Precautions: Aspiration looking for blood return was conducted prior to all injections. At no point did we inject any substances, as a needle was being advanced. No attempts were made at seeking any paresthesias. Safe injection practices and needle disposal techniques used. Medications properly checked for expiration dates. SDV (single dose vial) medications used. Description of procedure: Protocol guidelines were followed. The patient  was assisted into a comfortable position. The target area was identified and the area prepped in the usual manner. Skin & deeper tissues infiltrated with local anesthetic. Appropriate amount of time allowed to pass for local anesthetics to take effect. Using fluoroscopic guidance, the epidural needle was introduced through the skin, ipsilateral to the reported pain, and advanced to the target area. Posterior laminar os was contacted and the needle walked caudad, until the lamina was cleared. The ligamentum flavum was engaged and the epidural space identified using loss-of-resistance technique with 2-3 ml of PF-NaCl (0.9% NSS), in a 5cc dedicated LOR syringe. (See Imaging guidance below for use of contrast details.) Once proper needle placement was secured, and negative aspiration confirmed, the solution was injected in intermittent fashion, asking for systemic symptoms every 0.5cc. The needles were then removed and the area cleansed, making sure to leave some of the prepping solution back to take advantage of its long term bactericidal properties.  Vitals:   04/11/24 1358 04/11/24 1430 04/11/24 1436 04/11/24 1446  BP: (!) 125/96 112/84 110/82 (!) 130/94  Pulse: 98     Resp:  18 18   Temp:  98.4 F (36.9 C)     SpO2: 99% 98% 97% 100%  Weight: 165 lb (74.8 kg)     Height: 5' 6 (1.676 m)        End Time: 1436 hrs.  Imaging Guidance (Spinal):          Type of Imaging Technique: Fluoroscopy Guidance (Spinal) Indication(s): Fluoroscopy guidance for needle placement to enhance accuracy in procedures requiring precise needle localization for targeted delivery of medication in or near specific anatomical locations not easily accessible without such real-time imaging assistance. Exposure Time: Please see nurses notes. Contrast: Before injecting any contrast, we confirmed that the patient did not have an allergy to iodine, shellfish, or radiological contrast. Once satisfactory needle placement was completed at the desired level, radiological contrast was injected. Contrast injected under live fluoroscopy. No contrast complications. See chart for type and volume of contrast used. Fluoroscopic Guidance: I was personally present during the use of fluoroscopy. Tunnel Vision Technique used to obtain the best possible view of the target area. Parallax error corrected before commencing the procedure. Direction-depth-direction technique used to introduce the needle under continuous pulsed fluoroscopy. Once target was reached, antero-posterior, oblique, and lateral fluoroscopic projection used confirm needle placement in all planes. Images permanently stored in EMR. Interpretation: I personally interpreted the imaging intraoperatively. Adequate needle placement confirmed in multiple planes. Appropriate spread of contrast into desired area was observed. No evidence of afferent or efferent intravascular uptake. No intrathecal or subarachnoid spread observed. Permanent images saved into the patient's record.  Post-operative Assessment:  Post-procedure Vital Signs:  Pulse/HCG Rate: 9888 Temp: 98.4 F (36.9 C) Resp: 18 BP: (!) 130/94 SpO2: 100 %  EBL: None  Complications: No immediate  post-treatment complications observed by team, or reported by patient.  Note: The patient tolerated the entire procedure well. A repeat set of vitals were taken after the procedure and the patient was kept under observation following institutional policy, for this type of procedure. Post-procedural neurological assessment was performed, showing return to baseline, prior to discharge. The patient was provided with post-procedure discharge instructions, including a section on how to identify potential problems. Should any problems arise concerning this procedure, the patient was given instructions to immediately contact us , at any time, without hesitation. In any case, we plan to contact the patient by telephone for a follow-up status report regarding  this interventional procedure.  Comments:  No additional relevant information.  Plan of Care (POC)  Orders:  Orders Placed This Encounter  Procedures   DG PAIN CLINIC C-ARM 1-60 MIN NO REPORT    Intraoperative interpretation by procedural physician at [facility].    Standing Status:   Standing    Number of Occurrences:   1    Reason for exam::   Assistance in needle guidance and placement for procedures requiring needle placement in or near specific anatomical locations not easily accessible without such assistance.   5 out of 5 strength bilateral upper extremity: Shoulder abduction, elbow flexion, elbow extension, thumb extension.   Medications ordered for procedure: Meds ordered this encounter  Medications   iohexol  (OMNIPAQUE ) 180 MG/ML injection 10 mL    Must be Myelogram-compatible. If not available, you may substitute with a water -soluble, non-ionic, hypoallergenic, myelogram-compatible radiological contrast medium.   lidocaine  (XYLOCAINE ) 2 % (with pres) injection 400 mg   lactated ringers  infusion   midazolam  PF (VERSED ) injection 0.5-2 mg    Make sure Flumazenil is available in the pyxis when using this medication. If  oversedation occurs, administer 0.2 mg IV over 15 sec. If after 45 sec no response, administer 0.2 mg again over 1 min; may repeat at 1 min intervals; not to exceed 4 doses (1 mg)   ropivacaine  (PF) 2 mg/mL (0.2%) (NAROPIN ) injection 1 mL   sodium chloride  flush (NS) 0.9 % injection 1 mL   dexamethasone  (DECADRON ) injection 10 mg   Medications administered: We administered iohexol , lidocaine , lactated ringers , midazolam  PF, ropivacaine  (PF) 2 mg/mL (0.2%), sodium chloride  flush, and dexamethasone .  See the medical record for exact dosing, route, and time of administration.    Left L3,4,5 RFA 01/27/22  C-ESI (left) 01/02/24, 04/11/24    Follow-up plan:   Return in about 6 weeks (around 05/23/2024) for F2F PPE, 4-6 weeks.     Recent Visits Date Type Provider Dept  03/29/24 Office Visit Marcelino Nurse, MD [facility]  02/01/24 Procedure visit Marcelino Nurse, MD [facility]  Showing recent visits within past 90 days and meeting all other requirements Today's Visits Date Type Provider Dept  04/11/24 Procedure visit Marcelino Nurse, MD [facility]  Showing today's visits and meeting all other requirements Future Appointments Date Type Provider Dept  05/10/24 Appointment Marcelino Nurse, MD [facility]  Showing future appointments within next 90 days and meeting all other requirements   Disposition: Discharge home  Discharge (Date  Time): 04/11/2024; 1447 hrs.   Primary Care Physician: McDiarmid, Shirley BIRCH, MD Location: [facility] Note by: Nurse Marcelino, MD (TTS technology used. I apologize for any typographical errors that were not detected and corrected.) Date: 04/11/2024; Time: 3:00 PM  Disclaimer:  Medicine is not an visual merchandiser. The only guarantee in medicine is that nothing is guaranteed. It is important to note that the decision to proceed with this intervention was based on the information collected from the patient.  The Data and conclusions were drawn from the patient's questionnaire, the interview, and the physical examination. Because the information was provided in large part by the patient, it cannot be guaranteed that it has not been purposely or unconsciously manipulated. Every effort has been made to obtain as much relevant data as possible for this evaluation. It is important to note that the conclusions that lead to this procedure are derived in large part from the available data. Always take into account that the  treatment will also be dependent on availability of resources and existing treatment guidelines, considered by other Pain Management Practitioners as being common knowledge and practice, at the time of the intervention. For Medico-Legal purposes, it is also important to point out that variation in procedural techniques and pharmacological choices are the acceptable norm. The indications, contraindications, technique, and results of the above procedure should only be interpreted and judged by a Board-Certified Interventional Pain Specialist with extensive familiarity and expertise in the same exact procedure and technique.

## 2024-04-11 NOTE — Progress Notes (Signed)
 Safety precautions to be maintained throughout the outpatient stay will include: orient to surroundings, keep bed in low position, maintain call bell within reach at all times, provide assistance with transfer out of bed and ambulation.

## 2024-04-12 ENCOUNTER — Telehealth: Payer: Self-pay

## 2024-04-12 ENCOUNTER — Telehealth: Payer: Self-pay | Admitting: Student in an Organized Health Care Education/Training Program

## 2024-04-12 NOTE — Telephone Encounter (Signed)
 Post procedure follow up.  Patient states is doing good

## 2024-04-12 NOTE — Telephone Encounter (Signed)
 Pt had papers from her job faxed in and the deadline is 04/19/24

## 2024-04-12 NOTE — Telephone Encounter (Signed)
 FMLA forms faxed. Patient informed.

## 2024-04-19 ENCOUNTER — Ambulatory Visit

## 2024-04-19 ENCOUNTER — Telehealth: Payer: Self-pay

## 2024-04-19 VITALS — BP 151/105 | HR 88 | Ht 66.0 in | Wt 161.4 lb

## 2024-04-19 DIAGNOSIS — I10 Essential (primary) hypertension: Secondary | ICD-10-CM

## 2024-04-19 DIAGNOSIS — M5412 Radiculopathy, cervical region: Secondary | ICD-10-CM

## 2024-04-19 DIAGNOSIS — M25561 Pain in right knee: Secondary | ICD-10-CM

## 2024-04-19 MED ORDER — GABAPENTIN 600 MG PO TABS: ORAL_TABLET | ORAL | 0 refills | Status: AC

## 2024-04-19 MED ORDER — DICLOFENAC SODIUM 1 % EX GEL: 2.0000 g | Freq: Four times a day (QID) | CUTANEOUS | 1 refills | Status: AC | PRN

## 2024-04-19 MED ORDER — ZIKS ARTHRITIS PAIN RELIEF 0.025-1-12 % EX CREA: 1.0000 | TOPICAL_CREAM | Freq: Four times a day (QID) | CUTANEOUS | 2 refills | Status: AC | PRN

## 2024-04-19 NOTE — Patient Instructions (Addendum)
 Thank you for visiting the clinic today, it was good to see you!  Please always bring your medication bottles  In today's visit we discussed:  Neck pain: Because of your recent procedure, you will need to call the provider that performed the procedure as he will need to determine what management he would prefer given your worsening pain. I have also sent in the gabapentin  prescription for you to start taking 600 mg twice a day for 7 days, after 7 days if you feel that your pain is improved slightly increased to 600 mg 3 times a day.  This medication may make you feel sleepy so if that is the case let us  know and we can reduce the dose. Strive to improve posture with adjusting your seat to have your neck in neutral position when looking at screen as well as using back support brace that you can get over-the-counter from any pharmacy.  Knee pain: I suspect this is something called prepatellar bursitis, the best treatment is for you to rest the knee, wrapped the knee with Ace bandages, ice the knee, and apply Voltaren  gel and capsaicin cream to the affected area.  You can apply these every 6 hours, start with the Voltaren  gel every 6 hours.  If you still have pain then you can apply the capsaicin cream staggered with the Voltaren  gel.  Please follow-up in 3-4 weeks  For any questions, please call the office at [phone] or send me a message in MyChart. Have a great day!  -Fairy Amy, MD  [facility] Resident, PGY-1

## 2024-04-19 NOTE — Telephone Encounter (Signed)
 Received VM from patient's mother requesting that we call patient regarding severe pain that she is experiencing.   Called patient back at number provided, she did not answer, VM was left for patient to call back.   Chiquita JAYSON English, RN

## 2024-04-19 NOTE — Progress Notes (Signed)
 "   SUBJECTIVE:   CHIEF COMPLAINT / HPI:   Follows with pain medicine through [facility] interventional pain management specialists at [facility].  Cervical radicular pain: S/p left interlaminar epidural steroid injection 04/11/2024. Reports that she was feeling fine after the procedure, but this week pain has returned and is 8-9/10 again when it hits, flares come out of the blue. 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 Is scheduled to see neurosurgery 2/24 with Dr. Clois. Was previously prescribed gabapentin  600 mg TID which she has been taking in the mornings which has been helping. Gets most spasms around 3 pm.  Right knee pain: Started this morning with pain and swelling, 9/10 sharp suprapatellar pain with movement and when weight bearing, had the knee drained once by Dr. McDiarmid years ago. Has noticed more squeaking and creaking of the knee but no instability. No erythema, fevers, chills, flu like symptoms. No history of knee surgery. No falls or injuries. Denies any fecal incontinence or worsening urinary incontinence following cervical procedure or worsening numbness/tingling/weakness of bilateral upper extremities.  HTN: Has been compliant with amlodipine , no dizziness, lightheadedness, changes in vision.  PERTINENT  PMH / PSH: S/p bilateral radiofrequency ablation of L3-L4 and L4-L5 lumbar facets 03/29/2024, hypertension, GERD, asthma, GAD  OBJECTIVE:   BP (!) 151/105   Pulse 88   Ht 5' 6 (1.676 m)   Wt 161 lb 6.4 oz (73.2 kg)   LMP 09/05/2016 Comment: tubal ligation  SpO2 100%   BMI 26.05 kg/m    Cardiac: Regular rate and rhythm. Normal S1/S2. No murmurs, rubs, or gallops appreciated. Lungs: Clear bilaterally to ascultation.  Abdomen: Normoactive bowel sounds. No tenderness to deep or light palpation. No rebound or guarding.  Right knee: No bony abnormalities, severe joint line tenderness of superior  patella and with compression of patella, no edema, effusion, redness or crepitus. Normal internal rotation, external rotation, flexion and extension. No joint laxity. Left knee: No bony abnormalities, joint line tenderness, edema, effusion, redness or crepitus. Normal internal rotation, external rotation, flexion and extension. No joint laxity. Psych: Pleasant and appropriate    ASSESSMENT/PLAN:   Assessment & Plan Cervical radicular pain Already received extensive workup, s/p cervical left interlaminar epidural steroid injection 04/11/2024 with Dr. Marcelino with [place] [facility].  No red flag symptoms of abscess or hematoma formation, though patient does have exquisite tenderness to palpation with mild swelling of right paraspinal area around T2.  Gave strict ED return precautions, primarily suspect this may be related to returning to work and worsening posture/worsening of chronic pain syndrome. Increase gabapentin  600 mg daily to twice daily for 7 days, if noticeable improvement can increase to 600 mg 3 times daily.  Provided instruction to reduce dose by half if noticing significant sedation Strongly urged patient to contact DrRONITA Kato office for determination of further management and diagnostics imaging Acute pain of right knee Acute onset this morning, exquisite tenderness on physical exam, suspect prepatellar bursitis versus chronic pain syndrome. Compression and ice with use, rest knee for 7 to 14 days Prescribed Voltaren  gel and capsaicin cream, instructed patient to start applying Voltaren  gel if still no noticeable improvement to stagger with capsaicin cream. Benign essential hypertension Repeatedly elevated diastolic blood pressures today in clinic and at other previous clinic visits, difficult given patient's poorly controlled chronic pain.  Spoke with Dr. Ruthann and will continue to find an optimal pain regiment prior  to ambulatory monitoring. Scheduled with Dr.  Ruthann for ambulatory blood pressure monitoring 05/15/2024     Shirley Amy, MD [facility] "

## 2024-04-19 NOTE — Assessment & Plan Note (Signed)
 Repeatedly elevated diastolic blood pressures today in clinic and at other previous clinic visits, difficult given patient's poorly controlled chronic pain.  Spoke with Dr. Kobal and will continue to find an optimal pain regiment prior to ambulatory monitoring. Scheduled with Dr. Ruthann for ambulatory blood pressure monitoring 05/15/2024

## 2024-04-19 NOTE — Assessment & Plan Note (Signed)
 Already received extensive workup, s/p cervical left interlaminar epidural steroid injection 04/11/2024 with Dr. Marcelino with [facility].  No red flag symptoms of abscess or hematoma formation, though patient does have exquisite tenderness to palpation with mild swelling of right paraspinal area around T2.  Gave strict ED return precautions, primarily suspect this may be related to returning to work and worsening posture/worsening of chronic pain syndrome. Increase gabapentin  600 mg daily to twice daily for 7 days, if noticeable improvement can increase to 600 mg 3 times daily.  Provided instruction to reduce dose by half if noticing significant sedation Strongly urged patient to contact DrRONITA Kato office for determination of further management and diagnostics imaging

## 2024-04-19 NOTE — Telephone Encounter (Signed)
 Patient returns call to nurse line.   She reports 8/10 back and knee pain. She reports she had an epidural in January. However, reports she has had intense pain the last week or so. She reports, I'm not sure if its the weather.   She denies any aggravating factors, no new trauma.   She reports she has been taking Gabapentin  with minimal relief.   Patient scheduled for this afternoon for evaluation.

## 2024-04-20 ENCOUNTER — Telehealth: Payer: Self-pay | Admitting: Student in an Organized Health Care Education/Training Program

## 2024-04-20 NOTE — Telephone Encounter (Signed)
 Pt stated that she is still in pain. She went to primary Dr and was advised by them to come here.

## 2024-04-20 NOTE — Telephone Encounter (Signed)
 Attempted to return phone call, voicemail left.

## 2024-04-26 ENCOUNTER — Encounter: Admitting: Family Medicine

## 2024-05-08 ENCOUNTER — Ambulatory Visit: Admitting: Neurosurgery

## 2024-05-10 ENCOUNTER — Ambulatory Visit: Admitting: Student in an Organized Health Care Education/Training Program

## 2024-05-15 ENCOUNTER — Ambulatory Visit: Admitting: Pharmacist

## 2024-06-04 ENCOUNTER — Ambulatory Visit (HOSPITAL_COMMUNITY): Payer: Self-pay | Admitting: Psychiatry
